# Patient Record
Sex: Female | Born: 1955 | Race: White | Hispanic: No | Smoking: Former smoker
Health system: Southern US, Community
[De-identification: ages and names within clinical notes are randomized; demographics above are authoritative.]

## PROBLEM LIST (undated history)

## (undated) DIAGNOSIS — R9431 Abnormal electrocardiogram [ECG] [EKG]: Secondary | ICD-10-CM

## (undated) DIAGNOSIS — R52 Pain, unspecified: Secondary | ICD-10-CM

## (undated) DIAGNOSIS — G9341 Metabolic encephalopathy: Secondary | ICD-10-CM

## (undated) DIAGNOSIS — K7460 Unspecified cirrhosis of liver: Secondary | ICD-10-CM

## (undated) DIAGNOSIS — G8929 Other chronic pain: Secondary | ICD-10-CM

## (undated) DIAGNOSIS — E46 Unspecified protein-calorie malnutrition: Secondary | ICD-10-CM

## (undated) DIAGNOSIS — K828 Other specified diseases of gallbladder: Secondary | ICD-10-CM

## (undated) DIAGNOSIS — E041 Nontoxic single thyroid nodule: Secondary | ICD-10-CM

## (undated) DIAGNOSIS — M1990 Unspecified osteoarthritis, unspecified site: Secondary | ICD-10-CM

## (undated) HISTORY — DX: Nontoxic single thyroid nodule: E04.1

## (undated) HISTORY — DX: Unspecified cirrhosis of liver: K74.60

## (undated) HISTORY — PX: WRIST SURGERY: SHX841

## (undated) HISTORY — PX: HERNIA REPAIR: SHX51

## (undated) HISTORY — DX: Other chronic pain: G89.29

## (undated) HISTORY — DX: Metabolic encephalopathy: G93.41

## (undated) HISTORY — DX: Abnormal electrocardiogram (ECG) (EKG): R94.31

## (undated) HISTORY — DX: Unspecified protein-calorie malnutrition: E46

## (undated) HISTORY — PX: BACK SURGERY: SHX140

## (undated) HISTORY — DX: Other specified diseases of gallbladder: K82.8

## (undated) HISTORY — PX: OTHER SURGICAL HISTORY: SHX169

---

## 2006-01-29 ENCOUNTER — Emergency Department (HOSPITAL_COMMUNITY): Admission: EM | Admit: 2006-01-29 | Discharge: 2006-01-29 | Payer: Self-pay | Admitting: Emergency Medicine

## 2006-02-03 ENCOUNTER — Emergency Department (HOSPITAL_COMMUNITY): Admission: EM | Admit: 2006-02-03 | Discharge: 2006-02-03 | Payer: Self-pay | Admitting: Emergency Medicine

## 2006-03-18 ENCOUNTER — Ambulatory Visit: Payer: Self-pay | Admitting: Internal Medicine

## 2006-03-28 ENCOUNTER — Ambulatory Visit: Payer: Self-pay | Admitting: *Deleted

## 2006-07-25 ENCOUNTER — Ambulatory Visit: Payer: Self-pay | Admitting: Internal Medicine

## 2006-09-29 ENCOUNTER — Ambulatory Visit: Payer: Self-pay | Admitting: Internal Medicine

## 2006-12-17 ENCOUNTER — Encounter (INDEPENDENT_AMBULATORY_CARE_PROVIDER_SITE_OTHER): Payer: Self-pay | Admitting: *Deleted

## 2007-03-24 ENCOUNTER — Ambulatory Visit: Payer: Self-pay | Admitting: Internal Medicine

## 2007-03-24 LAB — CONVERTED CEMR LAB
ALT: 36 units/L — ABNORMAL HIGH (ref 0–35)
Basophils Absolute: 0 10*3/uL (ref 0.0–0.1)
CO2: 26 meq/L (ref 19–32)
Calcium: 9.5 mg/dL (ref 8.4–10.5)
Chloride: 106 meq/L (ref 96–112)
Cholesterol: 168 mg/dL (ref 0–200)
HCV Genotype: 3
HCV Quantitative: 234000 intl units/mL — ABNORMAL HIGH (ref <5)
Hemoglobin: 14.6 g/dL (ref 12.0–15.0)
Lymphocytes Relative: 29 % (ref 12–46)
Neutro Abs: 4.6 10*3/uL (ref 1.7–7.7)
Neutrophils Relative %: 64 % (ref 43–77)
Platelets: 220 10*3/uL (ref 150–400)
Potassium: 4.2 meq/L (ref 3.5–5.3)
RDW: 12 % (ref 11.5–15.5)
Sodium: 143 meq/L (ref 135–145)
TSH: 0.327 microintl units/mL — ABNORMAL LOW (ref 0.350–5.50)
Total Protein: 7.6 g/dL (ref 6.0–8.3)

## 2007-04-23 ENCOUNTER — Ambulatory Visit: Payer: Self-pay | Admitting: Internal Medicine

## 2007-09-09 ENCOUNTER — Ambulatory Visit: Payer: Self-pay | Admitting: Internal Medicine

## 2007-10-28 ENCOUNTER — Ambulatory Visit: Payer: Self-pay | Admitting: Internal Medicine

## 2007-10-28 ENCOUNTER — Encounter: Payer: Self-pay | Admitting: Internal Medicine

## 2008-01-07 ENCOUNTER — Ambulatory Visit: Payer: Self-pay | Admitting: Internal Medicine

## 2008-02-04 ENCOUNTER — Ambulatory Visit: Payer: Self-pay | Admitting: Internal Medicine

## 2008-02-19 ENCOUNTER — Ambulatory Visit (HOSPITAL_COMMUNITY): Admission: RE | Admit: 2008-02-19 | Discharge: 2008-02-19 | Payer: Self-pay | Admitting: Internal Medicine

## 2008-07-27 ENCOUNTER — Ambulatory Visit: Payer: Self-pay | Admitting: Internal Medicine

## 2008-07-29 ENCOUNTER — Ambulatory Visit (HOSPITAL_COMMUNITY): Admission: RE | Admit: 2008-07-29 | Discharge: 2008-07-29 | Payer: Self-pay | Admitting: Internal Medicine

## 2008-08-28 ENCOUNTER — Emergency Department (HOSPITAL_COMMUNITY): Admission: EM | Admit: 2008-08-28 | Discharge: 2008-08-29 | Payer: Self-pay | Admitting: Emergency Medicine

## 2008-08-31 ENCOUNTER — Ambulatory Visit: Payer: Self-pay | Admitting: Internal Medicine

## 2008-12-21 ENCOUNTER — Ambulatory Visit: Payer: Self-pay | Admitting: Internal Medicine

## 2008-12-28 ENCOUNTER — Encounter: Admission: RE | Admit: 2008-12-28 | Discharge: 2008-12-28 | Payer: Self-pay | Admitting: Internal Medicine

## 2009-02-09 ENCOUNTER — Ambulatory Visit: Payer: Self-pay | Admitting: Internal Medicine

## 2009-05-10 ENCOUNTER — Ambulatory Visit: Payer: Self-pay | Admitting: Family Medicine

## 2009-08-08 ENCOUNTER — Ambulatory Visit: Payer: Self-pay | Admitting: Internal Medicine

## 2009-09-15 ENCOUNTER — Ambulatory Visit: Payer: Self-pay | Admitting: Internal Medicine

## 2009-10-16 ENCOUNTER — Ambulatory Visit: Payer: Self-pay | Admitting: Internal Medicine

## 2009-10-16 LAB — CONVERTED CEMR LAB
ALT: 38 units/L — ABNORMAL HIGH (ref 0–35)
Albumin: 5 g/dL (ref 3.5–5.2)
Alcohol, Ethyl (B): <10 mg/dL (ref 0–10)
Basophils Absolute: 0 10*3/uL (ref 0.0–0.1)
CO2: 28 meq/L (ref 19–32)
Calcium: 9.7 mg/dL (ref 8.4–10.5)
Chloride: 98 meq/L (ref 96–112)
Cholesterol: 154 mg/dL (ref 0–200)
Creatinine, Ser: 0.63 mg/dL (ref 0.40–1.20)
Hemoglobin: 15.9 g/dL — ABNORMAL HIGH (ref 12.0–15.0)
Lymphocytes Relative: 18 % (ref 12–46)
Monocytes Absolute: 0.6 10*3/uL (ref 0.1–1.0)
Neutro Abs: 6.6 10*3/uL (ref 1.7–7.7)
Neutrophils Relative %: 73 % (ref 43–77)
Potassium: 4.6 meq/L (ref 3.5–5.3)
RBC: 5.01 M/uL (ref 3.87–5.11)
RDW: 12.8 % (ref 11.5–15.5)
Total CHOL/HDL Ratio: 2.5
Total Protein: 8 g/dL (ref 6.0–8.3)

## 2009-10-26 ENCOUNTER — Encounter (INDEPENDENT_AMBULATORY_CARE_PROVIDER_SITE_OTHER): Payer: Self-pay | Admitting: Internal Medicine

## 2009-10-26 LAB — CONVERTED CEMR LAB
Amphetamine Screen, Ur: NEGATIVE
Benzodiazepines.: NEGATIVE
Cocaine Metabolites: NEGATIVE
Marijuana Metabolite: NEGATIVE
Phencyclidine (PCP): NEGATIVE

## 2009-11-03 ENCOUNTER — Ambulatory Visit: Payer: Self-pay | Admitting: Internal Medicine

## 2009-12-25 ENCOUNTER — Encounter
Admission: RE | Admit: 2009-12-25 | Discharge: 2010-03-25 | Payer: Self-pay | Source: Home / Self Care | Attending: Physical Medicine and Rehabilitation | Admitting: Physical Medicine and Rehabilitation

## 2010-01-01 ENCOUNTER — Ambulatory Visit: Payer: Self-pay | Admitting: Physical Medicine and Rehabilitation

## 2010-01-15 ENCOUNTER — Ambulatory Visit (HOSPITAL_COMMUNITY): Admission: RE | Admit: 2010-01-15 | Discharge: 2010-01-15 | Payer: Self-pay | Admitting: Obstetrics and Gynecology

## 2010-01-29 ENCOUNTER — Ambulatory Visit: Payer: Self-pay | Admitting: Physical Medicine and Rehabilitation

## 2010-02-15 ENCOUNTER — Ambulatory Visit: Payer: Self-pay | Admitting: Gastroenterology

## 2010-05-25 ENCOUNTER — Encounter (HOSPITAL_COMMUNITY)
Admission: RE | Admit: 2010-05-25 | Discharge: 2010-05-25 | Disposition: A | Discharge status: Home / Self Care | Payer: Medicaid Other | Source: Ambulatory Visit | Attending: Orthopedic Surgery | Admitting: Orthopedic Surgery

## 2010-05-25 ENCOUNTER — Other Ambulatory Visit (HOSPITAL_COMMUNITY): Payer: Self-pay | Admitting: Orthopedic Surgery

## 2010-05-25 DIAGNOSIS — Z01810 Encounter for preprocedural cardiovascular examination: Secondary | ICD-10-CM | POA: Insufficient documentation

## 2010-05-25 DIAGNOSIS — Z01812 Encounter for preprocedural laboratory examination: Secondary | ICD-10-CM | POA: Insufficient documentation

## 2010-05-25 DIAGNOSIS — Z01818 Encounter for other preprocedural examination: Secondary | ICD-10-CM | POA: Insufficient documentation

## 2010-05-25 DIAGNOSIS — Z01811 Encounter for preprocedural respiratory examination: Secondary | ICD-10-CM

## 2010-05-25 LAB — SURGICAL PCR SCREEN
MRSA, PCR: NEGATIVE
Staphylococcus aureus: NEGATIVE

## 2010-05-25 LAB — COMPREHENSIVE METABOLIC PANEL
Albumin: 4.4 g/dL (ref 3.5–5.2)
BUN: 4 mg/dL — ABNORMAL LOW (ref 6–23)
Calcium: 9.6 mg/dL (ref 8.4–10.5)
Glucose, Bld: 86 mg/dL (ref 70–99)
Potassium: 4.4 mEq/L (ref 3.5–5.1)
Sodium: 141 mEq/L (ref 135–145)
Total Protein: 7.5 g/dL (ref 6.0–8.3)

## 2010-05-25 LAB — URINALYSIS, ROUTINE W REFLEX MICROSCOPIC
Hgb urine dipstick: NEGATIVE
Ketones, ur: NEGATIVE mg/dL
Protein, ur: NEGATIVE mg/dL
Urine Glucose, Fasting: NEGATIVE mg/dL
pH: 7 (ref 5.0–8.0)

## 2010-05-25 LAB — CBC
HCT: 46 % (ref 36.0–46.0)
Hemoglobin: 16.2 g/dL — ABNORMAL HIGH (ref 12.0–15.0)
MCH: 31.8 pg (ref 26.0–34.0)
MCHC: 35.2 g/dL (ref 30.0–36.0)
RBC: 5.1 MIL/uL (ref 3.87–5.11)

## 2010-05-25 LAB — URINE MICROSCOPIC-ADD ON

## 2010-05-25 LAB — ABO/RH: ABO/RH(D): B POS

## 2010-05-25 LAB — DIFFERENTIAL
Lymphocytes Relative: 34 % (ref 12–46)
Lymphs Abs: 2.9 10*3/uL (ref 0.7–4.0)
Monocytes Absolute: 0.5 10*3/uL (ref 0.1–1.0)
Monocytes Relative: 6 % (ref 3–12)
Neutro Abs: 4.7 10*3/uL (ref 1.7–7.7)
Neutrophils Relative %: 56 % (ref 43–77)

## 2010-05-25 LAB — PROTIME-INR
INR: 0.89 (ref 0.00–1.49)
Prothrombin Time: 12.3 seconds (ref 11.6–15.2)

## 2010-05-25 LAB — TYPE AND SCREEN
ABO/RH(D): B POS
Antibody Screen: NEGATIVE

## 2010-05-25 LAB — APTT: aPTT: 28 seconds (ref 24–37)

## 2010-05-30 ENCOUNTER — Inpatient Hospital Stay (HOSPITAL_COMMUNITY): Payer: Medicaid Other

## 2010-05-30 ENCOUNTER — Inpatient Hospital Stay (HOSPITAL_COMMUNITY)
Admission: RE | Admit: 2010-05-30 | Discharge: 2010-06-01 | DRG: 460 | Disposition: A | Discharge status: Home / Self Care | Payer: Medicaid Other | Source: Ambulatory Visit | Attending: Orthopedic Surgery | Admitting: Orthopedic Surgery

## 2010-05-30 DIAGNOSIS — M48061 Spinal stenosis, lumbar region without neurogenic claudication: Principal | ICD-10-CM | POA: Diagnosis present

## 2010-06-01 NOTE — Op Note (Signed)
NAME:  Anita Price, Anita Price                ACCOUNT NO.:  [id number]  MEDICAL RECORD NO.:  [id number]           PATIENT TYPE:  I  LOCATION:  5023                         FACILITY:  [facility]  PHYSICIAN:  Estill Bamberg, MD      DATE OF BIRTH:  1955/08/09  DATE OF PROCEDURE:  05/30/2010 DATE OF DISCHARGE:                              OPERATIVE REPORT   PREOPERATIVE DIAGNOSES: 1. L4-5 spinal stenosis. 2. 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  POSTOPERATIVE DIAGNOSES: 1. L4-5 spinal stenosis. 2. 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  PROCEDURES: 1. L4-5 laminectomy, facetectomy, and foraminotomy with bilateral     decompression. 2. Posterolateral arthrodesis, L4-5. 3. Insertion of posterior nonsegmental instrumentation at L4-5. 4. Use of local autograft. 5. Intraoperative bone marrow aspiration.  SURGEON:  Estill Bamberg, MD  ASSISTANT:  None.  ANESTHESIA:  General endotracheal anesthesia.  ESTIMATED BLOOD LOSS:  250 mL.  COMPLICATIONS:  None.  DISPOSITION:  Stable.  FINDINGS:  L4-5 spinal stenosis and instability.  INSTRUMENTATION USED:  DePuy Expedium 5.5 instrumentation with 7 x 45-mm screws.  INDICATIONS FOR PROCEDURE:  Ms. Anita Price is a pleasant 55 year old female who  presented to my office with right greater than left leg in addition of back pain.  We did go forward with extensive conservative treatment plan including the physical therapy, anti-inflammatories, and addition of pain medications and epidural injections.  Of note, at the time that the patient did state that her injection did help her for approximately 2 days but her pain quickly recurred.  I did review an MRI dated April 04, 2010, which was consistent with multilevel degenerative disk disease in addition to spinal stenosis identified at the L4-5 level.  Radiographs were consistent with an angular deformity and a lateral listhesis across the L4-5 level which was diagnostic for instability.  Therefore, I had discussion with the patient  regarding going forward with an L4-5 decompression with instrumented posterolateral fusion.  The patient understood the risks and limitations of the procedure as outlined in my preoperative note.  OPERATIVE DETAILS:  On May 30, 2010, the patient was brought to Surgery, and general endotracheal anesthesia was administered.  The patient was given Ancef for antibiotic prophylaxis.  SCDs were placed. Time-out procedure was performed prior to placing the patient prone on a well-padded Nestler spinal frame.  The back was then prepped and draped in the usual sterile fashion.  I did obtain a lateral intraoperative radiograph with two 18-gauge spinal needles over the midline of the lumbar spine to help optimize the location of my incision.  I then made an incision from approximately spinous process of L3 to approximately spinous process of L5.  I subperiosteally exposed the lamina of L4 and L5, and an additional lateral intraoperative radiograph was obtained to help confirm the appropriate operative level.  I then exposed the posterolateral gutters up to the transverse processes of L4 and L5.  The posterolateral gutters were then packed with a Ray-Tec soaked with thrombin.  I then turned my attention towards the decompression aspect of the procedure.  I did remove the inferior aspect of L4 and the superior aspect of L5 spinous processes.  I  then performed a central and lateral recess and foraminal decompression in the usual manner using a series of Kerrisons.  I did use meticulous care and I was able to ensure that there was no extravasation of cerebrospinal fluid noted throughout the entire procedure.  It should be noted that there was obvious stenosis identified at the L4-5 level and there was compression of the traversing L5 nerves noted.  At the termination of the decompression, I was easily able to pass a [facility] out of the neural foramen on both the left and the right sides.   It should be noted that these facet joints were noted to be very much hypertrophic and the hypertrophy of the facet joints was removed.  The autograft obtained from the decompression aspect of the procedure was placed on the back table and cleaned to be used as autograft for the fusion aspect of the procedure. The epidural spaces in both the right and the left sides were packed with FloSeal.  I then turned my attention towards the instrumentation aspect of the procedure.  I started on the patient's right side.  I did cannulate the pedicles of L4 and L5 first using a 4-mm high-speed bur, followed by a curved gearshift probe.  I did use a ball-tip probe to confirm that there was no cortical violation.  I then cannulated the holes on the contralateral side in the same manner.  It should be noted that there was obvious rotation of the L4 vertebral body.  It was noted to be rotated rather excessively towards the patient's left side.  I did take this into account when cannulating the holes.  At this point, I copiously irrigated the wound using approximately 1.5 liters of normal saline.  The retractors were relaxed every 30 minutes in order to help optimize the perfusion of the paraspinal musculature.  At this point, I did use a 4-mm high-speed bur to decorticate the transverse processes of L4 and L5 bilaterally.  The posterior elements of the facet joints were also decorticated.  At this point, I did obtain approximately 5 mL of bone marrow aspirate from the patient's left iliac crest.  This was mixed with a 5 mL aliquot of Vitoss.  The Vitoss bone marrow aspirate and autograft mixture was used and packed into the posterolateral gutters on both the right and the left sides.  L4 and L5 screws were placed bilaterally uneventfully.  I did place 7 x 45-mm screws.  Of note, there was excessive osteoporosis identified and therefore, I did undersize the tap with a 5-mm tap prior to placing the screws.   After placing into the screws, I did perform motor evoked EMG, and there was no motor evoked EMG potential less than 17 mA.  I was very happy with the purchase of each of the screws.  I then placed a 35-mm rod on the patient's right side and a 30-mm rod on the patient's left side.  Caps were placed and final tightening procedure was performed.  Again, I was very happy with the final construct.  I did get a lateral intraoperative radiograph to confirm that the appropriate levels were fused and that the instrumentation was in the appropriate position.  I was very happy with the final construct.  I then placed a drain deep to the fascia. The fascia was closed using #1 Vicryl.  The subcutaneous layer was closed using 2-0 Vicryl, and skin was closed using 3-0 Monocryl. Benzoin and Steri-Strips were applied.  I also  closed the incision site for the iliac crest bone marrow aspirate using Monocryl and Steri- Strips. A sterile dressing was applied, and the patient was rolled supine and transferred to recovery in stable condition.  Of note, neurologic monitoring was used throughout the procedure and there were no abnormal signals identified.  All instrument counts were correct at the termination of the procedure.     Estill Bamberg, MD     MD/MEDQ  D:  05/30/2010  T:  05/31/2010  Job:  [id number]  Electronically Signed by Estill Bamberg  on 06/01/2010 08:15:21 AM

## 2010-06-08 NOTE — Discharge Summary (Signed)
  NAME:  Anita Price, Anita Price                ACCOUNT NO.:  [id number]  MEDICAL RECORD NO.:  [id number]           PATIENT TYPE:  I  LOCATION:  5023                         FACILITY:  [facility]  PHYSICIAN:  Estill Bamberg, MD      DATE OF BIRTH:  04/17/1955  DATE OF ADMISSION:  05/30/2010 DATE OF DISCHARGE:  06/01/2010                              DISCHARGE SUMMARY   ADMISSION DIAGNOSES: 1. L4-5 spinal stenosis. 2. 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 3. Status post L4-5 laminectomy and instrumented posterolateral     fusion.  ADMITTING HISTORY:  Briefly, [name] is a pleasant 55 year old female who did present to my office with signs and symptoms associated with spinal stenosis.  The patient was treated conservatively, but continued to have bilateral leg pain with a right side being more affected than the left.  We therefore had a discussion regarding going forward with an L4-5 decompression and instrumented posterolateral fusion.  The patient was therefore admitted on May 30, 2010, for the procedure outlined above.  HOSPITAL COURSE:  On May 30, 2010, the patient underwent the procedure outlined above.  The patient tolerated the procedure well and was transferred to recovery in stable condition.  A drain was placed postoperatively, and this was removed on postoperative day #1.  A PCA was also used, and this was also discontinued on postoperative day #1. The patient had physical therapy throughout her hospital stay and postoperative day #2 was felt that her pain was under adequate control and she was safe for discharge home.  Of note, the patient has radicular pain was entirely resolved both postoperatively, which was maintained throughout her hospital stay.  Her back discomfort was very minimal and well controlled on Percocet on postoperative day #2.  The decision was therefore made to discharge the patient on postoperative day #2.  DISCHARGE INSTRUCTIONS:  The patient will take 10 mg tabs of  Percocet for pain and 5 mg tabs of Valium for muscle spasms.  Her dressing will be maintained, and she will discontinue her dressing on postoperative day #5.  She can get the wound wet at that point.  She was educated on back precautions.  The patient will follow up with me in approximately 2 weeks.  She understands to contact my office with any concerns or questions prior to her office visit with me.     Estill Bamberg, MD     MD/MEDQ  D:  06/01/2010  T:  06/01/2010  Job:  [id number]  Electronically Signed by Estill Bamberg  on 06/07/2010 12:31:44 PM

## 2010-06-13 ENCOUNTER — Emergency Department (HOSPITAL_COMMUNITY): Payer: Medicaid Other

## 2010-06-13 ENCOUNTER — Emergency Department (HOSPITAL_COMMUNITY)
Admission: EM | Admit: 2010-06-13 | Discharge: 2010-06-13 | Disposition: A | Discharge status: Home / Self Care | Payer: Medicaid Other | Attending: Emergency Medicine | Admitting: Emergency Medicine

## 2010-06-13 DIAGNOSIS — M545 Low back pain, unspecified: Secondary | ICD-10-CM | POA: Insufficient documentation

## 2010-06-13 DIAGNOSIS — Z8619 Personal history of other infectious and parasitic diseases: Secondary | ICD-10-CM | POA: Insufficient documentation

## 2010-06-13 DIAGNOSIS — F17200 Nicotine dependence, unspecified, uncomplicated: Secondary | ICD-10-CM | POA: Insufficient documentation

## 2010-06-13 DIAGNOSIS — Z981 Arthrodesis status: Secondary | ICD-10-CM | POA: Insufficient documentation

## 2010-08-20 ENCOUNTER — Emergency Department (HOSPITAL_COMMUNITY)
Admission: EM | Admit: 2010-08-20 | Discharge: 2010-08-20 | Discharge status: Against Medical Advice | Payer: Medicaid Other | Attending: Emergency Medicine | Admitting: Emergency Medicine

## 2010-08-20 ENCOUNTER — Emergency Department (HOSPITAL_COMMUNITY): Payer: Medicaid Other

## 2010-08-20 ENCOUNTER — Emergency Department (HOSPITAL_COMMUNITY)
Admission: EM | Admit: 2010-08-20 | Discharge: 2010-08-20 | Disposition: A | Discharge status: Home / Self Care | Payer: Medicaid Other | Source: Home / Self Care | Attending: Emergency Medicine | Admitting: Emergency Medicine

## 2010-08-20 DIAGNOSIS — M129 Arthropathy, unspecified: Secondary | ICD-10-CM | POA: Insufficient documentation

## 2010-08-20 DIAGNOSIS — W1809XA Striking against other object with subsequent fall, initial encounter: Secondary | ICD-10-CM | POA: Insufficient documentation

## 2010-08-20 DIAGNOSIS — R4789 Other speech disturbances: Secondary | ICD-10-CM | POA: Insufficient documentation

## 2010-08-20 DIAGNOSIS — IMO0001 Reserved for inherently not codable concepts without codable children: Secondary | ICD-10-CM | POA: Insufficient documentation

## 2010-08-20 DIAGNOSIS — F410 Panic disorder [episodic paroxysmal anxiety] without agoraphobia: Secondary | ICD-10-CM | POA: Insufficient documentation

## 2010-08-20 DIAGNOSIS — R51 Headache: Secondary | ICD-10-CM | POA: Insufficient documentation

## 2010-08-20 DIAGNOSIS — M549 Dorsalgia, unspecified: Secondary | ICD-10-CM | POA: Insufficient documentation

## 2010-08-20 DIAGNOSIS — M79609 Pain in unspecified limb: Secondary | ICD-10-CM | POA: Insufficient documentation

## 2010-08-20 DIAGNOSIS — Y92009 Unspecified place in unspecified non-institutional (private) residence as the place of occurrence of the external cause: Secondary | ICD-10-CM | POA: Insufficient documentation

## 2010-08-20 DIAGNOSIS — IMO0002 Reserved for concepts with insufficient information to code with codable children: Secondary | ICD-10-CM | POA: Insufficient documentation

## 2010-08-20 DIAGNOSIS — R52 Pain, unspecified: Secondary | ICD-10-CM | POA: Insufficient documentation

## 2010-08-20 DIAGNOSIS — S0990XA Unspecified injury of head, initial encounter: Secondary | ICD-10-CM | POA: Insufficient documentation

## 2010-08-20 DIAGNOSIS — F411 Generalized anxiety disorder: Secondary | ICD-10-CM | POA: Insufficient documentation

## 2010-08-20 DIAGNOSIS — W010XXA Fall on same level from slipping, tripping and stumbling without subsequent striking against object, initial encounter: Secondary | ICD-10-CM | POA: Insufficient documentation

## 2010-08-26 ENCOUNTER — Emergency Department (HOSPITAL_COMMUNITY)
Admission: EM | Admit: 2010-08-26 | Discharge: 2010-08-28 | Disposition: A | Discharge status: Other Acute Inpatient Hospital | Payer: Medicaid Other | Source: Home / Self Care | Attending: Emergency Medicine | Admitting: Emergency Medicine

## 2010-08-26 DIAGNOSIS — R630 Anorexia: Secondary | ICD-10-CM | POA: Insufficient documentation

## 2010-08-26 DIAGNOSIS — R443 Hallucinations, unspecified: Secondary | ICD-10-CM | POA: Insufficient documentation

## 2010-08-26 DIAGNOSIS — I498 Other specified cardiac arrhythmias: Secondary | ICD-10-CM | POA: Insufficient documentation

## 2010-08-26 LAB — COMPREHENSIVE METABOLIC PANEL
ALT: 31 U/L (ref 0–35)
Albumin: 4 g/dL (ref 3.5–5.2)
Calcium: 8.7 mg/dL (ref 8.4–10.5)
GFR calc Af Amer: >60 mL/min (ref >60)
Glucose, Bld: 96 mg/dL (ref 70–99)
Sodium: 141 mEq/L (ref 135–145)
Total Protein: 6.4 g/dL (ref 6.0–8.3)

## 2010-08-26 LAB — CBC
HCT: 42 % (ref 36.0–46.0)
MCHC: 34 g/dL (ref 30.0–36.0)
Platelets: 172 10*3/uL (ref 150–400)
RDW: 12.5 % (ref 11.5–15.5)
WBC: 7.6 10*3/uL (ref 4.0–10.5)

## 2010-08-26 LAB — RAPID URINE DRUG SCREEN, HOSP PERFORMED
Amphetamines: NOT DETECTED
Barbiturates: NOT DETECTED
Benzodiazepines: POSITIVE — AB
Cocaine: NOT DETECTED
Opiates: NOT DETECTED
Tetrahydrocannabinol: NOT DETECTED

## 2010-08-26 LAB — DIFFERENTIAL
Basophils Absolute: 0 10*3/uL (ref 0.0–0.1)
Basophils Relative: 0 % (ref 0–1)
Eosinophils Absolute: 0.2 10*3/uL (ref 0.0–0.7)
Eosinophils Relative: 2 % (ref 0–5)
Monocytes Absolute: 0.5 10*3/uL (ref 0.1–1.0)

## 2010-08-26 LAB — URINALYSIS, ROUTINE W REFLEX MICROSCOPIC
Bilirubin Urine: NEGATIVE
Glucose, UA: NEGATIVE mg/dL
Hgb urine dipstick: NEGATIVE
Ketones, ur: NEGATIVE mg/dL
Protein, ur: NEGATIVE mg/dL
pH: 7 (ref 5.0–8.0)

## 2010-08-28 ENCOUNTER — Inpatient Hospital Stay (HOSPITAL_COMMUNITY)
Admission: AD | Admit: 2010-08-28 | Discharge: 2010-09-05 | DRG: 897 | Disposition: A | Discharge status: Home / Self Care | Payer: Medicaid Other | Attending: Psychiatry | Admitting: Psychiatry

## 2010-08-28 DIAGNOSIS — F411 Generalized anxiety disorder: Secondary | ICD-10-CM | POA: Diagnosis present

## 2010-08-28 DIAGNOSIS — F1994 Other psychoactive substance use, unspecified with psychoactive substance-induced mood disorder: Principal | ICD-10-CM | POA: Diagnosis present

## 2010-08-28 DIAGNOSIS — F39 Unspecified mood [affective] disorder: Secondary | ICD-10-CM

## 2010-08-28 DIAGNOSIS — M129 Arthropathy, unspecified: Secondary | ICD-10-CM | POA: Diagnosis present

## 2010-08-28 DIAGNOSIS — F1920 Other psychoactive substance dependence, uncomplicated: Secondary | ICD-10-CM | POA: Diagnosis present

## 2010-08-28 DIAGNOSIS — IMO0002 Reserved for concepts with insufficient information to code with codable children: Secondary | ICD-10-CM | POA: Diagnosis present

## 2010-08-28 LAB — BASIC METABOLIC PANEL
BUN: 8 mg/dL (ref 6–23)
Creatinine, Ser: 0.53 mg/dL (ref 0.4–1.2)
GFR calc non Af Amer: >60 mL/min (ref >60)
Glucose, Bld: 108 mg/dL — ABNORMAL HIGH (ref 70–99)

## 2010-08-29 DIAGNOSIS — F39 Unspecified mood [affective] disorder: Secondary | ICD-10-CM

## 2010-08-31 DIAGNOSIS — F29 Unspecified psychosis not due to a substance or known physiological condition: Secondary | ICD-10-CM
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
## 2010-09-13 ENCOUNTER — Emergency Department (HOSPITAL_COMMUNITY)
Admission: EM | Admit: 2010-09-13 | Discharge: 2010-09-13 | Disposition: A | Discharge status: Home / Self Care | Payer: Medicaid Other | Attending: Emergency Medicine | Admitting: Emergency Medicine

## 2010-09-13 ENCOUNTER — Emergency Department (HOSPITAL_COMMUNITY)
Admission: EM | Admit: 2010-09-13 | Discharge: 2010-09-18 | Disposition: A | Discharge status: Other Acute Inpatient Hospital | Payer: Medicaid Other | Source: Home / Self Care | Attending: Emergency Medicine | Admitting: Emergency Medicine

## 2010-09-13 DIAGNOSIS — G8929 Other chronic pain: Secondary | ICD-10-CM | POA: Insufficient documentation

## 2010-09-13 DIAGNOSIS — Z8619 Personal history of other infectious and parasitic diseases: Secondary | ICD-10-CM | POA: Insufficient documentation

## 2010-09-13 DIAGNOSIS — J449 Chronic obstructive pulmonary disease, unspecified: Secondary | ICD-10-CM | POA: Insufficient documentation

## 2010-09-13 DIAGNOSIS — M549 Dorsalgia, unspecified: Secondary | ICD-10-CM | POA: Insufficient documentation

## 2010-09-13 DIAGNOSIS — R000 Tachycardia, unspecified: Secondary | ICD-10-CM | POA: Insufficient documentation

## 2010-09-13 DIAGNOSIS — IMO0002 Reserved for concepts with insufficient information to code with codable children: Secondary | ICD-10-CM | POA: Insufficient documentation

## 2010-09-13 DIAGNOSIS — R4182 Altered mental status, unspecified: Secondary | ICD-10-CM | POA: Insufficient documentation

## 2010-09-13 DIAGNOSIS — R443 Hallucinations, unspecified: Secondary | ICD-10-CM | POA: Insufficient documentation

## 2010-09-13 DIAGNOSIS — J4489 Other specified chronic obstructive pulmonary disease: Secondary | ICD-10-CM | POA: Insufficient documentation

## 2010-09-13 DIAGNOSIS — B1920 Unspecified viral hepatitis C without hepatic coma: Secondary | ICD-10-CM | POA: Insufficient documentation

## 2010-09-13 LAB — CBC
MCH: 30 pg (ref 26.0–34.0)
MCV: 87.8 fL (ref 78.0–100.0)
Platelets: 203 10*3/uL (ref 150–400)
RBC: 4.43 MIL/uL (ref 3.87–5.11)
RDW: 13 % (ref 11.5–15.5)
WBC: 7.8 10*3/uL (ref 4.0–10.5)

## 2010-09-13 LAB — DIFFERENTIAL
Basophils Relative: 0 % (ref 0–1)
Eosinophils Absolute: 0.4 10*3/uL (ref 0.0–0.7)
Eosinophils Relative: 5 % (ref 0–5)
Lymphs Abs: 2.3 10*3/uL (ref 0.7–4.0)
Neutrophils Relative %: 60 % (ref 43–77)

## 2010-09-13 LAB — ETHANOL: Alcohol, Ethyl (B): <11 mg/dL — ABNORMAL HIGH (ref 0–10)

## 2010-09-13 LAB — BASIC METABOLIC PANEL
CO2: 24 mEq/L (ref 19–32)
Chloride: 103 mEq/L (ref 96–112)
GFR calc Af Amer: >60 mL/min (ref >60)
Potassium: 3.4 mEq/L — ABNORMAL LOW (ref 3.5–5.1)

## 2010-09-13 LAB — RAPID URINE DRUG SCREEN, HOSP PERFORMED
Amphetamines: NOT DETECTED
Benzodiazepines: POSITIVE — AB
Opiates: POSITIVE — AB

## 2010-09-13 LAB — SALICYLATE LEVEL: Salicylate Lvl: <2 mg/dL — ABNORMAL LOW (ref 2.8–20.0)

## 2010-09-13 LAB — ACETAMINOPHEN LEVEL: Acetaminophen (Tylenol), Serum: <15 ug/mL (ref 10–30)

## 2010-09-14 DIAGNOSIS — F29 Unspecified psychosis not due to a substance or known physiological condition: Secondary | ICD-10-CM

## 2010-09-15 DIAGNOSIS — F29 Unspecified psychosis not due to a substance or known physiological condition: Secondary | ICD-10-CM

## 2010-09-16 DIAGNOSIS — F29 Unspecified psychosis not due to a substance or known physiological condition: Secondary | ICD-10-CM

## 2010-09-17 DIAGNOSIS — F29 Unspecified psychosis not due to a substance or known physiological condition: Secondary | ICD-10-CM

## 2010-09-18 ENCOUNTER — Inpatient Hospital Stay (HOSPITAL_COMMUNITY)
Admission: AD | Admit: 2010-09-18 | Discharge: 2010-09-22 | DRG: 885 | Disposition: A | Discharge status: Home / Self Care | Payer: Medicaid Other | Source: Ambulatory Visit | Attending: Psychiatry | Admitting: Psychiatry

## 2010-09-18 DIAGNOSIS — Z91199 Patient's noncompliance with other medical treatment and regimen due to unspecified reason: Secondary | ICD-10-CM

## 2010-09-18 DIAGNOSIS — M479 Spondylosis, unspecified: Secondary | ICD-10-CM

## 2010-09-18 DIAGNOSIS — F1120 Opioid dependence, uncomplicated: Secondary | ICD-10-CM

## 2010-09-18 DIAGNOSIS — Z818 Family history of other mental and behavioral disorders: Secondary | ICD-10-CM

## 2010-09-18 DIAGNOSIS — M899 Disorder of bone, unspecified: Secondary | ICD-10-CM

## 2010-09-18 DIAGNOSIS — R000 Tachycardia, unspecified: Secondary | ICD-10-CM

## 2010-09-18 DIAGNOSIS — IMO0002 Reserved for concepts with insufficient information to code with codable children: Secondary | ICD-10-CM

## 2010-09-18 DIAGNOSIS — F259 Schizoaffective disorder, unspecified: Principal | ICD-10-CM

## 2010-09-18 DIAGNOSIS — Z6379 Other stressful life events affecting family and household: Secondary | ICD-10-CM

## 2010-09-18 DIAGNOSIS — F17200 Nicotine dependence, unspecified, uncomplicated: Secondary | ICD-10-CM

## 2010-09-18 DIAGNOSIS — B1920 Unspecified viral hepatitis C without hepatic coma: Secondary | ICD-10-CM

## 2010-09-18 DIAGNOSIS — Z981 Arthrodesis status: Secondary | ICD-10-CM

## 2010-09-18 DIAGNOSIS — F319 Bipolar disorder, unspecified: Secondary | ICD-10-CM

## 2010-09-18 DIAGNOSIS — M5126 Other intervertebral disc displacement, lumbar region: Secondary | ICD-10-CM

## 2010-09-18 DIAGNOSIS — J4489 Other specified chronic obstructive pulmonary disease: Secondary | ICD-10-CM

## 2010-09-18 DIAGNOSIS — Z9119 Patient's noncompliance with other medical treatment and regimen: Secondary | ICD-10-CM

## 2010-09-18 DIAGNOSIS — J449 Chronic obstructive pulmonary disease, unspecified: Secondary | ICD-10-CM

## 2010-09-19 DIAGNOSIS — F259 Schizoaffective disorder, unspecified: Secondary | ICD-10-CM

## 2010-09-19 LAB — COMPREHENSIVE METABOLIC PANEL
ALT: 38 U/L — ABNORMAL HIGH (ref 0–35)
AST: 31 U/L (ref 0–37)
Albumin: 4.2 g/dL (ref 3.5–5.2)
Alkaline Phosphatase: 68 U/L (ref 39–117)
CO2: 23 mEq/L (ref 19–32)
Chloride: 102 mEq/L (ref 96–112)
Potassium: 3.7 mEq/L (ref 3.5–5.1)
Total Bilirubin: 0.3 mg/dL (ref 0.3–1.2)
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
## 2010-11-14 ENCOUNTER — Ambulatory Visit: Payer: Medicaid Other | Attending: Orthopedic Surgery

## 2010-11-14 DIAGNOSIS — M542 Cervicalgia: Secondary | ICD-10-CM | POA: Insufficient documentation

## 2010-11-14 DIAGNOSIS — M2560 Stiffness of unspecified joint, not elsewhere classified: Secondary | ICD-10-CM | POA: Insufficient documentation

## 2010-11-14 DIAGNOSIS — IMO0001 Reserved for inherently not codable concepts without codable children: Secondary | ICD-10-CM | POA: Insufficient documentation

## 2010-11-14 DIAGNOSIS — M25539 Pain in unspecified wrist: Secondary | ICD-10-CM | POA: Insufficient documentation

## 2010-11-14 DIAGNOSIS — M25519 Pain in unspecified shoulder: Secondary | ICD-10-CM | POA: Insufficient documentation

## 2010-11-20 ENCOUNTER — Ambulatory Visit: Payer: Medicaid Other | Admitting: Physical Therapy

## 2010-11-21 ENCOUNTER — Ambulatory Visit: Payer: Medicaid Other

## 2010-11-23 ENCOUNTER — Ambulatory Visit: Payer: Medicaid Other

## 2010-11-28 ENCOUNTER — Ambulatory Visit: Payer: Medicaid Other

## 2010-12-17 ENCOUNTER — Encounter (HOSPITAL_COMMUNITY)
Admission: RE | Admit: 2010-12-17 | Discharge: 2010-12-17 | Disposition: A | Discharge status: Home / Self Care | Payer: Medicaid Other | Source: Ambulatory Visit | Attending: Orthopedic Surgery | Admitting: Orthopedic Surgery

## 2010-12-17 LAB — TYPE AND SCREEN
ABO/RH(D): B POS
Antibody Screen: NEGATIVE

## 2010-12-17 LAB — COMPREHENSIVE METABOLIC PANEL
AST: 51 U/L — ABNORMAL HIGH (ref 0–37)
Albumin: 4.3 g/dL (ref 3.5–5.2)
Alkaline Phosphatase: 76 U/L (ref 39–117)
BUN: 6 mg/dL (ref 6–23)
Creatinine, Ser: 0.64 mg/dL (ref 0.50–1.10)
Potassium: 4.1 mEq/L (ref 3.5–5.1)
Total Protein: 7.6 g/dL (ref 6.0–8.3)

## 2010-12-17 LAB — CBC
HCT: 45.2 % (ref 36.0–46.0)
Hemoglobin: 16.4 g/dL — ABNORMAL HIGH (ref 12.0–15.0)
MCH: 33.9 pg (ref 26.0–34.0)
RBC: 4.84 MIL/uL (ref 3.87–5.11)

## 2010-12-17 LAB — URINALYSIS, ROUTINE W REFLEX MICROSCOPIC
Glucose, UA: NEGATIVE mg/dL
pH: 6.5 (ref 5.0–8.0)

## 2010-12-17 LAB — URINE MICROSCOPIC-ADD ON

## 2010-12-17 LAB — DIFFERENTIAL
Basophils Relative: 1 % (ref 0–1)
Lymphocytes Relative: 27 % (ref 12–46)
Lymphs Abs: 2.2 10*3/uL (ref 0.7–4.0)
Monocytes Absolute: 0.4 10*3/uL (ref 0.1–1.0)
Monocytes Relative: 5 % (ref 3–12)
Neutro Abs: 5.3 10*3/uL (ref 1.7–7.7)
Neutrophils Relative %: 64 % (ref 43–77)

## 2010-12-17 LAB — PROTIME-INR: Prothrombin Time: 13 seconds (ref 11.6–15.2)

## 2010-12-19 ENCOUNTER — Inpatient Hospital Stay (HOSPITAL_COMMUNITY): Payer: Medicaid Other

## 2010-12-19 ENCOUNTER — Inpatient Hospital Stay (HOSPITAL_COMMUNITY)
Admission: RE | Admit: 2010-12-19 | Discharge: 2010-12-20 | DRG: 473 | Disposition: A | Discharge status: Home / Self Care | Payer: Medicaid Other | Source: Ambulatory Visit | Attending: Orthopedic Surgery | Admitting: Orthopedic Surgery

## 2010-12-19 DIAGNOSIS — F319 Bipolar disorder, unspecified: Secondary | ICD-10-CM | POA: Diagnosis present

## 2010-12-19 DIAGNOSIS — M5000 Cervical disc disorder with myelopathy, unspecified cervical region: Principal | ICD-10-CM | POA: Diagnosis present

## 2010-12-19 DIAGNOSIS — B1920 Unspecified viral hepatitis C without hepatic coma: Secondary | ICD-10-CM | POA: Diagnosis present

## 2010-12-19 DIAGNOSIS — J4489 Other specified chronic obstructive pulmonary disease: Secondary | ICD-10-CM | POA: Diagnosis present

## 2010-12-19 DIAGNOSIS — Z01812 Encounter for preprocedural laboratory examination: Secondary | ICD-10-CM

## 2010-12-19 DIAGNOSIS — I059 Rheumatic mitral valve disease, unspecified: Secondary | ICD-10-CM | POA: Diagnosis present

## 2010-12-19 DIAGNOSIS — F411 Generalized anxiety disorder: Secondary | ICD-10-CM | POA: Diagnosis present

## 2010-12-19 DIAGNOSIS — F17200 Nicotine dependence, unspecified, uncomplicated: Secondary | ICD-10-CM | POA: Diagnosis present

## 2010-12-19 DIAGNOSIS — J449 Chronic obstructive pulmonary disease, unspecified: Secondary | ICD-10-CM | POA: Diagnosis present

## 2010-12-31 NOTE — Op Note (Signed)
NAME:  Anita Price, Anita Price NO.:  [id number]  MEDICAL RECORD NO.:  [id number]  LOCATION:  3526                         FACILITY:  [facility]  PHYSICIAN:  Estill Bamberg, MD      DATE OF BIRTH:  1955/06/09  DATE OF PROCEDURE:  12/19/2010 DATE OF DISCHARGE:                              OPERATIVE REPORT   PREOPERATIVE DIAGNOSES: 1. Cervical myelopathy. 2. Degenerative disk disease C3-4, C4-5, C5-6.  POSTOPERATIVE DIAGNOSES: 1. Cervical myelopathy. 2. Degenerative disk disease C3-4, C4-5, C5-6.  PROCEDURE: 1. Anterior cervical decompression and fusion C4-5, C5-6, C6-7. 2. Placement of interbody spacer x3 (7-mm lordotic Titan interbody     spacer at C5-6, 5-mm lordotic interbody spacer at C4-5 and at C3-     4). 3. Placement of anterior instrumentation C3-C6. 4. Use of local autograft. 5. Use of allograft. 6. Intraoperative use of fluoroscopy.  SURGEON:  Estill Bamberg, MD  ASSISTANT:  Skip Mayer, PACANESTHESIA:  General endotracheal anesthesia.  COMPLICATIONS:  None.  DISPOSITION:  Stable.  INDICATIONS FOR PROCEDURE:  Briefly, Anita Price is a very pleasant 55- year-old female who is status post a lumbar decompression and fusion by me.  The patient did extremely well from that procedure but did go on to complain of bilateral arm pain involving both the right and left sides. We did go forward with conservative treatment and she then continued to have pain.  I did review an MRI which was notable for varying levels of neural foraminal stenosis at the C3-4, C4-5, and C5-6 levels.  We therefore did have a discussion regarding going forward with surgery. We did specifically discuss the risks and limitations regarding a 3- level ACDF.  Of particular note, the patient did have a single cervical epidural injections and states that she did not get any improvement from it.  I do let her know that I gently would prefer to hear the patient to get improvement, but it was  the opinion of the physician who performed the epidural injection that the steroid medication may not have traveled cephalad enough to alleviate her symptoms.  After fully understanding the risks and benefits of surgery, the patient did agree to go forward.  OPERATIVE DETAILS:  On December 19, 2010 the patient was brought to surgery and general endotracheal anesthesia was administered.  The patient was placed supine on a hospital bed which was placed into approximately 5 degrees of reverse Trendelenburg.  The neck was placed in gentle degree of extension.  SCDs were placed.  The shoulders were taped to the inferior aspect of the bed.  Antibiotics were given.  Time- out procedure was performed.  I did bring in lateral fluoroscopy in order to help optimize location of the left transverse incision.  The neck was then prepped and draped in the usual sterile fashion.  I then made a transverse incision from approximately midline to the medial border of the sternocleidomastoid muscle.  The plane between the sternocleidomastoid muscle laterally and strap muscles medially was readily identified and explored.  The anterior cervical spine was readily noted.  I did place spinal needle into the C4-5 interspace and a lateral fluoroscopic view did confirm this to be  the appropriate level. I then subperiosteally exposed the vertebral bodies of C3, C4, C5, and C6 out to the uncovertebral joints bilaterally.  I then placed a self- retaining shadow line retractor centered over the C5-6 interspace.  I then performed a preliminary diskectomy using a #15 blade knife in a series of curettes.  I then placed Caspar pins into the C5 and C6 vertebral bodies and gentle distraction was applied across the interspace.  I then gained access to the posterior longitudinal ligament using a nerve hook and then a #1 Kerrison.  I then used a #2 Kerrison to perform a thorough neuroforaminal decompression on both the right  and left sides.  At the termination of the decompression, I was easily able to pass a nerve hook out the right and left neural foramina.  I then gently prepared the endplates using a 3-mm high-speed bur in addition to a rasp.  I then placed a series of trials and I did feel that a 7-mm interbody graft would be the most appropriate fit.  A 7-mm Titan cage was selected and was packed with DBX allograft from musculoskeletal transplant foundation.  Osteophytes removed anteriorly were also placed into the interbody graft to be used as autograft to help optimize fusion success.  I was very pleased with the final press fit of the graft.  I then turned my attention towards the patient's C4-5 interspace.  A diskectomy was performed in the manner just described.  Again, the neural foramina were entirely decompressed on both the right and left sides.  The endplates were then again prepared and I did select a 5-mm lordotic Titan interbody graft.  This again was packed with autograft in addition to allograft from the musculoskeletal transplant foundation and tamped in position using fluoroscopy.  I was very happy with the final press fit.  I then turned my attention towards the C3-4 interspace.  The diskectomy was performed as previously described.  A 5-mm interbody graft was placed into the interbody space.  I then chose an appropriately sized Synthes fracture plate.  This was placed over the anterior cervical spine.  I did use lateral fluoroscopy to confirm the most appropriate placement of the plate.  I then placed a total of eight 16-mm variable angle self-drilling, self-tapping screws.  Two were placed in the vertebral body of C3, 2 in C4, 2 in C5, and 2 in C6.  I did note an excellent press fit of each of the screws.  I was very happy with the final appearance on the AP and fluoroscopic views.  At this point, the wound was copiously irrigated.  I did explore the wound to identify any bleeding  vessels.  All undue bleeding was controlled using bipolar electrocautery.  I then closed the platysma and subcutaneous layer using 2-0 Vicryl.  The skin was then closed using 4-0 Monocryl. Benzoin and Steri-Strips were applied followed by a sterile dressing. All instrument counts were correct at the termination of the procedure. A hard Aspen cervical collar was then placed.  Of note, Skip Mayer, was my assistant throughout the procedure and aided in essential retraction and suctioning required throughout the surgery.     Estill Bamberg, MD    MD/MEDQ  D:  12/19/2010  T:  12/19/2010  Job:  [id number]  Electronically Signed by Loraine Leriche Maecy Podgurski  on 12/31/2010 08:13:07 AM

## 2010-12-31 NOTE — Discharge Summary (Signed)
  NAME:  Anita Price, MATH NO.:  [id number]  MEDICAL RECORD NO.:  [id number]  LOCATION:  3526                         FACILITY:  [facility]  PHYSICIAN:  Estill Bamberg, MD      DATE OF BIRTH:  24-Feb-1956  DATE OF ADMISSION:  12/19/2010 DATE OF DISCHARGE:  12/20/2010                              DISCHARGE SUMMARY   ADMISSION DIAGNOSIS:  Cervical radiculopathy.  DISCHARGE DIAGNOSIS:  Cervical radiculopathy.  ADMITTING PHYSICIAN:  Estill Bamberg, MD  ADMISSION HISTORY:  Briefly, [name] is a very pleasant 56 year old female who presented to me initially with severe debilitating pain in her low back and in bilateral legs.  She did undergo a lumbar decompression and fusion by me.  She did extremely well from that procedure, but went on to have pain in her bilateral arms.  An MRI was reviewed and was consistent with varying degrees of neural foraminal stenosis at multiple levels.  The patient did fail conservative care. We did have a discussion regarding going forward with C3 and C6 anterior cervical decompression and fusion.  The patient was admitted on December 19, 2010, for the procedure noted above.  HOSPITAL COURSE:  On December 19, 2010, the patient was brought to the Surgery and underwent the procedure noted above.  The patient tolerated the procedure well and was transferred to recovery in stable condition. The patient was evaluated on the morning of postop day #1 by me.  Her pain was well-controlled.  Collar did fit her appropriately and she was neurovascularly intact.  She did note significant improvement in her bilateral arm pain.  She did have upper trapezial discomfort.  She was uneventfully discharged home in the morning of postop day #1.  DISCHARGE INSTRUCTIONS:  The patient will take Norco for pain and Valium for spasms.  She will wear hard-cervical collar at all times.  She will follow up in my office approximately 2 weeks after procedure for  a repeat evaluation.     Estill Bamberg, MD     MD/MEDQ  D:  12/20/2010  T:  12/20/2010  Job:  [id number]  Electronically Signed by Estill Bamberg  on 12/31/2010 08:13:05 AM

## 2011-04-04 ENCOUNTER — Encounter: Payer: Self-pay | Admitting: *Deleted

## 2011-04-04 ENCOUNTER — Emergency Department (INDEPENDENT_AMBULATORY_CARE_PROVIDER_SITE_OTHER)
Admission: EM | Admit: 2011-04-04 | Discharge: 2011-04-04 | Disposition: A | Discharge status: Home / Self Care | Payer: Medicaid Other | Source: Home / Self Care | Attending: Emergency Medicine | Admitting: Emergency Medicine

## 2011-04-04 DIAGNOSIS — N760 Acute vaginitis: Secondary | ICD-10-CM

## 2011-04-04 DIAGNOSIS — A499 Bacterial infection, unspecified: Secondary | ICD-10-CM

## 2011-04-04 DIAGNOSIS — B9689 Other specified bacterial agents as the cause of diseases classified elsewhere: Secondary | ICD-10-CM

## 2011-04-04 DIAGNOSIS — N950 Postmenopausal bleeding: Secondary | ICD-10-CM

## 2011-04-04 HISTORY — DX: Unspecified osteoarthritis, unspecified site: M19.90

## 2011-04-04 LAB — WET PREP, GENITAL
Clue Cells Wet Prep HPF POC: NONE SEEN
Trich, Wet Prep: NONE SEEN

## 2011-04-04 MED ORDER — METRONIDAZOLE 500 MG PO TABS: 500.0000 mg | ORAL_TABLET | Freq: Two times a day (BID) | ORAL | Status: AC

## 2011-04-04 NOTE — ED Notes (Signed)
pt

## 2011-04-04 NOTE — ED Notes (Signed)
Pt       Presents  With     With  Symptoms  Of  Yellowish       Vaginal  Discharge    Since  Nov           She    denys  Any  Pain   She  Is     Post  menapausal        And  She  denys   Being  Sexually  Active

## 2011-04-04 NOTE — ED Provider Notes (Signed)
History     CSN: [id number]  Arrival date & time 04/04/11  1623   First MD Initiated Contact with Patient 04/04/11 1631      Chief Complaint  Patient presents with  . Vaginal Discharge    (Consider location/radiation/quality/duration/timing/severity/associated sxs/prior treatment) HPI Comments: Anita Price is a patient at [facility]. She's had a two-month history of vaginal discharge. She's many years postmenopausal. The discharge is yellowish with some spots of blood in. She has had some pelvic pain but denies any itching or odor. Her last Pap smear was 2008. She's had no sexual activity since 2008. She called at [facility] today and was told to come here.  Patient is a 56 y.o. female presenting with vaginal discharge.  Vaginal Discharge Pertinent negatives include no abdominal pain.    Past Medical History  Diagnosis Date  . DJD (degenerative joint disease)     Past Surgical History  Procedure Date  . Hernia repair   . Btl   . Back surgery     History reviewed. No pertinent family history.  History  Substance Use Topics  . Smoking status: Current Everyday Smoker  . Smokeless tobacco: Not on file  . Alcohol Use: No    OB History    Grav Para Term Preterm Abortions TAB SAB Ect Mult Living                  Review of Systems  Constitutional: Negative for fever and chills.  Gastrointestinal: Negative for nausea, vomiting, abdominal pain and diarrhea.  Genitourinary: Positive for vaginal bleeding, vaginal discharge and vaginal pain. Negative for dysuria, urgency, frequency, hematuria, difficulty urinating, genital sores, menstrual problem, pelvic pain and dyspareunia.    Allergies  Effersyllium; Neurontin; and Tramadol  Home Medications   Current Outpatient Rx  Name Route Sig Dispense Refill  . CYCLOBENZAPRINE HCL 10 MG PO TABS Oral Take 10 mg by mouth 3 (three) times daily as needed.      Marland Kitchen HYDROCODONE-ACETAMINOPHEN 5-325 MG PO TABS  Oral Take 1 tablet by mouth every 6 (six) hours as needed.      Marland Kitchen LAMOTRIGINE 100 MG PO TABS Oral Take 100 mg by mouth daily.      Marland Kitchen METRONIDAZOLE 500 MG PO TABS Oral Take 1 tablet (500 mg total) by mouth 2 (two) times daily. 14 tablet 0    There were no vitals taken for this visit.  Physical Exam  Nursing note and vitals reviewed. Constitutional: She appears well-developed and well-nourished. No distress.  Cardiovascular: Normal rate, regular rhythm, normal heart sounds and intact distal pulses.  Exam reveals no gallop and no friction rub.   No murmur heard. Pulmonary/Chest: Effort normal and breath sounds normal. No respiratory distress. She has no wheezes. She has no rales.  Abdominal: Soft. Bowel sounds are normal. She exhibits no distension and no mass. There is no tenderness. There is no rebound and no guarding.  Genitourinary: Uterus normal. There is no rash or lesion on the right labia. There is no rash or lesion on the left labia. Uterus is not deviated, not enlarged, not fixed and not tender. Cervix exhibits no motion tenderness, no discharge and no friability. Right adnexum displays no mass and no tenderness. Left adnexum displays no mass and no tenderness. No erythema, tenderness or bleeding around the vagina. No foreign body around the vagina. Vaginal discharge found.       Pelvic exam reveals normal external genitalia. Vaginal mucosa was atrophic with many small  petechial hemorrhages. She had a thick, yellow, non-malodorous discharge. Cervix appeared normal. Was quite a bit of pain on bimanual examination. She didn't have any specific cervical motion tenderness. Uterus was small atrophic. She had moderate pain over the uterus and over the adnexa but no masses.  Skin: Skin is warm and dry. No rash noted. She is not diaphoretic.    ED Course  Procedures (including critical care time)   Results for orders placed during the hospital encounter of 04/04/11  WET PREP, GENITAL       Component Value Range   Yeast, Wet Prep NONE SEEN  NONE SEEN    Trich, Wet Prep NONE SEEN  NONE SEEN    Clue Cells, Wet Prep NONE SEEN  NONE SEEN    WBC, Wet Prep HPF POC TOO NUMEROUS TO COUNT (*) NONE SEEN      Labs Reviewed  WET PREP, GENITAL - Abnormal; Notable for the following:    WBC, Wet Prep HPF POC TOO NUMEROUS TO COUNT (*)    All other components within normal limits  GC/CHLAMYDIA PROBE AMP, GENITAL   No results found.   1. Bacterial vaginosis   2. Post-menopausal bleeding       MDM  Her discharge it may be do to bacterial vaginosis or more likely to atrophic vaginitis. I am reluctant to start her on estrogen creams since she has had some possible postmenopausal bleeding. She needs to see a gynecologist to get workup for postmenopausal bleeding, then if negative she can possibly start on some estrogen cream.        Roque Lias, MD 04/04/11 1946

## 2011-04-05 LAB — GC/CHLAMYDIA PROBE AMP, GENITAL
Chlamydia, DNA Probe: NEGATIVE
GC Probe Amp, Genital: NEGATIVE

## 2011-04-17 ENCOUNTER — Telehealth (HOSPITAL_COMMUNITY): Payer: Self-pay | Admitting: *Deleted

## 2011-04-17 NOTE — Telephone Encounter (Signed)
Message copied by Vassie Moselle on Wed Apr 17, 2011  5:01 PM ------      Message from: Lorenz Coaster, DAVID C      Created: Thu Apr 04, 2011  7:07 PM       Rosalita Chessman,            Please make appointment for patient to see Dr. Waynard Reeds for post menopausal bleeding.            Leslee Home

## 2011-05-03 ENCOUNTER — Telehealth (HOSPITAL_COMMUNITY): Payer: Self-pay | Admitting: *Deleted

## 2011-05-03 NOTE — ED Notes (Signed)
1/3 Medical follow-up requested by Dr. Lorenz Coaster for pt. to see Dr. Waynard Reeds for post menopausal bleeding. Pt. [place].  1/16 Unable to reach office @ 1655. Unable to reach pt. [phone]) or her contact G[place]a [place] [phone]). Both numbers have a fast busy signal. 2/1 I gave the office pt.'s Medicaid # and she said their Medicaid specialist will contact the pt. and call me back. I tried both numbers again and got a fast busy signal. Vassie Moselle 05/03/2011

## 2011-06-18 NOTE — ED Notes (Signed)
3/19 Unable to reach pt. or pt.'s contact by phone- fast busy signal. Letter sent. Anita Price 06/18/2011

## 2011-08-27 DIAGNOSIS — G894 Chronic pain syndrome: Secondary | ICD-10-CM | POA: Diagnosis present

## 2013-06-29 ENCOUNTER — Encounter (HOSPITAL_COMMUNITY): Payer: Self-pay | Admitting: Emergency Medicine

## 2013-06-29 ENCOUNTER — Emergency Department (INDEPENDENT_AMBULATORY_CARE_PROVIDER_SITE_OTHER): Payer: Medicare Other

## 2013-06-29 ENCOUNTER — Emergency Department (INDEPENDENT_AMBULATORY_CARE_PROVIDER_SITE_OTHER)
Admission: EM | Admit: 2013-06-29 | Discharge: 2013-06-29 | Disposition: A | Discharge status: Home / Self Care | Payer: Medicare Other | Source: Home / Self Care | Attending: Emergency Medicine | Admitting: Emergency Medicine

## 2013-06-29 DIAGNOSIS — J209 Acute bronchitis, unspecified: Secondary | ICD-10-CM | POA: Diagnosis not present

## 2013-06-29 DIAGNOSIS — B370 Candidal stomatitis: Secondary | ICD-10-CM

## 2013-06-29 HISTORY — DX: Pain, unspecified: R52

## 2013-06-29 MED ORDER — ALBUTEROL SULFATE HFA 108 (90 BASE) MCG/ACT IN AERS
INHALATION_SPRAY | RESPIRATORY_TRACT | Status: AC
  Filled 2013-06-29: qty 6.7

## 2013-06-29 MED ORDER — AMOXICILLIN 500 MG PO CAPS: 500.0000 mg | ORAL_CAPSULE | Freq: Three times a day (TID) | ORAL | Status: DC

## 2013-06-29 MED ORDER — ALBUTEROL SULFATE HFA 108 (90 BASE) MCG/ACT IN AERS
2.0000 | INHALATION_SPRAY | Freq: Four times a day (QID) | RESPIRATORY_TRACT | Status: DC
  Administered 2013-06-29: 2 via RESPIRATORY_TRACT

## 2013-06-29 MED ORDER — BENZONATATE 200 MG PO CAPS: 200.0000 mg | ORAL_CAPSULE | Freq: Three times a day (TID) | ORAL | Status: DC | PRN

## 2013-06-29 MED ORDER — ALBUTEROL SULFATE HFA 108 (90 BASE) MCG/ACT IN AERS: 2.0000 | INHALATION_SPRAY | Freq: Four times a day (QID) | RESPIRATORY_TRACT | Status: DC

## 2013-06-29 MED ORDER — PREDNISONE 20 MG PO TABS: 20.0000 mg | ORAL_TABLET | Freq: Two times a day (BID) | ORAL | Status: DC

## 2013-06-29 MED ORDER — MAGIC MOUTHWASH: 5.0000 mL | Freq: Four times a day (QID) | ORAL | Status: DC

## 2013-06-29 NOTE — ED Notes (Signed)
Patient has uri symptoms and laryngitis, green phlegm x 2, sore throat x 4 days.  Patient concerned she has pneumonia, patient has sinus drainage.

## 2013-06-29 NOTE — ED Provider Notes (Signed)
Chief Complaint   Chief Complaint  Patient presents with  . URI    History of Present Illness   Anita Price is a 58 year old female who's had a 10-12 day history of sore throat, nasal congestion with green drainage, headache, and ear congestion. She has also had cough productive green sputum, and wheezing. She denies any fever or chills. She has had no GI symptoms. Her grandchild has been sick with the same thing. The patient is a smoker. She's had no history of asthma or allergies. She has tried a steroid inhaler for the symptoms for the past 4 days.  Review of Systems   Other than as noted above, the patient denies any of the following symptoms: Systemic:  No fevers, chills, sweats, or myalgias. Eye:  No redness or discharge. ENT:  No ear pain, headache, nasal congestion, drainage, sinus pressure, or sore throat. Neck:  No neck pain, stiffness, or swollen glands. Lungs:  No cough, sputum production, hemoptysis, wheezing, chest tightness, shortness of breath or chest pain. GI:  No abdominal pain, nausea, vomiting or diarrhea.  PMFSH   Past medical history, family history, social history, meds, and allergies were reviewed. She is allergic to antidepressants. She takes pain medication management medications including Flexeril, Lyrica, and fentanyl patches. She's had chronic neck and back pain.  Physical exam   Vital signs:  BP 160/101  Pulse 104  Temp(Src) 98.1 F (36.7 C) (Oral)  Resp 24  SpO2 97% General:  Alert and oriented.  In no distress.  Skin warm and dry. Eye:  No conjunctival injection or drainage. Lids were normal. ENT:  TMs and canals were normal, without erythema or inflammation.  Nasal mucosa was clear and uncongested, without drainage.  Mucous membranes were moist.  Pharynx was erythematous with white spots on the soft palate, uvula, anterior tonsillar pillars, and posterior pharynx.  There were no oral ulcerations or lesions. Neck:  Supple, no adenopathy,  tenderness or mass. Lungs:  No respiratory distress.  She has bilateral inspiratory and expiratory wheezes, no rales or rhonchi, good breath sounds bilaterally.  Heart:  Regular rhythm, without gallops, murmers or rubs. Skin:  Clear, warm, and dry, without rash or lesions.  Radiology   Dg Chest 2 View  06/29/2013   CLINICAL DATA:  Productive cough  EXAM: CHEST  2 VIEW  COMPARISON:  Prior radiograph from 05/25/2010  FINDINGS: The cardiac and mediastinal silhouettes are stable in size and contour, and remain within normal limits.  The lungs are normally inflated. Mild diffuse peribronchial thickening noted. No airspace consolidation, pleural effusion, or pulmonary edema is identified. There is no pneumothorax.  No acute osseous abnormality identified. ACDF overlying the cervical spine partially visualized.  IMPRESSION: Mild diffuse bronchitic changes. No focal infiltrates or other acute cardiopulmonary abnormality identified.   Electronically Signed   By: Rise Mu M.D.   On: 06/29/2013 16:16   Assessment     The primary encounter diagnosis was Acute bronchitis. A diagnosis of Oral candidiasis was also pertinent to this visit.  Plan    1.  Meds:  The following meds were prescribed:   Discharge Medication List as of 06/29/2013  4:44 PM    START taking these medications   Details  albuterol (PROVENTIL HFA;VENTOLIN HFA) 108 (90 BASE) MCG/ACT inhaler Inhale 2 puffs into the lungs 4 (four) times daily., Starting 06/29/2013, Until Discontinued, Normal    Alum & Mag Hydroxide-Simeth (MAGIC MOUTHWASH) SOLN Take 5 mLs by mouth 4 (four) times daily., Starting 06/29/2013,  Until Discontinued, Normal    amoxicillin (AMOXIL) 500 MG capsule Take 1 capsule (500 mg total) by mouth 3 (three) times daily., Starting 06/29/2013, Until Discontinued, Normal    benzonatate (TESSALON) 200 MG capsule Take 1 capsule (200 mg total) by mouth 3 (three) times daily as needed for cough., Starting 06/29/2013, Until  Discontinued, Normal    predniSONE (DELTASONE) 20 MG tablet Take 1 tablet (20 mg total) by mouth 2 (two) times daily., Starting 06/29/2013, Until Discontinued, Normal        2.  Patient Education/Counseling:  The patient was given appropriate handouts, self care instructions, and instructed in symptomatic relief.  Instructed to get extra fluids, rest, and use a cool mist vaporizer.  She was strongly encouraged to quit smoking.  3.  Follow up:  The patient was told to follow up here if no better in 3 to 4 days, or sooner if becoming worse in any way, and given some red flag symptoms such as increasing fever, difficulty breathing, chest pain, or persistent vomiting which would prompt immediate return.  Follow up here as needed.      Reuben Likesavid C Rosamund Nyland, MD 06/29/13 [phone]

## 2013-06-29 NOTE — Discharge Instructions (Signed)
Acute Bronchitis °Bronchitis is inflammation of the airways that extend from the windpipe into the lungs (bronchi). The inflammation often causes mucus to develop. This leads to a cough, which is the most common symptom of bronchitis.  °In acute bronchitis, the condition usually develops suddenly and goes away over time, usually in a couple weeks. Smoking, allergies, and asthma can make bronchitis worse. Repeated episodes of bronchitis may cause further lung problems.  °CAUSES °Acute bronchitis is most often caused by the same virus that causes a cold. The virus can spread from person to person (contagious).  °SIGNS AND SYMPTOMS  °· Cough.   °· Fever.   °· Coughing up mucus.   °· Body aches.   °· Chest congestion.   °· Chills.   °· Shortness of breath.   °· Sore throat.   °DIAGNOSIS  °Acute bronchitis is usually diagnosed through a physical exam. Tests, such as chest X-rays, are sometimes done to rule out other conditions.  °TREATMENT  °Acute bronchitis usually goes away in a couple weeks. Often times, no medical treatment is necessary. Medicines are sometimes given for relief of fever or cough. Antibiotics are usually not needed but may be prescribed in certain situations. In some cases, an inhaler may be recommended to help reduce shortness of breath and control the cough. A cool mist vaporizer may also be used to help thin bronchial secretions and make it easier to clear the chest.  °HOME CARE INSTRUCTIONS °· Get plenty of rest.   °· Drink enough fluids to keep your urine clear or pale yellow (unless you have a medical condition that requires fluid restriction). Increasing fluids may help thin your secretions and will prevent dehydration.   °· Only take over-the-counter or prescription medicines as directed by your health care provider.   °· Avoid smoking and secondhand smoke. Exposure to cigarette smoke or irritating chemicals will make bronchitis worse. If you are a smoker, consider using nicotine gum or skin  patches to help control withdrawal symptoms. Quitting smoking will help your lungs heal faster.   °· Reduce the chances of another bout of acute bronchitis by washing your hands frequently, avoiding people with cold symptoms, and trying not to touch your hands to your mouth, nose, or eyes.   °· Follow up with your health care provider as directed.   °SEEK MEDICAL CARE IF: °Your symptoms do not improve after 1 week of treatment.  °SEEK IMMEDIATE MEDICAL CARE IF: °· You develop an increased fever or chills.   °· You have chest pain.   °· You have severe shortness of breath. °· You have bloody sputum.   °· You develop dehydration. °· You develop fainting. °· You develop repeated vomiting. °· You develop a severe headache. °MAKE SURE YOU:  °· Understand these instructions. °· Will watch your condition. °· Will get help right away if you are not doing well or get worse. °Document Released: 04/25/2004 Document Revised: 11/18/2012 Document Reviewed: 09/08/2012 °Exit[employer]® Patient Information ©2014 [employer]. ° ° °How to Quit Smoking ° °According to the [place]. Surgeon General, about 440,000 people in the [place] alone die from complications related to tobacco use.  More deaths occur due to cigarette smoking than illegal drug use, AIDS, car accidents, alcohol-related deaths, suicide and homicide combined.  Smoking accounts for about 30% of all cancer related deaths, including more than 80% of lung cancer deaths. Smoking has also been linked as the cause of many other diseases like heart disease, bronchitis, emphysema, stroke, and complications of pneumonia as well as causing an increased risk of miscarriage, premature births, stillbirth, infant death,   and low birth weight in infants. ° °For this reason, the [place]. Surgeon General recommends: ° °"Smoking cessation (stopping smoking) represents the single most important step that smokers can take to enhance the length and quality of their lives." ° °Why is it so hard to  Quit? ° °Tobacco products contain Nicotine which is highly addictive - probably as addictive as heroin or cocaine.  Over time, your body becomes both physically and psychologically dependent on it. Finally, attempts to quit smoking are complicated by withdrawal reactions like depression, irritability, trouble sleeping, trouble concentrating, restlessness, headaches, weight gain, and excessive fatigue as well as a lack of support.  These symptoms can last from a few days to several weeks. ° °Why should you Quit? °· Live longer and healthier °· Can improves the health of your housemates (children, spouse) °· Increases your energy and breathing ability °· Lowers risk of heart attack, stroke and cancer °· Saves money - For example, if you smoke a pack of cigarettes a day and each pack costs about $3.00, then you will save about $1,100.00 per year, about $5,500. in 5 years and $11,000.00 in 10 years. ° °What you can do: °1. Talk to your health care provider - There are many smoking cessations aids available, both prescription or over-the-counter.  Check with your doctor and pharmacist before taking any of these products to see which one is best for you.  Develop a plan with your healthcare provider which may include nicotine replacement, prescription medication and/or counseling. ° °2. Get Started - Mark a start date on your calendar.  Remove cigarettes and ashtrays from your home, car, and office.  Don’t be around other smokers.  Stop smoking…not even a puff! ° °3. Support - Talk to family, friends, and co-workers about your plan to stop smoking.  Ask them not to smoke around you. ° °4. Coping Strategies ° °The four “A”s to help during tough times: ° °a. Avoid - Avoid other smokers or places where smoking is commonplace.  Avoid alcoholic beverages as these may increase your desire to smoke °b. Alter - Change your routine.  Drink water/juices instead of alcohol or coffee.  Take a walk or visit with someone during your  coffee break.  Change your route to work. °c. Alternatives - Substitute raw vegetables like carrot sticks or celery, sugarless candy or gum for the habit of having a cigarette. °d. Activities - Start an exercise program (talk to your doctor prior to beginning any exercise program). Try out a new “hands on” hobby to distract you from smoking and to keep your hands busy like woodworking or needlepoint. ° ° Additional tips for specific withdrawal symptoms: ° ° Cravings for tobacco:  — Distract yourself  °     — Deep-breathing exercises  °     — Remember that cravings are brief  ° ° Irritability:   — Take a few slow, deep breaths °     — Soak in a hot bath °  ° Insomnia:   — Take a walk several hours before bedtime °     — Avoid caffeinated beverages after noon °     — Read a book °     — Take a warm bath °     — Banana or warm milk °  ° Increased appetite:  — Drink water or low-calorie drinks °     — Make a survival Kit: Include straws, cinnamon        sticks,  °     —   coffee stirrers, licorice, toothpicks, gum, or fresh °        vegetables °  ° Inability to concentrate: — Take a brisk walk °     — Lighten your schedule for a couple of days °     — Take more breaks ° ° Fatigue:   — Get a good night’s sleep °     — Take a nap °     — Don’t overdo it for 2-4 weeks °  ° Constipation, gas, ° stomach pain:   — Drink plenty of fluids °     — Increase fiber: fruit, raw vegetables, whole grain        cereals °     — Talk to your doctor about diet changes ° ° ° °5. Dealing with Relapses - Most relapses occur within the first 2-3 months.  This is common so don’t be discouraged.  Some people may take several attempts before they can quit smoking completely. ° °6. Reward yourself - Set-up a rewards program for every milestone, like 1st month after quitting, 3rd month after quitting, and 6th month after quitting to keep you motivated. ° °What your doctor can do: °Perform a physical exam and order diagnostic tests like laboratory  blood work and a chest X-ray.  This will help to identify health related conditions that might benefit from smoking cessation. °Review your health history to make sure there are no contraindications with specific smoking cessation aids like allergies to medications or ingredients in these medications or conflicts with your current medications. °Prescribe smoking cessation aids such as: °Over-the-counter aid:  Nicotine gum, Nicotine patch °Prescription aids:  Nicotine spray, Nicotine inhaler, or Bupropion SR (non-nicotine). °Offer or recommend individual or group counseling to support you during the initial quitting and maintenance phase of your smoking cessation program. °Offer or recommend other alternative treatments like hypnosis or acupuncture. ° °What you can expect: °Benefits from quitting smoking: °· Improved Physical Appearance - Minimizes or stops premature wrinkling of skin, bad breath, stained teeth, gum disease, clothes/hair “smoke” odors, and yellow fingernails °· Improved Daily Activities - Food tastes better, sense of smell improves, and decreases shortness of breath during ordinary activities like climbing stairs, walking, and performing light housework °· Decreased Financial Cost - From both no longer purchasing cigarettes and the health care cost for medical treatment of conditions caused by smoking. °· Health of Others - Decreases risk of exposing others to the effects of second hand smoke.  Sets an example for youth. °Benefits of Quitting according to research from the [place] Surgeon General: °· 20 minutes after:  Blood pressure lowers & Body temperature normalizes °· 8 hours after: Carbon monoxide levels begins to normalize  °· 24 hours after: Heart attack risk decreases °· 2 weeks to 3 months after:  Blood flow improves & lung function increases  °· 1 to 9 months after:  Coughing, sinus congestion, fatigue, shortness of breath decrease  °· 1 year after: Risk of developing coronary heart disease  is half that of a smoker °· 5 years after: Risk of a stroke decreases to that of a nonsmoker °· 10 years after: Risk of death due to lung cancer death is halved.  Risk of oral, throat, esophagus, bladder, kidney, and pancreatic cancer decreases. °· 15 years after: Risk of developing coronary heart disease is half that of a nonsmoker ° ° ° ° ° °References: ° °Here are references that can provide additional information and support: ° °American Cancer Society       American Heart Association (800) ACS-2345       (800) Q2878766 or (800) Q2878766 www.cancer.org       www.amhrt.Pharmacist, community [employer] of [employer]upuncture 737-722-5895 or 8200019981  (800973-362-8167 or 414-614-8462 www.lungusa.org       www.medicalacupuncture.[place]     Office on [place] for Disease Control and Prevention (800) 4-CANCER or (800) Y5278638  201-860-9187 www.cancer.gov       [employer]  Nicotine Anonymous      Smokefree.gov 661-212-0454) TRY-NICA 972 222 1468)   (912) 691-2583) 44U-QUIT (256)581-7863) www.nicotine-anonymous.org   www.smokefree.gov  Contact your doctor or pharmacist if you have specific questions about starting a smoking cessation program.

## 2013-06-30 NOTE — ED Notes (Signed)
[facility] written yesterday. Dr Teressa LowerE Corey spoke directly w pharmacist

## 2014-12-04 DIAGNOSIS — M5412 Radiculopathy, cervical region: Secondary | ICD-10-CM | POA: Insufficient documentation

## 2020-07-10 ENCOUNTER — Inpatient Hospital Stay (HOSPITAL_COMMUNITY): Payer: Medicare Other

## 2020-07-10 ENCOUNTER — Emergency Department (HOSPITAL_COMMUNITY): Payer: Medicare Other

## 2020-07-10 ENCOUNTER — Encounter (HOSPITAL_COMMUNITY): Payer: Self-pay | Admitting: Internal Medicine

## 2020-07-10 ENCOUNTER — Inpatient Hospital Stay (HOSPITAL_COMMUNITY)
Admission: EM | Admit: 2020-07-10 | Discharge: 2020-07-13 | DRG: 871 | Disposition: A | Discharge status: Home / Self Care | Payer: Medicare Other | Attending: Internal Medicine | Admitting: Internal Medicine

## 2020-07-10 DIAGNOSIS — A419 Sepsis, unspecified organism: Principal | ICD-10-CM | POA: Diagnosis present

## 2020-07-10 DIAGNOSIS — Z7289 Other problems related to lifestyle: Secondary | ICD-10-CM

## 2020-07-10 DIAGNOSIS — Z634 Disappearance and death of family member: Secondary | ICD-10-CM

## 2020-07-10 DIAGNOSIS — M549 Dorsalgia, unspecified: Secondary | ICD-10-CM | POA: Diagnosis present

## 2020-07-10 DIAGNOSIS — I10 Essential (primary) hypertension: Secondary | ICD-10-CM

## 2020-07-10 DIAGNOSIS — K766 Portal hypertension: Secondary | ICD-10-CM | POA: Diagnosis present

## 2020-07-10 DIAGNOSIS — G8929 Other chronic pain: Secondary | ICD-10-CM | POA: Diagnosis present

## 2020-07-10 DIAGNOSIS — Z79899 Other long term (current) drug therapy: Secondary | ICD-10-CM

## 2020-07-10 DIAGNOSIS — E871 Hypo-osmolality and hyponatremia: Secondary | ICD-10-CM | POA: Diagnosis present

## 2020-07-10 DIAGNOSIS — Z811 Family history of alcohol abuse and dependence: Secondary | ICD-10-CM

## 2020-07-10 DIAGNOSIS — Z885 Allergy status to narcotic agent status: Secondary | ICD-10-CM

## 2020-07-10 DIAGNOSIS — D689 Coagulation defect, unspecified: Secondary | ICD-10-CM | POA: Diagnosis present

## 2020-07-10 DIAGNOSIS — D696 Thrombocytopenia, unspecified: Secondary | ICD-10-CM | POA: Diagnosis present

## 2020-07-10 DIAGNOSIS — E43 Unspecified severe protein-calorie malnutrition: Secondary | ICD-10-CM | POA: Diagnosis present

## 2020-07-10 DIAGNOSIS — G9341 Metabolic encephalopathy: Secondary | ICD-10-CM | POA: Diagnosis present

## 2020-07-10 DIAGNOSIS — E872 Acidosis: Secondary | ICD-10-CM | POA: Diagnosis present

## 2020-07-10 DIAGNOSIS — K7460 Unspecified cirrhosis of liver: Secondary | ICD-10-CM | POA: Diagnosis present

## 2020-07-10 DIAGNOSIS — D7589 Other specified diseases of blood and blood-forming organs: Secondary | ICD-10-CM | POA: Diagnosis present

## 2020-07-10 DIAGNOSIS — K7290 Hepatic failure, unspecified without coma: Secondary | ICD-10-CM | POA: Diagnosis present

## 2020-07-10 DIAGNOSIS — R4781 Slurred speech: Secondary | ICD-10-CM | POA: Diagnosis present

## 2020-07-10 DIAGNOSIS — R410 Disorientation, unspecified: Secondary | ICD-10-CM

## 2020-07-10 DIAGNOSIS — R739 Hyperglycemia, unspecified: Secondary | ICD-10-CM | POA: Diagnosis present

## 2020-07-10 DIAGNOSIS — Z720 Tobacco use: Secondary | ICD-10-CM

## 2020-07-10 DIAGNOSIS — M159 Polyosteoarthritis, unspecified: Secondary | ICD-10-CM | POA: Diagnosis present

## 2020-07-10 DIAGNOSIS — R0902 Hypoxemia: Secondary | ICD-10-CM | POA: Diagnosis present

## 2020-07-10 DIAGNOSIS — R161 Splenomegaly, not elsewhere classified: Secondary | ICD-10-CM | POA: Diagnosis present

## 2020-07-10 DIAGNOSIS — R9431 Abnormal electrocardiogram [ECG] [EKG]: Secondary | ICD-10-CM | POA: Diagnosis present

## 2020-07-10 DIAGNOSIS — R000 Tachycardia, unspecified: Secondary | ICD-10-CM | POA: Diagnosis present

## 2020-07-10 DIAGNOSIS — I8510 Secondary esophageal varices without bleeding: Secondary | ICD-10-CM | POA: Diagnosis present

## 2020-07-10 DIAGNOSIS — Z681 Body mass index (BMI) 19 or less, adult: Secondary | ICD-10-CM

## 2020-07-10 DIAGNOSIS — B182 Chronic viral hepatitis C: Secondary | ICD-10-CM | POA: Diagnosis present

## 2020-07-10 DIAGNOSIS — Z20822 Contact with and (suspected) exposure to covid-19: Secondary | ICD-10-CM | POA: Diagnosis present

## 2020-07-10 DIAGNOSIS — J189 Pneumonia, unspecified organism: Secondary | ICD-10-CM | POA: Diagnosis present

## 2020-07-10 DIAGNOSIS — Z28310 Unvaccinated for covid-19: Secondary | ICD-10-CM

## 2020-07-10 DIAGNOSIS — E041 Nontoxic single thyroid nodule: Secondary | ICD-10-CM | POA: Diagnosis present

## 2020-07-10 DIAGNOSIS — R809 Proteinuria, unspecified: Secondary | ICD-10-CM | POA: Diagnosis present

## 2020-07-10 DIAGNOSIS — B1920 Unspecified viral hepatitis C without hepatic coma: Secondary | ICD-10-CM

## 2020-07-10 DIAGNOSIS — F17200 Nicotine dependence, unspecified, uncomplicated: Secondary | ICD-10-CM | POA: Diagnosis present

## 2020-07-10 DIAGNOSIS — R6521 Severe sepsis with septic shock: Principal | ICD-10-CM

## 2020-07-10 DIAGNOSIS — Z888 Allergy status to other drugs, medicaments and biological substances status: Secondary | ICD-10-CM

## 2020-07-10 DIAGNOSIS — R823 Hemoglobinuria: Secondary | ICD-10-CM | POA: Diagnosis present

## 2020-07-10 DIAGNOSIS — Z79891 Long term (current) use of opiate analgesic: Secondary | ICD-10-CM

## 2020-07-10 HISTORY — DX: Tobacco use: Z72.0

## 2020-07-10 HISTORY — DX: Essential (primary) hypertension: I10

## 2020-07-10 HISTORY — DX: Unspecified viral hepatitis C without hepatic coma: B19.20

## 2020-07-10 LAB — CBC WITH DIFFERENTIAL/PLATELET
Abs Immature Granulocytes: 0.07 10*3/uL (ref 0.00–0.07)
Abs Immature Granulocytes: 0.05 10*3/uL (ref 0.00–0.07)
Basophils Absolute: 0.1 10*3/uL (ref 0.0–0.1)
Basophils Absolute: 0 10*3/uL (ref 0.0–0.1)
Basophils Relative: 1 %
Basophils Relative: 0 %
Eosinophils Absolute: 0.1 10*3/uL (ref 0.0–0.5)
Eosinophils Absolute: 0 10*3/uL (ref 0.0–0.5)
Eosinophils Relative: 1 %
Eosinophils Relative: 0 %
HCT: 42.7 % (ref 36.0–46.0)
HCT: 37.9 % (ref 36.0–46.0)
Hemoglobin: 14.7 g/dL (ref 12.0–15.0)
Hemoglobin: 13.1 g/dL (ref 12.0–15.0)
Immature Granulocytes: 1 %
Immature Granulocytes: 1 %
Lymphocytes Relative: 4 %
Lymphocytes Relative: 7 %
Lymphs Abs: 0.4 10*3/uL — ABNORMAL LOW (ref 0.7–4.0)
Lymphs Abs: 0.5 10*3/uL — ABNORMAL LOW (ref 0.7–4.0)
MCH: 35.4 pg — ABNORMAL HIGH (ref 26.0–34.0)
MCH: 34.4 pg — ABNORMAL HIGH (ref 26.0–34.0)
MCHC: 34.4 g/dL (ref 30.0–36.0)
MCHC: 34.6 g/dL (ref 30.0–36.0)
MCV: 102.9 fL — ABNORMAL HIGH (ref 80.0–100.0)
MCV: 99.5 fL (ref 80.0–100.0)
Monocytes Absolute: 1.2 10*3/uL — ABNORMAL HIGH (ref 0.1–1.0)
Monocytes Absolute: 0.7 10*3/uL (ref 0.1–1.0)
Monocytes Relative: 11 %
Monocytes Relative: 9 %
Neutro Abs: 8.6 10*3/uL — ABNORMAL HIGH (ref 1.7–7.7)
Neutro Abs: 6.2 10*3/uL (ref 1.7–7.7)
Neutrophils Relative %: 82 %
Neutrophils Relative %: 83 %
Platelets: 73 10*3/uL — ABNORMAL LOW (ref 150–400)
Platelets: 61 10*3/uL — ABNORMAL LOW (ref 150–400)
RBC: 4.15 MIL/uL (ref 3.87–5.11)
RBC: 3.81 MIL/uL — ABNORMAL LOW (ref 3.87–5.11)
RDW: 13.2 % (ref 11.5–15.5)
RDW: 12.9 % (ref 11.5–15.5)
WBC: 10.3 10*3/uL (ref 4.0–10.5)
WBC: 7.4 10*3/uL (ref 4.0–10.5)
nRBC: 0 % (ref 0.0–0.2)
nRBC: 0 % (ref 0.0–0.2)

## 2020-07-10 LAB — COMPREHENSIVE METABOLIC PANEL
ALT: 37 U/L (ref 0–44)
ALT: 26 U/L (ref 0–44)
AST: 65 U/L — ABNORMAL HIGH (ref 15–41)
AST: 40 U/L (ref 15–41)
Albumin: 3.5 g/dL (ref 3.5–5.0)
Albumin: 2.9 g/dL — ABNORMAL LOW (ref 3.5–5.0)
Alkaline Phosphatase: 62 U/L (ref 38–126)
Alkaline Phosphatase: 53 U/L (ref 38–126)
Anion gap: 11 (ref 5–15)
Anion gap: 6 (ref 5–15)
BUN: 15 mg/dL (ref 8–23)
BUN: 9 mg/dL (ref 8–23)
CO2: 19 mmol/L — ABNORMAL LOW (ref 22–32)
CO2: 17 mmol/L — ABNORMAL LOW (ref 22–32)
Calcium: 8.7 mg/dL — ABNORMAL LOW (ref 8.9–10.3)
Calcium: 7.3 mg/dL — ABNORMAL LOW (ref 8.9–10.3)
Chloride: 102 mmol/L (ref 98–111)
Chloride: 111 mmol/L (ref 98–111)
Creatinine, Ser: 0.65 mg/dL (ref 0.44–1.00)
Creatinine, Ser: 0.52 mg/dL (ref 0.44–1.00)
GFR, Estimated: >60 mL/min (ref >60)
GFR, Estimated: >60 mL/min (ref >60)
Glucose, Bld: 164 mg/dL — ABNORMAL HIGH (ref 70–99)
Glucose, Bld: 110 mg/dL — ABNORMAL HIGH (ref 70–99)
Potassium: 4.5 mmol/L (ref 3.5–5.1)
Potassium: 2.9 mmol/L — ABNORMAL LOW (ref 3.5–5.1)
Sodium: 132 mmol/L — ABNORMAL LOW (ref 135–145)
Sodium: 134 mmol/L — ABNORMAL LOW (ref 135–145)
Total Bilirubin: 2.7 mg/dL — ABNORMAL HIGH (ref 0.3–1.2)
Total Bilirubin: 2.6 mg/dL — ABNORMAL HIGH (ref 0.3–1.2)
Total Protein: 6.5 g/dL (ref 6.5–8.1)
Total Protein: 5.2 g/dL — ABNORMAL LOW (ref 6.5–8.1)

## 2020-07-10 LAB — MAGNESIUM: Magnesium: 1.8 mg/dL (ref 1.7–2.4)

## 2020-07-10 LAB — URINALYSIS, ROUTINE W REFLEX MICROSCOPIC
Bacteria, UA: NONE SEEN
Bilirubin Urine: NEGATIVE
Glucose, UA: NEGATIVE mg/dL
Ketones, ur: NEGATIVE mg/dL
Leukocytes,Ua: NEGATIVE
Nitrite: NEGATIVE
Protein, ur: 30 mg/dL — AB
Specific Gravity, Urine: 1.023 (ref 1.005–1.030)
pH: 6 (ref 5.0–8.0)

## 2020-07-10 LAB — RESPIRATORY PANEL BY PCR

## 2020-07-10 LAB — I-STAT VENOUS BLOOD GAS, ED
Acid-Base Excess: 2 mmol/L (ref 0.0–2.0)
Bicarbonate: 21.8 mmol/L (ref 20.0–28.0)
Calcium, Ion: 1 mmol/L — ABNORMAL LOW (ref 1.15–1.40)
HCT: 45 % (ref 36.0–46.0)
Hemoglobin: 15.3 g/dL — ABNORMAL HIGH (ref 12.0–15.0)
O2 Saturation: 94 %
Potassium: 4.5 mmol/L (ref 3.5–5.1)
Sodium: 135 mmol/L (ref 135–145)
TCO2: 22 mmol/L (ref 22–32)
pCO2, Ven: 22.5 mmHg — ABNORMAL LOW (ref 44.0–60.0)
pH, Ven: 7.594 — ABNORMAL HIGH (ref 7.250–7.430)
pO2, Ven: 58 mmHg — ABNORMAL HIGH (ref 32.0–45.0)

## 2020-07-10 LAB — I-STAT CHEM 8, ED
BUN: 19 mg/dL (ref 8–23)
Calcium, Ion: 0.98 mmol/L — ABNORMAL LOW (ref 1.15–1.40)
Chloride: 102 mmol/L (ref 98–111)
Creatinine, Ser: 0.4 mg/dL — ABNORMAL LOW (ref 0.44–1.00)
Glucose, Bld: 169 mg/dL — ABNORMAL HIGH (ref 70–99)
HCT: 45 % (ref 36.0–46.0)
Hemoglobin: 15.3 g/dL — ABNORMAL HIGH (ref 12.0–15.0)
Potassium: 4.4 mmol/L (ref 3.5–5.1)
Sodium: 135 mmol/L (ref 135–145)
TCO2: 21 mmol/L — ABNORMAL LOW (ref 22–32)

## 2020-07-10 LAB — PROTIME-INR
INR: 1.3 — ABNORMAL HIGH (ref 0.8–1.2)
Prothrombin Time: 15.7 seconds — ABNORMAL HIGH (ref 11.4–15.2)

## 2020-07-10 LAB — FOLATE: Folate: 3.8 ng/mL — ABNORMAL LOW (ref >5.9)

## 2020-07-10 LAB — LACTIC ACID, PLASMA
Lactic Acid, Venous: 4 mmol/L (ref 0.5–1.9)
Lactic Acid, Venous: 1.6 mmol/L (ref 0.5–1.9)

## 2020-07-10 LAB — RESP PANEL BY RT-PCR (FLU A&B, COVID) ARPGX2
Influenza A by PCR: NEGATIVE
Influenza B by PCR: NEGATIVE
SARS Coronavirus 2 by RT PCR: NEGATIVE

## 2020-07-10 LAB — APTT: aPTT: 33 seconds (ref 24–36)

## 2020-07-10 LAB — AMMONIA: Ammonia: 55 umol/L — ABNORMAL HIGH (ref 9–35)

## 2020-07-10 LAB — MRSA PCR SCREENING: MRSA by PCR: POSITIVE — AB

## 2020-07-10 LAB — HEMOGLOBIN A1C
Hgb A1c MFr Bld: 4.6 % — ABNORMAL LOW (ref 4.8–5.6)
Mean Plasma Glucose: 85.32 mg/dL

## 2020-07-10 LAB — VITAMIN B12: Vitamin B-12: 323 pg/mL (ref 180–914)

## 2020-07-10 LAB — PROCALCITONIN: Procalcitonin: 2.44 ng/mL

## 2020-07-10 MED ORDER — ACETAMINOPHEN 650 MG RE SUPP: 650.0000 mg | Freq: Four times a day (QID) | RECTAL | Status: DC | PRN

## 2020-07-10 MED ORDER — SODIUM CHLORIDE 0.9 % IV SOLN
500.0000 mg | Freq: Every day | INTRAVENOUS | Status: DC
  Administered 2020-07-10 – 2020-07-12 (×3): 500 mg via INTRAVENOUS
  Filled 2020-07-10 (×3): qty 500

## 2020-07-10 MED ORDER — SODIUM CHLORIDE 0.9 % IV BOLUS (SEPSIS)
1000.0000 mL | Freq: Once | INTRAVENOUS | Status: AC
  Administered 2020-07-10: 1000 mL via INTRAVENOUS

## 2020-07-10 MED ORDER — SODIUM CHLORIDE 0.9 % IV SOLN
2.0000 g | Freq: Three times a day (TID) | INTRAVENOUS | Status: DC
  Administered 2020-07-10: 2 g via INTRAVENOUS
  Filled 2020-07-10: qty 2

## 2020-07-10 MED ORDER — ALBUTEROL SULFATE (2.5 MG/3ML) 0.083% IN NEBU
2.5000 mg | INHALATION_SOLUTION | RESPIRATORY_TRACT | Status: DC | PRN
  Filled 2020-07-10: qty 3

## 2020-07-10 MED ORDER — ACETAMINOPHEN 325 MG PO TABS: 650.0000 mg | ORAL_TABLET | Freq: Four times a day (QID) | ORAL | Status: DC | PRN

## 2020-07-10 MED ORDER — IOHEXOL 300 MG/ML  SOLN
100.0000 mL | Freq: Once | INTRAMUSCULAR | Status: AC | PRN
  Administered 2020-07-10: 100 mL via INTRAVENOUS

## 2020-07-10 MED ORDER — VANCOMYCIN HCL 500 MG/100ML IV SOLN
500.0000 mg | Freq: Two times a day (BID) | INTRAVENOUS | Status: DC
  Administered 2020-07-10: 500 mg via INTRAVENOUS
  Filled 2020-07-10 (×3): qty 100

## 2020-07-10 MED ORDER — METOPROLOL TARTRATE 5 MG/5ML IV SOLN
5.0000 mg | Freq: Once | INTRAVENOUS | Status: DC
  Filled 2020-07-10: qty 5

## 2020-07-10 MED ORDER — SODIUM CHLORIDE 0.9 % IV BOLUS
1000.0000 mL | Freq: Once | INTRAVENOUS | Status: AC
  Administered 2020-07-10: 1000 mL via INTRAVENOUS

## 2020-07-10 MED ORDER — SODIUM CHLORIDE 0.9 % IV SOLN: INTRAVENOUS | Status: AC

## 2020-07-10 MED ORDER — MAGNESIUM SULFATE 2 GM/50ML IV SOLN: 2.0000 g | Freq: Once | INTRAVENOUS | Status: DC

## 2020-07-10 MED ORDER — METOPROLOL TARTRATE 5 MG/5ML IV SOLN: 5.0000 mg | Freq: Four times a day (QID) | INTRAVENOUS | Status: DC | PRN

## 2020-07-10 MED ORDER — METRONIDAZOLE IN NACL 5-0.79 MG/ML-% IV SOLN
500.0000 mg | Freq: Once | INTRAVENOUS | Status: AC
  Administered 2020-07-10: 500 mg via INTRAVENOUS
  Filled 2020-07-10: qty 100

## 2020-07-10 MED ORDER — CHLORHEXIDINE GLUCONATE CLOTH 2 % EX PADS
6.0000 | MEDICATED_PAD | Freq: Every day | CUTANEOUS | Status: DC
  Administered 2020-07-11 – 2020-07-13 (×3): 6 via TOPICAL

## 2020-07-10 MED ORDER — VANCOMYCIN HCL 1000 MG/200ML IV SOLN
1000.0000 mg | Freq: Once | INTRAVENOUS | Status: AC
  Administered 2020-07-10: 1000 mg via INTRAVENOUS
  Filled 2020-07-10: qty 200

## 2020-07-10 MED ORDER — LOSARTAN POTASSIUM 25 MG PO TABS
25.0000 mg | ORAL_TABLET | Freq: Every day | ORAL | Status: DC
  Administered 2020-07-10: 25 mg via ORAL
  Filled 2020-07-10: qty 1

## 2020-07-10 MED ORDER — MUPIROCIN 2 % EX OINT
1.0000 "application " | TOPICAL_OINTMENT | Freq: Two times a day (BID) | CUTANEOUS | Status: DC
  Administered 2020-07-10 – 2020-07-13 (×7): 1 via NASAL
  Filled 2020-07-10: qty 22

## 2020-07-10 MED ORDER — SODIUM CHLORIDE 0.9 % IV SOLN
2.0000 g | INTRAVENOUS | Status: DC
  Administered 2020-07-10 – 2020-07-12 (×3): 2 g via INTRAVENOUS
  Filled 2020-07-10: qty 20
  Filled 2020-07-10 (×3): qty 2

## 2020-07-10 MED ORDER — IPRATROPIUM BROMIDE 0.02 % IN SOLN
0.5000 mg | Freq: Four times a day (QID) | RESPIRATORY_TRACT | Status: DC
  Administered 2020-07-10: 0.5 mg via RESPIRATORY_TRACT
  Filled 2020-07-10: qty 2.5

## 2020-07-10 MED ORDER — SODIUM CHLORIDE 0.9 % IV SOLN
2.0000 g | Freq: Once | INTRAVENOUS | Status: AC
  Administered 2020-07-10: 2 g via INTRAVENOUS
  Filled 2020-07-10: qty 2

## 2020-07-10 MED ORDER — ACETAMINOPHEN 500 MG PO TABS
1000.0000 mg | ORAL_TABLET | Freq: Once | ORAL | Status: AC
  Administered 2020-07-10: 1000 mg via ORAL
  Filled 2020-07-10: qty 2

## 2020-07-10 MED ORDER — ONDANSETRON HCL 4 MG/2ML IJ SOLN: 4.0000 mg | Freq: Four times a day (QID) | INTRAMUSCULAR | Status: DC | PRN

## 2020-07-10 MED ORDER — POTASSIUM CHLORIDE CRYS ER 20 MEQ PO TBCR
40.0000 meq | EXTENDED_RELEASE_TABLET | ORAL | Status: AC
  Administered 2020-07-10 (×2): 40 meq via ORAL
  Filled 2020-07-10 (×2): qty 2

## 2020-07-10 MED ORDER — LACTULOSE 10 GM/15ML PO SOLN
20.0000 g | Freq: Two times a day (BID) | ORAL | Status: DC
  Administered 2020-07-10 (×2): 20 g via ORAL
  Filled 2020-07-10 (×2): qty 30

## 2020-07-10 MED ORDER — ONDANSETRON HCL 4 MG PO TABS: 4.0000 mg | ORAL_TABLET | Freq: Four times a day (QID) | ORAL | Status: DC | PRN

## 2020-07-10 MED ORDER — METHYLPREDNISOLONE SODIUM SUCC 40 MG IJ SOLR
40.0000 mg | Freq: Once | INTRAMUSCULAR | Status: DC
  Filled 2020-07-10: qty 1

## 2020-07-10 MED ORDER — LACTATED RINGERS IV BOLUS (SEPSIS): 250.0000 mL | Freq: Once | INTRAVENOUS | Status: DC

## 2020-07-10 MED ORDER — METOPROLOL TARTRATE 5 MG/5ML IV SOLN: 5.0000 mg | Freq: Once | INTRAVENOUS | Status: DC

## 2020-07-10 MED ORDER — ALBUTEROL SULFATE (2.5 MG/3ML) 0.083% IN NEBU: 2.5000 mg | INHALATION_SOLUTION | RESPIRATORY_TRACT | Status: DC | PRN

## 2020-07-10 NOTE — H&P (Signed)
History and Physical    Anita Price:[id number] DOB: 1955/08/08 DOA: 07/10/2020  PCP: Jackie Plum, MD  Patient coming from: Home.  I have personally briefly reviewed patient's old medical records in [facility]  Chief Complaint: AMS.  HPI: Anita Price is a 65 y.o. female with medical history significant of DJD of multiple sites, chronic back pain, history of back surgery, chronic hepatitis C, tobacco abuse, essential hypertension who is brought to the emergency department due to altered mental status.  The patient's daughter stated that she has had gastroenteritis about 2 weeks ago and has not been the same since then.  Her appetite has been decreased.  Since Thursday, she has been confused and at times her speech has been slurred.  She seems to have improved since she received fluids and IV antibiotics, but still is disoriented to time, date and recent events.  She is unable to provide further history at this time.  ED Course: Initial vital signs were temperature 100.9 F, pulse 116, respirations 35, BP 163/83 mmHg and O2 sat 92% on room air.  The patient received acetaminophen 1000 mg p.o. x1 cefepime, vancomycin, metronidazole and 2000 mL of normal saline bolus.  Labwork: Urinalysis was amber in color, with moderate hemoglobinuria and proteinuria 30 mg/dL.  Microscopic examination was unremarkable.  CBC showed a white count of 10.3 with 82% neutrophils, hemoglobin 14.7 g/dL and platelets 73.  PT was 15.7, INR 1.3 and PTT 33.  ABG showed a pH of 7.59, PCO2 of 22.5 and PO2 of 58 mmHg.  Lactic acid was 4.0 and then 1.6 mmol/L.  CMP showed a sodium 132 and CO2 of 19 mmol/L, all other electrolytes are normal when calcium is corrected to albumin.  Glucose 164 mg/dL.  AST was 66 5 units/L and total bilirubin 2.7 mg/dL.  Imaging: Portable 1 view chest radiograph showed groundglass opacity patchy consolidations within the bilateral mid to lower lungs consistent with  pneumonia.  Review of Systems: As per HPI otherwise all other systems reviewed and are negative.  Past Medical History:  Diagnosis Date  . DJD (degenerative joint disease)   . Essential hypertension 07/10/2020  . Hepatitis C 07/10/2020  . Pain management   . Tobacco use 07/10/2020   Past Surgical History:  Procedure Laterality Date  . BACK SURGERY    . btl    . HERNIA REPAIR    . WRIST SURGERY     Social History  reports that she has been smoking. She does not have any smokeless tobacco history on file. She reports that she does not drink alcohol. No history on file for drug use.  Allergies  Allergen Reactions  . Effersyllium [Psyllium]   . Neurontin [Gabapentin]   . Tramadol    Family History  Problem Relation Age of Onset  . Scoliosis Mother    Prior to Admission medications   Medication Sig Start Date End Date Taking? Authorizing Provider  albuterol (PROVENTIL HFA;VENTOLIN HFA) 108 (90 BASE) MCG/ACT inhaler Inhale 2 puffs into the lungs 4 (four) times daily. 06/29/13   Reuben Likes, MD  Alum & Mag Hydroxide-Simeth (MAGIC MOUTHWASH) SOLN Take 5 mLs by mouth 4 (four) times daily. 06/29/13   Reuben Likes, MD  amoxicillin (AMOXIL) 500 MG capsule Take 1 capsule (500 mg total) by mouth 3 (three) times daily. 06/29/13   Reuben Likes, MD  benzonatate (TESSALON) 200 MG capsule Take 1 capsule (200 mg total) by mouth 3 (three) times daily as needed  for cough. 06/29/13   Reuben Likes, MD  cyclobenzaprine (FLEXERIL) 10 MG tablet Take 10 mg by mouth 3 (three) times daily as needed.      [provider]  diphenhydrAMINE (BENADRYL) 25 MG tablet Take 25 mg by mouth every 6 (six) hours as needed.    [provider]  HYDROcodone-acetaminophen (NORCO) 5-325 MG per tablet Take 1 tablet by mouth every 6 (six) hours as needed.      [provider]  lamoTRIgine (LAMICTAL) 100 MG tablet Take 100 mg by mouth daily.      [provider]  predniSONE  (DELTASONE) 20 MG tablet Take 1 tablet (20 mg total) by mouth 2 (two) times daily. 06/29/13   Reuben Likes, MD   Physical Exam: Vitals:   07/10/20 0200 07/10/20 0227 07/10/20 0230 07/10/20 0356  BP: (!) 161/75  (!) 168/81   Pulse: (!) 111  (!) 107   Resp: (!) 37  (!) 40   Temp:  (!) 101.3 F (38.5 C)  98.9 F (37.2 C)  TempSrc:  Oral  Oral  SpO2: 94%  94%   Weight:      Height:       Constitutional: Looks chronically ill. Eyes: PERRL, lids and conjunctivae mildly injected.  There is mild scleral icterus. ENMT: Mucous membranes are moist. Posterior pharynx clear of any exudate or lesions. Neck: normal, supple, no masses, no thyromegaly Respiratory: Tachypneic in the mid 20s.  Decreased breath sounds on bases with bibasilar crackles, bilateral rhonchi and wheezing.  No accessory muscle use.  Cardiovascular: Tachycardic with a regular rhythm at 106 bpm, no murmurs / rubs / gallops. No extremity edema. 2+ pedal pulses. No carotid bruits.  Abdomen: No distention.  Bowel sounds positive.  Soft, no tenderness, no masses palpated. No hepatosplenomegaly.  Musculoskeletal: Mild to moderate generalized weakness.  No clubbing / cyanosis. Good ROM, no contractures. Normal muscle tone.  Skin: Mildly icteric with decreased skin turgor.. Neurologic: CN 2-12 grossly intact. Sensation intact, DTR normal. Strength seems symmetric.  Psychiatric: She is alert, oriented x2, disoriented to time, date and situation.  Anxious at times.  Labs on Admission: I have personally reviewed following labs and imaging studies  CBC: Recent Labs  Lab 07/10/20 0131 07/10/20 0132 07/10/20 0135  WBC  --  10.3  --   NEUTROABS  --  8.6*  --   HGB 15.3* 14.7 15.3*  HCT 45.0 42.7 45.0  MCV  --  102.9*  --   PLT  --  73*  --     Basic Metabolic Panel: Recent Labs  Lab 07/10/20 0131 07/10/20 0132 07/10/20 0135  NA 135 132* 135  K 4.4 4.5 4.5  CL 102 102  --   CO2  --  19*  --   GLUCOSE 169* 164*  --   BUN  19 15  --   CREATININE 0.40* 0.65  --   CALCIUM  --  8.7*  --     GFR: Estimated Creatinine Clearance: 61 mL/min (by C-G formula based on SCr of 0.65 mg/dL).  Liver Function Tests: Recent Labs  Lab 07/10/20 0132  AST 65*  ALT 37  ALKPHOS 62  BILITOT 2.7*  PROT 6.5  ALBUMIN 3.5    Radiological Exams on Admission: DG Chest Port 1 View  Result Date: 07/10/2020 CLINICAL DATA:  Sepsis EXAM: PORTABLE CHEST 1 VIEW COMPARISON:  06/29/2013 FINDINGS: Ground-glass opacity and patchy consolidations within the bilateral mid to lower lungs consistent with pneumonia.  No pleural effusion. Normal heart size. No pneumothorax IMPRESSION: Ground-glass opacity and patchy consolidations within the bilateral mid to lower lungs consistent with pneumonia. Electronically Signed   By: Jasmine Pang M.D.   On: 07/10/2020 01:20    EKG: Independently reviewed.  Vent. rate 115 BPM PR interval 106 ms QRS duration 88 ms QT/QTcB 443/613 ms P-R-T axes 84 85 -84 Sinus tachycardia Right atrial enlargement Borderline right axis deviation LVH with secondary repolarization abnormality Prolonged QT interval  Assessment/Plan Principal Problem:   Sepsis due to pneumonia POA (HCC) Admit to progressive unit/inpatient. Supplemental oxygen as needed. Scheduling as needed bronchodilators. Methylprednisolone 40 mg IVP x1 dose. Continue cefepime per pharmacy. Continue vancomycin per pharmacy. Azithromycin 500 mg IVPB every 24 hours. Check sputum Gram stain, culture and sensitivity. Follow blood culture and sensitivity. Check strep pneumoniae urinary antigen.  Active Problems:   Acute metabolic encephalopathy CT head without contrast was negative. Continue treatment for pneumonia Neurochecks every 4 hours. Supportive care.    Essential hypertension Has been off treatment for a while. PRN metoprolol due to QT prolongation. Monitor blood pressure and heart rate.    Hepatitis C MELD sodium score of  17. Told her daughter to follow with hepatology. Monitor hepatic functions.    Tobacco use Nicotine replacement therapy as needed. Staff to provide tobacco cessation information. Smoking discontinuation advised (daughter/patient) Follow-up with PCP.    Macrocytosis Check B12 and folic acid level.    Hyponatremia 2ry to decreased oral intake and GI losses. Continue normal saline infusion. Received methylprednisolone earlier.    Hyperglycemia Check fasting glucose. Check hemoglobin A1c.    Prolonged QT interval Magnesium supplementation. Avoid medications that produce QT prolongation. On beta-blocker for hypertension.     DVT prophylaxis: Lovenox SQ. Code Status:   Full code. Family Communication:  Her daughter was present in the ED room. Disposition Plan:   Patient is from:  Home.  Anticipated DC to:  Home.  Anticipated DC date:  07/12/2020  Anticipated DC barriers: Clinical status.  Consults called: Admission status:  Observation/telemetry.  Severity of Illness:  High severity after presenting with altered mental status due to acute metabolic encephalopathy in the setting of sepsis secondary to pneumonia.  The patient will need to remain for treatment in the hospital for 48 to 72 hours.  Bobette Mo MD [facility]  How to contact the [facility] Attending or Consulting provider 7A - 7P or covering provider during after hours 7P -7A, for this patient?   1. Check the care team in [facility] and look for a) attending/consulting [facility] provider listed and b) the [facility] team listed 2. Log into [email or web address] and use [place]'s universal password to access. If you do not have the password, please contact the hospital operator. 3. Locate the [facility] provider you are looking for under [facility] and page to a number that you can be directly reached. 4. If you still have difficulty reaching the provider, please page the [facility] (Director on Call) for the Hospitalists listed on  amion for assistance.  07/10/2020, 4:54 AM   This document was prepared using Dragon voice recognition software and may contain some unintended transcription errors.

## 2020-07-10 NOTE — Plan of Care (Signed)

## 2020-07-10 NOTE — ED Notes (Signed)
Patient transported to Ultrasound 

## 2020-07-10 NOTE — ED Provider Notes (Signed)
[facility] EMERGENCY DEPARTMENT Provider Note  CSN: [id number] Arrival date & time: 07/10/20 0051  Chief Complaint(s) Altered Mental Status  Pt BIB EMS from home. Pt has been sick for roughly 3 weeks, per family. Progressively worse and altered since Thursday. Poor PO intake, some slurred speech, and lethargic since Thursday.   HPI EDELIN Price is a 65 y.o. female here for altered mental status for 4 days.  Patient is alert but not oriented.  Noted to be febrile tachycardic in route.  Remainder of history, ROS, and physical exam limited due to patient's condition (AMS). Additional information was obtained from EMS.  Family and on the way.  We will obtain additional history when they arrived.  Level V Caveat.    HPI  Past Medical History Past Medical History:  Diagnosis Date  . DJD (degenerative joint disease)   . Pain management    There are no problems to display for this patient.  Home Medication(s) Prior to Admission medications   Medication Sig Start Date End Date Taking? Authorizing Provider  albuterol (PROVENTIL HFA;VENTOLIN HFA) 108 (90 BASE) MCG/ACT inhaler Inhale 2 puffs into the lungs 4 (four) times daily. 06/29/13   Reuben Likes, MD  Alum & Mag Hydroxide-Simeth (MAGIC MOUTHWASH) SOLN Take 5 mLs by mouth 4 (four) times daily. 06/29/13   Reuben Likes, MD  amoxicillin (AMOXIL) 500 MG capsule Take 1 capsule (500 mg total) by mouth 3 (three) times daily. 06/29/13   Reuben Likes, MD  benzonatate (TESSALON) 200 MG capsule Take 1 capsule (200 mg total) by mouth 3 (three) times daily as needed for cough. 06/29/13   Reuben Likes, MD  cyclobenzaprine (FLEXERIL) 10 MG tablet Take 10 mg by mouth 3 (three) times daily as needed.      [provider]  diphenhydrAMINE (BENADRYL) 25 MG tablet Take 25 mg by mouth every 6 (six) hours as needed.    [provider]  HYDROcodone-acetaminophen (NORCO) 5-325 MG per tablet Take 1 tablet by mouth  every 6 (six) hours as needed.      [provider]  lamoTRIgine (LAMICTAL) 100 MG tablet Take 100 mg by mouth daily.      [provider]  predniSONE (DELTASONE) 20 MG tablet Take 1 tablet (20 mg total) by mouth 2 (two) times daily. 06/29/13   Reuben Likes, MD                                                                                                                                    Past Surgical History Past Surgical History:  Procedure Laterality Date  . BACK SURGERY    . btl    . HERNIA REPAIR    . WRIST SURGERY     Family History No family history on file.  Social History Social History   Tobacco Use  . Smoking status: Current Every Day Smoker  Substance  Use Topics  . Alcohol use: No   Allergies Effersyllium [psyllium], Neurontin [gabapentin], and Tramadol  Review of Systems Review of Systems  Unable to perform ROS: Mental status change    Physical Exam Vital Signs  I have reviewed the triage vital signs BP (!) 168/81   Pulse (!) 107   Temp (!) 101.3 F (38.5 C) (Oral)   Resp (!) 40   Ht  (1.651 m)   Wt 54.4 kg   SpO2 94%   BMI 19.97 kg/m   Physical Exam Vitals reviewed.  Constitutional:      General: She is not in acute distress.    Appearance: She is well-developed. She is not diaphoretic.  HENT:     Head: Normocephalic and atraumatic.     Nose: Nose normal.     Mouth/Throat:     Mouth: Mucous membranes are dry.  Eyes:     General: No scleral icterus.       Right eye: No discharge.        Left eye: No discharge.     Conjunctiva/sclera: Conjunctivae normal.     Pupils: Pupils are equal, round, and reactive to light.  Cardiovascular:     Rate and Rhythm: Regular rhythm. Tachycardia present.     Heart sounds: No murmur heard. No friction rub. No gallop.   Pulmonary:     Effort: Pulmonary effort is normal. No respiratory distress.     Breath sounds: Normal breath sounds. No stridor. No rales.  Abdominal:      General: There is no distension.     Palpations: Abdomen is soft.     Tenderness: There is no abdominal tenderness.  Musculoskeletal:        General: No tenderness.     Cervical back: Normal range of motion and neck supple.  Skin:    General: Skin is warm and dry.     Findings: No erythema or rash.  Neurological:     Mental Status: She is alert. She is disoriented.     ED Results and Treatments Labs (all labs ordered are listed, but only abnormal results are displayed) Labs Reviewed  LACTIC ACID, PLASMA - Abnormal; Notable for the following components:      Result Value   Lactic Acid, Venous 4.0 (*)    All other components within normal limits  COMPREHENSIVE METABOLIC PANEL - Abnormal; Notable for the following components:   Sodium 132 (*)    CO2 19 (*)    Glucose, Bld 164 (*)    Calcium 8.7 (*)    AST 65 (*)    Total Bilirubin 2.7 (*)    All other components within normal limits  CBC WITH DIFFERENTIAL/PLATELET - Abnormal; Notable for the following components:   MCV 102.9 (*)    MCH 35.4 (*)    Platelets 73 (*)    Neutro Abs 8.6 (*)    Lymphs Abs 0.4 (*)    Monocytes Absolute 1.2 (*)    All other components within normal limits  PROTIME-INR - Abnormal; Notable for the following components:   Prothrombin Time 15.7 (*)    INR 1.3 (*)    All other components within normal limits  I-STAT CHEM 8, ED - Abnormal; Notable for the following components:   Creatinine, Ser 0.40 (*)    Glucose, Bld 169 (*)    Calcium, Ion 0.98 (*)    TCO2 21 (*)    Hemoglobin 15.3 (*)    All other components within normal limits  I-STAT VENOUS BLOOD GAS, ED - Abnormal; Notable for the following components:   pH, Ven 7.594 (*)    pCO2, Ven 22.5 (*)    pO2, Ven 58.0 (*)    Calcium, Ion 1.00 (*)    Hemoglobin 15.3 (*)    All other components within normal limits  RESP PANEL BY RT-PCR (FLU A&B, COVID) ARPGX2  CULTURE, BLOOD (ROUTINE X 2)  URINE CULTURE  CULTURE, BLOOD (ROUTINE X 2) W REFLEX  TO ID PANEL  APTT  LACTIC ACID, PLASMA  URINALYSIS, ROUTINE W REFLEX MICROSCOPIC                                                                                                                         EKG  EKG Interpretation  Date/Time:  Monday July 10 2020 01:06:00 EDT Ventricular Rate:  113 PR Interval:  139 QRS Duration: 102 QT Interval:  372 QTC Calculation: 511 R Axis:   82 Text Interpretation: Sinus tachycardia Right atrial enlargement Borderline right axis deviation LVH with secondary repolarization abnormality Prolonged QT interval Confirmed by Drema Pry [phone]) on 07/10/2020 1:13:24 AM      Radiology DG Chest Port 1 View  Result Date: 07/10/2020 CLINICAL DATA:  Sepsis EXAM: PORTABLE CHEST 1 VIEW COMPARISON:  06/29/2013 FINDINGS: Ground-glass opacity and patchy consolidations within the bilateral mid to lower lungs consistent with pneumonia. No pleural effusion. Normal heart size. No pneumothorax IMPRESSION: Ground-glass opacity and patchy consolidations within the bilateral mid to lower lungs consistent with pneumonia. Electronically Signed   By: Jasmine Pang M.D.   On: 07/10/2020 01:20    Pertinent labs & imaging results that were available during my care of the patient were reviewed by me and considered in my medical decision making (see chart for details).  Medications Ordered in ED Medications  0.9 %  sodium chloride infusion ( Intravenous New Bag/Given 07/10/20 0143)  ceFEPIme (MAXIPIME) 2 g in sodium chloride 0.9 % 100 mL IVPB (0 g Intravenous Stopped 07/10/20 0211)  metroNIDAZOLE (FLAGYL) IVPB 500 mg (0 mg Intravenous Stopped 07/10/20 0258)  vancomycin (VANCOREADY) IVPB 1000 mg/200 mL (0 mg Intravenous Stopped 07/10/20 0258)  sodium chloride 0.9 % bolus 1,000 mL (0 mLs Intravenous Stopped 07/10/20 0258)  acetaminophen (TYLENOL) tablet 1,000 mg (1,000 mg Oral Given 07/10/20 0235)  sodium chloride 0.9 % bolus 1,000 mL (1,000 mLs Intravenous New Bag/Given 07/10/20 0316)  Procedures .1-3 Lead EKG Interpretation Performed by: Nira Conn, MD Authorized by: Nira Conn, MD     Interpretation: normal     ECG rate:  105   ECG rate assessment: tachycardic     Rhythm: sinus rhythm     Ectopy: none   .Critical Care Performed by: Nira Conn, MD Authorized by: Nira Conn, MD   Critical care provider statement:    Critical care time (minutes):  45   Critical care was necessary to treat or prevent imminent or life-threatening deterioration of the following conditions:  Sepsis and shock   Critical care was time spent personally by me on the following activities:  Discussions with consultants, evaluation of patient's response to treatment, examination of patient, ordering and performing treatments and interventions, ordering and review of laboratory studies, ordering and review of radiographic studies, pulse oximetry, re-evaluation of patient's condition, obtaining history from patient or surrogate, review of old charts and development of treatment plan with patient or surrogate   Care discussed with: admitting provider      (including critical care time)  Medical Decision Making / ED Course I have reviewed the nursing notes for this encounter and the patient's prior records (if available in EHR or on provided paperwork).   IA LEEB was evaluated in Emergency Department on 07/10/2020 for the symptoms described in the history of present illness. She was evaluated in the context of the global COVID-19 pandemic, which necessitated consideration that the patient might be at risk for infection with the SARS-CoV-2 virus that causes COVID-19. Institutional protocols and algorithms that pertain to the evaluation of patients at risk for COVID-19 are in a state of rapid change based on information  released by regulatory bodies including the CDC and federal and state organizations. These policies and algorithms were followed during the patient's care in the ED.  Altered mental status. Patient noted to be febrile and tachycardic. Likely infectious process. Code sepsis was initiated patient started on empiric antibiotics. IV fluid boluses also given.  On review of records, patient has a history of chronic pain disease.  None noted thyroid disease or adrenal disease. Awaiting family arrival to obtain additional history.  Chest x-ray notable for bilateral lower lobe pneumonia. No leukocytosis on CBC.  Patient does have thrombocytopenia.  No anemia or renal insufficiency that would be concerning for TTP. No significant electrolyte derangements. Lactic acid of 4.  Given additional IV fluid bolus to meet 30 cc/kg of IV fluids.  3:13 AM Patient daughter now present.  Reports that she last saw the patient on Thursday and she was at her normal state of health.  The patient lives with her grandchildren.  They called the patient's daughter tonight due to altered mental status.  Apparently this has been slowly coming on for a day.  She reports that her previous infection was a few weeks ago and was a viral-like illness with GI symptoms.  Daughter reports that patient only takes her chronic pain medicine which is 30 mg of morphine.  She also takes as needed Tylenol.  Does not take any benzos or other sedatives.  Currently awaiting urine.  We will BladderScan the patient in and out to obtain sample.  We will call to have the patient admitted.      Final Clinical Impression(s) / ED Diagnoses Final diagnoses:  Septic shock (HCC)  Community acquired bilateral lower lobe pneumonia  Thrombocytopenia (HCC)  Disorientation      This chart was dictated  using voice recognition software.  Despite best efforts to proofread,  errors can occur which can change the documentation meaning.   Nira Conn, MD 07/10/20 [phone]

## 2020-07-10 NOTE — Progress Notes (Signed)
PROGRESS NOTE    Anita Price  JXB:[id number] DOB: Nov 22, 1955 DOA: 07/10/2020 PCP: Anita Mccreedy, MD   Brief Narrative: Taken from H&P. Anita Price is a 64 y.o. female with medical history significant of DJD of multiple sites, chronic back pain, history of back surgery, chronic hepatitis C, tobacco abuse, essential hypertension who is brought to the emergency department due to altered mental status.  The patient's daughter stated that she has had gastroenteritis about 2 weeks ago and has not been the same since then.  Her appetite has been decreased.  Since Thursday, she has been confused and at times her speech has been slurred.  She seems to have improved since she received fluids and IV antibiotics, but still is disoriented to time, date and recent events. Patient was febrile on arrival at 100.9, tachycardic and tachypneic, chest x-ray concerning for multifocal pneumonia although no respiratory symptoms, elevated lactic acid at 4>1.8, t bili 2.7,INR 1.3, ammonia levels of 55, RUQ [place] with liver cirrhosis.  Calcitonin at 2.44 Initially met sepsis criteria most likely secondary to pneumonia.  Cultures pending. CT chest and abdomen ordered today for further evaluation.  Subjective: Patient was seen and examined today.  She does not know why she is here and crying, wants to go home.  Otherwise able to answer orientation questions appropriately.  She was not aware of any diagnosis about liver cirrhosis, had history of chronic hep C which was not treated.  Quit alcohol many years ago.  Has not seen a physician in years.  She denies any upper respiratory symptoms, fever, chills, nausea, vomiting or diarrhea.  She does not remember when she had her last bowel movement.  Assessment & Plan:   Principal Problem:   Sepsis due to pneumonia [facility]) Active Problems:   Essential hypertension   Hepatitis C   Acute metabolic encephalopathy   Tobacco use   Macrocytosis   Hyponatremia    Hyperglycemia   Prolonged QT interval  Acute metabolic encephalopathy/hepatic encephalopathy/liver cirrhosis/questionable multifocal pneumonia. Altered mental status most likely secondary to hepatic encephalopathy as ammonia levels were elevated with diagnosis of liver cirrhosis.  Elevated T bili and INR.  Child pug score of class B and 8, which recommend evaluation for liver transplant. TSH and A1c within normal limit.  Although chest x-ray with concern of multifocal pneumonia denies any upper respiratory symptoms, she was febrile and elevated procalcitonin but no leukocytosis. She received cefepime, vancomycin, metronidazole in ED and continued with cefepime, vancomycin and Zithromax. -Discontinue cefepime, vancomycin. -Start her on ceftriaxone -Continue Zithromax -Follow-up blood and urine cultures -Do CT chest and abdomen to look for other source of infection. -Start her on lactulose-titrate dose to have 2-3 soft bowel movements.  Thrombocytopenia.  Most likely secondary to liver disease.  No sign of active bleeding.  Platelets at 61.  No baseline as it was within normal limit 9 years ago, no blood work-up since then. -Continue to monitor  Hypertension.  Hypertension is listed in her chart with patient was not taking any medicine for a long time.  Blood pressure elevated. -Start her on losartan and monitor.   Objective: Vitals:   07/10/20 0649 07/10/20 0705 07/10/20 1102 07/10/20 1323  BP:  (!) 162/88 (!) 154/76 (!) 168/86  Pulse:  (!) 108 93 87  Resp:  (!) 30 (!) 26 (!) 26  Temp: 98.7 F (37.1 C) 97.7 F (36.5 C) 98.2 F (36.8 C) 98.3 F (36.8 C)  TempSrc: Oral Oral Oral Oral  SpO2:  94% 93% 94%  Weight:  54.4 kg    Height:  _0  (1.651 m)      Intake/Output Summary (Last 24 hours) at 07/10/2020 1449 Last data filed at 07/10/2020 0546 Gross per 24 hour  Intake 2650 ml  Output 320 ml  Net 2330 ml   Filed Weights   07/10/20 0100 07/10/20 0705  Weight: 54.4 kg 54.4  kg    Examination:  General exam: Appears calm and comfortable  Respiratory system: Clear to auscultation. Respiratory effort normal. Cardiovascular system: S1 & S2 heard, RRR. No JVD, murmurs, rubs, gallops or clicks. Gastrointestinal system: Soft, nontender, nondistended, bowel sounds positive. Central nervous system: Alert and oriented. No focal neurological deficits. Extremities: No edema, no cyanosis, pulses intact and symmetrical. Psychiatry: Judgement and insight appear normal. Mood & affect appropriate.    DVT prophylaxis: SCDs.  Patient has thrombocytopenia Code Status: Full Family Communication: Tried calling son twice with no response. Disposition Plan:  Status is: Inpatient  Remains inpatient appropriate because:Inpatient level of care appropriate due to severity of illness   Dispo: The patient is from: Home              Anticipated d/c is to: Home              Patient currently is not medically stable to d/c.   Difficult to place patient No               Level of care: Progressive  All the records are reviewed and case discussed with Care Management/Social Worker. Management plans discussed with the patient, nursing and they are in agreement.  Consultants:   None  Procedures:  Antimicrobials:  Ceftriaxone Zithromax  Data Reviewed: I have personally reviewed following labs and imaging studies  CBC: Recent Labs  Lab 07/10/20 0131 07/10/20 0132 07/10/20 0135 07/10/20 0733  WBC  --  10.3  --  7.4  NEUTROABS  --  8.6*  --  6.2  HGB 15.3* 14.7 15.3* 13.1  HCT 45.0 42.7 45.0 37.9  MCV  --  102.9*  --  99.5  PLT  --  73*  --  61*   Basic Metabolic Panel: Recent Labs  Lab 07/10/20 0131 07/10/20 0132 07/10/20 0135 07/10/20 0600 07/10/20 0855  NA 135 132* 135 134*  --   K 4.4 4.5 4.5 2.9*  --   CL 102 102  --  111  --   CO2  --  19*  --  17*  --   GLUCOSE 169* 164*  --  110*  --   BUN 19 15  --  9  --   CREATININE 0.40* 0.65  --  0.52  --    CALCIUM  --  8.7*  --  7.3*  --   MG  --   --   --   --  1.8   GFR: Estimated Creatinine Clearance: 61 mL/min (by C-G formula based on SCr of 0.52 mg/dL). Liver Function Tests: Recent Labs  Lab 07/10/20 0132 07/10/20 0600  AST 65* 40  ALT 37 26  ALKPHOS 62 53  BILITOT 2.7* 2.6*  PROT 6.5 5.2*  ALBUMIN 3.5 2.9*   No results for input(s): LIPASE, AMYLASE in the last 168 hours. Recent Labs  Lab 07/10/20 0733  AMMONIA 55*   Coagulation Profile: Recent Labs  Lab 07/10/20 0132  INR 1.3*   Cardiac Enzymes: No results for input(s): CKTOTAL, CKMB, CKMBINDEX, TROPONINI in the last 168 hours. BNP (last 3 results)  No results for input(s): PROBNP in the last 8760 hours. HbA1C: Recent Labs    07/10/20 0733  HGBA1C 4.6*   CBG: No results for input(s): GLUCAP in the last 168 hours. Lipid Profile: No results for input(s): CHOL, HDL, LDLCALC, TRIG, CHOLHDL, LDLDIRECT in the last 72 hours. Thyroid Function Tests: No results for input(s): TSH, T4TOTAL, FREET4, T3FREE, THYROIDAB in the last 72 hours. Anemia Panel: Recent Labs    07/10/20 0600 07/10/20 0733  VITAMINB12 323  --   FOLATE  --  3.8*   Sepsis Labs: Recent Labs  Lab 07/10/20 0132 07/10/20 0300 07/10/20 0855  PROCALCITON  --   --  2.44  LATICACIDVEN 4.0* 1.6  --     Recent Results (from the past 240 hour(s))  Resp Panel by RT-PCR (Flu A&B, Covid) Nasopharyngeal Swab     Status: None   Collection Time: 07/10/20  1:47 AM   Specimen: Nasopharyngeal Swab; Nasopharyngeal(NP) swabs in vial transport medium  Result Value Ref Range Status   SARS Coronavirus 2 by RT PCR NEGATIVE NEGATIVE Final    Comment: (NOTE) SARS-CoV-2 target nucleic acids are NOT DETECTED.  The SARS-CoV-2 RNA is generally detectable in upper respiratory specimens during the acute phase of infection. The lowest concentration of SARS-CoV-2 viral copies this assay can detect is 138 copies/mL. A negative result does not preclude  SARS-Cov-2 infection and should not be used as the sole basis for treatment or other patient management decisions. A negative result may occur with  improper specimen collection/handling, submission of specimen other than nasopharyngeal swab, presence of viral mutation(s) within the areas targeted by this assay, and inadequate number of viral copies(<138 copies/mL). A negative result must be combined with clinical observations, patient history, and epidemiological information. The expected result is Negative.  Fact Sheet for Patients:  EntrepreneurPulse.com.au  Fact Sheet for Healthcare Providers:  IncredibleEmployment.be  This test is no t yet approved or cleared by the [place] FDA and  has been authorized for detection and/or diagnosis of SARS-CoV-2 by FDA under an Emergency Use Authorization (EUA). This EUA will remain  in effect (meaning this test can be used) for the duration of the COVID-19 declaration under Section 564(b)(1) of the Act, 21 U.S.C.section 360bbb-3(b)(1), unless the authorization is terminated  or revoked sooner.       Influenza A by PCR NEGATIVE NEGATIVE Final   Influenza B by PCR NEGATIVE NEGATIVE Final    Comment: (NOTE) The Xpert Xpress SARS-CoV-2/FLU/RSV plus assay is intended as an aid in the diagnosis of influenza from Nasopharyngeal swab specimens and should not be used as a sole basis for treatment. Nasal washings and aspirates are unacceptable for Xpert Xpress SARS-CoV-2/FLU/RSV testing.  Fact Sheet for Patients: EntrepreneurPulse.com.au  Fact Sheet for Healthcare Providers: IncredibleEmployment.be  This test is not yet approved or cleared by the [place] FDA and has been authorized for detection and/or diagnosis of SARS-CoV-2 by FDA under an Emergency Use Authorization (EUA). This EUA will remain in effect (meaning this test can be used) for the duration of  the COVID-19 declaration under Section 564(b)(1) of the Act, 21 U.S.C. section 360bbb-3(b)(1), unless the authorization is terminated or revoked.  Performed at [facility], [place] [address] [place], [place] [address]   MRSA PCR Screening     Status: Abnormal   Collection Time: 07/10/20  8:51 AM   Specimen: Nasal Mucosa; Nasopharyngeal  Result Value Ref Range Status   MRSA by PCR POSITIVE (A) NEGATIVE Final  Comment:        The GeneXpert MRSA Assay (FDA approved for NASAL specimens only), is one component of a comprehensive MRSA colonization surveillance program. It is not intended to diagnose MRSA infection nor to guide or monitor treatment for MRSA infections. CRITICAL RESULT CALLED TO, READ BACK BY AND VERIFIED WITH: RN MARION DIXON AT [address] ON 07/10/20 Performed at [facility] [address], [place], [place] [address]      Radiology Studies: CT HEAD WO CONTRAST  Result Date: 07/10/2020 CLINICAL DATA:  Altered mental status EXAM: CT HEAD WITHOUT CONTRAST TECHNIQUE: Contiguous axial images were obtained from the base of the skull through the vertex without intravenous contrast. COMPARISON:  None. FINDINGS: Brain: Normal anatomic configuration. Parenchymal volume loss is commensurate with the patient's age. No abnormal intra or extra-axial mass lesion or fluid collection. No abnormal mass effect or midline shift. No evidence of acute intracranial hemorrhage or infarct. Ventricular size is normal. Cerebellum unremarkable. Vascular: No asymmetric hyperdense vasculature at the skull base. Skull: Intact Sinuses/Orbits: Paranasal sinuses are clear. Orbits are unremarkable. Other: Mastoid air cells and middle ear cavities are clear. IMPRESSION: No acute intracranial hemorrhage or infarct. Electronically Signed   By: Fidela Salisbury MD   On: 07/10/2020 05:23   DG Chest Port 1 View  Result Date: 07/10/2020 CLINICAL DATA:  Sepsis EXAM: PORTABLE CHEST 1 VIEW COMPARISON:   06/29/2013 FINDINGS: Ground-glass opacity and patchy consolidations within the bilateral mid to lower lungs consistent with pneumonia. No pleural effusion. Normal heart size. No pneumothorax IMPRESSION: Ground-glass opacity and patchy consolidations within the bilateral mid to lower lungs consistent with pneumonia. Electronically Signed   By: Donavan Foil M.D.   On: 07/10/2020 01:20   US Abdomen Limited RUQ (LIVER/GB)  Result Date: 07/10/2020 CLINICAL DATA:  65 year old female with hepatitis C. EXAM: ULTRASOUND ABDOMEN LIMITED RIGHT UPPER QUADRANT COMPARISON:  None. FINDINGS: Gallbladder: Dependent sludge. Gallbladder wall thickness remains normal. No shadowing stones. No pericholecystic fluid. No sonographic Murphy sign elicited. Common bile duct: Diameter: 2 mm, normal Liver: Highly nodular liver contour (image 15). Coarse echotexture. Background echogenicity within normal limits. Small round hyperechoic lesion in the right hepatic lobe measuring 7 mm on image 34. No other discrete liver lesion. Portal vein is patent on color Doppler imaging with normal direction of blood flow towards the liver. Other: Negative visible right kidney.  No free fluid. IMPRESSION: 1. Nodular, cirrhotic liver. Solitary 7 mm echogenic focus in the right hepatic lobe most suggestive of small benign hemangioma. Recommend follow-up outpatient Liver MRI (without and with contrast) to confirm. 2. Gallbladder sludge. Electronically Signed   By: Genevie Ann M.D.   On: 07/10/2020 07:06    Scheduled Meds: . [START ON 07/11/2020] Chlorhexidine Gluconate Cloth  6 each Topical Q0600  . lactulose  20 g Oral BID  . methylPREDNISolone (SOLU-MEDROL) injection  40 mg Intravenous Once  . metoprolol tartrate  5 mg Intravenous Once  . mupirocin ointment  1 application Nasal BID  . potassium chloride  40 mEq Oral Q4H   Continuous Infusions: . sodium chloride 100 mL/hr at 07/10/20 1017  . azithromycin Stopped (07/10/20 0546)  . ceFEPime  (MAXIPIME) IV 2 g (07/10/20 0918)  . magnesium sulfate bolus IVPB    . vancomycin 500 mg (07/10/20 1059)     LOS: 0 days   Time spent: 45 minutes. More than 50% of the time was spent in counseling/coordination of care  Anita Nimrod, MD [facility]  If  7PM-7AM, please contact night-coverage [email or web address]  07/10/2020, 2:49 PM   This record has been created using Systems analyst. Errors have been sought and corrected,but may not always be located. Such creation errors do not reflect on the standard of care.

## 2020-07-10 NOTE — ED Notes (Signed)
Dr. Eudelia Bunch made aware of lactic acid 2.0 and oral temp 101.3

## 2020-07-10 NOTE — Progress Notes (Signed)
RT NOTES: Pt unable to tolerate breathing treatment, she said she didn't need it and didn't want to take it. Med already administered into neb cup. Wasted med in trash.

## 2020-07-10 NOTE — ED Notes (Signed)
RN told Sharilyn Sites, PA of pt's increased temperature of 101.3 and critical lactic acid of 4.0 while Dr. Eudelia Bunch is currently unavailable. Will tell Dr. Eudelia Bunch of temperature and lactic acid when he is available.

## 2020-07-10 NOTE — ED Triage Notes (Addendum)
Pt BIB EMS from home. Pt has been sick for roughly 3 weeks, per family. Progressively worse and altered since Thursday. Poor PO intake, some slurred speech, and lethargic since Thursday.   EMS vitals: Temp 101.4 entidal 120 HR 120 RR 30 160/78 CBG 139  22G IV in R hand by EMS, NS given by EMS

## 2020-07-10 NOTE — ED Notes (Signed)
RN attempted to call report [phone] 

## 2020-07-10 NOTE — Progress Notes (Signed)
Pharmacy Antibiotic Note  Anita Price is a 65 y.o. female admitted on 07/10/2020 with AMS/sepsis.  Pharmacy has been consulted for Vancomycin and Cefepime  dosing.  Plan: Vancomycin 500 mg IV q12h Cefepime 2 g IV q8h   Height: 5\' 5"  (165.1 cm) Weight: 54.4 kg (120 lb) IBW/kg (Calculated) : 57  Temp (24hrs), Avg:101.1 F (38.4 C), Min:100.9 F (38.3 C), Max:101.3 F (38.5 C)  Recent Labs  Lab 07/10/20 0131 07/10/20 0132  WBC  --  10.3  CREATININE 0.40* 0.65  LATICACIDVEN  --  4.0*    Estimated Creatinine Clearance: 61 mL/min (by C-G formula based on SCr of 0.65 mg/dL).    Allergies  Allergen Reactions  . Effersyllium [Psyllium]   . Neurontin [Gabapentin]   . Tramadol    09/09/20 07/10/2020 3:52 AM

## 2020-07-10 NOTE — Progress Notes (Signed)
Patient unable to drink C.T. contrast. It tasted too bad. Radiology notifed.

## 2020-07-10 NOTE — Progress Notes (Signed)
Code Sepsis initiated @ 0100, Elink following.

## 2020-07-11 ENCOUNTER — Other Ambulatory Visit: Payer: Self-pay

## 2020-07-11 ENCOUNTER — Encounter (HOSPITAL_COMMUNITY): Payer: Self-pay | Admitting: Internal Medicine

## 2020-07-11 ENCOUNTER — Inpatient Hospital Stay (HOSPITAL_COMMUNITY): Payer: Medicare Other

## 2020-07-11 DIAGNOSIS — J189 Pneumonia, unspecified organism: Secondary | ICD-10-CM

## 2020-07-11 DIAGNOSIS — A419 Sepsis, unspecified organism: Principal | ICD-10-CM

## 2020-07-11 DIAGNOSIS — K7460 Unspecified cirrhosis of liver: Secondary | ICD-10-CM

## 2020-07-11 DIAGNOSIS — G9341 Metabolic encephalopathy: Secondary | ICD-10-CM

## 2020-07-11 DIAGNOSIS — B182 Chronic viral hepatitis C: Secondary | ICD-10-CM

## 2020-07-11 LAB — COMPREHENSIVE METABOLIC PANEL
ALT: 28 U/L (ref 0–44)
AST: 47 U/L — ABNORMAL HIGH (ref 15–41)
Albumin: 3.1 g/dL — ABNORMAL LOW (ref 3.5–5.0)
Alkaline Phosphatase: 101 U/L (ref 38–126)
Anion gap: 12 (ref 5–15)
BUN: 8 mg/dL (ref 8–23)
CO2: 21 mmol/L — ABNORMAL LOW (ref 22–32)
Calcium: 8.5 mg/dL — ABNORMAL LOW (ref 8.9–10.3)
Chloride: 106 mmol/L (ref 98–111)
Creatinine, Ser: 0.54 mg/dL (ref 0.44–1.00)
GFR, Estimated: >60 mL/min (ref >60)
Glucose, Bld: 137 mg/dL — ABNORMAL HIGH (ref 70–99)
Potassium: 2.9 mmol/L — ABNORMAL LOW (ref 3.5–5.1)
Sodium: 139 mmol/L (ref 135–145)
Total Bilirubin: 1.9 mg/dL — ABNORMAL HIGH (ref 0.3–1.2)
Total Protein: 6.5 g/dL (ref 6.5–8.1)

## 2020-07-11 LAB — URINE CULTURE: Culture: NO GROWTH

## 2020-07-11 LAB — CBC WITH DIFFERENTIAL/PLATELET
Abs Immature Granulocytes: 0.15 10*3/uL — ABNORMAL HIGH (ref 0.00–0.07)
Basophils Absolute: 0 10*3/uL (ref 0.0–0.1)
Basophils Relative: 0 %
Eosinophils Absolute: 0 10*3/uL (ref 0.0–0.5)
Eosinophils Relative: 0 %
HCT: 43.1 % (ref 36.0–46.0)
Hemoglobin: 15.1 g/dL — ABNORMAL HIGH (ref 12.0–15.0)
Immature Granulocytes: 2 %
Lymphocytes Relative: 6 %
Lymphs Abs: 0.6 10*3/uL — ABNORMAL LOW (ref 0.7–4.0)
MCH: 34.5 pg — ABNORMAL HIGH (ref 26.0–34.0)
MCHC: 35 g/dL (ref 30.0–36.0)
MCV: 98.4 fL (ref 80.0–100.0)
Monocytes Absolute: 0.7 10*3/uL (ref 0.1–1.0)
Monocytes Relative: 7 %
Neutro Abs: 8.3 10*3/uL — ABNORMAL HIGH (ref 1.7–7.7)
Neutrophils Relative %: 85 %
Platelets: 85 10*3/uL — ABNORMAL LOW (ref 150–400)
RBC: 4.38 MIL/uL (ref 3.87–5.11)
RDW: 12.7 % (ref 11.5–15.5)
WBC: 9.7 10*3/uL (ref 4.0–10.5)
nRBC: 0 % (ref 0.0–0.2)

## 2020-07-11 LAB — HIV ANTIBODY (ROUTINE TESTING W REFLEX): HIV Screen 4th Generation wRfx: NONREACTIVE

## 2020-07-11 LAB — HEPATITIS C VRS RNA DETECT BY PCR-QUAL: Hepatitis C Vrs RNA by PCR-Qual: POSITIVE — AB

## 2020-07-11 LAB — PROTIME-INR
INR: 1.3 — ABNORMAL HIGH (ref 0.8–1.2)
Prothrombin Time: 15.7 seconds — ABNORMAL HIGH (ref 11.4–15.2)

## 2020-07-11 LAB — HEPATITIS B SURFACE ANTIGEN: Hepatitis B Surface Ag: NONREACTIVE

## 2020-07-11 LAB — PROCALCITONIN: Procalcitonin: 2.2 ng/mL

## 2020-07-11 MED ORDER — LOSARTAN POTASSIUM 50 MG PO TABS
50.0000 mg | ORAL_TABLET | Freq: Every day | ORAL | Status: DC
  Administered 2020-07-11 – 2020-07-13 (×3): 50 mg via ORAL
  Filled 2020-07-11 (×3): qty 1

## 2020-07-11 MED ORDER — ENSURE ENLIVE PO LIQD
237.0000 mL | Freq: Two times a day (BID) | ORAL | Status: DC
  Administered 2020-07-11 – 2020-07-13 (×5): 237 mL via ORAL

## 2020-07-11 MED ORDER — POTASSIUM CHLORIDE CRYS ER 20 MEQ PO TBCR
40.0000 meq | EXTENDED_RELEASE_TABLET | ORAL | Status: AC
  Administered 2020-07-11 (×2): 40 meq via ORAL
  Filled 2020-07-11 (×2): qty 2

## 2020-07-11 MED ORDER — PREGABALIN 75 MG PO CAPS
150.0000 mg | ORAL_CAPSULE | Freq: Two times a day (BID) | ORAL | Status: DC
  Administered 2020-07-11 – 2020-07-13 (×5): 150 mg via ORAL
  Filled 2020-07-11 (×5): qty 2

## 2020-07-11 MED ORDER — LACTULOSE 10 GM/15ML PO SOLN
10.0000 g | Freq: Two times a day (BID) | ORAL | Status: DC
  Administered 2020-07-12 – 2020-07-13 (×3): 10 g via ORAL
  Filled 2020-07-11 (×3): qty 15

## 2020-07-11 MED ORDER — MAGIC MOUTHWASH
15.0000 mL | Freq: Three times a day (TID) | ORAL | Status: DC
  Administered 2020-07-11 – 2020-07-13 (×6): 15 mL via ORAL
  Filled 2020-07-11 (×8): qty 15

## 2020-07-11 MED ORDER — MAGNESIUM SULFATE 2 GM/50ML IV SOLN
2.0000 g | Freq: Once | INTRAVENOUS | Status: AC
  Administered 2020-07-11: 2 g via INTRAVENOUS
  Filled 2020-07-11: qty 50

## 2020-07-11 MED ORDER — PNEUMOCOCCAL VAC POLYVALENT 25 MCG/0.5ML IJ INJ
0.5000 mL | INJECTION | INTRAMUSCULAR | Status: DC
  Filled 2020-07-11: qty 0.5

## 2020-07-11 MED ORDER — MORPHINE SULFATE ER 30 MG PO TBCR
30.0000 mg | EXTENDED_RELEASE_TABLET | Freq: Two times a day (BID) | ORAL | Status: DC
  Administered 2020-07-11 – 2020-07-13 (×5): 30 mg via ORAL
  Filled 2020-07-11 (×5): qty 1

## 2020-07-11 NOTE — Progress Notes (Signed)
Text page Dr. Rachael Darby to review C.T. Scan and Resp. Panel done tonight.

## 2020-07-11 NOTE — Progress Notes (Signed)
PROGRESS NOTE    [name]  AYT:[id number] DOB: [birth year] DOA: 07/10/2020 PCP: Benito Mccreedy, MD   Brief Narrative: Taken from H&P. Anita Price is a 65 y.o. female with medical history significant of DJD of multiple sites, chronic back pain, history of back surgery, chronic hepatitis C, tobacco abuse, essential hypertension who is brought to the emergency department due to altered mental status.  The patient's daughter stated that she has had gastroenteritis about 2 weeks ago and has not been the same since then.  Her appetite has been decreased.  Since Thursday, she has been confused and at times her speech has been slurred.  She seems to have improved since she received fluids and IV antibiotics, but still is disoriented to time, date and recent events. Patient was febrile on arrival at 100.9, tachycardic and tachypneic, chest x-ray concerning for multifocal pneumonia although no respiratory symptoms, elevated lactic acid at 4>1.8, t bili 2.7,INR 1.3, ammonia levels of 55, RUQ [place] with liver cirrhosis.  Calcitonin at 2.44 Initially met sepsis criteria most likely secondary to pneumonia.  Cultures pending. CT chest and abdomen concerning for multifocal bilateral pneumonia, respiratory viral panel positive for coronavirus L 63, liver cirrhosis with signs of portal hypertension and esophageal varices, incidental finding of 2 cm of thyroid nodule-thyroid ultrasound ordered.  Daughter at bedside today who told me that patient is still not at her baseline, progressive worsening in her mental status for the past few weeks, progressive decline over the past couple of years with some unintentional weight loss.  Patient was never a heavy smoker but has not been drinking recently, occasionally used to take some alcohol.  Current smoker, has not seen physicians in years except pain management clinic due to financial reasons.  Her son passed away and she is taking care of her grand sons age 13 and  56. Daughter seems supportive but was very overwhelmed with all those findings as she was not aware of any liver cirrhosis.  Patient has an history of chronic hep C which was never treated.  No Covid vaccine or other preventive healthcare.  No EGD or colonoscopy, no mammography.  Subjective: Patient is seen and examined today.  Alert but oriented to self only.  Very slow response. Had multiple bowel movements overnight after taking lactulose.  Having some back pain.  Daughter at bedside, see information provided by her above.  Assessment & Plan:   Principal Problem:   Sepsis due to pneumonia [facility]) Active Problems:   Essential hypertension   Hepatitis C   Acute metabolic encephalopathy   Tobacco use   Macrocytosis   Hyponatremia   Hyperglycemia   Prolonged QT interval  Acute metabolic encephalopathy/hepatic encephalopathy/liver cirrhosis/questionable multifocal pneumonia. Altered mental status most likely secondary to hepatic encephalopathy as ammonia levels were elevated with diagnosis of liver cirrhosis.  Elevated T bili and INR.  Child pug score of class B and 8, which recommend evaluation for liver transplant. TSH and A1c within normal limit.  Although chest x-ray with concern of multifocal pneumonia denies any upper respiratory symptoms, she was febrile and elevated procalcitonin but no leukocytosis. She received cefepime, vancomycin, metronidazole in ED and continued with cefepime, vancomycin and Zithromax. -Discontinue cefepime, vancomycin. -Start her on ceftriaxone -Continue Zithromax -Follow-up blood and urine cultures-remain negative. -Continue lactulose at a little lower dose-titrate dose to have 2-3 soft bowel movements, holding for today due to 8-10 bowel movements. -I will involved GI as she might be a good candidate for liver transplant,  currently no insurance, ask social worker to help getting her Medicaid. -She will also need EGD for variceal  evaluation.  Thrombocytopenia.  Most likely secondary to liver disease.  No sign of active bleeding.  Platelets with some improvement at 85.  No baseline as it was within normal limit 9 years ago, no blood work-up since then. -Continue to monitor  Hypertension.  Hypertension is listed in her chart with patient was not taking any medicine for a long time.  Blood pressure elevated. -Start her on losartan and monitor.  Chronic back pain.  Apparently patient was on high-dose of OxyContin 30 mg every 8 hourly along with Lyrica 300 mg twice daily which was being given to her at the pain clinic for her chronic back pain. -Restart home OxyContin at twice daily dose-will need tapering of pain meds for concern of opioid withdrawal as she is taking them for a long time. -Decrease the dose of Lyrica to 150 mg twice daily -Monitor.  Protein caloric malnutrition.  Albumin at 2.9 and patient appears malnourished with muscle wasting. Body mass index is 19.97 kg/m. -Dietitian consult  Incidental finding of thyroid nodule.  CT chest with incidental finding of a 2 cm left thyroid nodule, TSH within normal limit, thyroid ultrasound recommended which was ordered.   Objective: Vitals:   07/10/20 2328 07/10/20 2330 07/11/20 0311 07/11/20 0833  BP: (!) 176/82 (!) 176/82 (!) 175/86 (!) 177/82  Pulse: 100  100 85  Resp: 20  20 (!) 24  Temp: 99.4 F (37.4 C)  99.4 F (37.4 C) 99.8 F (37.7 C)  TempSrc: Oral  Oral Oral  SpO2: 90%  93% 94%  Weight:      Height:        Intake/Output Summary (Last 24 hours) at 07/11/2020 1017 Last data filed at 07/11/2020 0400 Gross per 24 hour  Intake 160 ml  Output --  Net 160 ml   Filed Weights   07/10/20 0100 07/10/20 0705  Weight: 54.4 kg 54.4 kg    Examination:  General.  Chronically ill-appearing lady, in no acute distress. Pulmonary.  Lungs clear bilaterally, normal respiratory effort. CV.  Regular rate and rhythm, no JVD, rub or murmur. Abdomen.  Soft,  nontender, nondistended, BS positive. CNS.  Alert and oriented x3.  No focal neurologic deficit. Extremities.  No edema, no cyanosis, pulses intact and symmetrical. Psychiatry.  Judgment and insight appears normal.  DVT prophylaxis: SCDs.  Patient has thrombocytopenia Code Status: Full Family Communication: Tried calling son twice with no response. Disposition Plan:  Status is: Inpatient  Remains inpatient appropriate because:Inpatient level of care appropriate due to severity of illness   Dispo: The patient is from: Home              Anticipated d/c is to: Home              Patient currently is not medically stable to d/c.   Difficult to place patient No               Level of care: Progressive  All the records are reviewed and case discussed with Care Management/Social Worker. Management plans discussed with the patient, nursing and they are in agreement.  Consultants:   None  Procedures:  Antimicrobials:  Ceftriaxone Zithromax  Data Reviewed: I have personally reviewed following labs and imaging studies  CBC: Recent Labs  Lab 07/10/20 0131 07/10/20 0132 07/10/20 0135 07/10/20 0733 07/11/20 0729  WBC  --  10.3  --  7.4  9.7  NEUTROABS  --  8.6*  --  6.2 8.3*  HGB 15.3* 14.7 15.3* 13.1 15.1*  HCT 45.0 42.7 45.0 37.9 43.1  MCV  --  102.9*  --  99.5 98.4  PLT  --  73*  --  61* 85*   Basic Metabolic Panel: Recent Labs  Lab 07/10/20 0131 07/10/20 0132 07/10/20 0135 07/10/20 0600 07/10/20 0855 07/11/20 0729  NA 135 132* 135 134*  --  139  K 4.4 4.5 4.5 2.9*  --  2.9*  CL 102 102  --  111  --  106  CO2  --  19*  --  17*  --  21*  GLUCOSE 169* 164*  --  110*  --  137*  BUN 19 15  --  9  --  8  CREATININE 0.40* 0.65  --  0.52  --  0.54  CALCIUM  --  8.7*  --  7.3*  --  8.5*  MG  --   --   --   --  1.8  --    GFR: Estimated Creatinine Clearance: 61 mL/min (by C-G formula based on SCr of 0.54 mg/dL). Liver Function Tests: Recent Labs  Lab 07/10/20 0132  07/10/20 0600 07/11/20 0729  AST 65* 40 47*  ALT 37 26 28  ALKPHOS 62 53 101  BILITOT 2.7* 2.6* 1.9*  PROT 6.5 5.2* 6.5  ALBUMIN 3.5 2.9* 3.1*   No results for input(s): LIPASE, AMYLASE in the last 168 hours. Recent Labs  Lab 07/10/20 0733  AMMONIA 55*   Coagulation Profile: Recent Labs  Lab 07/10/20 0132  INR 1.3*   Cardiac Enzymes: No results for input(s): CKTOTAL, CKMB, CKMBINDEX, TROPONINI in the last 168 hours. BNP (last 3 results) No results for input(s): PROBNP in the last 8760 hours. HbA1C: Recent Labs    07/10/20 0733  HGBA1C 4.6*   CBG: No results for input(s): GLUCAP in the last 168 hours. Lipid Profile: No results for input(s): CHOL, HDL, LDLCALC, TRIG, CHOLHDL, LDLDIRECT in the last 72 hours. Thyroid Function Tests: No results for input(s): TSH, T4TOTAL, FREET4, T3FREE, THYROIDAB in the last 72 hours. Anemia Panel: Recent Labs    07/10/20 0600 07/10/20 0733  VITAMINB12 323  --   FOLATE  --  3.8*   Sepsis Labs: Recent Labs  Lab 07/10/20 0132 07/10/20 0300 07/10/20 0855 07/11/20 0729  PROCALCITON  --   --  2.44 2.20  LATICACIDVEN 4.0* 1.6  --   --     Recent Results (from the past 240 hour(s))  Blood Culture (routine x 2)     Status: None (Preliminary result)   Collection Time: 07/10/20  1:33 AM   Specimen: BLOOD  Result Value Ref Range Status   Specimen Description BLOOD SITE NOT SPECIFIED  Final   Special Requests   Final    BOTTLES DRAWN AEROBIC AND ANAEROBIC Blood Culture adequate volume   Culture   Final    NO GROWTH 1 DAY Performed at [facility], [address] [place], [place] [address]    Report Status PENDING  Incomplete  Resp Panel by RT-PCR (Flu A&B, Covid) Nasopharyngeal Swab     Status: None   Collection Time: 07/10/20  1:47 AM   Specimen: Nasopharyngeal Swab; Nasopharyngeal(NP) swabs in vial transport medium  Result Value Ref Range Status   SARS Coronavirus 2 by RT PCR NEGATIVE NEGATIVE Final    Comment:  (NOTE) SARS-CoV-2 target nucleic acids are NOT DETECTED.  The SARS-CoV-2  RNA is generally detectable in upper respiratory specimens during the acute phase of infection. The lowest concentration of SARS-CoV-2 viral copies this assay can detect is 138 copies/mL. A negative result does not preclude SARS-Cov-2 infection and should not be used as the sole basis for treatment or other patient management decisions. A negative result may occur with  improper specimen collection/handling, submission of specimen other than nasopharyngeal swab, presence of viral mutation(s) within the areas targeted by this assay, and inadequate number of viral copies(<138 copies/mL). A negative result must be combined with clinical observations, patient history, and epidemiological information. The expected result is Negative.  Fact Sheet for Patients:  EntrepreneurPulse.com.au  Fact Sheet for Healthcare Providers:  IncredibleEmployment.be  This test is no t yet approved or cleared by the [place] FDA and  has been authorized for detection and/or diagnosis of SARS-CoV-2 by FDA under an Emergency Use Authorization (EUA). This EUA will remain  in effect (meaning this test can be used) for the duration of the COVID-19 declaration under Section 564(b)(1) of the Act, 21 U.S.C.section 360bbb-3(b)(1), unless the authorization is terminated  or revoked sooner.       Influenza A by PCR NEGATIVE NEGATIVE Final   Influenza B by PCR NEGATIVE NEGATIVE Final    Comment: (NOTE) The Xpert Xpress SARS-CoV-2/FLU/RSV plus assay is intended as an aid in the diagnosis of influenza from Nasopharyngeal swab specimens and should not be used as a sole basis for treatment. Nasal washings and aspirates are unacceptable for Xpert Xpress SARS-CoV-2/FLU/RSV testing.  Fact Sheet for Patients: EntrepreneurPulse.com.au  Fact Sheet for Healthcare  Providers: IncredibleEmployment.be  This test is not yet approved or cleared by the [place] FDA and has been authorized for detection and/or diagnosis of SARS-CoV-2 by FDA under an Emergency Use Authorization (EUA). This EUA will remain in effect (meaning this test can be used) for the duration of the COVID-19 declaration under Section 564(b)(1) of the Act, 21 U.S.C. section 360bbb-3(b)(1), unless the authorization is terminated or revoked.  Performed at [facility], [place] [address], [place], [place] [address]   Respiratory (~20 pathogens) panel by PCR     Status: Abnormal   Collection Time: 07/10/20  1:47 AM   Specimen: Nasopharyngeal Swab; Respiratory  Result Value Ref Range Status   Adenovirus NOT DETECTED NOT DETECTED Final   Coronavirus 229E NOT DETECTED NOT DETECTED Final    Comment: (NOTE) The Coronavirus on the Respiratory Panel, DOES NOT test for the novel  Coronavirus (2019 nCoV)    Coronavirus HKU1 NOT DETECTED NOT DETECTED Final   Coronavirus NL63 DETECTED (A) NOT DETECTED Final   Coronavirus OC43 NOT DETECTED NOT DETECTED Final   Metapneumovirus NOT DETECTED NOT DETECTED Final   Rhinovirus / Enterovirus NOT DETECTED NOT DETECTED Final   Influenza A NOT DETECTED NOT DETECTED Final   Influenza B NOT DETECTED NOT DETECTED Final   Parainfluenza Virus 1 NOT DETECTED NOT DETECTED Final   Parainfluenza Virus 2 NOT DETECTED NOT DETECTED Final   Parainfluenza Virus 3 NOT DETECTED NOT DETECTED Final   Parainfluenza Virus 4 NOT DETECTED NOT DETECTED Final   Respiratory Syncytial Virus NOT DETECTED NOT DETECTED Final   Bordetella pertussis NOT DETECTED NOT DETECTED Final   Bordetella Parapertussis NOT DETECTED NOT DETECTED Final   Chlamydophila pneumoniae NOT DETECTED NOT DETECTED Final   Mycoplasma pneumoniae NOT DETECTED NOT DETECTED Final    Comment: Performed at [facility], [place]. [address] [place], [place] [address]  Urine culture  Status: None   Collection Time: 07/10/20  3:57 AM   Specimen: In/Out Cath Urine  Result Value Ref Range Status   Specimen Description IN/OUT CATH URINE  Final   Special Requests NONE  Final   Culture   Final    NO GROWTH Performed at [facility], [address], [place], [place] [address]    Report Status 07/11/2020 FINAL  Final  Culture, blood (Routine X 2) w Reflex to ID Panel     Status: None (Preliminary result)   Collection Time: 07/10/20  7:33 AM   Specimen: BLOOD LEFT HAND  Result Value Ref Range Status   Specimen Description BLOOD LEFT HAND  Final   Special Requests   Final    BOTTLES DRAWN AEROBIC AND ANAEROBIC Blood Culture results may not be optimal due to an inadequate volume of blood received in culture bottles   Culture   Final    NO GROWTH 1 DAY Performed at [facility], [address] [place], [place] [address]    Report Status PENDING  Incomplete  MRSA PCR Screening     Status: Abnormal   Collection Time: 07/10/20  8:51 AM   Specimen: Nasal Mucosa; Nasopharyngeal  Result Value Ref Range Status   MRSA by PCR POSITIVE (A) NEGATIVE Final    Comment:        The GeneXpert MRSA Assay (FDA approved for NASAL specimens only), is one component of a comprehensive MRSA colonization surveillance program. It is not intended to diagnose MRSA infection nor to guide or monitor treatment for MRSA infections. CRITICAL RESULT CALLED TO, READ BACK BY AND VERIFIED WITH: RN MARION DIXON AT 1135 BY KJ ON 07/10/20 Performed at [facility], [address], [place], [place] [address]      Radiology Studies: CT HEAD WO CONTRAST  Result Date: 07/10/2020 CLINICAL DATA:  Altered mental status EXAM: CT HEAD WITHOUT CONTRAST TECHNIQUE: Contiguous axial images were obtained from the base of the skull through the vertex without intravenous contrast. COMPARISON:  None. FINDINGS: Brain: Normal anatomic configuration. Parenchymal volume loss is commensurate with the  patient's age. No abnormal intra or extra-axial mass lesion or fluid collection. No abnormal mass effect or midline shift. No evidence of acute intracranial hemorrhage or infarct. Ventricular size is normal. Cerebellum unremarkable. Vascular: No asymmetric hyperdense vasculature at the skull base. Skull: Intact Sinuses/Orbits: Paranasal sinuses are clear. Orbits are unremarkable. Other: Mastoid air cells and middle ear cavities are clear. IMPRESSION: No acute intracranial hemorrhage or infarct. Electronically Signed   By: Helyn Numbers MD   On: 07/10/2020 05:23   CT CHEST ABDOMEN PELVIS W CONTRAST  Result Date: 07/10/2020 CLINICAL DATA:  Abdominal pain, fever EXAM: CT CHEST, ABDOMEN, AND PELVIS WITH CONTRAST TECHNIQUE: Multidetector CT imaging of the chest, abdomen and pelvis was performed following the standard protocol during bolus administration of intravenous contrast. CONTRAST:  OMNIPAQUE IOHEXOL 300 MG/ML  SOLN COMPARISON:  None. FINDINGS: CT CHEST FINDINGS Cardiovascular: No significant coronary artery calcification. Global cardiac size within normal limits. No pericardial effusion. The central pulmonary arteries are of normal caliber. The thoracic aorta is unremarkable. Mediastinum/Nodes: 2.0 cm indeterminate nodule noted within the inferior left thyroid lobe. No pathologic thoracic adenopathy. The esophagus is unremarkable. 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 Lungs/Pleura: There is bibasilar consolidation involving the dependent lower lobes as well as scattered multifocal pulmonary infiltrates within the lingula, posterior segment of the right upper lobe, and aerated portion of the lower lobes bilaterally.  Differential considerations are led by aspiration and atypical infection. No pneumothorax or pleural effusion. No central obstructing lesion. Musculoskeletal: No acute bone abnormality within the thorax. No lytic or blastic bone lesion. Cervical fusion hardware is partially  visualized. CT ABDOMEN PELVIS FINDINGS Hepatobiliary: The liver contour is nodular and there is hypertrophy of the left hepatic lobe in keeping with cirrhosis. Heterogeneous enhancement of the hepatic parenchyma may relate to underlying parenchymal fibrosis or regenerative nodularity. No discrete focal enhancing intrahepatic mass is identified. Tiny cyst noted within segment 8. Trace perihepatic and pericholecystic ascites. Gallbladder otherwise unremarkable. No intra or extrahepatic biliary ductal dilation. Pancreas: Unremarkable Spleen: Mild splenomegaly is present with the spleen measuring 14.2 cm in greatest dimension. No intrasplenic lesion. The splenic vein is patent. Adrenals/Urinary Tract: Adrenal glands are unremarkable. Kidneys are normal, without renal calculi, focal lesion, or hydronephrosis. Bladder is unremarkable. Stomach/Bowel: The stomach, small bowel, and large bowel are unremarkable. The appendix is not clearly identified and may be absent. No free intraperitoneal gas. Vascular/Lymphatic: Mild aortoiliac atherosclerotic calcification. No aortic aneurysm. No pathologic adenopathy within the abdomen and pelvis. Reproductive: Tubal ligation clips are seen bilaterally. The pelvic organs are otherwise unremarkable. Other: Tiny fat containing right inguinal hernia. Rectum unremarkable. Musculoskeletal: L4-5 posterior lumbar fusion and decompression has been performed. No acute bone abnormality. No lytic or blastic bone lesion. IMPRESSION: Bibasilar pulmonary consolidation multifocal pulmonary infiltrates most in keeping with aspiration or atypical infection in the acute setting. Cirrhosis with evidence of portal venous hypertension with mild splenomegaly, gastroesophageal varices, and trace ascites. No enhancing intrahepatic mass identified. 2 cm left thyroid nodule. Recommend thyroid US (ref: J Am Coll Radiol. 2015 Feb;12(2): 143-50). Aortic Atherosclerosis (ICD10-I70.0). Electronically Signed   By:  Fidela Salisbury MD   On: 07/10/2020 23:43   DG Chest Port 1 View  Result Date: 07/10/2020 CLINICAL DATA:  Sepsis EXAM: PORTABLE CHEST 1 VIEW COMPARISON:  06/29/2013 FINDINGS: Ground-glass opacity and patchy consolidations within the bilateral mid to lower lungs consistent with pneumonia. No pleural effusion. Normal heart size. No pneumothorax IMPRESSION: Ground-glass opacity and patchy consolidations within the bilateral mid to lower lungs consistent with pneumonia. Electronically Signed   By: Donavan Foil M.D.   On: 07/10/2020 01:20   US Abdomen Limited RUQ (LIVER/GB)  Result Date: 07/10/2020 CLINICAL DATA:  65 year old female with hepatitis C. EXAM: ULTRASOUND ABDOMEN LIMITED RIGHT UPPER QUADRANT COMPARISON:  None. FINDINGS: Gallbladder: Dependent sludge. Gallbladder wall thickness remains normal. No shadowing stones. No pericholecystic fluid. No sonographic Murphy sign elicited. Common bile duct: Diameter: 2 mm, normal Liver: Highly nodular liver contour (image 15). Coarse echotexture. Background echogenicity within normal limits. Small round hyperechoic lesion in the right hepatic lobe measuring 7 mm on image 34. No other discrete liver lesion. Portal vein is patent on color Doppler imaging with normal direction of blood flow towards the liver. Other: Negative visible right kidney.  No free fluid. IMPRESSION: 1. Nodular, cirrhotic liver. Solitary 7 mm echogenic focus in the right hepatic lobe most suggestive of small benign hemangioma. Recommend follow-up outpatient Liver MRI (without and with contrast) to confirm. 2. Gallbladder sludge. Electronically Signed   By: Genevie Ann M.D.   On: 07/10/2020 07:06    Scheduled Meds: . Chlorhexidine Gluconate Cloth  6 each Topical Q0600  . feeding supplement  237 mL Oral BID BM  . lactulose  20 g Oral BID  . losartan  50 mg Oral Daily  . methylPREDNISolone (SOLU-MEDROL) injection  40 mg Intravenous Once  . metoprolol tartrate  5 mg Intravenous Once  .  morphine  30 mg Oral Q12H  . mupirocin ointment  1 application Nasal BID  . [START ON 07/12/2020] pneumococcal 23 valent vaccine  0.5 mL Intramuscular Tomorrow-1000  . potassium chloride  40 mEq Oral Q4H  . pregabalin  150 mg Oral BID   Continuous Infusions: . azithromycin 500 mg (07/11/20 0839)  . cefTRIAXone (ROCEPHIN)  IV 2 g (07/10/20 1648)  . magnesium sulfate bolus IVPB    . magnesium sulfate bolus IVPB       LOS: 1 day   Time spent: 45 minutes. More than 50% of the time was spent in counseling/coordination of care  Lorella Nimrod, MD [facility]  If 7PM-7AM, please contact night-coverage [email or web address]  07/11/2020, 10:17 AM   This record has been created using Systems analyst. Errors have been sought and corrected,but may not always be located. Such creation errors do not reflect on the standard of care.

## 2020-07-11 NOTE — Consult Note (Signed)
Consultation  Referring Provider: [facility]/ Nelson Chimes Primary Care Physician:  Jackie Plum, MD Primary Gastroenterologist:  none.  Reason for Consultation: New diagnosis of cirrhosis  HPI: Anita Price is a 65 y.o. female, who was admitted yesterday through the emergency room with altered mental status.  Family reported that she had had a gastroenteritis a couple of weeks ago and since that time had not been feeling well in general and has had decrease in appetite and intermittent confusion. Patient has history of degenerative joint disease, prolonged QT, chronic back pain, hypertension, and history of hepatitis C.  She says she was told she had hepatitis C many years ago, had never been treated. She had not been seen by a physician in some years.  On admit found to be tachycardic with temp of 100.9, and hypoxic with PO2 of 58, lactate of 4. CT of the chest abdomen pelvis showed a multifocal bilateral pneumonia, and a nodular liver consistent with cirrhosis, no mass, mild splenomegaly, evidence of portal hypertension and gastroesophageal varices as well as trace ascites.  She has been started on vancomycin and IV Flagyl. Today's labs show WBC 9.7/hemoglobin 15/hematocrit 43/platelets 85 Pro time 15.7/INR 1.3, venous ammonia 55 Potassium 2.9/albumin 3.1T bili 1.9/AST 47/ALT 28. Hep C RNA qualitative is pending.  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  She used to drink wine on a regular basis but not in the past 20 years.  She has a very remote history of IV drug use in her early 55s. She had never been told of any history of abnormal liver tests or cirrhosis previously. Patient says she has been the primary caregiver for her 2 grandsons aged 71 and 64 who live with her.  She is feeling a bit better today but mentation still slow     Past Medical History:  Diagnosis Date  . DJD (degenerative joint disease)   .  Essential hypertension 07/10/2020  . Hepatitis C 07/10/2020  . Pain management   . Tobacco use 07/10/2020    Past Surgical History:  Procedure Laterality Date  . BACK SURGERY    . btl    . HERNIA REPAIR    . WRIST SURGERY      Prior to Admission medications   Medication Sig Start Date End Date Taking? Authorizing Provider  acetaminophen (TYLENOL) 500 MG tablet Take 500 mg by mouth every 8 (eight) hours as needed for moderate pain.   Yes [provider]  albuterol (VENTOLIN HFA) 108 (90 Base) MCG/ACT inhaler Inhale into the lungs every 6 (six) hours as needed for wheezing or shortness of breath.   Yes [provider]  morphine (MS CONTIN) 30 MG 12 hr tablet Take 30 mg by mouth every 8 (eight) hours. 02/09/20  Yes [provider]  pregabalin (LYRICA) 300 MG capsule Take 300 mg by mouth in the morning and at bedtime. 01/03/20  Yes [provider]    Current Facility-Administered Medications  Medication Dose Route Frequency Provider Last Rate Last Admin  . albuterol (PROVENTIL) (2.5 MG/3ML) 0.083% nebulizer solution 2.5 mg  2.5 mg Nebulization Q2H PRN Bobette Mo, MD      . albuterol (PROVENTIL) (2.5 MG/3ML) 0.083% nebulizer solution 2.5 mg  2.5 mg Nebulization Q4H PRN Bobette Mo, MD      . azithromycin [facility]) 500 mg in sodium chloride 0.9 % 250 mL IVPB  500 mg Intravenous Daily Bobette Mo,  MD 250 mL/hr at 07/11/20 0839 500 mg at 07/11/20 0839  . cefTRIAXone (ROCEPHIN) 2 g in sodium chloride 0.9 % 100 mL IVPB  2 g Intravenous Q24H Arnetha Courser, MD 200 mL/hr at 07/10/20 1648 2 g at 07/10/20 1648  . Chlorhexidine Gluconate Cloth 2 % PADS 6 each  6 each Topical Q0600 Arnetha Courser, MD   6 each at 07/11/20 0656  . feeding supplement (ENSURE ENLIVE / ENSURE PLUS) liquid 237 mL  237 mL Oral BID BM Arnetha Courser, MD   237 mL at 07/11/20 1113  . lactulose (CHRONULAC) 10 GM/15ML solution 10 g  10 g Oral BID Arnetha Courser, MD      .  losartan (COZAAR) tablet 50 mg  50 mg Oral Daily Arnetha Courser, MD   50 mg at 07/11/20 0836  . methylPREDNISolone sodium succinate (SOLU-MEDROL) 40 mg/mL injection 40 mg  40 mg Intravenous Once Bobette Mo, MD      . metoprolol tartrate (LOPRESSOR) injection 5 mg  5 mg Intravenous Once Bobette Mo, MD      . morphine (MS CONTIN) 12 hr tablet 30 mg  30 mg Oral Mackie Pai, MD   30 mg at 07/11/20 1225  . mupirocin ointment (BACTROBAN) 2 % 1 application  1 application Nasal BID Arnetha Courser, MD   1 application at 07/11/20 0839  . [START ON 07/12/2020] pneumococcal 23 valent vaccine (PNEUMOVAX-23) injection 0.5 mL  0.5 mL Intramuscular Tomorrow-1000 Amin, Tilman Neat, MD      . potassium chloride SA (KLOR-CON) CR tablet 40 mEq  40 mEq Oral Q4H Arnetha Courser, MD   40 mEq at 07/11/20 1113  . pregabalin (LYRICA) capsule 150 mg  150 mg Oral BID Arnetha Courser, MD   150 mg at 07/11/20 1113    Allergies as of 07/10/2020 - Review Complete 07/10/2020  Allergen Reaction Noted  . Effersyllium [psyllium]  04/04/2011  . Neurontin [gabapentin]  04/04/2011  . Tramadol  04/04/2011    Family History  Problem Relation Age of Onset  . Scoliosis Mother     Social History   Socioeconomic History  . Marital status: Single    Spouse name: Not on file  . Number of children: Not on file  . Years of education: Not on file  . Highest education level: Not on file  Occupational History  . Not on file  Tobacco Use  . Smoking status: Current Every Day Smoker    Packs/day: 0.50  . Smokeless tobacco: Never Used  Vaping Use  . Vaping Use: Never used  Substance and Sexual Activity  . Alcohol use: No  . Drug use: Never  . Sexual activity: Not on file  Other Topics Concern  . Not on file  Social History Narrative  . Not on file   Social Determinants of Health   Financial Resource Strain: Not on file  Food Insecurity: Not on file  Transportation Needs: Not on file  Physical Activity: Not  on file  Stress: Not on file  Social Connections: Not on file  Intimate Partner Violence: Not on file    Review of Systems: Pertinent positive and negative review of systems were noted in the above HPI section.  All other review of systems was otherwise negative.  Physical Exam: Vital signs in last 24 hours: Temp:  [98.4 F (36.9 C)-99.8 F (37.7 C)] 99.4 F (37.4 C) (04/12 1148) Pulse Rate:  [85-100] 94 (04/12 1148) Resp:  [20-30] 22 (04/12 1148) BP: (147-182)/(75-87) 147/75 (04/12  1148) SpO2:  [90 %-95 %] 95 % (04/12 1148) Last BM Date: 07/11/20 General:   Alert,  Well-developed, thin older white female, pleasant and cooperative in NAD Head:  Normocephalic and atraumatic. Eyes:  Sclera clear, no icterus.   Conjunctiva pink. Ears:  Normal auditory acuity. Nose:  No deformity, discharge,  or lesions. Mouth:  No deformity or lesions.   Neck:  Supple; no masses or thyromegaly. Lungs:     Scattered rhonchi on the right.  Heart:  Regular rate and rhythm; no murmurs, clicks, rubs,  or gallops. Abdomen:  Soft,nontender, BS active,nonpalp mass or hsm.   Rectal:  Deferred  Msk:  Symmetrical without gross deformities. . Pulses:  Normal pulses noted. Extremities:  Without clubbing or edema. Neurologic:  Alert and  oriented x4;  grossly normal neurologically.,  Mentation a little slow no asterixis Skin:  Intact without significant lesions or rashes.. Psych:  Alert and cooperative. Normal mood and affect.  Intake/Output from previous day: 04/11 0701 - 04/12 0700 In: 160 [P.O.:160] Out: -  Intake/Output this shift: No intake/output data recorded.  Lab Results: Recent Labs    07/10/20 0132 07/10/20 0135 07/10/20 0733 07/11/20 0729  WBC 10.3  --  7.4 9.7  HGB 14.7 15.3* 13.1 15.1*  HCT 42.7 45.0 37.9 43.1  PLT 73*  --  61* 85*   BMET Recent Labs    07/10/20 0132 07/10/20 0135 07/10/20 0600 07/11/20 0729  NA 132* 135 134* 139  K 4.5 4.5 2.9* 2.9*  CL 102  --  111 106   CO2 19*  --  17* 21*  GLUCOSE 164*  --  110* 137*  BUN 15  --  9 8  CREATININE 0.65  --  0.52 0.54  CALCIUM 8.7*  --  7.3* 8.5*   LFT Recent Labs    07/11/20 0729  PROT 6.5  ALBUMIN 3.1*  AST 47*  ALT 28  ALKPHOS 101  BILITOT 1.9*   PT/INR Recent Labs    07/10/20 0132 07/11/20 1125  LABPROT 15.7* 15.7*  INR 1.3* 1.3*      IMPRESSION:  #54 65 year old white female with sepsis secondary to a multifocal bilateral pneumonia #2 metabolic encephalopathy-unclear at present whether she may have a component of hepatic encephalopathy  #3 new diagnosis of Cirrhosis made by imaging, and labs. It is very likely secondary to hepatitis C.  She had been told many years ago that she had hepatitis C and had never been treated. She has thrombocytopenia, splenomegaly, and mild coagulopathy as well as varices noted on CT M ELD=8  #4 chronic back pain-had been on chronic narcotics  PLAN: Await hepatitis C qualitative which is pending, add quant Check AFP Okay to continue lactulose Patient will need eventual EGD for variceal screening/surveillance.  This can be done as an outpatient after her pneumonia has cleared  GI will plan to see her in follow-up as an outpatient in 4 to 5 weeks. If hep C is active she can be referred for treatment.  We will also check hepatitis B serologies should immunization for hep B if not immune  We will arrange for outpatient office appointment at the [facility]  [facility] [place] office   Jolly Carlini [facility]  07/11/2020, 3:07 PM

## 2020-07-12 ENCOUNTER — Inpatient Hospital Stay (HOSPITAL_COMMUNITY): Payer: Medicare Other

## 2020-07-12 DIAGNOSIS — E43 Unspecified severe protein-calorie malnutrition: Secondary | ICD-10-CM | POA: Insufficient documentation

## 2020-07-12 LAB — CBC WITH DIFFERENTIAL/PLATELET
Abs Immature Granulocytes: 0.1 10*3/uL — ABNORMAL HIGH (ref 0.00–0.07)
Basophils Absolute: 0 10*3/uL (ref 0.0–0.1)
Basophils Relative: 0 %
Eosinophils Absolute: 0.1 10*3/uL (ref 0.0–0.5)
Eosinophils Relative: 1 %
HCT: 37.7 % (ref 36.0–46.0)
Hemoglobin: 13.1 g/dL (ref 12.0–15.0)
Immature Granulocytes: 1 %
Lymphocytes Relative: 13 %
Lymphs Abs: 1.1 10*3/uL (ref 0.7–4.0)
MCH: 34.4 pg — ABNORMAL HIGH (ref 26.0–34.0)
MCHC: 34.7 g/dL (ref 30.0–36.0)
MCV: 99 fL (ref 80.0–100.0)
Monocytes Absolute: 0.9 10*3/uL (ref 0.1–1.0)
Monocytes Relative: 10 %
Neutro Abs: 6.6 10*3/uL (ref 1.7–7.7)
Neutrophils Relative %: 75 %
Platelets: 84 10*3/uL — ABNORMAL LOW (ref 150–400)
RBC: 3.81 MIL/uL — ABNORMAL LOW (ref 3.87–5.11)
RDW: 13.1 % (ref 11.5–15.5)
WBC: 8.8 10*3/uL (ref 4.0–10.5)
nRBC: 0 % (ref 0.0–0.2)

## 2020-07-12 LAB — BASIC METABOLIC PANEL
Anion gap: 5 (ref 5–15)
BUN: 12 mg/dL (ref 8–23)
CO2: 25 mmol/L (ref 22–32)
Calcium: 8.2 mg/dL — ABNORMAL LOW (ref 8.9–10.3)
Chloride: 106 mmol/L (ref 98–111)
Creatinine, Ser: 0.62 mg/dL (ref 0.44–1.00)
GFR, Estimated: >60 mL/min (ref >60)
Glucose, Bld: 94 mg/dL (ref 70–99)
Potassium: 3.9 mmol/L (ref 3.5–5.1)
Sodium: 136 mmol/L (ref 135–145)

## 2020-07-12 LAB — HEPATITIS C VRS RNA DETECT BY PCR-QUAL: Hepatitis C Vrs RNA by PCR-Qual: POSITIVE — AB

## 2020-07-12 LAB — HEPATITIS C GENOTYPE: HCV Genotype: 3

## 2020-07-12 LAB — HEPATITIS B SURFACE ANTIBODY, QUANTITATIVE: Hep B S AB Quant (Post): 288.6 m[IU]/mL (ref >9.9)

## 2020-07-12 LAB — MAGNESIUM: Magnesium: 2.3 mg/dL (ref 1.7–2.4)

## 2020-07-12 LAB — PROCALCITONIN: Procalcitonin: 1.9 ng/mL

## 2020-07-12 MED ORDER — VITAMIN B-12 1000 MCG PO TABS
1000.0000 ug | ORAL_TABLET | Freq: Every day | ORAL | Status: DC
  Administered 2020-07-12 – 2020-07-13 (×2): 1000 ug via ORAL
  Filled 2020-07-12 (×2): qty 1

## 2020-07-12 MED ORDER — FOLIC ACID 1 MG PO TABS
1.0000 mg | ORAL_TABLET | Freq: Every day | ORAL | Status: DC
  Administered 2020-07-12 – 2020-07-13 (×2): 1 mg via ORAL
  Filled 2020-07-12 (×2): qty 1

## 2020-07-12 MED ORDER — AZITHROMYCIN 500 MG PO TABS
500.0000 mg | ORAL_TABLET | Freq: Every day | ORAL | Status: DC
  Administered 2020-07-13: 500 mg via ORAL
  Filled 2020-07-12: qty 1

## 2020-07-12 MED ORDER — ADULT MULTIVITAMIN W/MINERALS CH
1.0000 | ORAL_TABLET | Freq: Every day | ORAL | Status: DC
  Administered 2020-07-12 – 2020-07-13 (×2): 1 via ORAL
  Filled 2020-07-12 (×2): qty 1

## 2020-07-12 NOTE — Progress Notes (Signed)
Pt ambulated 470" with front wheel walker and standby assist.  Tolerated very well, back to recliner in room.  Call bell within reach.  No complaints or needs at this time.

## 2020-07-12 NOTE — Progress Notes (Signed)
This RN was in patient's room was patient was in bathroom using the bathroom. Per patient, " she turned around too quick". This RN her a thud and found patient lying on the floor with her head on the shower floor. Patient states that, " her head is throbbing and about 6 (0-10)". Notified on call Dr. Rachael Darby, stat CT ordered. Post fall flow worksheet completed. Will continue to monitor

## 2020-07-12 NOTE — Progress Notes (Signed)
   07/12/20 2248  What Happened  Was fall witnessed? No (nurse in room)  Was patient injured? Yes  Patient found in bathroom  Found by Staff-comment  Stated prior activity bathroom-unassisted  Follow Up  MD notified [place]  Time MD notified [id number]  Family notified No - patient refusal  Time family notified  (n/a)  Vitals  Temp 98.9 F (37.2 C)  Temp Source Oral  BP (!) 149/84  MAP (mmHg) 103  BP Location Left Arm  BP Method Automatic  Patient Position (if appropriate) Sitting  Pulse Rate (!) 101  Pulse Rate Source Monitor  ECG Heart Rate (!) 101  Resp 20  Oxygen Therapy  SpO2 92 %  O2 Device Room Air  Pain Assessment  Pain Scale 0-10  Pain Score 6  Pain Type Acute pain  Pain Location Head  Pain Orientation Posterior  Neurological  Level of Consciousness Alert

## 2020-07-12 NOTE — Progress Notes (Signed)
Progress Note    Anita Price   TKP:[id number]  DOB: June 23, 1955  DOA: 07/10/2020     2  PCP: Anita Mccreedy, MD  CC: AMS  Hospital Course: Anita Pata Jacksonis a 65 y.o.femalewith medical history significant ofDJD of multiple sites, chronic back pain, history of back surgery, chronic hepatitis C, tobacco abuse, essential hypertension who was brought to the emergency department due toaltered mental status. The patient's daughter stated that she has had gastroenteritis about 2 weeks ago and has not been the same since then. Her appetite has been decreased. She has been confused and at times her speech has been slurred.  Patient was febrile on arrival at 100.9, tachycardic and tachypneic, chest x-ray concerning for multifocal pneumonia although no respiratory symptoms, elevated lactic acid at 4>1.8, t bili 2.7,INR 1.3, ammonia levels of 55, RUQ [place] with liver cirrhosis.  Calcitonin at 2.44 Initially met sepsis criteria most likely secondary to pneumonia.  Cultures pending. CT chest and abdomen concerning for multifocal bilateral pneumonia, respiratory viral panel positive for coronavirus NL63, liver cirrhosis with signs of portal hypertension and esophageal varices, incidental finding of 2 cm of thyroid nodule-thyroid ultrasound ordered.  Daughter at bedside who also stated that patient is still not at her baseline, progressive worsening in her mental status for the past few weeks, progressive decline over the past couple of years with some unintentional weight loss.  Patient was never a heavy smoker but has not been drinking recently, occasionally used to take some alcohol.  Current smoker, has not seen physicians in years except pain management clinic due to financial reasons.  Her son passed away and she is taking care of her grand sons age 45 and 45. Daughter seems supportive but was very overwhelmed with all those findings as she was not aware of any liver cirrhosis.  Patient has an  history of chronic hep C which was never treated.  No Covid vaccine or other preventive healthcare.  No EGD or colonoscopy, no mammography.  Interval History:  No events overnight.  Resting in bed in no distress.  Hopeful for going home possibly tomorrow as we talked about.  Understands tentative plan is for outpatient work-up with GI for cirrhosis and hep C.  ROS: Constitutional: negative for chills and fevers, Respiratory: negative for cough, Cardiovascular: negative for chest pain and Gastrointestinal: negative for abdominal pain  Assessment & Plan: Acute metabolic encephalopathy/hepatic encephalopathy/liver cirrhosis/questionable multifocal pneumonia. Altered mental status most likely secondary to hepatic encephalopathy as ammonia levels were elevated with diagnosis of liver cirrhosis.  Elevated T bili and INR.  Child pugh score of class B and 8 TSH and A1c within normal limit. Although chest x-ray with concern of multifocal pneumonia denies any upper respiratory symptoms, she was febrile and elevated procalcitonin but no leukocytosis. She received cefepime, vancomycin, metronidazole in ED and continued with cefepime, vancomycin and Zithromax. -Discontinued cefepime, vancomycin. -Continue ceftriaxone and Zithromax -Follow-up blood and urine cultures- remain negative. -Continue lactulose (dose adjusted) - appreciate GI assistance; outpatient follow up for further cirrhosis/HCV workup   Thrombocytopenia.  Most likely secondary to liver disease.  No sign of active bleeding.  Platelets stable. No baseline as it was within normal limit 9 years ago, no blood work-up since then. -Continue to monitor  Hypertension.  Hypertension is listed in her chart with patient was not taking any medicine for a long time.  Blood pressure elevated. -Continue losartan  Chronic back pain.  Apparently patient was on high-dose of OxyContin 30 mg every  8 hourly along with Lyrica 300 mg twice daily which was  being given to her at the pain clinic for her chronic back pain. -Restart home OxyContin at twice daily dose-will need tapering of pain meds for concern of opioid withdrawal as she is taking them for a long time. -Decrease the dose of Lyrica to 150 mg twice daily  Protein caloric malnutrition.  Albumin at 2.9 and patient appears malnourished with muscle wasting. Body mass index is 19.97 kg/m. -Dietitian consult  Incidental finding of thyroid nodule.   Seen on CT.  Thyroid ultrasound shows benign subcentimeter cystic nodules  Old records reviewed in assessment of this patient  Antimicrobials: Cefepime 4/11 x 2 Vanc 4/11 x 2 Azithro 4/11 >> current Rocephin 4/11 >> current   DVT prophylaxis: SCD   Code Status:   Code Status: Full Code Family Communication:   Disposition Plan: Status is: Inpatient  Remains inpatient appropriate because:Inpatient level of care appropriate due to severity of illness   Dispo: The patient is from: Home              Anticipated d/c is to: Home              Patient currently is not medically stable to d/c.   Difficult to place patient No  Risk of unplanned readmission score: Unplanned Admission- Pilot do not use: 13.13   Objective: Blood pressure 138/70, pulse 96, temperature 98.6 F (37 C), temperature source Oral, resp. rate 20, height $RemoveBe'5\' 5"'yKCGQbWZa$  (1.651 m), weight 54.4 kg, SpO2 98 %.  Examination: General appearance: alert, cooperative and no distress Head: Normocephalic, without obvious abnormality, atraumatic Eyes: EOMI Lungs: clear to auscultation bilaterally Heart: regular rate and rhythm and S1, S2 normal Abdomen: normal findings: bowel sounds normal and soft, non-tender Extremities: no edema Skin: mobility and turgor normal Neurologic: Grossly normal  Consultants:   GI  Procedures:     Data Reviewed: I have personally reviewed following labs and imaging studies Results for orders placed or performed during the hospital  encounter of 07/10/20 (from the past 24 hour(s))  Hepatitis B surface antibody     Status: None   Collection Time: 07/11/20  3:05 PM  Result Value Ref Range   Hepatitis B-Post 288.6 Immunity>9.9 mIU/mL  Hepatitis B surface antigen     Status: None   Collection Time: 07/11/20  3:07 PM  Result Value Ref Range   Hepatitis B Surface Ag NON REACTIVE NON REACTIVE  CBC with Differential     Status: Abnormal   Collection Time: 07/12/20  3:11 AM  Result Value Ref Range   WBC 8.8 4.0 - 10.5 K/uL   RBC 3.81 (L) 3.87 - 5.11 MIL/uL   Hemoglobin 13.1 12.0 - 15.0 g/dL   HCT 37.7 36.0 - 46.0 %   MCV 99.0 80.0 - 100.0 fL   MCH 34.4 (H) 26.0 - 34.0 pg   MCHC 34.7 30.0 - 36.0 g/dL   RDW 13.1 11.5 - 15.5 %   Platelets 84 (L) 150 - 400 K/uL   nRBC 0.0 0.0 - 0.2 %   Neutrophils Relative % 75 %   Neutro Abs 6.6 1.7 - 7.7 K/uL   Lymphocytes Relative 13 %   Lymphs Abs 1.1 0.7 - 4.0 K/uL   Monocytes Relative 10 %   Monocytes Absolute 0.9 0.1 - 1.0 K/uL   Eosinophils Relative 1 %   Eosinophils Absolute 0.1 0.0 - 0.5 K/uL   Basophils Relative 0 %   Basophils Absolute 0.0 0.0 -  0.1 K/uL   Immature Granulocytes 1 %   Abs Immature Granulocytes 0.10 (H) 0.00 - 0.07 K/uL  Procalcitonin     Status: None   Collection Time: 07/12/20  3:11 AM  Result Value Ref Range   Procalcitonin 1.90 ng/mL  Basic metabolic panel     Status: Abnormal   Collection Time: 07/12/20  3:11 AM  Result Value Ref Range   Sodium 136 135 - 145 mmol/L   Potassium 3.9 3.5 - 5.1 mmol/L   Chloride 106 98 - 111 mmol/L   CO2 25 22 - 32 mmol/L   Glucose, Bld 94 70 - 99 mg/dL   BUN 12 8 - 23 mg/dL   Creatinine, Ser 0.62 0.44 - 1.00 mg/dL   Calcium 8.2 (L) 8.9 - 10.3 mg/dL   GFR, Estimated >60 >60 mL/min   Anion gap 5 5 - 15  Magnesium     Status: None   Collection Time: 07/12/20  3:11 AM  Result Value Ref Range   Magnesium 2.3 1.7 - 2.4 mg/dL    Recent Results (from the past 240 hour(s))  Blood Culture (routine x 2)     Status:  None (Preliminary result)   Collection Time: 07/10/20  1:33 AM   Specimen: BLOOD  Result Value Ref Range Status   Specimen Description BLOOD SITE NOT SPECIFIED  Final   Special Requests   Final    BOTTLES DRAWN AEROBIC AND ANAEROBIC Blood Culture adequate volume   Culture   Final    NO GROWTH 2 DAYS Performed at [facility], [address], [place], [place] [address]    Report Status PENDING  Incomplete  Resp Panel by RT-PCR (Flu A&B, Covid) Nasopharyngeal Swab     Status: None   Collection Time: 07/10/20  1:47 AM   Specimen: Nasopharyngeal Swab; Nasopharyngeal(NP) swabs in vial transport medium  Result Value Ref Range Status   SARS Coronavirus 2 by RT PCR NEGATIVE NEGATIVE Final    Comment: (NOTE) SARS-CoV-2 target nucleic acids are NOT DETECTED.  The SARS-CoV-2 RNA is generally detectable in upper respiratory specimens during the acute phase of infection. The lowest concentration of SARS-CoV-2 viral copies this assay can detect is 138 copies/mL. A negative result does not preclude SARS-Cov-2 infection and should not be used as the sole basis for treatment or other patient management decisions. A negative result may occur with  improper specimen collection/handling, submission of specimen other than nasopharyngeal swab, presence of viral mutation(s) within the areas targeted by this assay, and inadequate number of viral copies(<138 copies/mL). A negative result must be combined with clinical observations, patient history, and epidemiological information. The expected result is Negative.  Fact Sheet for Patients:  EntrepreneurPulse.com.au  Fact Sheet for Healthcare Providers:  IncredibleEmployment.be  This test is no t yet approved or cleared by the [place] FDA and  has been authorized for detection and/or diagnosis of SARS-CoV-2 by FDA under an Emergency Use Authorization (EUA). This EUA will remain  in effect (meaning this  test can be used) for the duration of the COVID-19 declaration under Section 564(b)(1) of the Act, 21 U.S.C.section 360bbb-3(b)(1), unless the authorization is terminated  or revoked sooner.       Influenza A by PCR NEGATIVE NEGATIVE Final   Influenza B by PCR NEGATIVE NEGATIVE Final    Comment: (NOTE) The Xpert Xpress SARS-CoV-2/FLU/RSV plus assay is intended as an aid in the diagnosis of influenza from Nasopharyngeal swab specimens and should not be used as a  sole basis for treatment. Nasal washings and aspirates are unacceptable for Xpert Xpress SARS-CoV-2/FLU/RSV testing.  Fact Sheet for Patients: EntrepreneurPulse.com.au  Fact Sheet for Healthcare Providers: IncredibleEmployment.be  This test is not yet approved or cleared by the [place] FDA and has been authorized for detection and/or diagnosis of SARS-CoV-2 by FDA under an Emergency Use Authorization (EUA). This EUA will remain in effect (meaning this test can be used) for the duration of the COVID-19 declaration under Section 564(b)(1) of the Act, 21 U.S.C. section 360bbb-3(b)(1), unless the authorization is terminated or revoked.  Performed at [facility], [place] [address], [place], [place] [address]   Respiratory (~20 pathogens) panel by PCR     Status: Abnormal   Collection Time: 07/10/20  1:47 AM   Specimen: Nasopharyngeal Swab; Respiratory  Result Value Ref Range Status   Adenovirus NOT DETECTED NOT DETECTED Final   Coronavirus 229E NOT DETECTED NOT DETECTED Final    Comment: (NOTE) The Coronavirus on the Respiratory Panel, DOES NOT test for the novel  Coronavirus (2019 nCoV)    Coronavirus HKU1 NOT DETECTED NOT DETECTED Final   Coronavirus NL63 DETECTED (A) NOT DETECTED Final   Coronavirus OC43 NOT DETECTED NOT DETECTED Final   Metapneumovirus NOT DETECTED NOT DETECTED Final   Rhinovirus / Enterovirus NOT DETECTED NOT DETECTED Final   Influenza A NOT DETECTED  NOT DETECTED Final   Influenza B NOT DETECTED NOT DETECTED Final   Parainfluenza Virus 1 NOT DETECTED NOT DETECTED Final   Parainfluenza Virus 2 NOT DETECTED NOT DETECTED Final   Parainfluenza Virus 3 NOT DETECTED NOT DETECTED Final   Parainfluenza Virus 4 NOT DETECTED NOT DETECTED Final   Respiratory Syncytial Virus NOT DETECTED NOT DETECTED Final   Bordetella pertussis NOT DETECTED NOT DETECTED Final   Bordetella Parapertussis NOT DETECTED NOT DETECTED Final   Chlamydophila pneumoniae NOT DETECTED NOT DETECTED Final   Mycoplasma pneumoniae NOT DETECTED NOT DETECTED Final    Comment: Performed at [facility], [place]. [address], [place], [place] [address]  Urine culture     Status: None   Collection Time: 07/10/20  3:57 AM   Specimen: In/Out Cath Urine  Result Value Ref Range Status   Specimen Description IN/OUT CATH URINE  Final   Special Requests NONE  Final   Culture   Final    NO GROWTH Performed at [facility], [place] [address] [place], [place] [address]    Report Status 07/11/2020 FINAL  Final  Culture, blood (Routine X 2) w Reflex to ID Panel     Status: None (Preliminary result)   Collection Time: 07/10/20  7:33 AM   Specimen: BLOOD LEFT HAND  Result Value Ref Range Status   Specimen Description BLOOD LEFT HAND  Final   Special Requests   Final    BOTTLES DRAWN AEROBIC AND ANAEROBIC Blood Culture results may not be optimal due to an inadequate volume of blood received in culture bottles   Culture   Final    NO GROWTH 2 DAYS Performed at [facility], [place] [address], [place], [place] [address]    Report Status PENDING  Incomplete  MRSA PCR Screening     Status: Abnormal   Collection Time: 07/10/20  8:51 AM   Specimen: Nasal Mucosa; Nasopharyngeal  Result Value Ref Range Status   MRSA by PCR POSITIVE (A) NEGATIVE Final    Comment:        The GeneXpert MRSA Assay (FDA approved for  NASAL specimens only), is one component of a comprehensive MRSA  colonization surveillance program. It is not intended to diagnose MRSA infection nor to guide or monitor treatment for MRSA infections. CRITICAL RESULT CALLED TO, READ BACK BY AND VERIFIED WITH: RN MARION DIXON AT [address] ON 07/10/20 Performed at [facility] [address], [place], [place] [address]      Radiology Studies: CT CHEST ABDOMEN PELVIS W CONTRAST  Result Date: 07/10/2020 CLINICAL DATA:  Abdominal pain, fever EXAM: CT CHEST, ABDOMEN, AND PELVIS WITH CONTRAST TECHNIQUE: Multidetector CT imaging of the chest, abdomen and pelvis was performed following the standard protocol during bolus administration of intravenous contrast. CONTRAST:  116mL OMNIPAQUE IOHEXOL 300 MG/ML  SOLN COMPARISON:  None. FINDINGS: CT CHEST FINDINGS Cardiovascular: No significant coronary artery calcification. Global cardiac size within normal limits. No pericardial effusion. The central pulmonary arteries are of normal caliber. The thoracic aorta is unremarkable. Mediastinum/Nodes: 2.0 cm indeterminate nodule noted within the inferior left thyroid lobe. No pathologic thoracic adenopathy. The esophagus is unremarkable. 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 Lungs/Pleura: There is bibasilar consolidation involving the dependent lower lobes as well as scattered multifocal pulmonary infiltrates within the lingula, posterior segment of the right upper lobe, and aerated portion of the lower lobes bilaterally. Differential considerations are led by aspiration and atypical infection. No pneumothorax or pleural effusion. No central obstructing lesion. Musculoskeletal: No acute bone abnormality within the thorax. No lytic or blastic bone lesion. Cervical fusion hardware is partially visualized. CT ABDOMEN PELVIS FINDINGS Hepatobiliary: The liver contour is nodular and there is hypertrophy of the left hepatic lobe in keeping with cirrhosis. Heterogeneous enhancement of the hepatic parenchyma may relate to  underlying parenchymal fibrosis or regenerative nodularity. No discrete focal enhancing intrahepatic mass is identified. Tiny cyst noted within segment 8. Trace perihepatic and pericholecystic ascites. Gallbladder otherwise unremarkable. No intra or extrahepatic biliary ductal dilation. Pancreas: Unremarkable Spleen: Mild splenomegaly is present with the spleen measuring 14.2 cm in greatest dimension. No intrasplenic lesion. The splenic vein is patent. Adrenals/Urinary Tract: Adrenal glands are unremarkable. Kidneys are normal, without renal calculi, focal lesion, or hydronephrosis. Bladder is unremarkable. Stomach/Bowel: The stomach, small bowel, and large bowel are unremarkable. The appendix is not clearly identified and may be absent. No free intraperitoneal gas. Vascular/Lymphatic: Mild aortoiliac atherosclerotic calcification. No aortic aneurysm. No pathologic adenopathy within the abdomen and pelvis. Reproductive: Tubal ligation clips are seen bilaterally. The pelvic organs are otherwise unremarkable. Other: Tiny fat containing right inguinal hernia. Rectum unremarkable. Musculoskeletal: L4-5 posterior lumbar fusion and decompression has been performed. No acute bone abnormality. No lytic or blastic bone lesion. IMPRESSION: Bibasilar pulmonary consolidation multifocal pulmonary infiltrates most in keeping with aspiration or atypical infection in the acute setting. Cirrhosis with evidence of portal venous hypertension with mild splenomegaly, gastroesophageal varices, and trace ascites. No enhancing intrahepatic mass identified. 2 cm left thyroid nodule. Recommend thyroid US (ref: J Am Coll Radiol. 2015 Feb;12(2): 143-50). Aortic Atherosclerosis (ICD10-I70.0). Electronically Signed   By: Fidela Salisbury MD   On: 07/10/2020 23:43   US THYROID  Result Date: 07/11/2020 CLINICAL DATA:  Thyroid nodule EXAM: THYROID ULTRASOUND TECHNIQUE: Ultrasound examination of the thyroid gland and adjacent soft tissues was  performed. COMPARISON:  None. FINDINGS: Parenchymal Echotexture: Mildly heterogenous Isthmus: 3 mm Right lobe: 5.2 x 1.8 x 1.4 cm Left lobe: 4.4 x 1.4 x 1.3 cm _________________________________________________________ Estimated total number of nodules >/= 1 cm: 0 Number of spongiform nodules >/=  2 cm not described  below (TR1): 0 Number of mixed cystic and solid nodules >/= 1.5 cm not described below (Bascom): 0 _________________________________________________________ Very minimal thyroid heterogeneity. There are a few scattered subcentimeter benign cystic nodules, all 9 mm or less in size compatible with colloid cysts. These are not fully described by TI rads criteria. No significant finding that warrants follow-up or biopsy. No hypervascularity. Negative for adenopathy. IMPRESSION: Benign subcentimeter cystic nodules as above. No other significant finding by ultrasound. The above is in keeping with the ACR TI-RADS recommendations - J Am Coll Radiol 2017;14:587-595. Electronically Signed   By: Jerilynn Mages.  Shick M.D.   On: 07/11/2020 13:24   US THYROID  Final Result    CT CHEST ABDOMEN PELVIS W CONTRAST  Final Result    US Abdomen Limited RUQ (LIVER/GB)  Final Result    CT HEAD WO CONTRAST  Final Result    DG Chest Port 1 View  Final Result      Scheduled Meds: . Chlorhexidine Gluconate Cloth  6 each Topical Q0600  . feeding supplement  237 mL Oral BID BM  . folic acid  1 mg Oral Daily  . lactulose  10 g Oral BID  . losartan  50 mg Oral Daily  . magic mouthwash  15 mL Oral TID  . methylPREDNISolone (SOLU-MEDROL) injection  40 mg Intravenous Once  . metoprolol tartrate  5 mg Intravenous Once  . morphine  30 mg Oral Q12H  . multivitamin with minerals  1 tablet Oral Daily  . mupirocin ointment  1 application Nasal BID  . pneumococcal 23 valent vaccine  0.5 mL Intramuscular Tomorrow-1000  . pregabalin  150 mg Oral BID  . vitamin B-12  1,000 mcg Oral Daily   PRN Meds: albuterol,  albuterol Continuous Infusions: . azithromycin 500 mg (07/12/20 0906)  . cefTRIAXone (ROCEPHIN)  IV 2 g (07/11/20 1637)     LOS: 2 days  Time spent: Greater than 50% of the 35 minute visit was spent in counseling/coordination of care for the patient as laid out in the A&P.   Dwyane Dee, MD [facility] 07/12/2020, 2:44 PM

## 2020-07-12 NOTE — Progress Notes (Addendum)
Called into room by primary RN to assist pt after an unwitnessed fall in bathroom. RN had assisted pt to bathroom and remained in room before, during, and after fall. Pt got off of toilet by herself and "spun around too fast" per pt report and fell backwards, hitting her head. We assisted pt back to her feet and back to bed. Pt states she did hit her head- no obvious injuries or neuro deficits. Primary RN paged on-call for [facility] and stat CT of head ordered and pending.  Post-fall flowsheet completed by primary RN, see prior note. Post-fall huddle completed.

## 2020-07-12 NOTE — Progress Notes (Signed)
Patient ID: Anita Price, female   DOB: [birth year], 65 y.o.   MRN: [id number]   Brief GI note   Pt has Appt with Dr Rhea Belton in office  5/24  At 2;30 pm  [facility] - [address]

## 2020-07-12 NOTE — Progress Notes (Signed)
Initial Nutrition Assessment  DOCUMENTATION CODES:   Severe malnutrition in context of chronic illness  INTERVENTION:   Change diet to DYS 3 given poor dentition   Ensure Enlive po BID, each supplement provides 350 kcal and 20 grams of protein  MVI daily   NUTRITION DIAGNOSIS:   Severe Malnutrition related to chronic illness (cirrhosis, poor dentition) as evidenced by severe fat depletion,severe muscle depletion,energy intake < or equal to 75% for > or equal to 1 month.  GOAL:   Patient will meet greater than or equal to 90% of their needs  MONITOR:   PO intake,Supplement acceptance,Weight trends,Labs,I & O's  REASON FOR ASSESSMENT:   Malnutrition Screening Tool    ASSESSMENT:   Patient with PMH significant for DJD, essential HTN, hepatitis C (untreated), and recent gastroenteritis. Presents this admission with new diagnosis of cirrhosis and multifocal PNA.   Patient endorses decreased intake over the last year due to poor dentition. She is unable to purchase dentures at this time. States she consumes two meals daily that consist if softer foods such as taco meat, cheeseburgers, and mashed potatoes. Intake this admission has not been documented but patient states she has finished around 25-50%. She would like to have softer foods sent to promote intake. Discussed the importance of protein intake for preservation of lean body mass. Patient tried Ensure and would like to continue them.   Patient reports having a slender build at baseline. States UBW stays around 113 lbs. Records indicate patient weighed 111 lb at [facility] on 06/30/2020 and show a stated weight of 120 lb this admission. Will need to obtain standing weight to assess for actual weight loss.   Medications: folic acid, lactulose, magic mouthwash TID, Vitamin B12 Labs: reviewed   NUTRITION - FOCUSED PHYSICAL EXAM:  Flowsheet Row Most Recent Value  Orbital Region Severe depletion  Upper Arm Region Severe  depletion  Thoracic and Lumbar Region Unable to assess  Buccal Region Severe depletion  Temple Region Severe depletion  Clavicle Bone Region Severe depletion  Clavicle and Acromion Bone Region Severe depletion  Scapular Bone Region Unable to assess  Dorsal Hand Moderate depletion  Patellar Region Moderate depletion  Anterior Thigh Region Moderate depletion  Posterior Calf Region Moderate depletion  Edema (RD Assessment) Mild  Hair Reviewed  Eyes Reviewed  Mouth Reviewed  Skin Reviewed  Nails Reviewed     Diet Order:   Diet Order            Diet Heart Room service appropriate? Yes with Assist; Fluid consistency: Thin  Diet effective now                 EDUCATION NEEDS:   Education needs have been addressed  Skin:  Skin Assessment: Reviewed RN Assessment  Last BM:  4/12  Height:   Ht Readings from Last 1 Encounters:  07/10/20 5\' 5"  (1.651 m)    Weight:   Wt Readings from Last 1 Encounters:  07/10/20 54.4 kg    BMI:  Body mass index is 19.97 kg/m.  Estimated Nutritional Needs:   Kcal:  1650-1850 kcal  Protein:  85-100 grams  Fluid:  >/= 1.6 L/day  09/09/20 RD, LDN Clinical Nutrition Pager listed in AMION

## 2020-07-13 LAB — COMPREHENSIVE METABOLIC PANEL
ALT: 30 U/L (ref 0–44)
AST: 58 U/L — ABNORMAL HIGH (ref 15–41)
Albumin: 2.3 g/dL — ABNORMAL LOW (ref 3.5–5.0)
Alkaline Phosphatase: 81 U/L (ref 38–126)
Anion gap: 8 (ref 5–15)
BUN: 10 mg/dL (ref 8–23)
CO2: 25 mmol/L (ref 22–32)
Calcium: 7.9 mg/dL — ABNORMAL LOW (ref 8.9–10.3)
Chloride: 100 mmol/L (ref 98–111)
Creatinine, Ser: 0.53 mg/dL (ref 0.44–1.00)
GFR, Estimated: >60 mL/min (ref >60)
Glucose, Bld: 107 mg/dL — ABNORMAL HIGH (ref 70–99)
Potassium: 3.7 mmol/L (ref 3.5–5.1)
Sodium: 133 mmol/L — ABNORMAL LOW (ref 135–145)
Total Bilirubin: 1.6 mg/dL — ABNORMAL HIGH (ref 0.3–1.2)
Total Protein: 5.2 g/dL — ABNORMAL LOW (ref 6.5–8.1)

## 2020-07-13 LAB — CBC WITH DIFFERENTIAL/PLATELET
Abs Immature Granulocytes: 0.14 10*3/uL — ABNORMAL HIGH (ref 0.00–0.07)
Basophils Absolute: 0 10*3/uL (ref 0.0–0.1)
Basophils Relative: 0 %
Eosinophils Absolute: 0.1 10*3/uL (ref 0.0–0.5)
Eosinophils Relative: 1 %
HCT: 34.9 % — ABNORMAL LOW (ref 36.0–46.0)
Hemoglobin: 12.1 g/dL (ref 12.0–15.0)
Immature Granulocytes: 2 %
Lymphocytes Relative: 11 %
Lymphs Abs: 0.9 10*3/uL (ref 0.7–4.0)
MCH: 34.3 pg — ABNORMAL HIGH (ref 26.0–34.0)
MCHC: 34.7 g/dL (ref 30.0–36.0)
MCV: 98.9 fL (ref 80.0–100.0)
Monocytes Absolute: 0.8 10*3/uL (ref 0.1–1.0)
Monocytes Relative: 10 %
Neutro Abs: 6.2 10*3/uL (ref 1.7–7.7)
Neutrophils Relative %: 76 %
Platelets: 86 10*3/uL — ABNORMAL LOW (ref 150–400)
RBC: 3.53 MIL/uL — ABNORMAL LOW (ref 3.87–5.11)
RDW: 12.9 % (ref 11.5–15.5)
WBC: 8.3 10*3/uL (ref 4.0–10.5)
nRBC: 0 % (ref 0.0–0.2)

## 2020-07-13 LAB — HCV RNA QUANT
HCV Quantitative Log: 6.322 log10 IU/mL (ref >1.70)
HCV Quantitative: 2100000 IU/mL (ref >50)

## 2020-07-13 LAB — AFP TUMOR MARKER: AFP, Serum, Tumor Marker: 12.2 ng/mL — ABNORMAL HIGH (ref 0.0–9.2)

## 2020-07-13 LAB — MAGNESIUM: Magnesium: 2 mg/dL (ref 1.7–2.4)

## 2020-07-13 MED ORDER — CYANOCOBALAMIN 1000 MCG PO TABS: 1000.0000 ug | ORAL_TABLET | Freq: Every day | ORAL | 3 refills | Status: DC

## 2020-07-13 MED ORDER — LOSARTAN POTASSIUM 50 MG PO TABS: 50.0000 mg | ORAL_TABLET | Freq: Every day | ORAL | 3 refills | Status: DC

## 2020-07-13 MED ORDER — ADULT MULTIVITAMIN W/MINERALS CH: 1.0000 | ORAL_TABLET | Freq: Every day | ORAL | Status: DC

## 2020-07-13 MED ORDER — LACTULOSE 10 GM/15ML PO SOLN: 10.0000 g | Freq: Two times a day (BID) | ORAL | 3 refills | Status: DC

## 2020-07-13 NOTE — TOC Transition Note (Signed)
Transition of Care (TOC) - CM/SW Discharge Note Donn Pierini RN, BSN Transitions of Care Unit 4E- RN Case Manager See Treatment Team for direct phone #      Patient Details  Name: Anita Price MRN: [id number] Date of Birth: October 26, 1955  Transition of Care [facility]) CM/SW Contact:  Darrold Span, RN Phone Number: 07/13/2020, 11:40 AM   Clinical Narrative:    Referral for PCP and insurance needs. Pt has active Medicare A/B, also showing that she has a PCP- attempted to call PCP office to schedule f/u appointment however PCP office informed us that patient would need to call herself as the office needed to clear something up with patient. Pt also provided info on [facility] should she want to find a new primary care provider.   Info provided on AVS regarding smoking Smoking Cessation Information.     Final next level of care: Home/Self Care Barriers to Discharge: No Barriers Identified   Patient Goals and CMS Choice     Choice offered to / list presented to : NA  Discharge Placement               Home        Discharge Plan and Services   Discharge Planning Services: CM Consult Post Acute Care Choice: NA          DME Arranged: N/A DME Agency: NA       HH Arranged: NA HH Agency: NA        Social Determinants of Health (SDOH) Interventions     Readmission Risk Interventions Readmission Risk Prevention Plan 07/13/2020  Post Dischage Appt Not Complete  Appt Comments attempted to call PCP to schedule f/u- however office informed us that patient would need to call for appointment  Medication Screening Complete  Transportation Screening Complete  Some recent data might be hidden

## 2020-07-13 NOTE — Care Management Important Message (Signed)
Important Message  Patient Details  Name: Anita Price MRN: [id number] Date of Birth: [birth year]   Medicare Important Message Given:  Yes     Gyneth Hubka Stefan Church 07/13/2020, 3:40 PM

## 2020-07-13 NOTE — Progress Notes (Signed)
Pt D/C from unit. discharge paperwork given to pt and daughter. No questions   Everlean Cherry, RN

## 2020-07-13 NOTE — Plan of Care (Signed)
  Problem: Clinical Measurements: Goal: Will remain free from infection Outcome: Progressing Goal: Diagnostic test results will improve Outcome: Progressing Goal: Respiratory complications will improve Outcome: Progressing   Problem: Health Behavior/Discharge Planning: Goal: Ability to manage health-related needs will improve Outcome: Not Progressing

## 2020-07-13 NOTE — Discharge Summary (Signed)
Physician Discharge Summary   Anita Price CBJ:[id number] DOB: [birth year] DOA: 07/10/2020  PCP: Anita Mccreedy, MD  Admit date: 07/10/2020 Discharge date: 07/13/2020   Admitted From: home Disposition:  home Discharging physician: Anita Dee, MD  Recommendations for Outpatient Follow-up:  1. Follow up with GI   Patient discharged to home in Discharge Condition: stable Risk of unplanned readmission score: Unplanned Admission- Pilot do not use: 13.92  CODE STATUS: Full Diet recommendation:  Diet Orders (From admission, onward)    Start     Ordered   07/13/20 0000  Diet - low sodium heart healthy        07/13/20 1016   07/12/20 1344  DIET DYS 3 Room service appropriate? Yes; Fluid consistency: Thin  Diet effective now       Question Answer Comment  Room service appropriate? Yes   Fluid consistency: Thin      07/12/20 1343          Hospital Course: Anita Miltner Jacksonis a 65 y.o.femalewith medical history significant ofDJD of multiple sites, chronic back pain, history of back surgery, chronic hepatitis C, tobacco abuse, essential hypertension who was brought to the emergency department due toaltered mental status. The patient's daughter stated that she has had gastroenteritis about 2 weeks ago and has not been the same since then. Her appetite has been decreased. She has been confused and at times her speech has been slurred.  Patient was febrile on arrival at 100.9, tachycardic and tachypneic, chest x-ray concerning for multifocal pneumonia although no respiratory symptoms, elevated lactic acid at 4>1.8, t bili 2.7,INR 1.3, ammonia levels of 55, RUQ [place] with liver cirrhosis. Calcitonin at 2.44 Initially met sepsis criteria most likely secondary to pneumonia. CT chest and abdomenconcerning for multifocal bilateral pneumonia, respiratory viral panel positive for coronavirus NL63, liver cirrhosis with signs of portal hypertension and esophageal varices.  Daughter at  bedside initially also stated that patient not at her baseline with progressive worsening in her mental status for the past few weeks, progressive decline over the past couple of years with some unintentional weight loss.Patient was never a heavy smoker and has a history of some alcohol use. She did endorse ongoing tobacco use.  She has not followed with primary care in several years.  Daughter seems supportive but was very overwhelmed with all those findings as she was not aware of any liver cirrhosis. Patient has an history of chronic hep C which was never treated.No Covid vaccine or other preventive healthcare.No EGD or colonoscopy,no mammography. See below for further problem-based plans.  Acute metabolic encephalopathy/hepatic encephalopathy/liver cirrhosis/questionable multifocal pneumonia. Coronavirus NL63  Altered mental status most likely secondary to hepatic encephalopathy as ammonia levels were elevated (55) with diagnosis of liver cirrhosis. Elevated T bili and INR. Child pugh score of class B and 8 TSH and A1c within normal limit. Although chest x-ray with concern of multifocal pneumonia denies any upper respiratory symptoms, she was febrile and elevated procalcitonin but no leukocytosis. She received cefepime, vancomycin, metronidazole in ED and continued with cefepime, vancomycin and Zithromax. - supportive care for the viral infection; given cirrhosis and elevated PCT initially, finishing out abx course; considered completed inpatient prior to discharge -Follow-up blood and urine cultures- remain negative. -Continuelactulose (dose adjusted).  Patient instructed on bowel movement target/goal with lactulose use - appreciate GI assistance; outpatient follow up for further cirrhosis/HCV workup   Thrombocytopenia.Most likely secondary to liver disease. No sign of active bleeding. Platelets stable.No baseline as it was within normal limit  9 years ago, no blood work-up  since then. -Continue to monitor  Mechanical fall -Patient fell after turning around in the bathroom losing her balance on the night of 07/12/2020.  CT head unremarkable and no further complaints noted the following morning. -Patient instructed for caution at home with position changes especially with underlying cirrhosis and thrombocytopenia  Hypertension. Blood pressure elevated.  Patient had not been on medications prior to admission -Continue losartan  Chronic back pain.Apparently patient was on high-dose of OxyContin 30 mg every 8 hourly along with Lyrica 300 mg twice daily which was being given to her at the pain clinic for her chronic back pain. -Restart home OxyContin and Lyrica -Adjustment outpatient per provider  Protein caloric malnutrition.Albumin at 2.9 and patient appears malnourished with muscle wasting. Body mass index is 19.97 kg/m. -Dietitian consult  Incidental finding of thyroid nodule.  Seen on CT.  Thyroid ultrasound shows benign subcentimeter cystic nodules   The patient's chronic medical conditions were treated accordingly per the patient's home medication regimen except as noted.  On day of discharge, patient was felt deemed stable for discharge. Patient/family member advised to call PCP or come back to ER if needed.   Principal Diagnosis: Sepsis due to pneumonia [facility])  Discharge Diagnoses: Active Hospital Problems   Diagnosis Date Noted  . Sepsis due to pneumonia ([place]) 07/10/2020  . Protein-calorie malnutrition, severe 07/12/2020  . Hepatic cirrhosis ([place])   . Essential hypertension 07/10/2020  . Hepatitis C 07/10/2020  . Acute metabolic encephalopathy 54/65/0354  . Tobacco use 07/10/2020  . Macrocytosis 07/10/2020  . Hyponatremia 07/10/2020  . Hyperglycemia 07/10/2020  . Prolonged QT interval 07/10/2020    Resolved Hospital Problems  No resolved problems to display.    Discharge Instructions    Diet - low sodium heart healthy    Complete by: As directed    Increase activity slowly   Complete by: As directed      Allergies as of 07/13/2020      Reactions   Effersyllium [psyllium]    Makes pt feel bad    Neurontin [gabapentin]    Makes pt feel bad   Tramadol    Makes pt feel bad      Medication List    STOP taking these medications   acetaminophen 500 MG tablet Commonly known as: TYLENOL     TAKE these medications   albuterol 108 (90 Base) MCG/ACT inhaler Commonly known as: VENTOLIN HFA Inhale into the lungs every 6 (six) hours as needed for wheezing or shortness of breath.   cyanocobalamin 1000 MCG tablet Take 1 tablet (1,000 mcg total) by mouth daily. Start taking on: July 14, 2020   lactulose 10 GM/15ML solution Commonly known as: CHRONULAC Take 15 mLs (10 g total) by mouth 2 (two) times daily. Goal 2-3 soft formed bowel movements daily   losartan 50 MG tablet Commonly known as: COZAAR Take 1 tablet (50 mg total) by mouth daily. Start taking on: July 14, 2020   morphine 30 MG 12 hr tablet Commonly known as: MS CONTIN Take 30 mg by mouth every 8 (eight) hours.   multivitamin with minerals Tabs tablet Take 1 tablet by mouth daily. Start taking on: July 14, 2020   pregabalin 300 MG capsule Commonly known as: LYRICA Take 300 mg by mouth in the morning and at bedtime.       Follow-up Information    Osei-Bonsu, Iona Beard, MD. Schedule an appointment as soon as possible for a visit in 1 week(s).  Specialty: Internal Medicine Contact information: [address] [place] [address] [phone]        Jerene Bears, MD. Go on 08/22/2020.   Specialty: Gastroenterology Why: 2:30 pm Contact information: [address] [address] [phone]              Allergies  Allergen Reactions  . Effersyllium [Psyllium]     Makes pt feel bad   . Neurontin [Gabapentin]     Makes pt feel bad  . Tramadol     Makes pt feel bad     Consultations: GI  Discharge Exam: BP 134/80 (BP Location: Left Arm)   Pulse 84   Temp 98.7 F (37.1 C) (Oral)   Resp 17   Ht _0  (1.651 m)   Wt 54.4 kg   SpO2 93%   BMI 19.97 kg/m  General appearance: alert, cooperative and no distress Head: Normocephalic, without obvious abnormality, atraumatic Eyes: EOMI Lungs: clear to auscultation bilaterally Heart: regular rate and rhythm and S1, S2 normal Abdomen: normal findings: bowel sounds normal and soft, non-tender Extremities: no edema Skin: mobility and turgor normal Neurologic: Grossly normal  The results of significant diagnostics from this hospitalization (including imaging, microbiology, ancillary and laboratory) are listed below for reference.   Microbiology: Recent Results (from the past 240 hour(s))  Blood Culture (routine x 2)     Status: None (Preliminary result)   Collection Time: 07/10/20  1:33 AM   Specimen: BLOOD  Result Value Ref Range Status   Specimen Description BLOOD SITE NOT SPECIFIED  Final   Special Requests   Final    BOTTLES DRAWN AEROBIC AND ANAEROBIC Blood Culture adequate volume   Culture   Final    NO GROWTH 3 DAYS Performed at [facility], [address], [place], [place] [address]    Report Status PENDING  Incomplete  Resp Panel by RT-PCR (Flu A&B, Covid) Nasopharyngeal Swab     Status: None   Collection Time: 07/10/20  1:47 AM   Specimen: Nasopharyngeal Swab; Nasopharyngeal(NP) swabs in vial transport medium  Result Value Ref Range Status   SARS Coronavirus 2 by RT PCR NEGATIVE NEGATIVE Final    Comment: (NOTE) SARS-CoV-2 target nucleic acids are NOT DETECTED.  The SARS-CoV-2 RNA is generally detectable in upper respiratory specimens during the acute phase of infection. The lowest concentration of SARS-CoV-2 viral copies this assay can detect is 138 copies/mL. A negative result does not preclude SARS-Cov-2 infection and should not be used as the sole basis for treatment  or other patient management decisions. A negative result may occur with  improper specimen collection/handling, submission of specimen other than nasopharyngeal swab, presence of viral mutation(s) within the areas targeted by this assay, and inadequate number of viral copies(<138 copies/mL). A negative result must be combined with clinical observations, patient history, and epidemiological information. The expected result is Negative.  Fact Sheet for Patients:  EntrepreneurPulse.com.au  Fact Sheet for Healthcare Providers:  IncredibleEmployment.be  This test is no t yet approved or cleared by the [place] FDA and  has been authorized for detection and/or diagnosis of SARS-CoV-2 by FDA under an Emergency Use Authorization (EUA). This EUA will remain  in effect (meaning this test can be used) for the duration of the COVID-19 declaration under Section 564(b)(1) of the Act, 21 U.S.C.section 360bbb-3(b)(1), unless the authorization is terminated  or revoked sooner.       Influenza A by PCR NEGATIVE NEGATIVE Final  Influenza B by PCR NEGATIVE NEGATIVE Final    Comment: (NOTE) The Xpert Xpress SARS-CoV-2/FLU/RSV plus assay is intended as an aid in the diagnosis of influenza from Nasopharyngeal swab specimens and should not be used as a sole basis for treatment. Nasal washings and aspirates are unacceptable for Xpert Xpress SARS-CoV-2/FLU/RSV testing.  Fact Sheet for Patients: EntrepreneurPulse.com.au  Fact Sheet for Healthcare Providers: IncredibleEmployment.be  This test is not yet approved or cleared by the [place] FDA and has been authorized for detection and/or diagnosis of SARS-CoV-2 by FDA under an Emergency Use Authorization (EUA). This EUA will remain in effect (meaning this test can be used) for the duration of the COVID-19 declaration under Section 564(b)(1) of the Act, 21 U.S.C. section  360bbb-3(b)(1), unless the authorization is terminated or revoked.  Performed at [facility], [place] [address] [place], [place] [address]   Respiratory (~20 pathogens) panel by PCR     Status: Abnormal   Collection Time: 07/10/20  1:47 AM   Specimen: Nasopharyngeal Swab; Respiratory  Result Value Ref Range Status   Adenovirus NOT DETECTED NOT DETECTED Final   Coronavirus 229E NOT DETECTED NOT DETECTED Final    Comment: (NOTE) The Coronavirus on the Respiratory Panel, DOES NOT test for the novel  Coronavirus (2019 nCoV)    Coronavirus HKU1 NOT DETECTED NOT DETECTED Final   Coronavirus NL63 DETECTED (A) NOT DETECTED Final   Coronavirus OC43 NOT DETECTED NOT DETECTED Final   Metapneumovirus NOT DETECTED NOT DETECTED Final   Rhinovirus / Enterovirus NOT DETECTED NOT DETECTED Final   Influenza A NOT DETECTED NOT DETECTED Final   Influenza B NOT DETECTED NOT DETECTED Final   Parainfluenza Virus 1 NOT DETECTED NOT DETECTED Final   Parainfluenza Virus 2 NOT DETECTED NOT DETECTED Final   Parainfluenza Virus 3 NOT DETECTED NOT DETECTED Final   Parainfluenza Virus 4 NOT DETECTED NOT DETECTED Final   Respiratory Syncytial Virus NOT DETECTED NOT DETECTED Final   Bordetella pertussis NOT DETECTED NOT DETECTED Final   Bordetella Parapertussis NOT DETECTED NOT DETECTED Final   Chlamydophila pneumoniae NOT DETECTED NOT DETECTED Final   Mycoplasma pneumoniae NOT DETECTED NOT DETECTED Final    Comment: Performed at [facility], [place]. [address] [place], [place] [address]  Urine culture     Status: None   Collection Time: 07/10/20  3:57 AM   Specimen: In/Out Cath Urine  Result Value Ref Range Status   Specimen Description IN/OUT CATH URINE  Final   Special Requests NONE  Final   Culture   Final    NO GROWTH Performed at [facility], [place] [address] [place], [place] [address]    Report Status 07/11/2020 FINAL  Final  Culture, blood (Routine X 2) w Reflex to ID Panel      Status: None (Preliminary result)   Collection Time: 07/10/20  7:33 AM   Specimen: BLOOD LEFT HAND  Result Value Ref Range Status   Specimen Description BLOOD LEFT HAND  Final   Special Requests   Final    BOTTLES DRAWN AEROBIC AND ANAEROBIC Blood Culture results may not be optimal due to an inadequate volume of blood received in culture bottles   Culture   Final    NO GROWTH 3 DAYS Performed at [facility], [place] [address] [place], [place] [address]    Report Status PENDING  Incomplete  MRSA PCR Screening     Status: Abnormal   Collection Time: 07/10/20  8:51  AM   Specimen: Nasal Mucosa; Nasopharyngeal  Result Value Ref Range Status   MRSA by PCR POSITIVE (A) NEGATIVE Final    Comment:        The GeneXpert MRSA Assay (FDA approved for NASAL specimens only), is one component of a comprehensive MRSA colonization surveillance program. It is not intended to diagnose MRSA infection nor to guide or monitor treatment for MRSA infections. CRITICAL RESULT CALLED TO, READ BACK BY AND VERIFIED WITH: RN MARION DIXON AT [address] ON 07/10/20 Performed at [facility] [address], [place], [place] [address]      Labs: BNP (last 3 results) No results for input(s): BNP in the last 8760 hours. Basic Metabolic Panel: Recent Labs  Lab 07/10/20 0132 07/10/20 0135 07/10/20 0600 07/10/20 0855 07/11/20 0729 07/12/20 0311 07/13/20 0136  NA 132* 135 134*  --  139 136 133*  K 4.5 4.5 2.9*  --  2.9* 3.9 3.7  CL 102  --  111  --  106 106 100  CO2 19*  --  17*  --  21* 25 25  GLUCOSE 164*  --  110*  --  137* 94 107*  BUN 15  --  9  --  _0 CREATININE 0.65  --  0.52  --  0.54 0.62 0.53  CALCIUM 8.7*  --  7.3*  --  8.5* 8.2* 7.9*  MG  --   --   --  1.8  --  2.3 2.0   Liver Function Tests: Recent Labs  Lab 07/10/20 0132 07/10/20 0600 07/11/20 0729 07/13/20 0136  AST 65* 40 47* 58*  ALT 37 _1 ALKPHOS 62 53 101 81  BILITOT 2.7* 2.6* 1.9* 1.6*  PROT 6.5  5.2* 6.5 5.2*  ALBUMIN 3.5 2.9* 3.1* 2.3*   No results for input(s): LIPASE, AMYLASE in the last 168 hours. Recent Labs  Lab 07/10/20 0733  AMMONIA 55*   CBC: Recent Labs  Lab 07/10/20 0132 07/10/20 0135 07/10/20 0733 07/11/20 0729 07/12/20 0311 07/13/20 0136  WBC 10.3  --  7.4 9.7 8.8 8.3  NEUTROABS 8.6*  --  6.2 8.3* 6.6 6.2  HGB 14.7 15.3* 13.1 15.1* 13.1 12.1  HCT 42.7 45.0 37.9 43.1 37.7 34.9*  MCV 102.9*  --  99.5 98.4 99.0 98.9  PLT 73*  --  61* 85* 84* 86*   Cardiac Enzymes: No results for input(s): CKTOTAL, CKMB, CKMBINDEX, TROPONINI in the last 168 hours. BNP: Invalid input(s): POCBNP CBG: No results for input(s): GLUCAP in the last 168 hours. D-Dimer No results for input(s): DDIMER in the last 72 hours. Hgb A1c No results for input(s): HGBA1C in the last 72 hours. Lipid Profile No results for input(s): CHOL, HDL, LDLCALC, TRIG, CHOLHDL, LDLDIRECT in the last 72 hours. Thyroid function studies No results for input(s): TSH, T4TOTAL, T3FREE, THYROIDAB in the last 72 hours.  Invalid input(s): FREET3 Anemia work up No results for input(s): VITAMINB12, FOLATE, FERRITIN, TIBC, IRON, RETICCTPCT in the last 72 hours. Urinalysis    Component Value Date/Time   COLORURINE AMBER (A) 07/10/2020 0354   APPEARANCEUR CLEAR 07/10/2020 0354   LABSPEC 1.023 07/10/2020 0354   PHURINE 6.0 07/10/2020 0354   GLUCOSEU NEGATIVE 07/10/2020 0354   HGBUR MODERATE (A) 07/10/2020 0354   BILIRUBINUR NEGATIVE 07/10/2020 0354   KETONESUR NEGATIVE 07/10/2020 0354   PROTEINUR 30 (A) 07/10/2020 0354   UROBILINOGEN 0.2 12/17/2010 1447   NITRITE NEGATIVE 07/10/2020 0354   LEUKOCYTESUR  NEGATIVE 07/10/2020 0354   Sepsis Labs Invalid input(s): PROCALCITONIN,  WBC,  LACTICIDVEN Microbiology Recent Results (from the past 240 hour(s))  Blood Culture (routine x 2)     Status: None (Preliminary result)   Collection Time: 07/10/20  1:33 AM   Specimen: BLOOD  Result Value Ref Range Status    Specimen Description BLOOD SITE NOT SPECIFIED  Final   Special Requests   Final    BOTTLES DRAWN AEROBIC AND ANAEROBIC Blood Culture adequate volume   Culture   Final    NO GROWTH 3 DAYS Performed at [facility], [address] [place], [place] [address]    Report Status PENDING  Incomplete  Resp Panel by RT-PCR (Flu A&B, Covid) Nasopharyngeal Swab     Status: None   Collection Time: 07/10/20  1:47 AM   Specimen: Nasopharyngeal Swab; Nasopharyngeal(NP) swabs in vial transport medium  Result Value Ref Range Status   SARS Coronavirus 2 by RT PCR NEGATIVE NEGATIVE Final    Comment: (NOTE) SARS-CoV-2 target nucleic acids are NOT DETECTED.  The SARS-CoV-2 RNA is generally detectable in upper respiratory specimens during the acute phase of infection. The lowest concentration of SARS-CoV-2 viral copies this assay can detect is 138 copies/mL. A negative result does not preclude SARS-Cov-2 infection and should not be used as the sole basis for treatment or other patient management decisions. A negative result may occur with  improper specimen collection/handling, submission of specimen other than nasopharyngeal swab, presence of viral mutation(s) within the areas targeted by this assay, and inadequate number of viral copies(<138 copies/mL). A negative result must be combined with clinical observations, patient history, and epidemiological information. The expected result is Negative.  Fact Sheet for Patients:  EntrepreneurPulse.com.au  Fact Sheet for Healthcare Providers:  IncredibleEmployment.be  This test is no t yet approved or cleared by the [place] FDA and  has been authorized for detection and/or diagnosis of SARS-CoV-2 by FDA under an Emergency Use Authorization (EUA). This EUA will remain  in effect (meaning this test can be used) for the duration of the COVID-19 declaration under Section 564(b)(1) of the Act, 21 U.S.C.section  360bbb-3(b)(1), unless the authorization is terminated  or revoked sooner.       Influenza A by PCR NEGATIVE NEGATIVE Final   Influenza B by PCR NEGATIVE NEGATIVE Final    Comment: (NOTE) The Xpert Xpress SARS-CoV-2/FLU/RSV plus assay is intended as an aid in the diagnosis of influenza from Nasopharyngeal swab specimens and should not be used as a sole basis for treatment. Nasal washings and aspirates are unacceptable for Xpert Xpress SARS-CoV-2/FLU/RSV testing.  Fact Sheet for Patients: EntrepreneurPulse.com.au  Fact Sheet for Healthcare Providers: IncredibleEmployment.be  This test is not yet approved or cleared by the [place] FDA and has been authorized for detection and/or diagnosis of SARS-CoV-2 by FDA under an Emergency Use Authorization (EUA). This EUA will remain in effect (meaning this test can be used) for the duration of the COVID-19 declaration under Section 564(b)(1) of the Act, 21 U.S.C. section 360bbb-3(b)(1), unless the authorization is terminated or revoked.  Performed at [facility], [place] [address], [place], [place] [address]   Respiratory (~20 pathogens) panel by PCR     Status: Abnormal   Collection Time: 07/10/20  1:47 AM   Specimen: Nasopharyngeal Swab; Respiratory  Result Value Ref Range Status   Adenovirus NOT DETECTED NOT DETECTED Final   Coronavirus 229E NOT DETECTED NOT DETECTED Final    Comment: (NOTE) The  Coronavirus on the Respiratory Panel, DOES NOT test for the novel  Coronavirus (2019 nCoV)    Coronavirus HKU1 NOT DETECTED NOT DETECTED Final   Coronavirus NL63 DETECTED (A) NOT DETECTED Final   Coronavirus OC43 NOT DETECTED NOT DETECTED Final   Metapneumovirus NOT DETECTED NOT DETECTED Final   Rhinovirus / Enterovirus NOT DETECTED NOT DETECTED Final   Influenza A NOT DETECTED NOT DETECTED Final   Influenza B NOT DETECTED NOT DETECTED Final   Parainfluenza Virus 1 NOT DETECTED NOT DETECTED  Final   Parainfluenza Virus 2 NOT DETECTED NOT DETECTED Final   Parainfluenza Virus 3 NOT DETECTED NOT DETECTED Final   Parainfluenza Virus 4 NOT DETECTED NOT DETECTED Final   Respiratory Syncytial Virus NOT DETECTED NOT DETECTED Final   Bordetella pertussis NOT DETECTED NOT DETECTED Final   Bordetella Parapertussis NOT DETECTED NOT DETECTED Final   Chlamydophila pneumoniae NOT DETECTED NOT DETECTED Final   Mycoplasma pneumoniae NOT DETECTED NOT DETECTED Final    Comment: Performed at [facility], [place] [address] [place], [place] [address]  Urine culture     Status: None   Collection Time: 07/10/20  3:57 AM   Specimen: In/Out Cath Urine  Result Value Ref Range Status   Specimen Description IN/OUT CATH URINE  Final   Special Requests NONE  Final   Culture   Final    NO GROWTH Performed at [facility], [place] [address], [place], [place] [address]    Report Status 07/11/2020 FINAL  Final  Culture, blood (Routine X 2) w Reflex to ID Panel     Status: None (Preliminary result)   Collection Time: 07/10/20  7:33 AM   Specimen: BLOOD LEFT HAND  Result Value Ref Range Status   Specimen Description BLOOD LEFT HAND  Final   Special Requests   Final    BOTTLES DRAWN AEROBIC AND ANAEROBIC Blood Culture results may not be optimal due to an inadequate volume of blood received in culture bottles   Culture   Final    NO GROWTH 3 DAYS Performed at [facility], [place] [address] [place], [place] [address]    Report Status PENDING  Incomplete  MRSA PCR Screening     Status: Abnormal   Collection Time: 07/10/20  8:51 AM   Specimen: Nasal Mucosa; Nasopharyngeal  Result Value Ref Range Status   MRSA by PCR POSITIVE (A) NEGATIVE Final    Comment:        The GeneXpert MRSA Assay (FDA approved for NASAL specimens only), is one component of a comprehensive MRSA colonization surveillance program. It is not intended to diagnose MRSA infection nor to guide or monitor treatment  for MRSA infections. CRITICAL RESULT CALLED TO, READ BACK BY AND VERIFIED WITH: RN MARION DIXON AT [address] [place] ON 07/10/20 Performed at [facility] [address] [place], [place] [address]     Procedures/Studies: CT HEAD WO CONTRAST  Result Date: 07/13/2020 CLINICAL DATA:  [address] in bathroom EXAM: CT HEAD WITHOUT CONTRAST TECHNIQUE: Contiguous axial images were obtained from the base of the skull through the vertex without intravenous contrast. COMPARISON:  CT brain 07/10/2020 FINDINGS: Brain: No acute territorial infarction, hemorrhage or intracranial mass. The ventricles are nonenlarged. Mild atrophy. Vascular: No hyperdense vessels.  No unexpected calcification Skull: Normal. Negative for fracture or focal lesion. Sinuses/Orbits: Patchy mucosal thickening in the ethmoid sinuses Other: None IMPRESSION: 1. No CT evidence for acute intracranial abnormality. 2. Mild atrophy. Electronically Signed  By: Donavan Foil M.D.   On: 07/13/2020 00:16   CT HEAD WO CONTRAST  Result Date: 07/10/2020 CLINICAL DATA:  Altered mental status EXAM: CT HEAD WITHOUT CONTRAST TECHNIQUE: Contiguous axial images were obtained from the base of the skull through the vertex without intravenous contrast. COMPARISON:  None. FINDINGS: Brain: Normal anatomic configuration. Parenchymal volume loss is commensurate with the patient's age. No abnormal intra or extra-axial mass lesion or fluid collection. No abnormal mass effect or midline shift. No evidence of acute intracranial hemorrhage or infarct. Ventricular size is normal. Cerebellum unremarkable. Vascular: No asymmetric hyperdense vasculature at the skull base. Skull: Intact Sinuses/Orbits: Paranasal sinuses are clear. Orbits are unremarkable. Other: Mastoid air cells and middle ear cavities are clear. IMPRESSION: No acute intracranial hemorrhage or infarct. Electronically Signed   By: Fidela Salisbury MD   On: 07/10/2020 05:23   CT CHEST ABDOMEN PELVIS W  CONTRAST  Result Date: 07/10/2020 CLINICAL DATA:  Abdominal pain, fever EXAM: CT CHEST, ABDOMEN, AND PELVIS WITH CONTRAST TECHNIQUE: Multidetector CT imaging of the chest, abdomen and pelvis was performed following the standard protocol during bolus administration of intravenous contrast. CONTRAST:  141m OMNIPAQUE IOHEXOL 300 MG/ML  SOLN COMPARISON:  None. FINDINGS: CT CHEST FINDINGS Cardiovascular: No significant coronary artery calcification. Global cardiac size within normal limits. No pericardial effusion. The central pulmonary arteries are of normal caliber. The thoracic aorta is unremarkable. Mediastinum/Nodes: 2.0 cm indeterminate nodule noted within the inferior left thyroid lobe. No pathologic thoracic adenopathy. The esophagus is unremarkable. 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 Lungs/Pleura: There is bibasilar consolidation involving the dependent lower lobes as well as scattered multifocal pulmonary infiltrates within the lingula, posterior segment of the right upper lobe, and aerated portion of the lower lobes bilaterally. Differential considerations are led by aspiration and atypical infection. No pneumothorax or pleural effusion. No central obstructing lesion. Musculoskeletal: No acute bone abnormality within the thorax. No lytic or blastic bone lesion. Cervical fusion hardware is partially visualized. CT ABDOMEN PELVIS FINDINGS Hepatobiliary: The liver contour is nodular and there is hypertrophy of the left hepatic lobe in keeping with cirrhosis. Heterogeneous enhancement of the hepatic parenchyma may relate to underlying parenchymal fibrosis or regenerative nodularity. No discrete focal enhancing intrahepatic mass is identified. Tiny cyst noted within segment 8. Trace perihepatic and pericholecystic ascites. Gallbladder otherwise unremarkable. No intra or extrahepatic biliary ductal dilation. Pancreas: Unremarkable Spleen: Mild splenomegaly is present with the spleen measuring 14.2  cm in greatest dimension. No intrasplenic lesion. The splenic vein is patent. Adrenals/Urinary Tract: Adrenal glands are unremarkable. Kidneys are normal, without renal calculi, focal lesion, or hydronephrosis. Bladder is unremarkable. Stomach/Bowel: The stomach, small bowel, and large bowel are unremarkable. The appendix is not clearly identified and may be absent. No free intraperitoneal gas. Vascular/Lymphatic: Mild aortoiliac atherosclerotic calcification. No aortic aneurysm. No pathologic adenopathy within the abdomen and pelvis. Reproductive: Tubal ligation clips are seen bilaterally. The pelvic organs are otherwise unremarkable. Other: Tiny fat containing right inguinal hernia. Rectum unremarkable. Musculoskeletal: L4-5 posterior lumbar fusion and decompression has been performed. No acute bone abnormality. No lytic or blastic bone lesion. IMPRESSION: Bibasilar pulmonary consolidation multifocal pulmonary infiltrates most in keeping with aspiration or atypical infection in the acute setting. Cirrhosis with evidence of portal venous hypertension with mild splenomegaly, gastroesophageal varices, and trace ascites. No enhancing intrahepatic mass identified. 2 cm left thyroid nodule. Recommend thyroid [place](ref: J Am Coll Radiol. 2015 Feb;12(2): 143-50). Aortic Atherosclerosis (ICD10-I70.0). Electronically Signed   By: AFidela SalisburyMD  On: 07/10/2020 23:43   DG Chest Port 1 View  Result Date: 07/10/2020 CLINICAL DATA:  Sepsis EXAM: PORTABLE CHEST 1 VIEW COMPARISON:  06/29/2013 FINDINGS: Ground-glass opacity and patchy consolidations within the bilateral mid to lower lungs consistent with pneumonia. No pleural effusion. Normal heart size. No pneumothorax IMPRESSION: Ground-glass opacity and patchy consolidations within the bilateral mid to lower lungs consistent with pneumonia. Electronically Signed   By: Donavan Foil M.D.   On: 07/10/2020 01:20   US THYROID  Result Date: 07/11/2020 CLINICAL DATA:   Thyroid nodule EXAM: THYROID ULTRASOUND TECHNIQUE: Ultrasound examination of the thyroid gland and adjacent soft tissues was performed. COMPARISON:  None. FINDINGS: Parenchymal Echotexture: Mildly heterogenous Isthmus: 3 mm Right lobe: 5.2 x 1.8 x 1.4 cm Left lobe: 4.4 x 1.4 x 1.3 cm _________________________________________________________ Estimated total number of nodules >/= 1 cm: 0 Number of spongiform nodules >/=  2 cm not described below (TR1): 0 Number of mixed cystic and solid nodules >/= 1.5 cm not described below (TR2): 0 _________________________________________________________ Very minimal thyroid heterogeneity. There are a few scattered subcentimeter benign cystic nodules, all 9 mm or less in size compatible with colloid cysts. These are not fully described by TI rads criteria. No significant finding that warrants follow-up or biopsy. No hypervascularity. Negative for adenopathy. IMPRESSION: Benign subcentimeter cystic nodules as above. No other significant finding by ultrasound. The above is in keeping with the ACR TI-RADS recommendations - J Am Coll Radiol 2017;14:587-595. Electronically Signed   By: Jerilynn Mages.  Shick M.D.   On: 07/11/2020 13:24   US Abdomen Limited RUQ (LIVER/GB)  Result Date: 07/10/2020 CLINICAL DATA:  65 year old female with hepatitis C. EXAM: ULTRASOUND ABDOMEN LIMITED RIGHT UPPER QUADRANT COMPARISON:  None. FINDINGS: Gallbladder: Dependent sludge. Gallbladder wall thickness remains normal. No shadowing stones. No pericholecystic fluid. No sonographic Murphy sign elicited. Common bile duct: Diameter: 2 mm, normal Liver: Highly nodular liver contour (image 15). Coarse echotexture. Background echogenicity within normal limits. Small round hyperechoic lesion in the right hepatic lobe measuring 7 mm on image 34. No other discrete liver lesion. Portal vein is patent on color Doppler imaging with normal direction of blood flow towards the liver. Other: Negative visible right kidney.  No  free fluid. IMPRESSION: 1. Nodular, cirrhotic liver. Solitary 7 mm echogenic focus in the right hepatic lobe most suggestive of small benign hemangioma. Recommend follow-up outpatient Liver MRI (without and with contrast) to confirm. 2. Gallbladder sludge. Electronically Signed   By: Genevie Ann M.D.   On: 07/10/2020 07:06     Time coordinating discharge: Over 30 minutes    Anita Dee, MD  [facility] 07/13/2020, 6:05 PM

## 2020-07-15 LAB — CULTURE, BLOOD (ROUTINE X 2)
Culture: NO GROWTH
Culture: NO GROWTH
Special Requests: ADEQUATE

## 2020-08-16 ENCOUNTER — Encounter: Payer: Self-pay | Admitting: *Deleted

## 2020-08-22 ENCOUNTER — Ambulatory Visit: Payer: Medicare Other | Admitting: Internal Medicine

## 2020-11-14 ENCOUNTER — Ambulatory Visit: Payer: Medicare Other | Admitting: Internal Medicine

## 2021-06-30 ENCOUNTER — Emergency Department (HOSPITAL_COMMUNITY): Payer: Medicare Other

## 2021-06-30 ENCOUNTER — Other Ambulatory Visit: Payer: Self-pay

## 2021-06-30 ENCOUNTER — Inpatient Hospital Stay (HOSPITAL_COMMUNITY)
Admission: EM | Admit: 2021-06-30 | Discharge: 2021-07-03 | DRG: 917 | Disposition: A | Discharge status: Home Health | Payer: Medicare Other | Attending: Internal Medicine | Admitting: Internal Medicine

## 2021-06-30 DIAGNOSIS — G928 Other toxic encephalopathy: Secondary | ICD-10-CM | POA: Diagnosis present

## 2021-06-30 DIAGNOSIS — G9340 Encephalopathy, unspecified: Secondary | ICD-10-CM | POA: Diagnosis not present

## 2021-06-30 DIAGNOSIS — K7460 Unspecified cirrhosis of liver: Secondary | ICD-10-CM | POA: Diagnosis present

## 2021-06-30 DIAGNOSIS — E876 Hypokalemia: Secondary | ICD-10-CM | POA: Diagnosis present

## 2021-06-30 DIAGNOSIS — A419 Sepsis, unspecified organism: Secondary | ICD-10-CM

## 2021-06-30 DIAGNOSIS — E860 Dehydration: Secondary | ICD-10-CM | POA: Diagnosis present

## 2021-06-30 DIAGNOSIS — F17210 Nicotine dependence, cigarettes, uncomplicated: Secondary | ICD-10-CM | POA: Diagnosis present

## 2021-06-30 DIAGNOSIS — T402X1A Poisoning by other opioids, accidental (unintentional), initial encounter: Principal | ICD-10-CM | POA: Diagnosis present

## 2021-06-30 DIAGNOSIS — Y92009 Unspecified place in unspecified non-institutional (private) residence as the place of occurrence of the external cause: Secondary | ICD-10-CM

## 2021-06-30 DIAGNOSIS — B1920 Unspecified viral hepatitis C without hepatic coma: Secondary | ICD-10-CM | POA: Diagnosis present

## 2021-06-30 DIAGNOSIS — N390 Urinary tract infection, site not specified: Secondary | ICD-10-CM | POA: Diagnosis present

## 2021-06-30 DIAGNOSIS — I10 Essential (primary) hypertension: Secondary | ICD-10-CM | POA: Diagnosis present

## 2021-06-30 DIAGNOSIS — R197 Diarrhea, unspecified: Secondary | ICD-10-CM

## 2021-06-30 DIAGNOSIS — B182 Chronic viral hepatitis C: Secondary | ICD-10-CM | POA: Diagnosis present

## 2021-06-30 DIAGNOSIS — Z79891 Long term (current) use of opiate analgesic: Secondary | ICD-10-CM

## 2021-06-30 DIAGNOSIS — M159 Polyosteoarthritis, unspecified: Secondary | ICD-10-CM | POA: Diagnosis present

## 2021-06-30 DIAGNOSIS — Z720 Tobacco use: Secondary | ICD-10-CM | POA: Diagnosis present

## 2021-06-30 DIAGNOSIS — G9341 Metabolic encephalopathy: Secondary | ICD-10-CM | POA: Diagnosis present

## 2021-06-30 DIAGNOSIS — Z20822 Contact with and (suspected) exposure to covid-19: Secondary | ICD-10-CM | POA: Diagnosis present

## 2021-06-30 DIAGNOSIS — E872 Acidosis, unspecified: Secondary | ICD-10-CM

## 2021-06-30 DIAGNOSIS — G894 Chronic pain syndrome: Secondary | ICD-10-CM | POA: Diagnosis present

## 2021-06-30 DIAGNOSIS — Z28310 Unvaccinated for covid-19: Secondary | ICD-10-CM

## 2021-06-30 DIAGNOSIS — R9431 Abnormal electrocardiogram [ECG] [EKG]: Secondary | ICD-10-CM | POA: Diagnosis present

## 2021-06-30 LAB — COMPREHENSIVE METABOLIC PANEL
ALT: 41 U/L (ref 0–44)
AST: 62 U/L — ABNORMAL HIGH (ref 15–41)
Albumin: 4.5 g/dL (ref 3.5–5.0)
Alkaline Phosphatase: 120 U/L (ref 38–126)
Anion gap: 15 (ref 5–15)
BUN: 16 mg/dL (ref 8–23)
CO2: 21 mmol/L — ABNORMAL LOW (ref 22–32)
Calcium: 9.3 mg/dL (ref 8.9–10.3)
Chloride: 102 mmol/L (ref 98–111)
Creatinine, Ser: 0.7 mg/dL (ref 0.44–1.00)
GFR, Estimated: >60 mL/min (ref >60)
Glucose, Bld: 103 mg/dL — ABNORMAL HIGH (ref 70–99)
Potassium: 3.7 mmol/L (ref 3.5–5.1)
Sodium: 138 mmol/L (ref 135–145)
Total Bilirubin: 1.2 mg/dL (ref 0.3–1.2)
Total Protein: 8.4 g/dL — ABNORMAL HIGH (ref 6.5–8.1)

## 2021-06-30 LAB — CBC WITH DIFFERENTIAL/PLATELET
Abs Immature Granulocytes: 0.07 10*3/uL (ref 0.00–0.07)
Basophils Absolute: 0 10*3/uL (ref 0.0–0.1)
Basophils Relative: 0 %
Eosinophils Absolute: 0 10*3/uL (ref 0.0–0.5)
Eosinophils Relative: 0 %
HCT: 53 % — ABNORMAL HIGH (ref 36.0–46.0)
Hemoglobin: 18.7 g/dL — ABNORMAL HIGH (ref 12.0–15.0)
Immature Granulocytes: 1 %
Lymphocytes Relative: 14 %
Lymphs Abs: 1.5 10*3/uL (ref 0.7–4.0)
MCH: 31.1 pg (ref 26.0–34.0)
MCHC: 35.3 g/dL (ref 30.0–36.0)
MCV: 88.2 fL (ref 80.0–100.0)
Monocytes Absolute: 0.6 10*3/uL (ref 0.1–1.0)
Monocytes Relative: 6 %
Neutro Abs: 8 10*3/uL — ABNORMAL HIGH (ref 1.7–7.7)
Neutrophils Relative %: 79 %
Platelets: 151 10*3/uL (ref 150–400)
RBC: 6.01 MIL/uL — ABNORMAL HIGH (ref 3.87–5.11)
RDW: 13 % (ref 11.5–15.5)
WBC: 10.2 10*3/uL (ref 4.0–10.5)
nRBC: 0 % (ref 0.0–0.2)

## 2021-06-30 LAB — AMMONIA: Ammonia: 12 umol/L (ref 9–35)

## 2021-06-30 LAB — LACTIC ACID, PLASMA
Lactic Acid, Venous: 3.6 mmol/L (ref 0.5–1.9)
Lactic Acid, Venous: 2.4 mmol/L (ref 0.5–1.9)
Lactic Acid, Venous: 1.8 mmol/L (ref 0.5–1.9)

## 2021-06-30 LAB — PROTIME-INR
INR: 1.1 (ref 0.8–1.2)
Prothrombin Time: 14.5 seconds (ref 11.4–15.2)

## 2021-06-30 LAB — TSH: TSH: 0.232 u[IU]/mL — ABNORMAL LOW (ref 0.350–4.500)

## 2021-06-30 LAB — RESP PANEL BY RT-PCR (FLU A&B, COVID) ARPGX2
Influenza A by PCR: NEGATIVE
Influenza B by PCR: NEGATIVE
SARS Coronavirus 2 by RT PCR: NEGATIVE

## 2021-06-30 LAB — ETHANOL: Alcohol, Ethyl (B): <10 mg/dL (ref <10)

## 2021-06-30 MED ORDER — SODIUM CHLORIDE 0.9 % IV SOLN: INTRAVENOUS | Status: DC

## 2021-06-30 MED ORDER — IOHEXOL 9 MG/ML PO SOLN
ORAL | Status: AC
  Filled 2021-06-30: qty 1000

## 2021-06-30 MED ORDER — MORPHINE SULFATE (PF) 2 MG/ML IV SOLN: 1.0000 mg | INTRAVENOUS | Status: DC | PRN

## 2021-06-30 MED ORDER — HYDRALAZINE HCL 20 MG/ML IJ SOLN
10.0000 mg | Freq: Four times a day (QID) | INTRAMUSCULAR | Status: DC | PRN
  Administered 2021-07-01 (×2): 10 mg via INTRAVENOUS
  Filled 2021-06-30 (×2): qty 1

## 2021-06-30 MED ORDER — SODIUM CHLORIDE 0.9 % IV BOLUS
1000.0000 mL | Freq: Once | INTRAVENOUS | Status: AC
  Administered 2021-06-30: 1000 mL via INTRAVENOUS

## 2021-06-30 MED ORDER — LORAZEPAM 2 MG/ML IJ SOLN: 1.0000 mg | Freq: Once | INTRAMUSCULAR | Status: DC

## 2021-06-30 MED ORDER — ENOXAPARIN SODIUM 40 MG/0.4ML IJ SOSY
40.0000 mg | PREFILLED_SYRINGE | INTRAMUSCULAR | Status: DC
  Administered 2021-06-30 – 2021-07-02 (×3): 40 mg via SUBCUTANEOUS
  Filled 2021-06-30 (×3): qty 0.4

## 2021-06-30 MED ORDER — MORPHINE SULFATE (PF) 2 MG/ML IV SOLN
1.0000 mg | Freq: Once | INTRAVENOUS | Status: AC
  Administered 2021-06-30: 1 mg via INTRAVENOUS
  Filled 2021-06-30: qty 1

## 2021-06-30 MED ORDER — HYDRALAZINE HCL 20 MG/ML IJ SOLN
10.0000 mg | Freq: Three times a day (TID) | INTRAMUSCULAR | Status: DC | PRN
  Administered 2021-06-30: 10 mg via INTRAVENOUS
  Filled 2021-06-30: qty 1

## 2021-06-30 NOTE — Assessment & Plan Note (Signed)
Encourage cessation. °

## 2021-06-30 NOTE — ED Provider Notes (Signed)
Pt signed out by Dr. Jeraldine Loots pending call back for admission.  Pt d/w Dr. Artis Flock ([facility]) who will admit. ?  ?Jacalyn Lefevre, MD ?06/30/21 1640 ? ?

## 2021-06-30 NOTE — Assessment & Plan Note (Addendum)
Untreated, per daughter appointment were set up but she never did go to them  ?Ammonia wnl, INR wnl and platelets wnl  ?AST 62, around baseline  ?

## 2021-06-30 NOTE — Assessment & Plan Note (Signed)
History of prolonged QT ?Optimize electrolytes, checking Mg/CK ?Avoid QT prolonging drugs ?Continue telemetry ?

## 2021-06-30 NOTE — Assessment & Plan Note (Addendum)
Likely secondary to dehydration as no signs of infection at this point ?Given IVF, continue maintenance, trend lactic acid ?

## 2021-06-30 NOTE — ED Provider Notes (Signed)
?[place] ?Provider Note ? ? ?CSN: [id number] ?Arrival date & time: 06/30/21  1126 ? ?  ? ?History ? ?Chief Complaint  ?Patient presents with  ? Altered Mental Status  ? ? ?Anita Price is a 66 y.o. female. ? ?HPI ?Patient arrives via EMS due to altered mental status.  History is obtained by those individuals as the patient is minimally verbal, does respond to her name, but otherwise is nonparticipatory. ?EMS reports the patient was at home, family members found her to be altered for about 3 days.  Patient chart review, has multiple medical issues including chronic pain, liver dysfunction. ?EMS reports mild hypertension, otherwise unremarkable vital signs in transport.  Patient cannot complain of anything, or describe anything.  Level 5 caveat secondary to acuity of condition ?  ? ?Home Medications ?Prior to Admission medications   ?Medication Sig Start Date End Date Taking? Authorizing Provider  ?albuterol (VENTOLIN HFA) 108 (90 Base) MCG/ACT inhaler Inhale into the lungs every 6 (six) hours as needed for wheezing or shortness of breath.    [provider]  ?lactulose (CHRONULAC) 10 GM/15ML solution Take 15 mLs (10 g total) by mouth 2 (two) times daily. Goal 2-3 soft formed bowel movements daily 07/13/20   Dwyane Dee, MD  ?losartan (COZAAR) 50 MG tablet Take 1 tablet (50 mg total) by mouth daily. 07/14/20   Dwyane Dee, MD  ?morphine (MS CONTIN) 30 MG 12 hr tablet Take 30 mg by mouth every 8 (eight) hours. 02/09/20   [provider]  ?Multiple Vitamin (MULTIVITAMIN WITH MINERALS) TABS tablet Take 1 tablet by mouth daily. 07/14/20   Dwyane Dee, MD  ?pregabalin (LYRICA) 300 MG capsule Take 300 mg by mouth in the morning and at bedtime. 01/03/20   [provider]  ?vitamin B-12 1000 MCG tablet Take 1 tablet (1,000 mcg total) by mouth daily. 07/14/20   Dwyane Dee, MD  ?   ? ?Allergies    ?Effersyllium [psyllium], Neurontin [gabapentin], and  Tramadol   ? ?Review of Systems   ?Review of Systems  ?Unable to perform ROS: Acuity of condition  ? ?Physical Exam ?Updated Vital Signs ?Ht $RemoveB'5\' 5"'INmLZNpX$  (1.651 m)   Wt 49.9 kg   SpO2 98%   BMI 18.30 kg/m?  ?Physical Exam ?Vitals and nursing note reviewed.  ?Constitutional:   ?   Appearance: She is well-developed. She is ill-appearing.  ?   Comments: Sickly appearing jaundiced female with obvious frailty answering quietly when her name is mentioned otherwise essentially nonparticipatory  ?HENT:  ?   Head: Normocephalic and atraumatic.  ?Eyes:  ?   Conjunctiva/sclera: Conjunctivae normal.  ?Cardiovascular:  ?   Rate and Rhythm: Normal rate and regular rhythm.  ?Pulmonary:  ?   Effort: Pulmonary effort is normal. No respiratory distress.  ?   Breath sounds: Normal breath sounds. No stridor.  ?Abdominal:  ?   General: There is no distension.  ?   Tenderness: There is no guarding.  ?Musculoskeletal:  ?   Comments: No deformities, does move all extremity spontaneously, minimally.  ?Skin: ?   General: Skin is warm and dry.  ?Neurological:  ?   Mental Status: She is oriented to person, place, and time.  ?   Cranial Nerves: No cranial nerve deficit.  ?   Comments: Withdrawn, offers only inconsistent minimal speech, which is clear.  Unable to ascertain orientation beyond to self.  ?Psychiatric:     ?   Mood and Affect: Mood  normal.     ?   Cognition and Memory: Cognition is impaired.  ? ? ?ED Results / Procedures / Treatments   ?Labs ?(all labs ordered are listed, but only abnormal results are displayed) ?Labs Reviewed  ?COMPREHENSIVE METABOLIC PANEL  ?RAPID URINE DRUG SCREEN, HOSP PERFORMED  ?ETHANOL  ?CBC WITH DIFFERENTIAL/PLATELET  ?PROTIME-INR  ?URINALYSIS, ROUTINE W REFLEX MICROSCOPIC  ?AMMONIA  ?LACTIC ACID, PLASMA  ?CBG MONITORING, ED  ? ? ?EKG ?None ? ?Radiology ?No results found. ? ?Procedures ?Procedures  ? ? ?Medications Ordered in ED ?Medications  ?sodium chloride 0.9 % bolus 1,000 mL (has no administration in time  range)  ?  And  ?0.9 %  sodium chloride infusion (has no administration in time range)  ? ? ?ED Course/ Medical Decision Making/ A&P ?This patient with a Hx of episode of metabolic encephalopathy last year, cirrhosis, hep C, chronic pain requiring narcotics presents to the ED for concern of altered mental status, this involves an extensive number of treatment options, and is a complaint that carries with it a high risk of complications and morbidity.   ? ?The differential diagnosis includes encephalopathy, overdose, progression of liver dysfunction, intracranial injury, stroke ? ? ?Social Determinants of Health: ? ?Opiate dependency, cigarette dependency ? ?Additional history obtained: ? ?Additional history and/or information obtained from EMS, and chart, notable for EMS details as above, chart review notable for admission April of last year with similar circumstances, patient remains acutely encephalopathic, multifactorial at that point she had pneumonia. ?Per chart review description of context as below: ? ? ?Hospital Course: ?Anita Price is a 66 y.o. female with medical history significant of DJD of multiple sites, chronic back pain, history of back surgery, chronic hepatitis C, tobacco abuse, essential hypertension who was brought to the emergency department due to altered mental status.  The patient's daughter stated that she has had gastroenteritis about 2 weeks ago and has not been the same since then.  Her appetite has been decreased.  She has been confused and at times her speech has been slurred.  ?Patient was febrile on arrival at 100.9, tachycardic and tachypneic, chest x-ray concerning for multifocal pneumonia although no respiratory symptoms, elevated lactic acid at 4>1.8, ?t bili 2.7,INR 1.3, ammonia levels of 55, RUQ [place] with liver cirrhosis.  Calcitonin at 2.44 ?Initially met sepsis criteria most likely secondary to pneumonia. ?CT chest and abdomen concerning for multifocal bilateral pneumonia,  respiratory viral panel positive for coronavirus NL63, liver cirrhosis with signs of portal hypertension and esophageal varices. ?  ?Daughter at bedside initially also stated that patient not at her baseline with progressive worsening in her mental status for the past few weeks, progressive decline over the past couple of years with some unintentional weight loss.  Patient was never a heavy smoker and has a history of some alcohol use.  ?She did endorse ongoing tobacco use.  She has not followed with primary care in several years. ?  ?Daughter seems supportive but was very overwhelmed with all those findings as she was not aware of any liver cirrhosis.  Patient has an history of chronic hep C which was never treated.  No Covid vaccine or other preventive healthcare.  No EGD or colonoscopy, no mammography. ?See below for further problem-based plans. ? ? ?After the initial evaluation, orders, including: Head CT, x-ray, labs, fluid resuscitation were initiated. ? ? ?Patient placed on Cardiac and Pulse-Oximetry Monitors. ?The patient was maintained on a cardiac monitor.  The cardiac monitored showed an  rhythm of 80 sinus normal ?The patient was also maintained on pulse oximetry. The readings were typically 100% room air normal ? ? ?On repeat evaluation of the patient stayed the same ? ?Lab Tests: ? ?I personally interpreted labs.  The pertinent results include: Lactic acidosis ? ?Imaging Studies ordered: ? ?I independently visualized and interpreted imaging which showed Head CT without alarming findings, x-ray without pneumoni ?I agree with the radiologist interpretation ? ?Consultations Obtained: ? ?I requested consultation with the internal medicine,  and discussed lab and imaging findings as well as pertinent plan - they recommend: Admission ? ?Dispostion / Final MDM: ? ?After consideration of the diagnostic results and the patient's response to treatment, she will be admitted for further monitoring, management of  encephalopathy. ?Patient's physical exam does not show evidence for acute trauma, has no obvious deformities, and with prompting does move her extremities spontaneously.  Some suspicion for the patient's histo

## 2021-06-30 NOTE — ED Notes (Signed)
Received pt alert, eyes open, screaming with every movement and says "Oh God!". Asked pt why she's screaming, pt did not answer any of this RN questions. Pt was covered with dry stool from back to toes and fingers. Cleaned and repositioned by this RN and [place] tech. When explaining to pt that we're cleaning her, pt mumbled "mmm hmm" as a response.  ?

## 2021-06-30 NOTE — Assessment & Plan Note (Addendum)
Quite hypertensive, start IV Prn hydralazine  ?Was given losartan at last hospitalization a year ago, but never followed up with PCP ?bp readings at pain clinic have been around [id number] systolic. ?Start back losartan when able to safely tolerate PO  ?

## 2021-06-30 NOTE — Assessment & Plan Note (Addendum)
Query if causing altered mental status from dehydration vs. Withdrawal?  ?She is tender and yelling on abdominal exam-since I can't get history will check abdominal CT (keep npo and when more awake will see if she can tolerate contrast. Is much more comfortable after morphine/hydralazine. Asked nurse to call if pain worse and can order without contrast) ?Checking c.dfiff and stool studies ?Continue contact precautions ?Continue IVF  ?

## 2021-06-30 NOTE — Assessment & Plan Note (Deleted)
Untreated, per daughter appointment were set up but she never did go to them  ?

## 2021-06-30 NOTE — ED Triage Notes (Signed)
Pt BIB EMS from home, family members/ nephew reported AMS x 3 days, family not good historian per EMS. Pt yelling d/t pain, unable to state where pain is. H/O Liver failure on transplant list, Hep C. Takes Morphine only. ? ?180/100 ?HR 80s with PVCs ?SPO2 98% RA ?CBG 125 ?

## 2021-06-30 NOTE — ED Notes (Signed)
Pt had another BM. Diaper and linen changed with [place] tech. ?

## 2021-06-30 NOTE — ED Notes (Signed)
In-out cath done with tech assist. Only collected few drops of urine-- sent to lab. ?

## 2021-06-30 NOTE — Assessment & Plan Note (Addendum)
-  66 year old female  presenting with acute encephalopathy as evidenced by her confusion and decreased responsiveness. Last known well 3 days ago  ?-observation to medical telemetry  ?-at baseline she is fully independent ?-Evaluation thus far unremarkable including head CT, CXR, ammonia, ethanol. Urine studies pending  ?-has had episodes of diarrhea: checking c.diff/stool PCR. She is tender on exam will check abdominal CT. No criteria for sepsis. ? If some withdrawal component from her chronic opiates? She has yawning, tremors, pain, diarrhea. Unsure when she took her last dose of pain medication  ?-She does appear to be mildly dehydrated based off exam/recurrent diarrhea/hemoconcentration and elevated lactic acid. Urine studies pending  ?-Based on unremarkable evaluation with current ability to protect her airway, will observe for now with IVF hydration and telemetry monitoring ?-NPO Until more alert  ? ?

## 2021-06-30 NOTE — Assessment & Plan Note (Signed)
Currently on MS Contin 30mg  q 8 hours ?lyrica 300mg  BID ?Flexeril 10mg  TID prn ?I do wonder if she is having some withdrawal symptoms from her chronic pain meds as Im unsure when she took her last prescribed dose based off physical exam ?Will decrease her TDD by 50% and convert to IV morphine at 1-2mg  q4 hours ?Will continue PO meds when more alert.  ?Follows regularly with pain management and pmp website reviewed.  ?

## 2021-06-30 NOTE — H&P (Signed)
?History and Physical  ? ? ?Patient: Anita Price GGY:[id number] DOB: 08/29/1955 ?DOA: 06/30/2021 ?DOS: the patient was seen and examined on 06/30/2021 ?PCP: Jackie Plum, MD  ?Patient coming from: Home - lives with her 2 grandkids  ? ? ?Chief Complaint: AMS ? ?HPI: BRAILYNN BRETH is a 66 y.o. female with medical history significant of  DJD of multiple sites, chronic back pain, history of back surgery, chronic hepatitis C, tobacco abuse, HTN who presented to ED with AMS.  ? ?History form daughter. The grandkids called her daughter and told her what was going on.  She went over to the house and found her in bed and had pooped all over. She was confused and had decreased responsiveness so EMS was called. She almost didn't recognize her daughter.  ? ?She last talked to her 3 days ago and was her normal self. She had no complaints.  ? ?She is in bed and covered with stool. Will answer yes and no questions at times, can not tell me where she is and moans with movements.  ? ? ?She does smoke, does not drink. At baseline she is fully independent.  ? ? ?ER Course:  vitals: temp: 97.4, bp: 199/107, HR: 90, RR: 18, oxygen: 100%RA ?Pertinent labs: lactic acid: 3.6, hgb: 18.7,  ?CTH: no acute finding. Numerous dental caries and periapical abscess of upper teeth seen on previous CT ?CXR: no active disease  ?In ED: given 2L IVF bolus and maintenance fluids as well as 1mg  ativan.  ? ? ? ?Review of Systems: unable to review all systems due to the inability of the patient to answer questions. ?Past Medical History:  ?Diagnosis Date  ? Acute metabolic encephalopathy   ? Chronic back pain   ? DJD (degenerative joint disease)   ? Essential hypertension 07/10/2020  ? Gallbladder sludge   ? Hepatitis C 07/10/2020  ? Liver cirrhosis (HCC)   ? Pain management   ? Prolonged QT interval   ? Protein calorie malnutrition (HCC)   ? Thyroid nodule   ? Tobacco use 07/10/2020  ? ?Past Surgical History:  ?Procedure Laterality Date  ? BACK SURGERY     ? btl    ? HERNIA REPAIR    ? WRIST SURGERY    ? ?Social History:  reports that she has been smoking. She has been smoking an average of .5 packs per day. She has never used smokeless tobacco. She reports that she does not currently use drugs. She reports that she does not drink alcohol. ? ?Allergies  ?Allergen Reactions  ? Effersyllium [Psyllium]   ?  Makes pt feel bad   ? Neurontin [Gabapentin]   ?  Makes pt feel bad  ? Tramadol   ?  Makes pt feel bad  ? ? ?Family History  ?Problem Relation Age of Onset  ? Scoliosis Mother   ? ? ?Prior to Admission medications   ?Medication Sig Start Date End Date Taking? Authorizing Provider  ?acetaminophen (TYLENOL) 500 MG tablet Take 500 mg by mouth every 6 (six) hours as needed for headache.   Yes [provider]  ?albuterol (VENTOLIN HFA) 108 (90 Base) MCG/ACT inhaler Inhale into the lungs every 6 (six) hours as needed for wheezing or shortness of breath.   Yes [provider]  ?cyclobenzaprine (FLEXERIL) 10 MG tablet Take 10 mg by mouth 3 (three) times daily as needed for muscle spasms. 06/08/21  Yes [provider]  ?morphine (MS CONTIN) 30 MG 12 hr tablet Take  30 mg by mouth every 8 (eight) hours. 02/09/20  Yes [provider]  ?pregabalin (LYRICA) 300 MG capsule Take 300 mg by mouth in the morning and at bedtime. 01/03/20  Yes [provider]  ?losartan (COZAAR) 50 MG tablet Take 1 tablet (50 mg total) by mouth daily. ?Patient not taking: Reported on 06/30/2021 07/14/20   Lewie ChamberGirguis, David, MD  ? ? ?Physical Exam: ?Vitals:  ? 06/30/21 1600 06/30/21 1717 06/30/21 1730 06/30/21 1800  ?BP: (!) 208/99 (!) 211/113 (!) 190/96 (!) 174/95  ?Pulse: 78  81 87  ?Resp: (!) 23  (!) 26 20  ?Temp:      ?TempSrc:      ?SpO2: 99%  100% 100%  ?Weight:      ?Height:      ? ?General:  Appears confused, moans with moving.  ?Eyes:  PERRL, EOMI, normal lids, iris ?ENT:  grossly normal hearing, lips & tongue, dry mucous membranes; poor dentition with broken  teeth  ?Neck:  no LAD, masses or thyromegaly; no carotid bruits ?Cardiovascular:  RRR, no m/r/g. No LE edema.  ?Respiratory:   CTA bilaterally with no wheezes/rales/rhonchi.  Normal respiratory effort. ?Abdomen:  soft, moans when pressing ND, NABS ?Back:   normal alignment, no CVAT ?Skin:  no rash or induration seen on limited exam ?Musculoskeletal:  grossly normal tone BUE/BLE, good ROM, no bony abnormality ?Lower extremity:  No LE edema.  Limited foot exam with no ulcerations.  2+ distal pulses. ?Psychiatric:  can not assess. Confused and moaning, occasional yes or no. Not oriented.  ?Neurologic:  can not assess.  moves all extremities in coordinated fashion, sensation intact ? ? ?Radiological Exams on Admission: ?Independently reviewed - see discussion in A/P where applicable ? ?CT HEAD WO CONTRAST ? ?Result Date: 06/30/2021 ?CLINICAL DATA:  66 year old female with altered mental status. EXAM: CT HEAD WITHOUT CONTRAST TECHNIQUE: Contiguous axial images were obtained from the base of the skull through the vertex without intravenous contrast. RADIATION DOSE REDUCTION: This exam was performed according to the departmental dose-optimization program which includes automated exposure control, adjustment of the mA and/or kV according to patient size and/or use of iterative reconstruction technique. COMPARISON:  07/12/2020 CT and prior studies FINDINGS: Brain: No evidence of acute infarction, hemorrhage, hydrocephalus, extra-axial collection or mass lesion/mass effect. Mild atrophy again noted. Vascular: No hyperdense vessel or unexpected calcification. Skull: Normal. Negative for fracture or focal lesion. Sinuses/Orbits: No acute finding. Other: Numerous dental caries and probable periapical abscesses of the UPPER teeth again noted. IMPRESSION: 1. No evidence of acute intracranial abnormality. 2. Numerous dental caries and periapical abscesses of the UPPER teeth again noted. Electronically Signed   By: Harmon PierJeffrey  Hu M.D.    On: 06/30/2021 13:40  ? ?DG Chest Port 1 View ? ?Result Date: 06/30/2021 ?CLINICAL DATA:  Altered mental status EXAM: PORTABLE CHEST 1 VIEW COMPARISON:  CT examination dated July 10, 2020 FINDINGS: The heart size and mediastinal contours are within normal limits. Mild emphysematous changes of lung apices. Lungs otherwise clear without evidence of focal consolidation. Pleural effusion or pneumothorax. Partially imaged anterior cervical discectomy and fusion hardware. No acute osseous abnormality. IMPRESSION: No active disease. Electronically Signed   By: Larose HiresImran  Ahmed D.O.   On: 06/30/2021 12:53   ? ?EKG: Independently reviewed.  NSR with rate 72; nonspecific ST changes with no evidence of acute ischemia. T wave inversions anteriolateral leads, on previous ekg. Repeat ekg with prolonged qt.  ? ? ?Labs on Admission: I have personally reviewed the  available labs and imaging studies at the time of the admission. ? ?Pertinent labs:   ?lactic acid: 3.6, ? hgb: 18.7,  ? ? ?Assessment and Plan: ?* Encephalopathy acute ?-66 year old female  presenting with acute encephalopathy as evidenced by her confusion and decreased responsiveness. Last known well 3 days ago  ?-observation to medical telemetry  ?-at baseline she is fully independent ?-Evaluation thus far unremarkable including head CT, CXR, ammonia, ethanol. Urine studies pending  ?-has had episodes of diarrhea: checking c.diff/stool PCR. She is tender on exam will check abdominal CT. No criteria for sepsis. ? If some withdrawal component from her chronic opiates? She has yawning, tremors, pain, diarrhea. Unsure when she took her last dose of pain medication  ?-She does appear to be mildly dehydrated based off exam/recurrent diarrhea/hemoconcentration and elevated lactic acid. Urine studies pending  ?-Based on unremarkable evaluation with current ability to protect her airway, will observe for now with IVF hydration and telemetry monitoring ?-NPO Until more alert   ? ? ?Diarrhea ?Query if causing altered mental status from dehydration vs. Withdrawal?  ?She is tender and yelling on abdominal exam-since I can't get history will check abdominal CT (keep npo and when more a

## 2021-06-30 NOTE — Assessment & Plan Note (Signed)
>>  ASSESSMENT AND PLAN FOR LACTIC ACIDOSIS WRITTEN ON 06/30/2021  5:12 PM BY Orland Mustard, MD  Likely secondary to dehydration as no signs of infection at this point Given IVF, continue maintenance, trend lactic acid

## 2021-06-30 NOTE — ED Notes (Addendum)
Per Dr. Artis Flock, CT has been scheduled when she wakes up more/ when pt can drink PO contrast. Tech attempted to recheck temp, pt refusing at this time. ?

## 2021-07-01 ENCOUNTER — Observation Stay (HOSPITAL_COMMUNITY): Payer: Medicare Other

## 2021-07-01 ENCOUNTER — Inpatient Hospital Stay (HOSPITAL_COMMUNITY): Payer: Medicare Other

## 2021-07-01 DIAGNOSIS — B1920 Unspecified viral hepatitis C without hepatic coma: Secondary | ICD-10-CM | POA: Diagnosis present

## 2021-07-01 DIAGNOSIS — R4182 Altered mental status, unspecified: Secondary | ICD-10-CM | POA: Diagnosis not present

## 2021-07-01 DIAGNOSIS — K7460 Unspecified cirrhosis of liver: Secondary | ICD-10-CM | POA: Diagnosis present

## 2021-07-01 DIAGNOSIS — R197 Diarrhea, unspecified: Secondary | ICD-10-CM | POA: Diagnosis present

## 2021-07-01 DIAGNOSIS — G9340 Encephalopathy, unspecified: Secondary | ICD-10-CM | POA: Diagnosis present

## 2021-07-01 DIAGNOSIS — E860 Dehydration: Secondary | ICD-10-CM | POA: Diagnosis present

## 2021-07-01 DIAGNOSIS — F17210 Nicotine dependence, cigarettes, uncomplicated: Secondary | ICD-10-CM | POA: Diagnosis present

## 2021-07-01 DIAGNOSIS — N390 Urinary tract infection, site not specified: Secondary | ICD-10-CM | POA: Diagnosis present

## 2021-07-01 DIAGNOSIS — E872 Acidosis, unspecified: Secondary | ICD-10-CM | POA: Diagnosis present

## 2021-07-01 DIAGNOSIS — G9341 Metabolic encephalopathy: Secondary | ICD-10-CM | POA: Diagnosis present

## 2021-07-01 DIAGNOSIS — I10 Essential (primary) hypertension: Secondary | ICD-10-CM | POA: Diagnosis present

## 2021-07-01 DIAGNOSIS — Z28310 Unvaccinated for covid-19: Secondary | ICD-10-CM | POA: Diagnosis not present

## 2021-07-01 DIAGNOSIS — E876 Hypokalemia: Secondary | ICD-10-CM | POA: Diagnosis present

## 2021-07-01 DIAGNOSIS — T402X1A Poisoning by other opioids, accidental (unintentional), initial encounter: Secondary | ICD-10-CM | POA: Diagnosis present

## 2021-07-01 DIAGNOSIS — Z20822 Contact with and (suspected) exposure to covid-19: Secondary | ICD-10-CM | POA: Diagnosis present

## 2021-07-01 DIAGNOSIS — Y92009 Unspecified place in unspecified non-institutional (private) residence as the place of occurrence of the external cause: Secondary | ICD-10-CM | POA: Diagnosis not present

## 2021-07-01 DIAGNOSIS — G894 Chronic pain syndrome: Secondary | ICD-10-CM | POA: Diagnosis present

## 2021-07-01 DIAGNOSIS — Z79891 Long term (current) use of opiate analgesic: Secondary | ICD-10-CM | POA: Diagnosis not present

## 2021-07-01 DIAGNOSIS — G928 Other toxic encephalopathy: Secondary | ICD-10-CM | POA: Diagnosis present

## 2021-07-01 DIAGNOSIS — B182 Chronic viral hepatitis C: Secondary | ICD-10-CM | POA: Diagnosis present

## 2021-07-01 DIAGNOSIS — R9431 Abnormal electrocardiogram [ECG] [EKG]: Secondary | ICD-10-CM | POA: Diagnosis present

## 2021-07-01 DIAGNOSIS — M159 Polyosteoarthritis, unspecified: Secondary | ICD-10-CM | POA: Diagnosis present

## 2021-07-01 LAB — URINALYSIS, ROUTINE W REFLEX MICROSCOPIC
Bilirubin Urine: NEGATIVE
Glucose, UA: NEGATIVE mg/dL
Ketones, ur: 20 mg/dL — AB
Nitrite: POSITIVE — AB
Protein, ur: NEGATIVE mg/dL
Specific Gravity, Urine: 1.034 — ABNORMAL HIGH (ref 1.005–1.030)
WBC, UA: >50 WBC/hpf — ABNORMAL HIGH (ref 0–5)
pH: 6 (ref 5.0–8.0)

## 2021-07-01 LAB — GASTROINTESTINAL PANEL BY PCR, STOOL (REPLACES STOOL CULTURE)

## 2021-07-01 LAB — MAGNESIUM: Magnesium: 2.2 mg/dL (ref 1.7–2.4)

## 2021-07-01 LAB — URINE CULTURE: Culture: NO GROWTH

## 2021-07-01 LAB — BASIC METABOLIC PANEL
Anion gap: 14 (ref 5–15)
BUN: 13 mg/dL (ref 8–23)
CO2: 19 mmol/L — ABNORMAL LOW (ref 22–32)
Calcium: 8.9 mg/dL (ref 8.9–10.3)
Chloride: 105 mmol/L (ref 98–111)
Creatinine, Ser: 0.72 mg/dL (ref 0.44–1.00)
GFR, Estimated: >60 mL/min (ref >60)
Glucose, Bld: 102 mg/dL — ABNORMAL HIGH (ref 70–99)
Potassium: 5.1 mmol/L (ref 3.5–5.1)
Sodium: 138 mmol/L (ref 135–145)

## 2021-07-01 LAB — CBC
HCT: 50 % — ABNORMAL HIGH (ref 36.0–46.0)
Hemoglobin: 17.2 g/dL — ABNORMAL HIGH (ref 12.0–15.0)
MCH: 30.4 pg (ref 26.0–34.0)
MCHC: 34.4 g/dL (ref 30.0–36.0)
MCV: 88.3 fL (ref 80.0–100.0)
Platelets: 192 10*3/uL (ref 150–400)
RBC: 5.66 MIL/uL — ABNORMAL HIGH (ref 3.87–5.11)
RDW: 13.2 % (ref 11.5–15.5)
WBC: 13.9 10*3/uL — ABNORMAL HIGH (ref 4.0–10.5)
nRBC: 0 % (ref 0.0–0.2)

## 2021-07-01 LAB — BRAIN NATRIURETIC PEPTIDE: B Natriuretic Peptide: 186.1 pg/mL — ABNORMAL HIGH (ref 0.0–100.0)

## 2021-07-01 LAB — PROCALCITONIN: Procalcitonin: <0.1 ng/mL

## 2021-07-01 LAB — CK: Total CK: 131 U/L (ref 38–234)

## 2021-07-01 LAB — MRSA NEXT GEN BY PCR, NASAL: MRSA by PCR Next Gen: DETECTED — AB

## 2021-07-01 LAB — T4, FREE: Free T4: 1.09 ng/dL (ref 0.61–1.12)

## 2021-07-01 MED ORDER — CYCLOBENZAPRINE HCL 5 MG PO TABS
7.5000 mg | ORAL_TABLET | Freq: Every day | ORAL | Status: DC
  Administered 2021-07-01: 7.5 mg via ORAL
  Filled 2021-07-01: qty 2

## 2021-07-01 MED ORDER — MORPHINE SULFATE ER 15 MG PO TBCR
15.0000 mg | EXTENDED_RELEASE_TABLET | Freq: Two times a day (BID) | ORAL | Status: DC
  Administered 2021-07-01 – 2021-07-03 (×4): 15 mg via ORAL
  Filled 2021-07-01 (×5): qty 1

## 2021-07-01 MED ORDER — CHLORHEXIDINE GLUCONATE 0.12 % MT SOLN
15.0000 mL | Freq: Two times a day (BID) | OROMUCOSAL | Status: DC
  Administered 2021-07-01 – 2021-07-03 (×4): 15 mL via OROMUCOSAL
  Filled 2021-07-01 (×5): qty 15

## 2021-07-01 MED ORDER — MORPHINE SULFATE (PF) 2 MG/ML IV SOLN
1.0000 mg | INTRAVENOUS | Status: DC | PRN
Start: 2021-07-01 — End: 2021-07-02
  Administered 2021-07-01 – 2021-07-02 (×4): 1 mg via INTRAVENOUS
  Filled 2021-07-01 (×4): qty 1

## 2021-07-01 MED ORDER — ORAL CARE MOUTH RINSE
15.0000 mL | Freq: Two times a day (BID) | OROMUCOSAL | Status: DC
  Administered 2021-07-01 – 2021-07-02 (×3): 15 mL via OROMUCOSAL

## 2021-07-01 MED ORDER — SODIUM CHLORIDE 0.9 % IV SOLN
1.0000 g | INTRAVENOUS | Status: DC
  Administered 2021-07-01 – 2021-07-02 (×2): 1 g via INTRAVENOUS
  Filled 2021-07-01 (×2): qty 10

## 2021-07-01 MED ORDER — CARVEDILOL 3.125 MG PO TABS
3.1250 mg | ORAL_TABLET | Freq: Two times a day (BID) | ORAL | Status: DC
  Administered 2021-07-01 – 2021-07-03 (×4): 3.125 mg via ORAL
  Filled 2021-07-01 (×5): qty 1

## 2021-07-01 MED ORDER — CHLORHEXIDINE GLUCONATE CLOTH 2 % EX PADS
6.0000 | MEDICATED_PAD | Freq: Every day | CUTANEOUS | Status: DC
  Administered 2021-07-02 – 2021-07-03 (×2): 6 via TOPICAL

## 2021-07-01 MED ORDER — IOHEXOL 300 MG/ML  SOLN
100.0000 mL | Freq: Once | INTRAMUSCULAR | Status: AC | PRN
  Administered 2021-07-01: 100 mL via INTRAVENOUS

## 2021-07-01 MED ORDER — MUPIROCIN 2 % EX OINT
1.0000 "application " | TOPICAL_OINTMENT | Freq: Two times a day (BID) | CUTANEOUS | Status: DC
  Administered 2021-07-01 – 2021-07-03 (×4): 1 via NASAL
  Filled 2021-07-01: qty 22

## 2021-07-01 MED ORDER — SODIUM CHLORIDE 0.9 % IV SOLN: INTRAVENOUS | Status: DC

## 2021-07-01 NOTE — Progress Notes (Signed)
PT Cancellation Note ? ?Patient Details ?Name: Anita Price ?MRN: [id number] ?DOB: Apr 19, 1955 ? ? ?Cancelled Treatment:    Reason Eval/Treat Not Completed: Patient not medically ready ? ?Per discussion with RN, pt is lethargic, not following commands and yells out whenever she is touched. Will defer evaluation to 4/3.  ? ? ?Anita Price, PT ?Acute Rehabilitation Services  ?Pager [phone] ?Office [phone] ? ?[name] ?07/01/2021, 12:15 PM ?

## 2021-07-01 NOTE — ED Notes (Signed)
SLP at bedside.

## 2021-07-01 NOTE — Progress Notes (Signed)
[name]  ?Code Status: FULL ?Anita Price is a 65 y.o. female patient admitted from ED awake, lethargic and oriented to self - no acute distress noted. VSS -  no c/o shortness of breath, no c/o chest pain. Cardiac tele in place. ?Skin, clean-dry- intact. No evidence of skin break down noted on exam.  ??  ?Will cont to eval and treat per MD orders.  ?Jon Gills, RN  ?07/01/2021  ?

## 2021-07-01 NOTE — Procedures (Signed)
History: 66 year old female with a history of cirrhosis admitted with encephalopathy ? ?Sedation: None ? ?Technique: This EEG was acquired with electrodes placed according to the International 10-20 electrode system (including Fp1, Fp2, F3, F4, C3, C4, P3, P4, O1, O2, T3, T4, T5, T6, A1, A2, Fz, Cz, Pz). The following electrodes were missing or displaced: none. ? ? ?Background: The background consists of relatively high voltage irregular delta and theta range activities.  There are frequent 1 to 1.5 Hz frontally predominant discharges with triphasic morphology.  These improved but do not abate completely with drowsiness.  There does not seem to be any clear evolution.  At times it does appear to be a poorly sustained, poorly formed posterior dominant rhythm of 9 to 10 Hz. ? ?Photic stimulation: Physiologic driving is not performed ? ?EEG Abnormalities: 1) triphasic waves ?2) generalized irregular slow activity ? ?Clinical Interpretation: This EEG is most consistent with a moderate nonspecific generalized cerebral dysfunction (encephalopathy). There was no seizure or seizure predisposition recorded on this study. ? ?Please note that lack of epileptiform activity on EEG does not preclude the possibility of epilepsy.  ? ?Ritta Slot, MD ?[facility] ?[phone] ? ?If 7pm- 7am, please page neurology on call as listed in AMION. ? ?

## 2021-07-01 NOTE — Progress Notes (Signed)
EEG complete - results pending 

## 2021-07-01 NOTE — Evaluation (Signed)
Clinical/Bedside Swallow Evaluation ?Patient Details  ?Name: Anita Price ?MRN: [id number] ?Date of Birth: 05/21/1955 ? ?Today's Date: 07/01/2021 ?Time: SLP Start Time (ACUTE ONLY): [id number] SLP Stop Time (ACUTE ONLY): 0930 ?SLP Time Calculation (min) (ACUTE ONLY): 12 min ? ?Past Medical History:  ?Past Medical History:  ?Diagnosis Date  ? Acute metabolic encephalopathy   ? Chronic back pain   ? DJD (degenerative joint disease)   ? Essential hypertension 07/10/2020  ? Gallbladder sludge   ? Hepatitis C 07/10/2020  ? Liver cirrhosis (HCC)   ? Pain management   ? Prolonged QT interval   ? Protein calorie malnutrition (HCC)   ? Thyroid nodule   ? Tobacco use 07/10/2020  ? ?Past Surgical History:  ?Past Surgical History:  ?Procedure Laterality Date  ? BACK SURGERY    ? btl    ? HERNIA REPAIR    ? WRIST SURGERY    ? ?HPI:  ?66 y.o. female with medical history significant of  DJD of multiple sites, chronic back pain, history of back surgery, chronic hepatitis C, tobacco abuse, HTN who presented to ED with AMS and decreased responsiveness Per chart question of drug overdose versus withdrawal.   Head CT was unremarkable and so was her CT scan abdomen pelvis.  ?  ?Assessment / Plan / Recommendation  ?Clinical Impression ? Pt is lethargic although able to stimulate pt adequately for po's and mostly moaning with head gestures to questions. She is missing upper dentition and was able to minimally follow commands for oral-motor exam. Consumption of thin and purees was unremarkable. Regular texture pt had decreased awareness and gagged eventually able to [facility] with additional time. Recommend pt increase alertness and awareness before attempting to consume Dys 3 texture, thin liquids, pills whole in puree with assist. SLP will follow for ability to upgrade. Prognosis appears good. ?SLP Visit Diagnosis: Dysphagia, unspecified (R13.10) ?   ?Aspiration Risk ? Mild aspiration risk  ?  ?Diet Recommendation Dysphagia 3 (Mech soft);Thin liquid   ? ?Liquid Administration via: Straw ?Medication Administration: Whole meds with puree ?Supervision: Full supervision/cueing for compensatory strategies;Staff to assist with self feeding ?Compensations: Slow rate;Small sips/bites ?Postural Changes: Seated upright at 90 degrees  ?  ?Other  Recommendations Oral Care Recommendations: Oral care BID   ? ?Recommendations for follow up therapy are one component of a multi-disciplinary discharge planning process, led by the attending physician.  Recommendations may be updated based on patient status, additional functional criteria and insurance authorization. ? ?Follow up Recommendations No SLP follow up  ? ? ?  ?Assistance Recommended at Discharge Frequent or constant Supervision/Assistance  ?Functional Status Assessment Patient has had a recent decline in their functional status and demonstrates the ability to make significant improvements in function in a reasonable and predictable amount of time.  ?Frequency and Duration min 1 x/week  ?2 weeks ?  ?   ? ?Prognosis Prognosis for Safe Diet Advancement: Good ?Barriers to Reach Goals: Cognitive deficits  ? ?  ? ?Swallow Study   ?General Date of Onset: 06/30/21 ?HPI: 66 y.o. female with medical history significant of  DJD of multiple sites, chronic back pain, history of back surgery, chronic hepatitis C, tobacco abuse, HTN who presented to ED with AMS and decreased responsiveness Per chart question of drug overdose versus withdrawal.   Head CT was unremarkable and so was her CT scan abdomen pelvis. ?Type of Study: Bedside Swallow Evaluation ?Previous Swallow Assessment:  (no) ?Diet Prior to this Study: NPO ?Temperature Spikes Noted: No ?  Respiratory Status: Room air ?History of Recent Intubation: No ?Behavior/Cognition: Lethargic/Drowsy;Requires cueing ?Oral Cavity Assessment: Dry ?Oral Care Completed by SLP: No ?Oral Cavity - Dentition: Missing dentition;Poor condition (missing upper dentition) ?Vision:   Marcos Eke) ?Self-Feeding Abilities: Total assist ?Patient Positioning: Upright in bed ?Baseline Vocal Quality: Low vocal intensity ?Volitional Cough: Cognitively unable to elicit ?Volitional Swallow: Unable to elicit  ?  ?Oral/Motor/Sensory Function Overall Oral Motor/Sensory Function:  (appears functional)   ?Ice Chips Ice chips: Not tested   ?Thin Liquid Thin Liquid: Within functional limits ?Presentation: Straw  ?  ?Nectar Thick Nectar Thick Liquid: Not tested   ?Honey Thick Honey Thick Liquid: Not tested   ?Puree Puree: Within functional limits   ?Solid ? ? ?  Solid: Impaired ?Oral Phase Impairments: Reduced lingual movement/coordination;Poor awareness of bolus ?Oral Phase Functional Implications: Prolonged oral transit (prolonged mastication) ?Pharyngeal Phase Impairments:  (none)  ? ?  ? ?Royce Macadamia ?07/01/2021,9:54 AM ?Darrow Bussing.Ed CCC-SLP ?Speech-Language Pathologist ?Office [phone] ? ? ? ? ? ?

## 2021-07-01 NOTE — ED Notes (Signed)
Admit MD at bedside

## 2021-07-01 NOTE — Progress Notes (Signed)
?                                  PROGRESS NOTE                                             ?                                                                                                                     ?                                         ? ? Patient Demographics:  ? ? Anita Price, is a 66 y.o. female, DOB - 01/18/1956, BJY:[id number] ? ?Outpatient Primary MD for the patient is Osei-Bonsu, Greggory StallionGeorge, MD    LOS - 0  Admit date - 06/30/2021   ? ?Chief Complaint  ?Patient presents with  ? Altered Mental Status  ?    ? ?Brief Narrative (HPI from H&P)   66 y.o. female with medical history significant of  DJD of multiple sites, chronic back pain, history of back surgery, chronic hepatitis C, tobacco abuse, HTN who presented to ED with AMS.  He was found less responsive at home by family, when she was brought in the ER she was covered with stool all over, she was incoherent and moaning but could not give any specific answers.  There was question of drug overdose versus withdrawal and she was admitted to the hospital.  Her head CT in the ER was unremarkable and so was her CT scan abdomen pelvis. ? ? Subjective:  ? ? Anita Price today in bed appears to be in no distress but still confused and remains an unreliable historian, denies any headache or chest pain. ? ? Assessment  & Plan :  ? ? ?Acute metabolic Encephalopathy  - last known well time was 3 days prior to hospital admission, head CT CT abdomen pelvis in the ER unremarkable along with stable ammonia levels.  She is on high doses of narcotics and drug overuse versus withdrawal is in the differential.  To avoid rapid withdrawal have placed her on one third her home dose narcotics, will also get EEG, obtain UA, hydrate, PT, OT and speech evaluation, monitor.  Still quite confused. ? ?Chronic pain syndrome due to chronic back pain - to avoid abrupt withdrawal less than home dose MS Contin, low-dose Flexeril, hold Lyrica.   Monitor. ? ?Diarrhea  - diarrhea seems to have improved, CT abdomen pelvis nonacute, continue to monitor.  Exam is highly inconsistent due to encephalopathy and inability to cooperate. ? ?Lactic acidosis -  Likely secondary to dehydration as no signs of infection at this point, continue IV  fluid maintenance.  Does not appear septic. ? ?Prolonged QT interval -  History of prolonged QT, Optimize electrolytes, checking Mg/CK, Avoid QT prolonging drugs, Continue telemetry ? ?Essential hypertension -blood pressure is improved but will continue IV Prn hydralazine along with low-dose oral Coreg if she can tolerate.    ? ?Hepatic cirrhosis in setting of untreated hepatitis C - Untreated, per daughter appointment were set up but she never did go to them , Ammonia wnl, INR wnl and platelets wnl, AST 62, around baseline  ? ?Tobacco use - Encourage cessation ? ? ?   ? ?Condition - Extremely Guarded ? ?Family Communication  : Called daughter Efraim Kaufmann on 07/01/2021 at 8:39 AM and message left ? ?Code Status :  Full ? ?Consults  :  None ? ?PUD Prophylaxis : PPI ? ? Procedures  :    ? ?CT head.  Nonacute. ? ?CT abdomen and pelvis.  Cirrhosis with nonspecific liver findings but nothing acute. ? ?EEG. ? ?   ? ?Disposition Plan  :   ? ?Status is: Observation ? ?DVT Prophylaxis  :   ? ?enoxaparin (LOVENOX) injection 40 mg Start: 06/30/21 1800 ? ?Lab Results  ?Component Value Date  ? PLT 192 07/01/2021  ? ? ?Diet :  ?Diet Order   ? ?       ?  Diet NPO time specified Except for: Sips with Meds  Diet effective now       ?  ? ?  ?  ? ?  ?  ? ?Inpatient Medications ? ?Scheduled Meds: ? carvedilol  3.125 mg Oral BID WC  ? cyclobenzaprine  7.5 mg Oral QHS  ? enoxaparin (LOVENOX) injection  40 mg Subcutaneous Q24H  ? morphine  15 mg Oral Q12H  ? ?Continuous Infusions: ? sodium chloride 100 mL/hr at 07/01/21 0726  ? ?PRN Meds:.hydrALAZINE, morphine injection ? ?Antibiotics  :   ? ?Anti-infectives (From admission, onward)  ? ? None  ? ?  ? ? ? Time  Spent in minutes  30 ? ? ?Susa Raring M.D on 07/01/2021 at 8:43 AM ? ?To page go to [email or web address]  ? ?[facility] -  Office  [phone] ? ?See all Orders from today for further details ? ? ? Objective:  ? ?Vitals:  ? 07/01/21 0400 07/01/21 0415 07/01/21 0430 07/01/21 0730  ?BP: (!) 210/102 (!) 198/103 (!) 183/109 (!) 142/83  ?Pulse: 92 92 90 (!) 109  ?Resp: (!) (!) 21  ?Temp:      ?TempSrc:      ?SpO2: 100% 100% 100% 100%  ?Weight:      ?Height:      ? ? ?Wt Readings from Last 3 Encounters:  ?06/30/21 49.9 kg  ?07/10/20 54.4 kg  ? ? ? ?Intake/Output Summary (Last 24 hours) at 07/01/2021 0843 ?Last data filed at 06/30/2021 1624 ?Gross per 24 hour  ?Intake 2000 ml  ?Output --  ?Net 2000 ml  ? ? ? ?Physical Exam ? ?Awake Alert, No new F.N deficits, Normal affect ?.AT,PERRAL ?Supple Neck, No JVD,   ?Symmetrical Chest wall movement, Good air movement bilaterally, CTAB ?RRR,No Gallops,Rubs or new Murmurs,  ?+ve B.Sounds, Abd Soft, No tenderness,   ?No Cyanosis, Clubbing or edema  ?  ? ?RN pressure injury documentation: ?  ? ? Data Review:  ? ? ?CBC ?Recent Labs  ?Lab 06/30/21 ?1226 07/01/21 ?0981  ?WBC 10.2 13.9*  ?HGB 18.7* 17.2*  ?HCT 53.0* 50.0*  ?PLT 151 192  ?MCV 88.2 88.3  ?  MCH 31.1 30.4  ?MCHC 35.3 34.4  ?RDW 13.0 13.2  ?LYMPHSABS 1.5  --   ?MONOABS 0.6  --   ?EOSABS 0.0  --   ?BASOSABS 0.0  --   ? ? ?Electrolytes ?Recent Labs  ?Lab 06/30/21 ?1224 06/30/21 ?1225 06/30/21 ?1226 06/30/21 ?1720 06/30/21 ?2304 06/30/21 ?2305 07/01/21 ?7616  ?NA  --   --  138  --   --   --  138  ?K  --   --  3.7  --   --   --  5.1  ?CL  --   --  102  --   --   --  105  ?CO2  --   --  21*  --   --   --  19*  ?GLUCOSE  --   --  103*  --   --   --  102*  ?BUN  --   --  16  --   --   --  13  ?CREATININE  --   --  0.70  --   --   --  0.72  ?CALCIUM  --   --  9.3  --   --   --  8.9  ?AST  --   --  4*  --   --   --   --   ?ALT  --   --  41  --   --   --   --   ?ALKPHOS  --   --  120  --   --   --   --   ?BILITOT  --   --  1.2   --   --   --   --   ?ALBUMIN  --   --  4.5  --   --   --   --   ?MG  --   --   --   --   --   --  2.2  ?LATICACIDVEN  --  3.6*  --  2.4*  --  1.8  --   ?INR  --   --  1.1  --   --   --   --   ?TSH  --   --   --   --  0.232*  --   --   ?AMMONIA 12  --   --   --   --   --   --   ?BNP  --   --   --   --   --   --  186.1*  ? ? ?------------------------------------------------------------------------------------------------------------------ ?No results for input(s): CHOL, HDL, LDLCALC, TRIG, CHOLHDL, LDLDIRECT in the last 72 hours. ? ?Lab Results  ?Component Value Date  ? HGBA1C 4.6 (L) 07/10/2020  ? ? ?Recent Labs  ?  06/30/21 ?2304  ?TSH 0.232*  ? ?------------------------------------------------------------------------------------------------------------------ ?ID Labs ?Recent Labs  ?Lab 06/30/21 ?1225 06/30/21 ?1226 06/30/21 ?1720 06/30/21 ?2305 07/01/21 ?0737  ?WBC  --  10.2  --   --  13.9*  ?PLT  --  151  --   --  192  ?LATICACIDVEN 3.6*  --  2.4* 1.8  --   ?CREATININE  --  0.70  --   --  0.72  ? ?Cardiac Enzymes ?No results for input(s): CKMB, TROPONINI, MYOGLOBIN in the last 168 hours. ? ?Invalid input(s): CK ? ?Radiology Reports ?CT HEAD WO CONTRAST ? ?Result Date: 06/30/2021 ?CLINICAL DATA:  66 year old female with altered mental status. EXAM: CT HEAD WITHOUT CONTRAST TECHNIQUE: Contiguous axial images  were obtained from the base of the skull through the vertex without intravenous contrast. RADIATION DOSE REDUCTION: This exam was performed according to the departmental dose-optimization program which includes automated exposure control, adjustment of the mA and/or kV according to patient size and/or use of iterative reconstruction technique. COMPARISON:  07/12/2020 CT and prior studies FINDINGS: Brain: No evidence of acute infarction, hemorrhage, hydrocephalus, extra-axial collection or mass lesion/mass effect. Mild atrophy again noted. Vascular: No hyperdense vessel or unexpected calcification. Skull: Normal.  Negative for fracture or focal lesion. Sinuses/Orbits: No acute finding. Other: Numerous dental caries and probable periapical abscesses of the UPPER teeth again noted. IMPRESSION: 1. No evidence of acute in

## 2021-07-02 DIAGNOSIS — I10 Essential (primary) hypertension: Secondary | ICD-10-CM | POA: Diagnosis not present

## 2021-07-02 LAB — CBC WITH DIFFERENTIAL/PLATELET
Abs Immature Granulocytes: 0.05 10*3/uL (ref 0.00–0.07)
Basophils Absolute: 0.1 10*3/uL (ref 0.0–0.1)
Basophils Relative: 0 %
Eosinophils Absolute: 0.1 10*3/uL (ref 0.0–0.5)
Eosinophils Relative: 1 %
HCT: 42.4 % (ref 36.0–46.0)
Hemoglobin: 14.7 g/dL (ref 12.0–15.0)
Immature Granulocytes: 0 %
Lymphocytes Relative: 23 %
Lymphs Abs: 3 10*3/uL (ref 0.7–4.0)
MCH: 31 pg (ref 26.0–34.0)
MCHC: 34.7 g/dL (ref 30.0–36.0)
MCV: 89.5 fL (ref 80.0–100.0)
Monocytes Absolute: 1.1 10*3/uL — ABNORMAL HIGH (ref 0.1–1.0)
Monocytes Relative: 8 %
Neutro Abs: 8.7 10*3/uL — ABNORMAL HIGH (ref 1.7–7.7)
Neutrophils Relative %: 68 %
Platelets: 135 10*3/uL — ABNORMAL LOW (ref 150–400)
RBC: 4.74 MIL/uL (ref 3.87–5.11)
RDW: 13.5 % (ref 11.5–15.5)
WBC: 12.9 10*3/uL — ABNORMAL HIGH (ref 4.0–10.5)
nRBC: 0 % (ref 0.0–0.2)

## 2021-07-02 LAB — COMPREHENSIVE METABOLIC PANEL
ALT: 32 U/L (ref 0–44)
AST: 46 U/L — ABNORMAL HIGH (ref 15–41)
Albumin: 3.3 g/dL — ABNORMAL LOW (ref 3.5–5.0)
Alkaline Phosphatase: 70 U/L (ref 38–126)
Anion gap: 8 (ref 5–15)
BUN: 15 mg/dL (ref 8–23)
CO2: 19 mmol/L — ABNORMAL LOW (ref 22–32)
Calcium: 8 mg/dL — ABNORMAL LOW (ref 8.9–10.3)
Chloride: 109 mmol/L (ref 98–111)
Creatinine, Ser: 0.67 mg/dL (ref 0.44–1.00)
GFR, Estimated: >60 mL/min (ref >60)
Glucose, Bld: 102 mg/dL — ABNORMAL HIGH (ref 70–99)
Potassium: 3.2 mmol/L — ABNORMAL LOW (ref 3.5–5.1)
Sodium: 136 mmol/L (ref 135–145)
Total Bilirubin: 1.2 mg/dL (ref 0.3–1.2)
Total Protein: 6 g/dL — ABNORMAL LOW (ref 6.5–8.1)

## 2021-07-02 LAB — PROCALCITONIN: Procalcitonin: <0.1 ng/mL

## 2021-07-02 LAB — MAGNESIUM: Magnesium: 2.1 mg/dL (ref 1.7–2.4)

## 2021-07-02 LAB — T3: T3, Total: 161 ng/dL (ref 71–180)

## 2021-07-02 LAB — BRAIN NATRIURETIC PEPTIDE: B Natriuretic Peptide: 185.5 pg/mL — ABNORMAL HIGH (ref 0.0–100.0)

## 2021-07-02 LAB — CK: Total CK: 347 U/L — ABNORMAL HIGH (ref 38–234)

## 2021-07-02 MED ORDER — CYCLOBENZAPRINE HCL 5 MG PO TABS
5.0000 mg | ORAL_TABLET | Freq: Every day | ORAL | Status: DC
  Administered 2021-07-02: 5 mg via ORAL
  Filled 2021-07-02: qty 1

## 2021-07-02 MED ORDER — PROCHLORPERAZINE EDISYLATE 10 MG/2ML IJ SOLN
10.0000 mg | Freq: Four times a day (QID) | INTRAMUSCULAR | Status: DC | PRN
Start: 2021-07-02 — End: 2021-07-03
  Administered 2021-07-02: 10 mg via INTRAVENOUS
  Filled 2021-07-02: qty 2

## 2021-07-02 MED ORDER — POTASSIUM CHLORIDE 10 MEQ/100ML IV SOLN
10.0000 meq | INTRAVENOUS | Status: AC
  Administered 2021-07-02 (×4): 10 meq via INTRAVENOUS
  Filled 2021-07-02 (×4): qty 100

## 2021-07-02 MED ORDER — ONDANSETRON HCL 4 MG/2ML IJ SOLN: 4.0000 mg | Freq: Four times a day (QID) | INTRAMUSCULAR | Status: DC | PRN

## 2021-07-02 MED ORDER — LACTATED RINGERS IV SOLN: INTRAVENOUS | Status: DC

## 2021-07-02 MED ORDER — ONDANSETRON HCL 4 MG/2ML IJ SOLN
INTRAMUSCULAR | Status: AC
  Filled 2021-07-02: qty 2

## 2021-07-02 MED ORDER — ACETAMINOPHEN 325 MG PO TABS
650.0000 mg | ORAL_TABLET | Freq: Four times a day (QID) | ORAL | Status: DC | PRN
  Administered 2021-07-03: 650 mg via ORAL
  Filled 2021-07-02: qty 2

## 2021-07-02 NOTE — Evaluation (Signed)
Physical Therapy Evaluation ?Patient Details ?Name: Anita Price ?MRN: [id number] ?DOB: 07/23/1955 ?Today's Date: 07/02/2021 ? ?History of Present Illness ? 66 y.o. female adm 06/30/21 with medical history significant of  DJD of multiple sites, chronic back pain, history of back surgery, chronic hepatitis C, tobacco abuse, HTN who presented to ED with AMS and decreased responsiveness Per chart question of drug overdose versus withdrawal.   Head CT was unremarkable and so was her CT scan abdomen pelvis.  ?Clinical Impression ?  ?Pt admitted secondary to problem above with deficits below. PTA patient was independent living in a second floor apartment (including driving and raising 2 grandchildren 57, 55 yo).  Pt currently requires min assist for balance when walking with RW x 25 feet. Daughter present and reports kids are staying with her and that family can provide intermittent assistance on discharge. Anticipate patient will benefit from PT to address problems listed below.Will continue to follow acutely to maximize functional mobility independence and safety.   ?   ?   ? ?Recommendations for follow up therapy are one component of a multi-disciplinary discharge planning process, led by the attending physician.  Recommendations may be updated based on patient status, additional functional criteria and insurance authorization. ? ?Follow Up Recommendations Home health PT ? ?  ?Assistance Recommended at Discharge Intermittent Supervision/Assistance  ?Patient can return home with the following ? A little help with walking and/or transfers;A little help with bathing/dressing/bathroom;Assistance with cooking/housework;Direct supervision/assist for medications management;Direct supervision/assist for financial management;Help with stairs or ramp for entrance ? ?  ?Equipment Recommendations Rolling walker (2 wheels)  ?Recommendations for Other Services ?    ?  ?Functional Status Assessment Patient has had a recent decline in  their functional status and demonstrates the ability to make significant improvements in function in a reasonable and predictable amount of time.  ? ?  ?Precautions / Restrictions Precautions ?Precautions: Fall ?Precaution Comments: denies h/o falls ?Restrictions ?Weight Bearing Restrictions: No  ? ?  ? ?Mobility ? Bed Mobility ?Overal bed mobility: Modified Independent ?  ?  ?  ?  ?  ?  ?General bed mobility comments: slow, no rails ?  ? ?Transfers ?Overall transfer level: Needs assistance ?Equipment used: 1 person hand held assist ?Transfers: Sit to/from Stand ?Sit to Stand: Min assist ?  ?  ?  ?  ?  ?  ?  ? ?Ambulation/Gait ?Ambulation/Gait assistance: Min assist ?Gait Distance (Feet): 12 Feet (toileted, 25) ?Assistive device: Rolling walker (2 wheels) ?Gait Pattern/deviations: Step-through pattern, Decreased stride length ?  ?  ?  ?General Gait Details: vc for proper use of RW; occasional assist with maneuvering RW ? ?Stairs ?  ?  ?  ?  ?  ? ?Wheelchair Mobility ?  ? ?Modified Rankin (Stroke Patients Only) ?  ? ?  ? ?Balance Overall balance assessment: Needs assistance ?Sitting-balance support: No upper extremity supported, Feet supported ?Sitting balance-Leahy Scale: Fair ?  ?  ?Standing balance support: No upper extremity supported, During functional activity ?Standing balance-Leahy Scale: Fair ?Standing balance comment: able to pull gown up without UE support prior to toileting ?  ?  ?  ?  ?  ?  ?  ?  ?  ?  ?  ?   ? ? ? ?Pertinent Vitals/Pain Pain Assessment ?Pain Assessment: No/denies pain  ? ? ?Home Living Family/patient expects to be discharged to:: Private residence ?Living Arrangements: Other relatives (grandkids 11, 73) ?Available Help at Discharge: Family;Available PRN/intermittently ?Type of Home: Apartment ?Home  Access: Stairs to enter ?Entrance Stairs-Rails: Right;Left ?Entrance Stairs-Number of Steps: 12 ?  ?Home Layout: One level ?Home Equipment: Agricultural consultant (2 wheels) ?   ?  ?Prior Function  Prior Level of Function : Independent/Modified Independent;Driving ?  ?  ?  ?  ?  ?  ?  ?  ?  ? ? ?Hand Dominance  ?   ? ?  ?Extremity/Trunk Assessment  ? Upper Extremity Assessment ?Upper Extremity Assessment: Defer to OT evaluation ?  ? ?Lower Extremity Assessment ?Lower Extremity Assessment: Generalized weakness ?  ? ?Cervical / Trunk Assessment ?Cervical / Trunk Assessment: Normal  ?Communication  ? Communication: No difficulties  ?Cognition Arousal/Alertness: Awake/alert ?Behavior During Therapy: Flat affect ?Overall Cognitive Status: Impaired/Different from baseline ?Area of Impairment: Problem solving ?  ?  ?  ?  ?  ?  ?  ?  ?  ?  ?  ?  ?  ?  ?Problem Solving: Slow processing, Decreased initiation, Requires verbal cues ?  ?  ?  ? ?  ?General Comments General comments (skin integrity, edema, etc.): VSS on monitor on RA; BP checked supine, sit and after ambulation with normal response ? ?  ?Exercises    ? ?Assessment/Plan  ?  ?PT Assessment Patient needs continued PT services  ?PT Problem List Decreased strength;Decreased activity tolerance;Decreased balance;Decreased mobility;Decreased cognition;Decreased knowledge of use of DME ? ?   ?  ?PT Treatment Interventions DME instruction;Gait training;Stair training;Functional mobility training;Therapeutic activities;Therapeutic exercise;Balance training;Patient/family education;Cognitive remediation   ? ?PT Goals (Current goals can be found in the Care Plan section)  ?Acute Rehab PT Goals ?Patient Stated Goal: feel better and go home ?PT Goal Formulation: With patient ?Time For Goal Achievement: 07/16/21 ?Potential to Achieve Goals: Good ? ?  ?Frequency Min 3X/week ?  ? ? ?Co-evaluation PT/OT/SLP Co-Evaluation/Treatment: Yes ?Reason for Co-Treatment: Necessary to address cognition/behavior during functional activity;To address functional/ADL transfers ?PT goals addressed during session: Mobility/safety with mobility;Balance;Proper use of DME ?  ?  ? ? ?  ?AM-PAC PT  "6 Clicks" Mobility  ?Outcome Measure Help needed turning from your back to your side while in a flat bed without using bedrails?: None ?Help needed moving from lying on your back to sitting on the side of a flat bed without using bedrails?: None ?Help needed moving to and from a bed to a chair (including a wheelchair)?: A Little ?Help needed standing up from a chair using your arms (e.g., wheelchair or bedside chair)?: A Little ?Help needed to walk in hospital room?: A Little ?Help needed climbing 3-5 steps with a railing? : A Lot ?6 Click Score: 19 ? ?  ?End of Session   ?Activity Tolerance: Treatment limited secondary to medical complications (Comment) (nausea/vomiting) ?Patient left: in chair;with call bell/phone within reach;with chair alarm set ?Nurse Communication: Mobility status;Other (comment) (N/V) ?PT Visit Diagnosis: Unsteadiness on feet (R26.81);Difficulty in walking, not elsewhere classified (R26.2) ?  ? ?Time: 8841-6606 ?PT Time Calculation (min) (ACUTE ONLY): 38 min ? ? ?Charges:   PT Evaluation ?$PT Eval Low Complexity: 1 Low ?  ?  ?   ? ? ? ?Jerolyn Center, PT ?Acute Rehabilitation Services  ?Pager [phone] ?Office [phone] ? ? ?Scherrie November Myeshia Fojtik ?07/02/2021, 9:17 AM ? ?

## 2021-07-02 NOTE — TOC Initial Note (Signed)
Transition of Care (TOC) - Initial/Assessment Note  ? ? ?Patient Details  ?Name: Anita Price ?MRN: [id number] ?Date of Birth: 1956-03-31 ? ?Transition of Care (TOC) CM/SW Contact:    ?Harriet Masson, RN ?Phone Number: ?07/02/2021, 1:35 PM ? ?Clinical Narrative:               ?Spoke to patient regarding transition needs. Patient plans to stay with her daughter: [address] [place], [place] [address]  ?Orders for Home Health and DME. ?Choice offered an patient defers to [facility] to find [facility] agency.  ?Spoke to [place] with [facility] and referral accepted ?Patient is agreeable to use in house provider, [facility], for DME needs.  ?Spoke to [place] with [facility] and ordered shower chair and walker to be delivered to address above. ? ? ?Address, phone number and PCP confirmed.  ? ? ?Expected Discharge Plan: Home w Home Health Services ?Barriers to Discharge: Continued Medical Work up ? ? ?Patient Goals and CMS Choice ?Patient states their goals for this hospitalization and ongoing recovery are:: go stay with daughter ?CMS Medicare.gov Compare Post Acute Care list provided to:: Patient ?Choice offered to / list presented to : Patient ? ?Expected Discharge Plan and Services ?Expected Discharge Plan: Home w Home Health Services ?  ?Discharge Planning Services: CM Consult ?Post Acute Care Choice: Durable Medical Equipment, Home Health ?Living arrangements for the past 2 months: Single Family Home ?                ?DME Arranged: Other see comment, Walker ?DME Agency: [facility] ?Date DME Agency Contacted: 07/02/21 ?Time DME Agency Contacted: 1334 ?Representative spoke with at DME Agency: Leavy Cella ?HH Arranged: PT ?HH Agency: [facility] ?Date HH Agency Contacted: 07/02/21 ?Time HH Agency Contacted: 1335 ?Representative spoke with at [facility] Agency: Kandee Keen ? ?Prior Living Arrangements/Services ?Living arrangements for the past 2 months: Single Family Home ?Lives with:: Adult Children ?Patient language and need for interpreter reviewed::  Yes ?Do you feel safe going back to the place where you live?: Yes      ?Need for Family Participation in Patient Care: Yes (Comment) ?Care giver support system in place?: Yes (comment) ?  ?Criminal Activity/Legal Involvement Pertinent to Current Situation/Hospitalization: No - Comment as needed ? ?Activities of Daily Living ?  ?  ? ?Permission Sought/Granted ?Permission sought to share information with : Magazine features editor ?Permission granted to share information with : Yes, Verbal Permission Granted ?   ? Permission granted to share info w AGENCY: HH ?   ?   ? ?Emotional Assessment ?Appearance:: Appears stated age ?Attitude/Demeanor/Rapport: Engaged ?Affect (typically observed): Accepting ?Orientation: : Oriented to Self, Oriented to Place, Oriented to  Time, Oriented to Situation ?Alcohol / Substance Use: Not Applicable ?Psych Involvement: No (comment) ? ?Admission diagnosis:  Encephalopathy acute [G93.40] ?Acute encephalopathy [G93.40] ?Patient Active Problem List  ? Diagnosis Date Noted  ? Lactic acidosis 06/30/2021  ? Encephalopathy acute 06/30/2021  ? Diarrhea 06/30/2021  ? Protein-calorie malnutrition, severe 07/12/2020  ? 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 Sepsis due to pneumonia (HCC) 07/10/2020  ? Essential hypertension 07/10/2020  ? Hepatitis C 07/10/2020  ? Tobacco use 07/10/2020  ? Macrocytosis 07/10/2020  ? Hyperglycemia 07/10/2020  ? Prolonged QT interval 07/10/2020  ? 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 ?PCP:  Jackie Plum, MD ?Pharmacy:   ?[facility] [id number] - [place], [place] - [address] ?[address] ?[place] [address] ?Phone: [phone] Fax: [phone] ? ? ? ? ?Social Determinants of Health (SDOH) Interventions ?  ? ?Readmission Risk Interventions ? ?  07/13/2020  ? 11:39 AM  ?Readmission Risk Prevention Plan  ?Post Dischage Appt Not Complete  ?Appt Comments attempted to call PCP to schedule  f/u- however office informed us that patient would need to call for appointment  ?Medication Screening Complete  ?Transportation Screening Complete  ? ? ? ?

## 2021-07-02 NOTE — Progress Notes (Signed)
?  Transition of Care (TOC) Screening Note ? ? ?Patient Details  ?Name: Anita Price ?Date of Birth: [birth year] ? ? ?Transition of Care (TOC) CM/SW Contact:    ?Harriet Masson, RN ?Phone Number: ?07/02/2021, 9:36 AM ? ? ? ?Transition of Care Department [facility]) has reviewed patient and no TOC needs have been identified at this time. We will continue to monitor patient advancement through interdisciplinary progression rounds. If new patient transition needs arise, please place a TOC consult. ? ? ?

## 2021-07-02 NOTE — Evaluation (Signed)
Occupational Therapy Evaluation ?Patient Details ?Name: Anita Price ?MRN: [id number] ?DOB: [birth year] ?Today's Date: 07/02/2021 ? ? ?History of Present Illness 66 y.o. female adm 06/30/21 with medical history significant of  DJD of multiple sites, chronic back pain, history of back surgery, chronic hepatitis C, tobacco abuse, HTN who presented to ED with AMS and decreased responsiveness Per chart question of drug overdose versus withdrawal.   Head CT was unremarkable and so was her CT scan abdomen pelvis.  ? ?Clinical Impression ?  ?PTA, pt reports Independence in all daily tasks without need for AD. Pt lives with 56 and 49 y/o grandkids that she cares for in second floor apartment. Pt presents now with deficits in cognition, dynamic standing balance, strength and endurance. Pt overall Setup for UB ADL, min guard for LB ADLs and Min A for bathroom mobility using RW (assist needed to direct DME). Pt's daughter present and endorses ability to provide increased support at discharge; inquiring about RW and shower chair- CM aware. Anticipate no OT needs at DC but will continue to follow acutely to progress strength and endurance.  ? ?VSS on RA ?   ? ?Recommendations for follow up therapy are one component of a multi-disciplinary discharge planning process, led by the attending physician.  Recommendations may be updated based on patient status, additional functional criteria and insurance authorization.  ? ?Follow Up Recommendations ? No OT follow up  ?  ?Assistance Recommended at Discharge Intermittent Supervision/Assistance  ?Patient can return home with the following A little help with bathing/dressing/bathroom;Assistance with cooking/housework;Assist for transportation;Help with stairs or ramp for entrance ? ?  ?Functional Status Assessment ? Patient has had a recent decline in their functional status and demonstrates the ability to make significant improvements in function in a reasonable and predictable amount of time.   ?Equipment Recommendations ? Tub/shower seat;Other (comment) (Rolling walker)  ?  ?Recommendations for Other Services   ? ? ?  ?Precautions / Restrictions Precautions ?Precautions: Fall ?Precaution Comments: denies h/o falls ?Restrictions ?Weight Bearing Restrictions: No  ? ?  ? ?Mobility Bed Mobility ?Overal bed mobility: Modified Independent ?  ?  ?  ?  ?  ?  ?General bed mobility comments: slow, no rails ?  ? ?Transfers ?Overall transfer level: Needs assistance ?Equipment used: 1 person hand held assist ?Transfers: Sit to/from Stand ?Sit to Stand: Min assist ?  ?  ?  ?  ?  ?  ?  ? ?  ?Balance Overall balance assessment: Needs assistance ?Sitting-balance support: No upper extremity supported, Feet supported ?Sitting balance-Leahy Scale: Fair ?  ?  ?Standing balance support: No upper extremity supported, During functional activity ?Standing balance-Leahy Scale: Fair ?Standing balance comment: able to pull gown up without UE support prior to toileting ?  ?  ?  ?  ?  ?  ?  ?  ?  ?  ?  ?   ? ?ADL either performed or assessed with clinical judgement  ? ?ADL Overall ADL's : Needs assistance/impaired ?Eating/Feeding: Independent ?  ?Grooming: Supervision/safety;Standing ?  ?Upper Body Bathing: Set up;Sitting ?  ?Lower Body Bathing: Min guard;Sit to/from stand ?  ?Upper Body Dressing : Set up;Sitting ?  ?Lower Body Dressing: Min guard;Sit to/from stand ?  ?Toilet Transfer: Ambulation;Rolling walker (2 wheels);BSC/3in1;Regular Toilet;Grab bars;Minimal assistance ?Toilet Transfer Details (indicate cue type and reason): Bsc over toilet; assist for RW mgmt ?Toileting- Clothing Manipulation and Hygiene: Sit to/from stand;Sitting/lateral lean;Min guard ?Toileting - Clothing Manipulation Details (indicate cue type and reason): light  assist for clothing mgmt of back gown though anticipate pt able to complete with her regular clothes on. able to perform peri care seated on toilet ?  ?  ?Functional mobility during ADLs: Minimal  assistance;Rolling walker (2 wheels) ?   ? ? ? ?Vision Baseline Vision/History: 0 No visual deficits ?Ability to See in Adequate Light: 0 Adequate ?Patient Visual Report: No change from baseline ?Vision Assessment?: No apparent visual deficits  ?   ?Perception   ?  ?Praxis   ?  ? ?Pertinent Vitals/Pain Pain Assessment ?Pain Assessment: No/denies pain  ? ? ? ?Hand Dominance   ?  ?Extremity/Trunk Assessment Upper Extremity Assessment ?Upper Extremity Assessment: Generalized weakness ?  ?Lower Extremity Assessment ?Lower Extremity Assessment: Defer to PT evaluation ?  ?Cervical / Trunk Assessment ?Cervical / Trunk Assessment: Normal ?  ?Communication Communication ?Communication: No difficulties ?  ?Cognition Arousal/Alertness: Awake/alert ?Behavior During Therapy: Flat affect ?Overall Cognitive Status: Impaired/Different from baseline ?Area of Impairment: Problem solving ?  ?  ?  ?  ?  ?  ?  ?  ?  ?  ?  ?  ?  ?  ?Problem Solving: Slow processing, Decreased initiation, Requires verbal cues ?General Comments: follows commands consistently, slower response time and benefits from problem solving cues ?  ?  ?General Comments  daughter present, endorses ability to check in and assist with heavier tasks; assist with showering tasks initially ? ?  ?Exercises   ?  ?Shoulder Instructions    ? ? ?Home Living Family/patient expects to be discharged to:: Private residence ?Living Arrangements: Other relatives (grandkids 66, 95) ?Available Help at Discharge: Family;Available PRN/intermittently ?Type of Home: Apartment ?Home Access: Stairs to enter ?Entrance Stairs-Number of Steps: 12 ?Entrance Stairs-Rails: Right;Left ?Home Layout: One level ?  ?  ?Bathroom Shower/Tub: Tub/shower unit ?  ?Bathroom Toilet: Standard ?  ?  ?Home Equipment: Agricultural consultant (2 wheels) ?  ?  ?  ? ?  ?Prior Functioning/Environment Prior Level of Function : Independent/Modified Independent;Driving ?  ?  ?  ?  ?  ?  ?  ?  ?  ? ?  ?  ?OT Problem List:  Decreased strength;Decreased activity tolerance;Decreased cognition;Decreased knowledge of use of DME or AE;Impaired balance (sitting and/or standing) ?  ?   ?OT Treatment/Interventions: Self-care/ADL training;Therapeutic exercise;Energy conservation;DME and/or AE instruction;Therapeutic activities;Patient/family education;Balance training  ?  ?OT Goals(Current goals can be found in the care plan section) Acute Rehab OT Goals ?Patient Stated Goal: regain strength, see my grandkids ?OT Goal Formulation: With patient/family ?Time For Goal Achievement: 07/16/21 ?Potential to Achieve Goals: Good  ?OT Frequency: Min 2X/week ?  ? ?Co-evaluation PT/OT/SLP Co-Evaluation/Treatment: Yes ?Reason for Co-Treatment: Necessary to address cognition/behavior during functional activity;To address functional/ADL transfers ?PT goals addressed during session: Mobility/safety with mobility;Balance;Proper use of DME ?OT goals addressed during session: ADL's and self-care ?  ? ?  ?AM-PAC OT "6 Clicks" Daily Activity     ?Outcome Measure Help from another person eating meals?: None ?Help from another person taking care of personal grooming?: A Little ?Help from another person toileting, which includes using toliet, bedpan, or urinal?: A Little ?Help from another person bathing (including washing, rinsing, drying)?: A Little ?Help from another person to put on and taking off regular upper body clothing?: A Little ?Help from another person to put on and taking off regular lower body clothing?: A Little ?6 Click Score: 19 ?  ?End of Session Equipment Utilized During Treatment: Rolling walker (2 wheels);Gait belt ?Nurse Communication:  Mobility status;Other (comment) (vomiting) ? ?Activity Tolerance: Patient tolerated treatment well ?Patient left: in chair;with call bell/phone within reach;with chair alarm set;with family/visitor present ? ?OT Visit Diagnosis: Unsteadiness on feet (R26.81);Other abnormalities of gait and mobility (R26.89);Muscle  weakness (generalized) (M62.81)  ?              ?Time: [id number] ?OT Time Calculation (min): 38 min ?Charges:  OT General Charges ?$OT Visit: 1 Visit ?OT Evaluation ?$OT Eval Moderate Complexity: 1 Mod ? ?Rocky [employer]sJu

## 2021-07-02 NOTE — Progress Notes (Addendum)
?                                  PROGRESS NOTE                                             ?                                                                                                                     ?                                         ? ? Patient Demographics:  ? ? Anita Price, is a 66 y.o. female, DOB - June 26, 1955, TGG:[id number] ? ?Outpatient Primary MD for the patient is Osei-Bonsu, Greggory Stallion, MD    LOS - 1  Admit date - 06/30/2021   ? ?Chief Complaint  ?Patient presents with  ? Altered Mental Status  ?    ? ?Brief Narrative (HPI from H&P)   66 y.o. female with medical history significant of  DJD of multiple sites, chronic back pain, history of back surgery, chronic hepatitis C, tobacco abuse, HTN who presented to ED with AMS.  He was found less responsive at home by family, when she was brought in the ER she was covered with stool all over, she was incoherent and moaning but could not give any specific answers.  There was question of drug overdose versus withdrawal and she was admitted to the hospital.  Her head CT in the ER was unremarkable and so was her CT scan abdomen pelvis. ? ? Subjective:  ? ?Patient in bed, appears comfortable, denies any headache, no fever, no chest pain or pressure, no shortness of breath , no abdominal pain. No new focal weakness. ? ? Assessment  & Plan :  ? ? ?Acute metabolic Encephalopathy  - last known well time was 3 days prior to hospital admission, head CT CT abdomen pelvis in the ER unremarkable along with stable ammonia levels.  Likely combination of UTI and excessive narcotic use, with supportive care much improved continue IV fluids along with IV antibiotics and cutting down of her narcotics and sedating medications.  She and her daughter have been counseled extensively on 07/02/2021 against overuse of narcotics. ? ?Chronic pain syndrome due to chronic back pain - to avoid abrupt withdrawal less than home dose MS Contin, low-dose  Flexeril, hold Lyrica.  Monitor. ? ?Diarrhea  - diarrhea has resolved.  CT abdomen pelvis nonacute.  Exam benign. ? ?Lactic acidosis -  Likely secondary to dehydration as no signs of infection at this point, continue IV fluid maintenance.  Does not appear septic. ? ?Prolonged QT interval -  History of prolonged  QT, Optimize electrolytes, checking Mg/CK, Avoid QT prolonging drugs, Continue telemetry ? ?Essential hypertension -blood pressure is improved but will continue IV Prn hydralazine along with low-dose oral Coreg if she can tolerate.    ? ?Hepatic cirrhosis in setting of untreated hepatitis C - Untreated, per daughter appointment were set up but she never did go to them , Ammonia wnl, INR wnl and platelets wnl, AST 62, around baseline. ? ?Hypokalemia.  Replaced.   ? ?Tobacco use - Encouraged cessation ? ? ?   ? ?Condition - Extremely Guarded ? ?Family Communication  : Called daughter Efraim KaufmannMelissa on 07/01/2021 at 8:39 AM and message left ? ?Code Status :  Full ? ?Consults  :  None ? ?PUD Prophylaxis : PPI ? ? Procedures  :    ? ?CT head.  Nonacute. ? ?CT abdomen and pelvis.  Cirrhosis with nonspecific liver findings but nothing acute. ? ?EEG. Non specific changes, no seizure. ? ?   ? ?Disposition Plan  :   ? ?Status is: Observation ? ?DVT Prophylaxis  :   ? ?enoxaparin (LOVENOX) injection 40 mg Start: 06/30/21 1800 ? ?Lab Results  ?Component Value Date  ? PLT 135 (L) 07/02/2021  ? ? ?Diet :  ?Diet Order   ? ?       ?  DIET DYS 3 Room service appropriate? Yes; Fluid consistency: Thin  Diet effective now       ?  ? ?  ?  ? ?  ?  ? ?Inpatient Medications ? ?Scheduled Meds: ? carvedilol  3.125 mg Oral BID WC  ? chlorhexidine  15 mL Mouth Rinse BID  ? Chlorhexidine Gluconate Cloth  6 each Topical Q0600  ? cyclobenzaprine  5 mg Oral QHS  ? enoxaparin (LOVENOX) injection  40 mg Subcutaneous Q24H  ? mouth rinse  15 mL Mouth Rinse q12n4p  ? morphine  15 mg Oral Q12H  ? mupirocin ointment  1 application. Nasal BID   ? ?Continuous Infusions: ? cefTRIAXone (ROCEPHIN)  IV 1 g (07/01/21 1517)  ? lactated ringers 100 mL/hr at 07/02/21 0819  ? potassium chloride 10 mEq (07/02/21 0821)  ? ?PRN Meds:.acetaminophen, hydrALAZINE, prochlorperazine ? ?Antibiotics  :   ? ?Anti-infectives (From admission, onward)  ? ? Start     Dose/Rate Route Frequency Ordered Stop  ? 07/01/21 1515  cefTRIAXone (ROCEPHIN) 1 g in sodium chloride 0.9 % 100 mL IVPB       ? 1 g ?200 mL/hr over 30 Minutes Intravenous Every 24 hours 07/01/21 1420    ? ?  ? ? ? Time Spent in minutes  30 ? ? ?Susa RaringPrashant Izza Bickle M.D on 07/02/2021 at 9:17 AM ? ?To page go to [email or web address]  ? ?[facility] -  Office  [phone] ? ?See all Orders from today for further details ? ? ? Objective:  ? ?Vitals:  ? 07/01/21 2000 07/01/21 2341 07/02/21 0321 07/02/21 0844  ?BP: 125/70 99/64 114/73 128/80  ?Pulse: 82 76 74 90  ?Resp: (!) 21 15 13 20   ?Temp: 98.3 ?F (36.8 ?C) 98 ?F (36.7 ?C) 98.5 ?F (36.9 ?C)   ?TempSrc: Axillary Axillary Axillary   ?SpO2: 97% 96% 94% 100%  ?Weight:      ?Height:      ? ? ?Wt Readings from Last 3 Encounters:  ?06/30/21 49.9 kg  ?07/10/20 54.4 kg  ? ? ? ?Intake/Output Summary (Last 24 hours) at 07/02/2021 0917 ?Last data filed at 07/02/2021 [id number] ?Gross per 24 hour  ?Intake 2239.67 ml  ?  Output 300 ml  ?Net 1939.67 ml  ? ? ? ?Physical Exam ? ?Awake Alert, No new F.N deficits, Normal affect ?Ray.AT,PERRAL ?Supple Neck, No JVD,   ?Symmetrical Chest wall movement, Good air movement bilaterally, CTAB ?RRR,No Gallops, Rubs or new Murmurs,  ?+ve B.Sounds, Abd Soft, No tenderness,   ?No Cyanosis, Clubbing or edema  ? ?  ? ? Data Review:  ? ? ?CBC ?Recent Labs  ?Lab 06/30/21 ?1226 07/01/21 ?6578 07/02/21 ?0114  ?WBC 10.2 13.9* 12.9*  ?HGB 18.7* 17.2* 14.7  ?HCT 53.0* 50.0* 42.4  ?PLT 151 192 135*  ?MCV 88.2 88.3 89.5  ?MCH 31.1 30.4 31.0  ?MCHC 35.3 34.4 34.7  ?RDW 13.0 13.2 13.5  ?LYMPHSABS 1.5  --  3.0  ?MONOABS 0.6  --  1.1*  ?EOSABS 0.0  --  0.1  ?BASOSABS 0.0  --  0.1   ? ? ?Electrolytes ?Recent Labs  ?Lab 06/30/21 ?1224 06/30/21 ?1225 06/30/21 ?1226 06/30/21 ?1720 06/30/21 ?2304 06/30/21 ?2305 07/01/21 ?4696 07/02/21 ?0114 07/02/21 ?0456  ?NA  --   --  138  --   --   --  138 136  --   ?K  --   --  3.7  --   --   --  5.1 3.2*  --   ?CL  --   --  102  --   --   --  105 109  --   ?CO2  --   --  21*  --   --   --  19* 19*  --   ?GLUCOSE  --   --  103*  --   --   --  102* 102*  --   ?BUN  --   --  16  --   --   --  13 15  --   ?CREATININE  --   --  0.70  --   --   --  0.72 0.67  --   ?CALCIUM  --   --  9.3  --   --   --  8.9 8.0*  --   ?AST  --   --  62*  --   --   --   --  46*  --   ?ALT  --   --  41  --   --   --   --  32  --   ?ALKPHOS  --   --  120  --   --   --   --  70  --   ?BILITOT  --   --  1.2  --   --   --   --  1.2  --   ?ALBUMIN  --   --  4.5  --   --   --   --  3.3*  --   ?MG  --   --   --   --   --   --  2.2 2.1  --   ?PROCALCITON  --   --   --   --   --   --  <0.10  --  <0.10  ?LATICACIDVEN  --  3.6*  --  2.4*  --  1.8  --   --   --   ?INR  --   --  1.1  --   --   --   --   --   --   ?TSH  --   --   --   --  0.232*  --   --   --   --   ?  AMMONIA 12  --   --   --   --   --   --   --   --   ?BNP  --   --   --   --   --   --  186.1* 185.5*  --   ? ? ?------------------------------------------------------------------------------------------------------------------ ?No results for input(s): CHOL, HDL, LDLCALC, TRIG, CHOLHDL, LDLDIRECT in the last 72 hours. ? ?Lab Results  ?Component Value Date  ? HGBA1C 4.6 (L) 07/10/2020  ? ? ?Recent Labs  ?  06/30/21 ?2304  ?TSH 0.232*  ? ?------------------------------------------------------------------------------------------------------------------ ?ID Labs ?Recent Labs  ?Lab 06/30/21 ?1225 06/30/21 ?1226 06/30/21 ?1720 06/30/21 ?2305 07/01/21 ?2800 07/02/21 ?0114 07/02/21 ?0456  ?WBC  --  10.2  --   --  13.9* 12.9*  --   ?PLT  --  151  --   --  192 135*  --   ?PROCALCITON  --   --   --   --  <0.10  --  <0.10  ?LATICACIDVEN 3.6*  --   2.4* 1.8  --   --   --   ?CREATININE  --  0.70  --   --  0.72 0.67  --   ? ?Cardiac Enzymes ?No results for input(s): CKMB, TROPONINI, MYOGLOBIN in the last 168 hours. ? ?Invalid input(s): CK ? ?Radiology Reports ?CT HE

## 2021-07-03 DIAGNOSIS — G9340 Encephalopathy, unspecified: Secondary | ICD-10-CM | POA: Diagnosis not present

## 2021-07-03 LAB — COMPREHENSIVE METABOLIC PANEL
ALT: 42 U/L (ref 0–44)
AST: 65 U/L — ABNORMAL HIGH (ref 15–41)
Albumin: 3.1 g/dL — ABNORMAL LOW (ref 3.5–5.0)
Alkaline Phosphatase: 74 U/L (ref 38–126)
Anion gap: 8 (ref 5–15)
BUN: 11 mg/dL (ref 8–23)
CO2: 21 mmol/L — ABNORMAL LOW (ref 22–32)
Calcium: 8.2 mg/dL — ABNORMAL LOW (ref 8.9–10.3)
Chloride: 109 mmol/L (ref 98–111)
Creatinine, Ser: 0.63 mg/dL (ref 0.44–1.00)
GFR, Estimated: >60 mL/min (ref >60)
Glucose, Bld: 88 mg/dL (ref 70–99)
Potassium: 3.1 mmol/L — ABNORMAL LOW (ref 3.5–5.1)
Sodium: 138 mmol/L (ref 135–145)
Total Bilirubin: 0.8 mg/dL (ref 0.3–1.2)
Total Protein: 5.8 g/dL — ABNORMAL LOW (ref 6.5–8.1)

## 2021-07-03 LAB — CBC WITH DIFFERENTIAL/PLATELET
Abs Immature Granulocytes: 0.01 10*3/uL (ref 0.00–0.07)
Basophils Absolute: 0.1 10*3/uL (ref 0.0–0.1)
Basophils Relative: 1 %
Eosinophils Absolute: 0.1 10*3/uL (ref 0.0–0.5)
Eosinophils Relative: 2 %
HCT: 43 % (ref 36.0–46.0)
Hemoglobin: 15.1 g/dL — ABNORMAL HIGH (ref 12.0–15.0)
Immature Granulocytes: 0 %
Lymphocytes Relative: 41 %
Lymphs Abs: 2.1 10*3/uL (ref 0.7–4.0)
MCH: 31.1 pg (ref 26.0–34.0)
MCHC: 35.1 g/dL (ref 30.0–36.0)
MCV: 88.7 fL (ref 80.0–100.0)
Monocytes Absolute: 0.4 10*3/uL (ref 0.1–1.0)
Monocytes Relative: 7 %
Neutro Abs: 2.5 10*3/uL (ref 1.7–7.7)
Neutrophils Relative %: 49 %
Platelets: UNDETERMINED 10*3/uL (ref 150–400)
RBC: 4.85 MIL/uL (ref 3.87–5.11)
RDW: 13.2 % (ref 11.5–15.5)
WBC: 5.2 10*3/uL (ref 4.0–10.5)
nRBC: 0 % (ref 0.0–0.2)

## 2021-07-03 LAB — MAGNESIUM: Magnesium: 1.9 mg/dL (ref 1.7–2.4)

## 2021-07-03 LAB — CK: Total CK: 211 U/L (ref 38–234)

## 2021-07-03 LAB — BRAIN NATRIURETIC PEPTIDE: B Natriuretic Peptide: 363.6 pg/mL — ABNORMAL HIGH (ref 0.0–100.0)

## 2021-07-03 MED ORDER — CARVEDILOL 3.125 MG PO TABS: 3.1250 mg | ORAL_TABLET | Freq: Two times a day (BID) | ORAL | 0 refills | Status: DC

## 2021-07-03 MED ORDER — POTASSIUM CHLORIDE CRYS ER 20 MEQ PO TBCR
40.0000 meq | EXTENDED_RELEASE_TABLET | Freq: Once | ORAL | Status: AC
  Administered 2021-07-03: 40 meq via ORAL
  Filled 2021-07-03: qty 2

## 2021-07-03 MED ORDER — POTASSIUM CHLORIDE 10 MEQ/100ML IV SOLN
10.0000 meq | INTRAVENOUS | Status: AC
  Administered 2021-07-03 (×4): 10 meq via INTRAVENOUS
  Filled 2021-07-03 (×4): qty 100

## 2021-07-03 MED ORDER — CEPHALEXIN 500 MG PO CAPS: 500.0000 mg | ORAL_CAPSULE | Freq: Three times a day (TID) | ORAL | 0 refills | Status: AC

## 2021-07-03 MED ORDER — MORPHINE SULFATE ER 15 MG PO TBCR: 15.0000 mg | EXTENDED_RELEASE_TABLET | Freq: Two times a day (BID) | ORAL | Status: DC

## 2021-07-03 MED ORDER — CYCLOBENZAPRINE HCL 10 MG PO TABS: 10.0000 mg | ORAL_TABLET | Freq: Every evening | ORAL | 0 refills | Status: DC | PRN

## 2021-07-03 NOTE — Progress Notes (Signed)
When assisting pt back in bed she stated "my knees really hurt, I don't know what that doctor cut my pain medicine down to but I can't wait to get home." Pt asked what meds she was on for pain and dose and I informed her what was on the [facility] ?

## 2021-07-03 NOTE — Discharge Summary (Signed)
?                                                                                ? ?Anita Price GLO:[id number] DOB: 1955-09-16 DOA: 06/30/2021 ? ?PCP: Jackie Plum, MD ? ?Admit date: 06/30/2021  Discharge date: 07/03/2021 ? ?Admitted From: Home   Disposition:  Home ? ? ?Recommendations for Outpatient Follow-up:  ? ?Follow up with PCP in 1-2 weeks ? ?PCP Please obtain BMP/CBC, 2 view CXR in 1week,  (see Discharge instructions)  ? ?PCP Please follow up on the following pending results: please cut down Narcotics and sedative Meds ? ? ?Home Health: PT, OT, SLP, RN if qualifies   ?Equipment/Devices: as below  ?Consultations: None  ?Discharge Condition: Stable    ?CODE STATUS: Full    ?Diet Recommendation: Heart Healthy  ? ?Diet Order   ? ?       ?  Diet - low sodium heart healthy       ?  ?  DIET DYS 3 Room service appropriate? Yes; Fluid consistency: Thin  Diet effective now       ?  ? ?  ?  ? ?  ?  ? ?Chief Complaint  ?Patient presents with  ? Altered Mental Status  ?  ? ?Brief history of present illness from the day of admission and additional interim summary   ? ? 66 y.o. female with medical history significant of  DJD of multiple sites, chronic back pain, history of back surgery, chronic hepatitis C, tobacco abuse, HTN who presented to ED with AMS.  He was found less responsive at home by family, when she was brought in the ER she was covered with stool all over, she was incoherent and moaning but could not give any specific answers.  There was question of drug overdose versus withdrawal and she was admitted to the hospital.  Her head CT in the ER was unremarkable and so was her CT scan abdomen pelvis. ? ?                                                               Hospital Course  ? ?Acute metabolic Encephalopathy  - last known well time was 3 days prior to hospital admission, head CT, CT abdomen pelvis in the ER  unremarkable along with stable ammonia levels.  Likely combination of UTI and excessive narcotic use, with supportive care much improved and now at baseline.  Narcotics were cut along with her Flexeril dose, she was placed on Rocephin empirically for UTI, much better and will be discharged home on adjusted dose of narcotics with 4 more days of oral antibiotics.  Request PCP to monitor narcotic use closely, if she qualifies she will get home RN, PT, OT and speech follow-up as well. ?  ?Chronic pain syndrome due to chronic back pain - to avoid abrupt withdrawal less than home dose MS Contin, low-dose Flexeril, resume home dose Lyrica, PCP to monitor narcotic use  and try to cut down the dosages. ?  ?Diarrhea  - diarrhea has resolved.  CT abdomen pelvis nonacute.  Exam benign. ?  ?Lactic acidosis -  Likely secondary to dehydration as no signs of infection at this point, continue IV fluid maintenance.  Does not appear septic. ?  ?Prolonged QT interval -  History of prolonged QT, Optimize electrolytes, checking Mg/CK, Avoid QT prolonging drugs, QTc prolongation on telemetry now has resolved outpatient monitoring by PCP intermittent. ?  ?Essential hypertension -blood pressure is improved but will continue - placed on low-dose Coreg ? ?Hepatic cirrhosis in setting of untreated hepatitis C - Untreated, PCP to address outpatient. ?  ?Hypokalemia.  Replaced.   ?  ?Tobacco use - Encouraged cessation ? ? ?Discharge diagnosis   ? ? ?Principal Problem: ?  Encephalopathy acute ?Active Problems: ?  Diarrhea ?  Chronic pain syndrome due to chronic back pain  ?  Lactic acidosis ?  Prolonged QT interval ?  Essential hypertension ?  Hepatic cirrhosis in setting of untreated hepatitis C  ?  Tobacco use ?  Hepatitis C ? ? ? ?Discharge instructions   ? ?Discharge Instructions   ? ? [facility]   Complete by: As directed ?  ? Diet - low sodium heart healthy   Complete by: As directed ?  ? Discharge instructions    Complete by: As directed ?  ? Follow with Primary MD Jackie Plumsei-Bonsu, George, MD in 7 days  ? ?Get CBC, CMP, 2 view Chest X ray -  checked next visit within 1 week by Primary MD   ? ?Activity: As tolerated with Full fall precautions use walker/cane & assistance as needed ? ?Disposition Home   ? ?Diet: Heart Healthy   ? ?Special Instructions: If you have smoked or chewed Tobacco  in the last 2 yrs please stop smoking, stop any regular Alcohol  and or any Recreational drug use. ? ?On your next visit with your primary care physician please Get Medicines reviewed and adjusted. ? ?Please request your Prim.MD to go over all Hospital Tests and Procedure/Radiological results at the follow up, please get all Hospital records sent to your Prim MD by signing hospital release before you go home. ? ?If you experience worsening of your admission symptoms, develop shortness of breath, life threatening emergency, suicidal or homicidal thoughts you must seek medical attention immediately by calling 911 or calling your MD immediately  if symptoms less severe. ? ?You Must read complete instructions/literature along with all the possible adverse reactions/side effects for all the Medicines you take and that have been prescribed to you. Take any new Medicines after you have completely understood and accpet all the possible adverse reactions/side effects.  ? Increase activity slowly   Complete by: As directed ?  ? ?  ? ? ?Discharge Medications  ? ?Allergies as of 07/03/2021   ? ?   Reactions  ? Effersyllium [psyllium]   ? Makes pt feel bad   ? Neurontin [gabapentin]   ? Makes pt feel bad  ? Tramadol   ? Makes pt feel bad  ? ?  ? ?  ?Medication List  ?  ? ?STOP taking these medications   ? ?losartan 50 MG tablet ?Commonly known as: COZAAR ?  ? ?  ? ?TAKE these medications   ? ?acetaminophen 500 MG tablet ?Commonly known as: TYLENOL ?Take 500 mg by mouth every 6 (six) hours as needed for headache. ?  ?albuterol 108 (90 Base) MCG/ACT  inhaler ?Commonly known as: VENTOLIN HFA ?Inhale into the lungs every 6 (six) hours as needed for wheezing or shortness of breath. ?  ?carvedilol 3.125 MG tablet ?Commonly known as: COREG ?Take 1 tablet (3.125 mg total) by mouth 2 (two) times daily with a meal. ?  ?cephALEXin 500 MG capsule ?Commonly known as: KEFLEX ?Take 1 capsule (500 mg total) by mouth 3 (three) times daily for 4 days. ?  ?cyclobenzaprine 10 MG tablet ?Commonly known as: FLEXERIL ?Take 1 tablet (10 mg total) by mouth at bedtime as needed for muscle spasms. ?What changed: when to take this ?  ?morphine 15 MG 12 hr tablet ?Commonly known as: MS CONTIN ?Take 1 tablet (15 mg total) by mouth every 12 (twelve) hours. ?What changed:  ?medication strength ?how much to take ?when to take this ?  ?pregabalin 300 MG capsule ?Commonly known as: LYRICA ?Take 300 mg by mouth in the morning and at bedtime. ?  ? ?  ? ?  ?  ? ? ?  ?Durable Medical Equipment  ?(From admission, onward)  ?  ? ? ?  ? ?  Start     Ordered  ? 07/03/21 0803  For home use only DME Walker rolling  Once       ?Comments: 5 wheel  ?Question Answer Comment  ?Walker: With 5 Inch Wheels   ?Patient needs a walker to treat with the following condition Weakness   ?  ? 07/03/21 0802  ? 07/02/21 1324  For home use only DME Walker rolling  Once       ?Question Answer Comment  ?Walker: With 5 Inch Wheels   ?Patient needs a walker to treat with the following condition Weakness   ?  ? 07/02/21 1324  ? 07/02/21 1324  For home use only DME Other see comment  Once       ?Comments: Shower chair with back  ?Question:  Length of Need  Answer:  Lifetime  ? 07/02/21 1324  ? ?  ?  ? ?  ? ? ? Follow-up Information   ? ? Jackie Plum, MD. Schedule an appointment as soon as possible for a visit in 1 week(s).   ?Specialty: Internal Medicine ?Contact information: ?[address] ?[address] ?[place] [address] ?[phone] ? ? ?  ?  ? ?  ?  ? ?  ? ? ?Major procedures and Radiology Reports - PLEASE review  detailed and final reports thoroughly  -    ? ?  ?CT HEAD WO CONTRAST ? ?Result Date: 06/30/2021 ?CLINICAL DATA:  66 year old female with altered mental status. EXAM: CT HEAD WITHOUT CONTRAST TECHNIQUE: Contiguous axial im

## 2021-07-03 NOTE — Progress Notes (Signed)
Physical Therapy Treatment ?Patient Details ?Name: Anita Price ?MRN: [id number] ?DOB: [birth year] ?Today's Date: 07/03/2021 ? ? ?History of Present Illness 66 y.o. female adm 06/30/21 with medical history significant of  DJD of multiple sites, chronic back pain, history of back surgery, chronic hepatitis C, tobacco abuse, HTN who presented to ED with AMS and decreased responsiveness Per chart question of drug overdose versus withdrawal.   Head CT was unremarkable and so was her CT scan abdomen pelvis. ? ?  ?PT Comments  ? ? Patient has decided to go stay with her daughter on discharge today. She will not have to climb one flight of steps to get to her apartment this way. She was able to walk 180 ft with RW and minguard assist. Daughter reports RW was already delivered to her home.  ?   ?Recommendations for follow up therapy are one component of a multi-disciplinary discharge planning process, led by the attending physician.  Recommendations may be updated based on patient status, additional functional criteria and insurance authorization. ? ?Follow Up Recommendations ? Home health PT ?  ?  ?Assistance Recommended at Discharge Intermittent Supervision/Assistance  ?Patient can return home with the following A little help with bathing/dressing/bathroom;Assistance with cooking/housework;Direct supervision/assist for medications management;Direct supervision/assist for financial management;Help with stairs or ramp for entrance ?  ?Equipment Recommendations ? Rolling walker (2 wheels)  ?  ?Recommendations for Other Services   ? ? ?  ?Precautions / Restrictions Precautions ?Precautions: Fall ?Precaution Comments: denies h/o falls ?Restrictions ?Weight Bearing Restrictions: No  ?  ? ?Mobility ? Bed Mobility ?Overal bed mobility: Modified Independent ?  ?  ?  ?  ?  ?  ?General bed mobility comments: slow, no rails ?  ? ?Transfers ?Overall transfer level: Needs assistance ?Equipment used: Rolling walker (2 wheels) ?Transfers: Sit  to/from Stand ?Sit to Stand: Min guard ?  ?  ?  ?  ?  ?General transfer comment: vc for hand placement with RW ?  ? ?Ambulation/Gait ?Ambulation/Gait assistance: Min guard ?Gait Distance (Feet): 180 Feet ?Assistive device: Rolling walker (2 wheels) ?Gait Pattern/deviations: Step-through pattern, Decreased stride length ?Gait velocity: very slow however this is her baseline ?  ?  ?General Gait Details: vc for proper use of RW ? ? ?Stairs ?Stairs:  (deferred as pt now going to her daughter's home) ?  ?  ?  ?  ? ? ?Wheelchair Mobility ?  ? ?Modified Rankin (Stroke Patients Only) ?  ? ? ?  ?Balance Overall balance assessment: Needs assistance ?Sitting-balance support: No upper extremity supported, Feet supported ?Sitting balance-Leahy Scale: Fair ?  ?  ?Standing balance support: No upper extremity supported, During functional activity ?Standing balance-Leahy Scale: Fair ?Standing balance comment: able to pull gown up without UE support prior to toileting ?  ?  ?  ?  ?  ?  ?  ?  ?  ?  ?  ?  ? ?  ?Cognition Arousal/Alertness: Awake/alert ?Behavior During Therapy: Flat affect ?Overall Cognitive Status: Impaired/Different from baseline ?Area of Impairment: Problem solving ?  ?  ?  ?  ?  ?  ?  ?  ?  ?  ?  ?  ?  ?  ?Problem Solving: Slow processing, Requires verbal cues ?General Comments: follows commands consistently, slower response time ?  ?  ? ?  ?Exercises   ? ?  ?General Comments General comments (skin integrity, edema, etc.): Daughter present and reports pt will come stay with her temporarily ?  ?  ? ?  Pertinent Vitals/Pain Pain Assessment ?Pain Assessment: Faces ?Faces Pain Scale: Hurts little more ?Pain Location: low back, rt buttock ?Pain Descriptors / Indicators: Discomfort, Grimacing ?Pain Intervention(s): Monitored during session  ? ? ?Home Living   ?  ?  ?  ?  ?  ?  ?  ?  ?  ?   ?  ?Prior Function    ?  ?  ?   ? ?PT Goals (current goals can now be found in the care plan section) Acute Rehab PT Goals ?Patient  Stated Goal: feel better and go home ?PT Goal Formulation: With patient ?Time For Goal Achievement: 07/16/21 ?Potential to Achieve Goals: Good ?Progress towards PT goals: Progressing toward goals ? ?  ?Frequency ? ? ? Min 3X/week ? ? ? ?  ?PT Plan Current plan remains appropriate  ? ? ?Co-evaluation   ?  ?  ?  ?  ? ?  ?AM-PAC PT "6 Clicks" Mobility   ?Outcome Measure ? Help needed turning from your back to your side while in a flat bed without using bedrails?: None ?Help needed moving from lying on your back to sitting on the side of a flat bed without using bedrails?: None ?Help needed moving to and from a bed to a chair (including a wheelchair)?: A Little ?Help needed standing up from a chair using your arms (e.g., wheelchair or bedside chair)?: A Little ?Help needed to walk in hospital room?: A Little ?Help needed climbing 3-5 steps with a railing? : A Little ?6 Click Score: 20 ? ?  ?End of Session   ?Activity Tolerance: Patient tolerated treatment well ?Patient left: with call bell/phone within reach;in bed;with bed alarm set ?Nurse Communication: Mobility status ?PT Visit Diagnosis: Unsteadiness on feet (R26.81);Difficulty in walking, not elsewhere classified (R26.2) ?  ? ? ?Time: 7943-2761 ?PT Time Calculation (min) (ACUTE ONLY): 19 min ? ?Charges:  $Gait Training: 8-22 mins          ?          ? ? ?Jerolyn Center, PT ?Acute Rehabilitation Services  ?Pager [phone] ?Office [phone] ? ? ? ?Scherrie November Uziel Covault ?07/03/2021, 9:10 AM ? ?

## 2021-07-03 NOTE — TOC Transition Note (Signed)
Transition of Care (TOC) - CM/SW Discharge Note ? ? ?Patient Details  ?Name: Anita Price ?MRN: [id number] ?Date of Birth: 20-Jan-1956 ? ?Transition of Care (TOC) CM/SW Contact:  ?Lawerance Sabal, RN ?Phone Number: ?07/03/2021, 9:07 AM ? ? ?Clinical Narrative:    ?Patient scheduled for DC today. ?Notified [employer] of DC. ?DME ordered yesterday. ?No other TOC needs identified.  ? ? ? ?Final next level of care: Home w Home Health Services ?Barriers to Discharge: No Barriers Identified ? ? ?Patient Goals and CMS Choice ?Patient states their goals for this hospitalization and ongoing recovery are:: go stay with daughter ?CMS Medicare.gov Compare Post Acute Care list provided to:: Patient ?Choice offered to / list presented to : Patient ? ?Discharge Placement ?  ?           ?  ?  ?  ?  ? ?Discharge Plan and Services ?  ?Discharge Planning Services: CM Consult ?Post Acute Care Choice: Durable Medical Equipment, Home Health          ?DME Arranged: Other see comment, Walker ?DME Agency: [employer] ?Date DME Agency Contacted: 07/02/21 ?Time DME Agency Contacted: 1334 ?Representative spoke with at DME Agency: Leavy Cella ?HH Arranged: PT ?HH Agency: [facility] ?Date HH Agency Contacted: 07/03/21 ?Time HH Agency Contacted: [phone] ?Representative spoke with at [facility] Agency: Kandee Keen ? ?Social Determinants of Health (SDOH) Interventions ?  ? ? ?Readmission Risk Interventions ? ?  07/13/2020  ? 11:39 AM  ?Readmission Risk Prevention Plan  ?Post Dischage Appt Not Complete  ?Appt Comments attempted to call PCP to schedule f/u- however office informed us that patient would need to call for appointment  ?Medication Screening Complete  ?Transportation Screening Complete  ? ? ? ? ? ?

## 2021-07-03 NOTE — Discharge Instructions (Signed)
Follow with Primary MD Benito Mccreedy, MD in 7 days  ? ?Get CBC, CMP, 2 view Chest X ray -  checked next visit within 1 week by Primary MD   ? ?Activity: As tolerated with Full fall precautions use walker/cane & assistance as needed ? ?Disposition Home   ? ?Diet: Heart Healthy   ? ?Special Instructions: If you have smoked or chewed Tobacco  in the last 2 yrs please stop smoking, stop any regular Alcohol  and or any Recreational drug use. ? ?On your next visit with your primary care physician please Get Medicines reviewed and adjusted. ? ?Please request your Prim.MD to go over all Hospital Tests and Procedure/Radiological results at the follow up, please get all Hospital records sent to your Prim MD by signing hospital release before you go home. ? ?If you experience worsening of your admission symptoms, develop shortness of breath, life threatening emergency, suicidal or homicidal thoughts you must seek medical attention immediately by calling 911 or calling your MD immediately  if symptoms less severe. ? ?You Must read complete instructions/literature along with all the possible adverse reactions/side effects for all the Medicines you take and that have been prescribed to you. Take any new Medicines after you have completely understood and accpet all the possible adverse reactions/side effects.  ? ?  ?

## 2021-07-03 NOTE — Plan of Care (Signed)
?  Problem: Health Behavior/Discharge Planning: ?Goal: Ability to manage health-related needs will improve ?Outcome: Progressing ?  ?Problem: Clinical Measurements: ?Goal: Diagnostic test results will improve ?Outcome: Progressing ?  ?Problem: Pain Managment: ?Goal: General experience of comfort will improve ?Outcome: Progressing ?  ?Problem: Safety: ?Goal: Ability to remain free from injury will improve ?Outcome: Progressing ?  ?

## 2021-07-03 NOTE — Progress Notes (Signed)
Speech Language Pathology Treatment: Dysphagia  ?Patient Details ?Name: Anita Price ?MRN: [id number] ?DOB: 1955/12/06 ?Today's Date: 07/03/2021 ?Time: 6861-6837 ?SLP Time Calculation (min) (ACUTE ONLY): 15 min ? ?Assessment / Plan / Recommendation ?Clinical Impression ? Pt's mentation appears to have returned to baseline and she is much more alert than described in initial SLP evaluation. She says that she has had no difficulties with swallowing while on current mechanical soft diet. No evidence of dysphagia is observed during PO trials with SLP this morning. She says that she is planning to d/c with her daughter today. Recommend that she resume her typical baseline diet up to regular solids upon return home. SLP offered to advance diet to regular solids as she may have at least one more meal in the hospital, but she prefers to leave things as they are while she is here. SLP will sign off acutely.  ?  ?HPI HPI: 66 y.o. female with medical history significant of  DJD of multiple sites, chronic back pain, history of back surgery, chronic hepatitis C, tobacco abuse, HTN who presented to ED with AMS and decreased responsiveness Per chart question of drug overdose versus withdrawal.   Head CT was unremarkable and so was her CT scan abdomen pelvis. ?  ?   ?SLP Plan ? All goals met ? ?  ?  ?Recommendations for follow up therapy are one component of a multi-disciplinary discharge planning process, led by the attending physician.  Recommendations may be updated based on patient status, additional functional criteria and insurance authorization. ?  ? ?Recommendations  ?Diet recommendations: Dysphagia 3 (mechanical soft);Thin liquid ?Liquids provided via: Cup;Straw ?Medication Administration: Whole meds with puree ?Supervision: Patient able to self feed ?Compensations: Slow rate;Small sips/bites ?Postural Changes and/or Swallow Maneuvers: Seated upright 90 degrees  ?   ?    ?   ? ? ? ? Oral Care Recommendations: Oral care  BID ?Follow Up Recommendations: No SLP follow up ?Assistance recommended at discharge: Intermittent Supervision/Assistance ?SLP Visit Diagnosis: Dysphagia, unspecified (R13.10) ?Plan: All goals met ? ? ? ? ?  ?  ? ? ?Osie Bond., M.A. CCC-SLP ?Acute Rehabilitation Services ?Pager [phone] ?Office [phone] ? ? ?07/03/2021, 10:47 AM ?

## 2021-07-03 NOTE — Progress Notes (Signed)
Discharge teaching complete. Meds, diet, activity, follow up appointments reviewed and all questions answered. Copy of instructions given to patient and prescriptions sent to pharmacy. Daughter present during education and patient discharged home via wheelchair with daughter.  ?

## 2023-02-13 ENCOUNTER — Inpatient Hospital Stay (HOSPITAL_COMMUNITY): Payer: Medicare Other

## 2023-02-13 ENCOUNTER — Other Ambulatory Visit: Payer: Self-pay

## 2023-02-13 ENCOUNTER — Encounter (HOSPITAL_COMMUNITY): Payer: Self-pay

## 2023-02-13 ENCOUNTER — Inpatient Hospital Stay (HOSPITAL_COMMUNITY)
Admission: EM | Admit: 2023-02-13 | Discharge: 2023-03-06 | DRG: 871 | Disposition: A | Discharge status: Skilled Nursing Facility | Payer: Medicare Other | Attending: Internal Medicine | Admitting: Internal Medicine

## 2023-02-13 ENCOUNTER — Emergency Department (HOSPITAL_COMMUNITY): Payer: Medicare Other

## 2023-02-13 DIAGNOSIS — R9389 Abnormal findings on diagnostic imaging of other specified body structures: Secondary | ICD-10-CM | POA: Diagnosis present

## 2023-02-13 DIAGNOSIS — Z888 Allergy status to other drugs, medicaments and biological substances status: Secondary | ICD-10-CM

## 2023-02-13 DIAGNOSIS — R9431 Abnormal electrocardiogram [ECG] [EKG]: Secondary | ICD-10-CM | POA: Diagnosis not present

## 2023-02-13 DIAGNOSIS — Z515 Encounter for palliative care: Secondary | ICD-10-CM | POA: Diagnosis not present

## 2023-02-13 DIAGNOSIS — I4892 Unspecified atrial flutter: Secondary | ICD-10-CM | POA: Diagnosis present

## 2023-02-13 DIAGNOSIS — A408 Other streptococcal sepsis: Principal | ICD-10-CM | POA: Diagnosis present

## 2023-02-13 DIAGNOSIS — N179 Acute kidney failure, unspecified: Secondary | ICD-10-CM | POA: Diagnosis present

## 2023-02-13 DIAGNOSIS — Z79899 Other long term (current) drug therapy: Secondary | ICD-10-CM

## 2023-02-13 DIAGNOSIS — I2609 Other pulmonary embolism with acute cor pulmonale: Secondary | ICD-10-CM | POA: Diagnosis not present

## 2023-02-13 DIAGNOSIS — E871 Hypo-osmolality and hyponatremia: Secondary | ICD-10-CM | POA: Diagnosis present

## 2023-02-13 DIAGNOSIS — D735 Infarction of spleen: Secondary | ICD-10-CM | POA: Diagnosis present

## 2023-02-13 DIAGNOSIS — A419 Sepsis, unspecified organism: Secondary | ICD-10-CM | POA: Diagnosis not present

## 2023-02-13 DIAGNOSIS — R935 Abnormal findings on diagnostic imaging of other abdominal regions, including retroperitoneum: Secondary | ICD-10-CM | POA: Insufficient documentation

## 2023-02-13 DIAGNOSIS — E872 Acidosis, unspecified: Secondary | ICD-10-CM | POA: Diagnosis present

## 2023-02-13 DIAGNOSIS — R627 Adult failure to thrive: Secondary | ICD-10-CM | POA: Diagnosis present

## 2023-02-13 DIAGNOSIS — E041 Nontoxic single thyroid nodule: Secondary | ICD-10-CM | POA: Diagnosis present

## 2023-02-13 DIAGNOSIS — R6520 Severe sepsis without septic shock: Secondary | ICD-10-CM | POA: Diagnosis present

## 2023-02-13 DIAGNOSIS — I10 Essential (primary) hypertension: Secondary | ICD-10-CM | POA: Diagnosis present

## 2023-02-13 DIAGNOSIS — Z7189 Other specified counseling: Secondary | ICD-10-CM | POA: Diagnosis not present

## 2023-02-13 DIAGNOSIS — I4891 Unspecified atrial fibrillation: Secondary | ICD-10-CM | POA: Diagnosis not present

## 2023-02-13 DIAGNOSIS — G9341 Metabolic encephalopathy: Secondary | ICD-10-CM | POA: Diagnosis present

## 2023-02-13 DIAGNOSIS — R339 Retention of urine, unspecified: Secondary | ICD-10-CM | POA: Diagnosis not present

## 2023-02-13 DIAGNOSIS — R0682 Tachypnea, not elsewhere classified: Secondary | ICD-10-CM | POA: Diagnosis present

## 2023-02-13 DIAGNOSIS — K7460 Unspecified cirrhosis of liver: Secondary | ICD-10-CM | POA: Diagnosis not present

## 2023-02-13 DIAGNOSIS — E440 Moderate protein-calorie malnutrition: Secondary | ICD-10-CM | POA: Diagnosis not present

## 2023-02-13 DIAGNOSIS — Z7901 Long term (current) use of anticoagulants: Secondary | ICD-10-CM

## 2023-02-13 DIAGNOSIS — R633 Feeding difficulties, unspecified: Secondary | ICD-10-CM | POA: Diagnosis present

## 2023-02-13 DIAGNOSIS — B182 Chronic viral hepatitis C: Secondary | ICD-10-CM | POA: Diagnosis not present

## 2023-02-13 DIAGNOSIS — R112 Nausea with vomiting, unspecified: Secondary | ICD-10-CM | POA: Diagnosis present

## 2023-02-13 DIAGNOSIS — R64 Cachexia: Secondary | ICD-10-CM | POA: Diagnosis present

## 2023-02-13 DIAGNOSIS — Z66 Do not resuscitate: Secondary | ICD-10-CM | POA: Diagnosis present

## 2023-02-13 DIAGNOSIS — F1120 Opioid dependence, uncomplicated: Secondary | ICD-10-CM | POA: Diagnosis present

## 2023-02-13 DIAGNOSIS — Z681 Body mass index (BMI) 19 or less, adult: Secondary | ICD-10-CM

## 2023-02-13 DIAGNOSIS — D689 Coagulation defect, unspecified: Secondary | ICD-10-CM | POA: Diagnosis present

## 2023-02-13 DIAGNOSIS — R188 Other ascites: Secondary | ICD-10-CM | POA: Diagnosis present

## 2023-02-13 DIAGNOSIS — R4589 Other symptoms and signs involving emotional state: Secondary | ICD-10-CM

## 2023-02-13 DIAGNOSIS — I2699 Other pulmonary embolism without acute cor pulmonale: Secondary | ICD-10-CM

## 2023-02-13 DIAGNOSIS — Z886 Allergy status to analgesic agent status: Secondary | ICD-10-CM

## 2023-02-13 DIAGNOSIS — K7682 Hepatic encephalopathy: Secondary | ICD-10-CM | POA: Diagnosis present

## 2023-02-13 DIAGNOSIS — R0902 Hypoxemia: Secondary | ICD-10-CM | POA: Diagnosis not present

## 2023-02-13 DIAGNOSIS — F17210 Nicotine dependence, cigarettes, uncomplicated: Secondary | ICD-10-CM | POA: Diagnosis present

## 2023-02-13 DIAGNOSIS — I2693 Single subsegmental pulmonary embolism without acute cor pulmonale: Secondary | ICD-10-CM | POA: Diagnosis present

## 2023-02-13 DIAGNOSIS — D696 Thrombocytopenia, unspecified: Secondary | ICD-10-CM | POA: Insufficient documentation

## 2023-02-13 DIAGNOSIS — E43 Unspecified severe protein-calorie malnutrition: Secondary | ICD-10-CM | POA: Diagnosis present

## 2023-02-13 DIAGNOSIS — R7989 Other specified abnormal findings of blood chemistry: Secondary | ICD-10-CM | POA: Diagnosis present

## 2023-02-13 DIAGNOSIS — G9340 Encephalopathy, unspecified: Secondary | ICD-10-CM | POA: Diagnosis not present

## 2023-02-13 DIAGNOSIS — R278 Other lack of coordination: Secondary | ICD-10-CM | POA: Diagnosis present

## 2023-02-13 DIAGNOSIS — Z720 Tobacco use: Secondary | ICD-10-CM | POA: Diagnosis present

## 2023-02-13 DIAGNOSIS — J189 Pneumonia, unspecified organism: Secondary | ICD-10-CM | POA: Diagnosis present

## 2023-02-13 DIAGNOSIS — R1310 Dysphagia, unspecified: Secondary | ICD-10-CM | POA: Diagnosis present

## 2023-02-13 DIAGNOSIS — R630 Anorexia: Secondary | ICD-10-CM | POA: Diagnosis not present

## 2023-02-13 DIAGNOSIS — I480 Paroxysmal atrial fibrillation: Secondary | ICD-10-CM | POA: Diagnosis present

## 2023-02-13 DIAGNOSIS — J90 Pleural effusion, not elsewhere classified: Secondary | ICD-10-CM | POA: Diagnosis present

## 2023-02-13 DIAGNOSIS — R001 Bradycardia, unspecified: Secondary | ICD-10-CM | POA: Diagnosis not present

## 2023-02-13 DIAGNOSIS — D6959 Other secondary thrombocytopenia: Secondary | ICD-10-CM | POA: Diagnosis present

## 2023-02-13 DIAGNOSIS — E8809 Other disorders of plasma-protein metabolism, not elsewhere classified: Secondary | ICD-10-CM | POA: Diagnosis present

## 2023-02-13 DIAGNOSIS — J869 Pyothorax without fistula: Secondary | ICD-10-CM | POA: Diagnosis present

## 2023-02-13 DIAGNOSIS — E876 Hypokalemia: Secondary | ICD-10-CM | POA: Diagnosis present

## 2023-02-13 DIAGNOSIS — G894 Chronic pain syndrome: Secondary | ICD-10-CM | POA: Diagnosis present

## 2023-02-13 DIAGNOSIS — R161 Splenomegaly, not elsewhere classified: Secondary | ICD-10-CM | POA: Diagnosis present

## 2023-02-13 DIAGNOSIS — Z885 Allergy status to narcotic agent status: Secondary | ICD-10-CM

## 2023-02-13 DIAGNOSIS — K7469 Other cirrhosis of liver: Secondary | ICD-10-CM | POA: Diagnosis present

## 2023-02-13 DIAGNOSIS — R54 Age-related physical debility: Secondary | ICD-10-CM | POA: Diagnosis present

## 2023-02-13 DIAGNOSIS — M1990 Unspecified osteoarthritis, unspecified site: Secondary | ICD-10-CM | POA: Diagnosis present

## 2023-02-13 LAB — LIPASE, BLOOD: Lipase: 26 U/L (ref 11–51)

## 2023-02-13 LAB — CBC WITH DIFFERENTIAL/PLATELET
Abs Immature Granulocytes: 0.21 10*3/uL — ABNORMAL HIGH (ref 0.00–0.07)
Basophils Absolute: 0 10*3/uL (ref 0.0–0.1)
Basophils Relative: 0 %
Eosinophils Absolute: 0 10*3/uL (ref 0.0–0.5)
Eosinophils Relative: 0 %
HCT: 44.5 % (ref 36.0–46.0)
Hemoglobin: 14.6 g/dL (ref 12.0–15.0)
Immature Granulocytes: 2 %
Lymphocytes Relative: 11 %
Lymphs Abs: 1.3 10*3/uL (ref 0.7–4.0)
MCH: 30 pg (ref 26.0–34.0)
MCHC: 32.8 g/dL (ref 30.0–36.0)
MCV: 91.6 fL (ref 80.0–100.0)
Monocytes Absolute: 0.7 10*3/uL (ref 0.1–1.0)
Monocytes Relative: 6 %
Neutro Abs: 9.6 10*3/uL — ABNORMAL HIGH (ref 1.7–7.7)
Neutrophils Relative %: 81 %
Platelets: 114 10*3/uL — ABNORMAL LOW (ref 150–400)
RBC: 4.86 MIL/uL (ref 3.87–5.11)
RDW: 15.1 % (ref 11.5–15.5)
WBC: 11.7 10*3/uL — ABNORMAL HIGH (ref 4.0–10.5)
nRBC: 1.2 % — ABNORMAL HIGH (ref 0.0–0.2)

## 2023-02-13 LAB — I-STAT CG4 LACTIC ACID, ED
Lactic Acid, Venous: 6.7 mmol/L (ref 0.5–1.9)
Lactic Acid, Venous: 4.7 mmol/L (ref 0.5–1.9)
Lactic Acid, Venous: 2.8 mmol/L (ref 0.5–1.9)

## 2023-02-13 LAB — LACTATE DEHYDROGENASE, PLEURAL OR PERITONEAL FLUID: LD, Fluid: >10000 U/L — ABNORMAL HIGH (ref 3–23)

## 2023-02-13 LAB — ETHANOL: Alcohol, Ethyl (B): <10 mg/dL (ref <10)

## 2023-02-13 LAB — GLUCOSE, PLEURAL OR PERITONEAL FLUID: Glucose, Fluid: <20 mg/dL

## 2023-02-13 LAB — MAGNESIUM: Magnesium: 2.2 mg/dL (ref 1.7–2.4)

## 2023-02-13 LAB — PROTIME-INR
INR: 2 — ABNORMAL HIGH (ref 0.8–1.2)
INR: 2.9 — ABNORMAL HIGH (ref 0.8–1.2)
Prothrombin Time: 23 s — ABNORMAL HIGH (ref 11.4–15.2)
Prothrombin Time: 30.7 s — ABNORMAL HIGH (ref 11.4–15.2)

## 2023-02-13 LAB — BODY FLUID CELL COUNT WITH DIFFERENTIAL
Neutrophil Count, Fluid: 100 % — ABNORMAL HIGH (ref 0–25)
Total Nucleated Cell Count, Fluid: 71355 uL — ABNORMAL HIGH (ref 0–1000)

## 2023-02-13 LAB — COMPREHENSIVE METABOLIC PANEL
ALT: 79 U/L — ABNORMAL HIGH (ref 0–44)
AST: 203 U/L — ABNORMAL HIGH (ref 15–41)
Albumin: 2.5 g/dL — ABNORMAL LOW (ref 3.5–5.0)
Alkaline Phosphatase: 163 U/L — ABNORMAL HIGH (ref 38–126)
Anion gap: 17 — ABNORMAL HIGH (ref 5–15)
BUN: 46 mg/dL — ABNORMAL HIGH (ref 8–23)
CO2: 21 mmol/L — ABNORMAL LOW (ref 22–32)
Calcium: 8 mg/dL — ABNORMAL LOW (ref 8.9–10.3)
Chloride: 90 mmol/L — ABNORMAL LOW (ref 98–111)
Creatinine, Ser: 1.64 mg/dL — ABNORMAL HIGH (ref 0.44–1.00)
GFR, Estimated: 34 mL/min — ABNORMAL LOW (ref >60)
Glucose, Bld: 105 mg/dL — ABNORMAL HIGH (ref 70–99)
Potassium: 3.6 mmol/L (ref 3.5–5.1)
Sodium: 128 mmol/L — ABNORMAL LOW (ref 135–145)
Total Bilirubin: 1.5 mg/dL — ABNORMAL HIGH (ref <1.2)
Total Protein: 6.7 g/dL (ref 6.5–8.1)

## 2023-02-13 LAB — BLOOD GAS, VENOUS
Acid-base deficit: 5.2 mmol/L — ABNORMAL HIGH (ref 0.0–2.0)
Bicarbonate: 20.6 mmol/L (ref 20.0–28.0)
O2 Saturation: 34.2 %
Patient temperature: 37
pCO2, Ven: 40 mm[Hg] — ABNORMAL LOW (ref 44–60)
pH, Ven: 7.32 (ref 7.25–7.43)
pO2, Ven: <31 mm[Hg] — CL (ref 32–45)

## 2023-02-13 LAB — SALICYLATE LEVEL: Salicylate Lvl: <7 mg/dL — ABNORMAL LOW (ref 7.0–30.0)

## 2023-02-13 LAB — PROTEIN, PLEURAL OR PERITONEAL FLUID: Total protein, fluid: <3 g/dL

## 2023-02-13 LAB — ACETAMINOPHEN LEVEL: Acetaminophen (Tylenol), Serum: <10 ug/mL — ABNORMAL LOW (ref 10–30)

## 2023-02-13 LAB — AMMONIA: Ammonia: 51 umol/L — ABNORMAL HIGH (ref 9–35)

## 2023-02-13 MED ORDER — VANCOMYCIN HCL IN DEXTROSE 1-5 GM/200ML-% IV SOLN: 1000.0000 mg | Freq: Once | INTRAVENOUS | Status: DC

## 2023-02-13 MED ORDER — METRONIDAZOLE 500 MG/100ML IV SOLN
500.0000 mg | Freq: Once | INTRAVENOUS | Status: AC
  Administered 2023-02-13: 500 mg via INTRAVENOUS
  Filled 2023-02-13: qty 100

## 2023-02-13 MED ORDER — CEFEPIME HCL 2 G IV SOLR
2.0000 g | Freq: Once | INTRAVENOUS | Status: AC
  Administered 2023-02-13: 2 g via INTRAVENOUS
  Filled 2023-02-13: qty 12.5

## 2023-02-13 MED ORDER — MORPHINE SULFATE (PF) 2 MG/ML IV SOLN
1.0000 mg | INTRAVENOUS | Status: AC | PRN
  Administered 2023-02-13: 1 mg via INTRAVENOUS
  Filled 2023-02-13 (×2): qty 1

## 2023-02-13 MED ORDER — PIPERACILLIN-TAZOBACTAM 3.375 G IVPB
3.3750 g | Freq: Three times a day (TID) | INTRAVENOUS | Status: DC
  Administered 2023-02-13 – 2023-02-16 (×8): 3.375 g via INTRAVENOUS
  Filled 2023-02-13 (×8): qty 50

## 2023-02-13 MED ORDER — ALBUMIN HUMAN 25 % IV SOLN
25.0000 g | Freq: Once | INTRAVENOUS | Status: AC
  Administered 2023-02-13: 25 g via INTRAVENOUS
  Filled 2023-02-13: qty 100

## 2023-02-13 MED ORDER — CEFEPIME HCL 2 G IV SOLR: 2.0000 g | Freq: Three times a day (TID) | INTRAVENOUS | Status: DC

## 2023-02-13 MED ORDER — ACETAMINOPHEN 325 MG PO TABS
650.0000 mg | ORAL_TABLET | Freq: Four times a day (QID) | ORAL | Status: DC | PRN
Start: 2023-02-13 — End: 2023-02-19
  Administered 2023-02-18: 650 mg via ORAL
  Filled 2023-02-13: qty 2

## 2023-02-13 MED ORDER — VANCOMYCIN HCL IN DEXTROSE 1-5 GM/200ML-% IV SOLN: 1000.0000 mg | INTRAVENOUS | Status: DC

## 2023-02-13 MED ORDER — PANTOPRAZOLE SODIUM 40 MG IV SOLR
40.0000 mg | Freq: Every day | INTRAVENOUS | Status: DC
  Administered 2023-02-13 – 2023-02-15 (×3): 40 mg via INTRAVENOUS
  Filled 2023-02-13 (×3): qty 10

## 2023-02-13 MED ORDER — PROCHLORPERAZINE EDISYLATE 10 MG/2ML IJ SOLN
5.0000 mg | INTRAMUSCULAR | Status: DC | PRN
  Administered 2023-02-17 – 2023-02-18 (×2): 5 mg via INTRAVENOUS
  Filled 2023-02-13 (×2): qty 2

## 2023-02-13 MED ORDER — LACTATED RINGERS IV SOLN: INTRAVENOUS | Status: DC

## 2023-02-13 MED ORDER — LIDOCAINE HCL 1 % IJ SOLN
INTRAMUSCULAR | Status: AC
  Filled 2023-02-13: qty 20

## 2023-02-13 MED ORDER — IOHEXOL 300 MG/ML  SOLN
60.0000 mL | Freq: Once | INTRAMUSCULAR | Status: AC | PRN
Start: 2023-02-13 — End: 2023-02-13
  Administered 2023-02-13: 60 mL via INTRAVENOUS

## 2023-02-13 MED ORDER — LACTATED RINGERS IV BOLUS
1000.0000 mL | Freq: Once | INTRAVENOUS | Status: AC
Start: 2023-02-13 — End: 2023-02-13
  Administered 2023-02-13: 1000 mL via INTRAVENOUS

## 2023-02-13 MED ORDER — CHLORHEXIDINE GLUCONATE CLOTH 2 % EX PADS
6.0000 | MEDICATED_PAD | Freq: Every day | CUTANEOUS | Status: DC
  Administered 2023-02-15 – 2023-02-28 (×12): 6 via TOPICAL

## 2023-02-13 MED ORDER — ACETAMINOPHEN 650 MG RE SUPP
650.0000 mg | Freq: Four times a day (QID) | RECTAL | Status: DC | PRN
Start: 2023-02-13 — End: 2023-02-19

## 2023-02-13 MED ORDER — LACTATED RINGERS IV BOLUS (SEPSIS)
500.0000 mL | Freq: Once | INTRAVENOUS | Status: AC
Start: 2023-02-13 — End: 2023-02-13
  Administered 2023-02-13: 500 mL via INTRAVENOUS

## 2023-02-13 MED ORDER — SODIUM CHLORIDE 0.9% FLUSH
10.0000 mL | Freq: Two times a day (BID) | INTRAVENOUS | Status: DC
  Administered 2023-02-13 – 2023-02-19 (×12): 10 mL via INTRAVENOUS

## 2023-02-13 MED ORDER — METRONIDAZOLE 500 MG/100ML IV SOLN: 500.0000 mg | Freq: Two times a day (BID) | INTRAVENOUS | Status: DC

## 2023-02-13 MED ORDER — POTASSIUM CHLORIDE 10 MEQ/100ML IV SOLN
10.0000 meq | INTRAVENOUS | Status: AC
  Administered 2023-02-13 (×2): 10 meq via INTRAVENOUS
  Filled 2023-02-13 (×2): qty 100

## 2023-02-13 MED ORDER — VANCOMYCIN HCL IN DEXTROSE 1-5 GM/200ML-% IV SOLN
1000.0000 mg | Freq: Once | INTRAVENOUS | Status: AC
  Administered 2023-02-13: 1000 mg via INTRAVENOUS
  Filled 2023-02-13: qty 200

## 2023-02-13 NOTE — Progress Notes (Signed)
A consult was received from an ED physician for cefepime and vancomycin per pharmacy dosing for infection of unknown source.  The patient's profile has been reviewed for ht/wt/allergies/indication/available labs.    Agree with orders already placed by provider for cefepime 2 g IV and vancomycin 1000 mg IV x 1.    Further antibiotics/pharmacy consults should be ordered by admitting physician if indicated.                       Thank you, Lynden Ang, PharmD, BCPS 02/13/2023  10:58 AM

## 2023-02-13 NOTE — Progress Notes (Signed)
eLink Physician-Brief Progress Note Patient Name: Anita Price DOB: 1955/05/26 MRN: [id number]   Date of Service  02/13/2023  HPI/Events of Note  Patient with advanced liver disease admitted with failure to thrive, a large pleural effusion, and altered mental status. Work up is in progress.   eICU Interventions  New Patient Evaluation.        Thomasene Lot [name] 02/13/2023, 9:23 PM

## 2023-02-13 NOTE — ED Notes (Signed)
ED TO INPATIENT HANDOFF REPORT  ED Nurse Name and Phone #:  Breck Coons Name/Age/Gender Anita Price 67 y.o. female Room/Bed: WA24/WA24  Code Status   Code Status: Full Code  Home/SNF/Other Home Patient oriented to: self Is this baseline? Yes   Triage Complete: Triage complete  Chief Complaint Sepsis due to undetermined organism [facility]) [A41.9]  Triage Note EMS reports from home, family states weakness and nausea vomiting x 3-4 days.  BP 110/90 HR 110 RR 16 Sp02 96 RA CBG 171    Allergies Allergies  Allergen Reactions   Celecoxib Other (See Comments)    Insomnia   Duloxetine Hcl Other (See Comments)    Insomnia   Tizanidine Nausea And Vomiting   Venlafaxine Other (See Comments)    Hallucinations   Effersyllium [Psyllium] Other (See Comments)    Made the patient "feel badly"   Escitalopram Other (See Comments)    insomnia   Neurontin [Gabapentin] Other (See Comments)    Made the patient "feel badly"   Tramadol Other (See Comments)    Made the patient "feel badly"    Level of Care/Admitting Diagnosis ED Disposition     ED Disposition  Admit   Condition  --   Comment  Hospital Area: [facility] [[id number]]  Level of Care: Stepdown [14]  Admit to SDU based on following criteria: Respiratory Distress:  Frequent assessment and/or intervention to maintain adequate ventilation/respiration, pulmonary toilet, and respiratory treatment.  May admit patient to [facility] or [facility] if equivalent level of care is available:: No  Covid Evaluation: Asymptomatic - no recent exposure (last 10 days) testing not required  Diagnosis: Sepsis due to undetermined organism [facility]) [[id number]]  Admitting Physician: Bobette Mo [[id number]]  Attending Physician: Bobette Mo [[id number]]  Certification:: I certify this patient will need inpatient services for at least 2 midnights  Expected Medical Readiness: 02/15/2023           B Medical/Surgery History Past Medical History:  Diagnosis Date   Acute metabolic encephalopathy    Chronic back pain    DJD (degenerative joint disease)    Essential hypertension 07/10/2020   Gallbladder sludge    Hepatitis C 07/10/2020   Liver cirrhosis (HCC)    Pain management    Prolonged QT interval    Protein calorie malnutrition (HCC)    Thyroid nodule    Tobacco use 07/10/2020   Past Surgical History:  Procedure Laterality Date   BACK SURGERY     btl     HERNIA REPAIR     WRIST SURGERY       A IV Location/Drains/Wounds Patient Lines/Drains/Airways Status     Active Line/Drains/Airways     Name Placement date Placement time Site Days   Peripheral IV 02/13/23 20 G Anterior;Left Hand 02/13/23  1018  Hand  less than 1            Intake/Output Last 24 hours No intake or output data in the 24 hours ending 02/13/23 1910  Labs/Imaging Results for orders placed or performed during the hospital encounter of 02/13/23 (from the past 48 hour(s))  Acetaminophen level     Status: Abnormal   Collection Time: 02/13/23 10:22 AM  Result Value Ref Range   Acetaminophen (Tylenol), Serum <10 (L) 10 - 30 ug/mL    Comment: (NOTE) Therapeutic concentrations vary significantly. A range of 10-30 ug/mL  may be an effective concentration for many patients. However, some  are best treated at concentrations  outside of this range. Acetaminophen concentrations >150 ug/mL at 4 hours after ingestion  and >50 ug/mL at 12 hours after ingestion are often associated with  toxic reactions.  Performed at [facility], [address] [place], [place] [address]   Comprehensive metabolic panel     Status: Abnormal   Collection Time: 02/13/23 10:22 AM  Result Value Ref Range   Sodium 128 (L) 135 - 145 mmol/L   Potassium 3.6 3.5 - 5.1 mmol/L   Chloride 90 (L) 98 - 111 mmol/L   CO2 21 (L) 22 - 32 mmol/L   Glucose, Bld 105 (H) 70 - 99 mg/dL    Comment: Glucose  reference range applies only to samples taken after fasting for at least 8 hours.   BUN 46 (H) 8 - 23 mg/dL   Creatinine, Ser 6.04 (H) 0.44 - 1.00 mg/dL   Calcium 8.0 (L) 8.9 - 10.3 mg/dL   Total Protein 6.7 6.5 - 8.1 g/dL   Albumin 2.5 (L) 3.5 - 5.0 g/dL   AST 540 (H) 15 - 41 U/L   ALT 79 (H) 0 - 44 U/L   Alkaline Phosphatase 163 (H) 38 - 126 U/L   Total Bilirubin 1.5 (H) <1.2 mg/dL   GFR, Estimated 34 (L) >60 mL/min    Comment: (NOTE) Calculated using the CKD-EPI Creatinine Equation (2021)    Anion gap 17 (H) 5 - 15    Comment: Performed at [facility], [address], [place], [place] [address]  Ethanol     Status: None   Collection Time: 02/13/23 10:22 AM  Result Value Ref Range   Alcohol, Ethyl (B) <10 <10 mg/dL    Comment: (NOTE) Lowest detectable limit for serum alcohol is 10 mg/dL.  For medical purposes only. Performed at [facility], [address] [place], [place] [address]   Lipase, blood     Status: None   Collection Time: 02/13/23 10:22 AM  Result Value Ref Range   Lipase 26 11 - 51 U/L    Comment: Performed at [facility], [address] [place], [place] [address]  Salicylate level     Status: Abnormal   Collection Time: 02/13/23 10:22 AM  Result Value Ref Range   Salicylate Lvl <7.0 (L) 7.0 - 30.0 mg/dL    Comment: HEMOLYSIS AT THIS LEVEL MAY AFFECT RESULT Performed at [facility], [address] [place], [place] [address]   CBC with Differential     Status: Abnormal   Collection Time: 02/13/23 10:22 AM  Result Value Ref Range   WBC 11.7 (H) 4.0 - 10.5 K/uL   RBC 4.86 3.87 - 5.11 MIL/uL   Hemoglobin 14.6 12.0 - 15.0 g/dL   HCT 78.4 69.6 - 29.5 %   MCV 91.6 80.0 - 100.0 fL   MCH 30.0 26.0 - 34.0 pg   MCHC 32.8 30.0 - 36.0 g/dL   RDW 28.4 13.2 - 44.0 %   Platelets 114 (L) 150 - 400 K/uL   nRBC 1.2 (H) 0.0 - 0.2 %   Neutrophils Relative % 81 %   Neutro Abs 9.6 (H) 1.7 -  7.7 K/uL   Lymphocytes Relative 11 %   Lymphs Abs 1.3 0.7 - 4.0 K/uL   Monocytes Relative 6 %   Monocytes Absolute 0.7 0.1 - 1.0 K/uL   Eosinophils Relative 0 %   Eosinophils Absolute 0.0 0.0 - 0.5 K/uL   Basophils Relative 0 %   Basophils  Absolute 0.0 0.0 - 0.1 K/uL   WBC Morphology TOXIC GRANULATION    Immature Granulocytes 2 %   Abs Immature Granulocytes 0.21 (H) 0.00 - 0.07 K/uL    Comment: Performed at [facility], [address] [place], [place] [address]  Protime-INR     Status: Abnormal   Collection Time: 02/13/23 10:22 AM  Result Value Ref Range   Prothrombin Time 23.0 (H) 11.4 - 15.2 seconds   INR 2.0 (H) 0.8 - 1.2    Comment: (NOTE) INR goal varies based on device and disease states. Performed at [facility], [address], [place], [place] [address]   Ammonia     Status: Abnormal   Collection Time: 02/13/23 10:22 AM  Result Value Ref Range   Ammonia 51 (H) 9 - 35 umol/L    Comment: HEMOLYSIS AT THIS LEVEL MAY AFFECT RESULT Performed at [facility], [address], [place], [place] [address]   Magnesium     Status: None   Collection Time: 02/13/23 10:22 AM  Result Value Ref Range   Magnesium 2.2 1.7 - 2.4 mg/dL    Comment: Performed at [facility], [address], [place], [place] [address]  I-Stat CG4 Lactic Acid     Status: Abnormal   Collection Time: 02/13/23 10:29 AM  Result Value Ref Range   Lactic Acid, Venous 6.7 (HH) 0.5 - 1.9 mmol/L   Comment NOTIFIED PHYSICIAN   Blood gas, venous (at [facility])     Status: Abnormal   Collection Time: 02/13/23 11:08 AM  Result Value Ref Range   pH, Ven 7.32 7.25 - 7.43   pCO2, Ven 40 (L) 44 - 60 mmHg   pO2, Ven <31 (LL) 32 - 45 mmHg    Comment: CRITICAL RESULT CALLED TO, READ BACK BY AND VERIFIED WITH: C. KAISER, RN 02/13/2023 @ 1130 SL    Bicarbonate 20.6 20.0 - 28.0 mmol/L   Acid-base deficit 5.2 (H) 0.0 - 2.0 mmol/L   O2  Saturation 34.2 %   Patient temperature 37.0     Comment: Performed at [facility], [address], [place], [place] [address]  I-Stat CG4 Lactic Acid     Status: Abnormal   Collection Time: 02/13/23  1:10 PM  Result Value Ref Range   Lactic Acid, Venous 4.7 (HH) 0.5 - 1.9 mmol/L   Comment NOTIFIED PHYSICIAN   Protime-INR     Status: Abnormal   Collection Time: 02/13/23  5:46 PM  Result Value Ref Range   Prothrombin Time 30.7 (H) 11.4 - 15.2 seconds   INR 2.9 (H) 0.8 - 1.2    Comment: (NOTE) INR goal varies based on device and disease states. Performed at [facility], [address] [place], [place] [address]   I-Stat CG4 Lactic Acid     Status: Abnormal   Collection Time: 02/13/23  5:54 PM  Result Value Ref Range   Lactic Acid, Venous 2.8 (HH) 0.5 - 1.9 mmol/L   Comment NOTIFIED PHYSICIAN    US THORACENTESIS ASP PLEURAL SPACE W/IMG GUIDE  Result Date: 02/13/2023 INDICATION: Patient with history of cirrhosis, hepatitis-C, encephalopathy, weakness, loculated left pleural effusion; request received for diagnostic and therapeutic left thoracentesis. EXAM: ULTRASOUND GUIDED DIAGNOSTIC AND THERAPEUTIC LEFT THORACENTESIS MEDICATIONS: 8 mL 1% lidocaine COMPLICATIONS: None immediate. PROCEDURE: An ultrasound guided thoracentesis was thoroughly discussed with the patient/daughter and questions answered. The benefits, risks, alternatives and complications were also discussed. The patient/daughter  understands and wishes to proceed with the procedure. Written consent was obtained. Ultrasound was performed to localize and mark an adequate pocket of fluid in the left chest. The area was then prepped and draped in the normal sterile fashion. 1% Lidocaine was used for local anesthesia. Under ultrasound guidance a 6 Fr Safe-T-Centesis catheter was introduced. Thoracentesis was performed. The catheter was removed and a dressing applied. FINDINGS: A total of  approximately 1 liter of purulent, cream colored/milky fluid was removed. Samples were sent to the laboratory as requested by the clinical team. IMPRESSION: Successful ultrasound guided diagnostic and therapeutic left thoracentesis yielding 1 liter of pleural fluid. Performed by: Artemio Aly Electronically Signed   By: Marliss Coots M.D.   On: 02/13/2023 16:38   CT CHEST ABDOMEN PELVIS W CONTRAST  Result Date: 02/13/2023 CLINICAL DATA:  Sepsis weakness, nausea and vomiting for 3-4 days. EXAM: CT CHEST, ABDOMEN, AND PELVIS WITH CONTRAST TECHNIQUE: Multidetector CT imaging of the chest, abdomen and pelvis was performed following the standard protocol during bolus administration of intravenous contrast. RADIATION DOSE REDUCTION: This exam was performed according to the departmental dose-optimization program which includes automated exposure control, adjustment of the mA and/or kV according to patient size and/or use of iterative reconstruction technique. CONTRAST:  60mL OMNIPAQUE IOHEXOL 300 MG/ML  SOLN COMPARISON:  CT scan abdomen and pelvis from 07/01/2021 and CT scan chest from 07/10/2020 FINDINGS: CT CHEST FINDINGS Cardiovascular: Normal cardiac size. No pericardial effusion. No aortic aneurysm. Mediastinum/Nodes: Redemonstration of an exophytic heterogeneous centrally hypoattenuating and peripherally hyperattenuating walled nodule arising from the inferior aspect of the left thyroid lobe measuring 1.7 x 1.8 cm. This is incompletely characterized on the current examination but appears unchanged since the prior study from 2022. Please refer to ultrasound thyroid report from April 2022 for additional details. No solid / cystic mediastinal masses. The esophagus is nondistended precluding optimal assessment. No axillary, mediastinal or hilar lymphadenopathy by size criteria. Lungs/Pleura: The trachea and right bronchial tree is patent. There is complete opacification of left bronchial tree with soft tissue  attenuation areas and multiple small foci of air. There is large left pleural effusion with sharp margins, favoring loculated pleural effusion. There is mild smooth thickening of the costal pleura. There also 2 small patches of loculated pleural effusion along the medial aspect. Findings favor chronic loculated pleural effusion than empyema. There is associated complete collapse of the left lower lobe and lingular segments of left upper lobe. There is partial aeration of the remaining left upper lobe. No suspicious mass or abscess seen within the collapsed lung. There are dependent changes in the right lung. Minimal centrilobular emphysematous changes also noted in the right lung. Right lung is otherwise clear. No mass, consolidation or pleural effusion. Musculoskeletal: The visualized soft tissues of the chest wall are grossly unremarkable. No suspicious osseous lesions. There are mild multilevel degenerative changes in the visualized spine. CT ABDOMEN PELVIS FINDINGS Hepatobiliary: The liver is normal in size. There is liver surface irregularity/nodularity, compatible with cirrhosis. No discrete suspicious lesion seen on this single phase exam. There are at least 2, subcentimeter, hypoattenuating foci in the right hepatic lobe, which are too small to adequately characterize. No intrahepatic or extrahepatic bile duct dilation. No calcified gallstones. Normal gallbladder wall thickness. No pericholecystic inflammatory changes. Pancreas: Unremarkable. No pancreatic ductal dilatation or surrounding inflammatory changes. Spleen: There are at least 3, predominantly peripheral wedge-shaped hypoattenuating areas within the spleen, compatible with splenic infarctions. There is an additional subcentimeter sized hyperattenuating focus along  the inferior tip, which is incompletely characterized on the current exam but favored to represent a hemangioma. The spleen is mildly enlarged measuring up to 12.9 cm in length, decreased  in size since the prior study from 2022, when it measured up to 14.2 cm. Adrenals/Urinary Tract: Adrenal glands are unremarkable. No suspicious renal mass. No hydronephrosis. No renal or ureteric calculi. Unremarkable urinary bladder. Stomach/Bowel: No disproportionate dilation of the small or large bowel loops. No evidence of abnormal bowel wall thickening or inflammatory changes. The appendix is unremarkable. Vascular/Lymphatic: No ascites or pneumoperitoneum. No abdominal or pelvic lymphadenopathy, by size criteria. No aneurysmal dilation of the major abdominal arteries. There are mild peripheral atherosclerotic vascular calcifications of the aorta and its major branches. There are multiple venous collaterals near the fundus/cardia of the stomach, compatible with sequela of portal hypertension. Reproductive: There is small anteverted uterus. However, there is new endometrial thickening measuring up to 10-11 mm. This is abnormal for the patient of this age group. Clinical correlation and further evaluation with nonemergent pelvic ultrasound is recommended. No large adnexal mass seen. Bilateral fallopian tube closure device noted. Other: There are bilateral small fat containing inguinal hernias. The soft tissues and abdominal wall are otherwise unremarkable. Musculoskeletal: No suspicious osseous lesions. There are mild - moderate multilevel degenerative changes in the visualized spine. Posterior spinal fixation of L4-5 noted with transpedicular screws and rods. IMPRESSION: 1. There is large loculated left pleural effusion with associated complete collapse of the left lung lower lobe and lingular segment. 2. 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 3. Normal-size anteverted uterus however, there is new thickening of the endometrium measuring up to 10-11 mm. Clinical correlation and further evaluation with nonemergent pelvic ultrasound is recommended. 4. Cirrhotic liver configuration with mild splenomegaly and multiple  venous collaterals near the fundus/cardia of the stomach. No ascites. 5. Multiple other nonacute observations, as described above. Electronically Signed   By: Jules Schick M.D.   On: 02/13/2023 13:18   CT Head Wo Contrast  Result Date: 02/13/2023 CLINICAL DATA:  Mental status change.  Weakness. EXAM: CT HEAD WITHOUT CONTRAST TECHNIQUE: Contiguous axial images were obtained from the base of the skull through the vertex without intravenous contrast. RADIATION DOSE REDUCTION: This exam was performed according to the departmental dose-optimization program which includes automated exposure control, adjustment of the mA and/or kV according to patient size and/or use of iterative reconstruction technique. COMPARISON:  CT scan head from 06/30/2021. FINDINGS: Brain: No evidence of acute infarction, hemorrhage, hydrocephalus, extra-axial collection or mass lesion/mass effect. Ventricles are normal. Cerebral volume is age appropriate. Vascular: No hyperdense vessel or unexpected calcification. Skull: Normal. Negative for fracture or focal lesion. Sinuses/Orbits: No acute finding. Other: Visualized mastoid air cells are unremarkable. No mastoid effusion. IMPRESSION: *No acute intracranial abnormality. Electronically Signed   By: Jules Schick M.D.   On: 02/13/2023 12:57   DG Chest Port 1 View  Result Date: 02/13/2023 CLINICAL DATA:  Altered mental status. EXAM: PORTABLE CHEST 1 VIEW COMPARISON:  07/01/2021. FINDINGS: There is near complete opacification of left hemithorax without significant mediastinal shift. There is aeration of portion of the left upper mid lung zones. Findings may represent combination of left lung atelectasis and/or consolidation with probable loculated large pleural effusion. Right lung fields and right lateral costophrenic angle are clear. No pneumothorax on either side. Evaluation of cardiomediastinal silhouette is nondiagnostic due to left hemithorax opacification. No acute osseous  abnormalities. The soft tissues are within normal limits. IMPRESSION: *Near complete opacification of left hemithorax,  as discussed above. Electronically Signed   By: Jules Schick M.D.   On: 02/13/2023 12:54    Pending Labs Unresulted Labs (From admission, onward)     Start     Ordered   02/14/23 0500  CBC with Differential  Daily,   R      02/13/23 1733   02/14/23 0500  Comprehensive metabolic panel  Tomorrow morning,   R        02/13/23 1733   02/14/23 0500  HIV Antibody (routine testing w rflx)  (HIV Antibody (Routine testing w reflex) panel)  Tomorrow morning,   R        02/13/23 1735   02/14/23 0500  CBC  Tomorrow morning,   R        02/13/23 1735   02/14/23 0500  Ammonia  Tomorrow morning,   R        02/13/23 1749   02/14/23 0500  Magnesium  Tomorrow morning,   R        02/13/23 1803   02/14/23 0500  Phosphorus  Tomorrow morning,   R        02/13/23 1803   02/13/23 1629  Lactate dehydrogenase (pleural or peritoneal fluid)  RELEASE UPON ORDERING,   STAT        02/13/23 1629   02/13/23 1629  Body fluid cell count with differential  RELEASE UPON ORDERING,   STAT        02/13/23 1629   02/13/23 1629  FISH Oncology  RELEASE UPON ORDERING,   TIMED        02/13/23 1629   02/13/23 1610  Protein, pleural or peritoneal fluid  Once,   STAT        02/13/23 1609   02/13/23 1610  Glucose, pleural or peritoneal fluid  Once,   STAT        02/13/23 1609   02/13/23 1609  Body fluid culture w Gram Stain  Once,   STAT       Question Answer Comment  Are there also cytology or pathology orders on this specimen? Yes   Patient immune status Normal      02/13/23 1608   02/13/23 1116  Blood culture (routine x 2)  BLOOD CULTURE X 2,   R      02/13/23 1115   02/13/23 1008  Rapid urine drug screen (hospital performed)  ONCE - STAT,   STAT        02/13/23 1007   02/13/23 1006  Urinalysis, Routine w reflex microscopic -Urine, Clean Catch  Once,   URGENT       Question:  Specimen Source  Answer:   Urine, Clean Catch   02/13/23 1006            Vitals/Pain Today's Vitals   02/13/23 1700 02/13/23 1800 02/13/23 1830 02/13/23 1900  BP: (!) 95/50 (!) 90/59 (!) 91/55 126/64  Pulse: (!) 102 (!) 102 100 (!) 102  Resp: 16 16 16 20   Temp:    97.8 F (36.6 C)  TempSrc:    Axillary  SpO2: 98% 96% 95% 92%  Weight: 49.9 kg     Height: 5\' 5"  (1.651 m)     PainSc:        Isolation Precautions No active isolations  Medications Medications  lactated ringers infusion ( Intravenous New Bag/Given 02/13/23 1256)  sodium chloride flush (NS) 0.9 % injection 10 mL (has no administration in time range)  acetaminophen (TYLENOL) tablet 650 mg (has no  administration in time range)    Or  acetaminophen (TYLENOL) suppository 650 mg (has no administration in time range)  prochlorperazine (COMPAZINE) injection 5 mg (has no administration in time range)  pantoprazole (PROTONIX) injection 40 mg (40 mg Intravenous Given 02/13/23 1751)  vancomycin (VANCOCIN) IVPB 1000 mg/200 mL premix (has no administration in time range)  potassium chloride 10 mEq in 100 mL IVPB (has no administration in time range)  piperacillin-tazobactam (ZOSYN) IVPB 3.375 g (has no administration in time range)  lactated ringers bolus 1,000 mL (0 mLs Intravenous Stopped 02/13/23 1109)  lactated ringers bolus 500 mL (0 mLs Intravenous Stopped 02/13/23 1235)  ceFEPIme (MAXIPIME) 2 g in sodium chloride 0.9 % 100 mL IVPB (0 g Intravenous Stopped 02/13/23 1235)  metroNIDAZOLE (FLAGYL) IVPB 500 mg (0 mg Intravenous Stopped 02/13/23 1235)  vancomycin (VANCOCIN) IVPB 1000 mg/200 mL premix (0 mg Intravenous Stopped 02/13/23 1355)  iohexol (OMNIPAQUE) 300 MG/ML solution 60 mL (60 mLs Intravenous Contrast Given 02/13/23 1236)  albumin human 25 % solution 25 g (0 g Intravenous Stopped 02/13/23 1904)    Mobility non-ambulatory     Focused Assessments     R Recommendations: See Admitting Provider Note  Report given to:    Additional Notes:

## 2023-02-13 NOTE — Consult Note (Signed)
NAME:  Anita Price, MRN:  [id number], DOB:  08/18/1955, LOS: 1 ADMISSION DATE:  02/13/2023, CONSULTATION DATE:  02/13/23 REFERRING MD:  Robb Matar - TRH, CHIEF COMPLAINT:  weakness6   History of Present Illness:  67 yo F PMH hep C, cirrhosis, HE, chronic pain, prolonged qtc who presented to [facility] ED 11/14 for 3-4d hx weakness + anorexia, n/v. In ED she was noted to be altered, unable to provide much reliable history. Had a CXR which showed L effusion. Underwent CT H c/a/p -- no intracranial abnormality, large L loculated pleural effusion, new thickening of endometrium, liver cirrhosis, splenomegaly, multiple splenic infarcts. Her labs revealed an AKI, meta cidosis, elevated LFTs, electrolyte disturbances, elevated ammonia, lactic acidosis, thrombocytopenia and coagulopathy. Mild leukocytosis. Started on vanc cefepime flagyl.  Underwent thora with IR, 1L milky purulent fluid off. Sent for cx. CXR post procedure with some residual effusion.  PCCM consulted in this setting, for pulm consult to see 11/15 AM.    In d/w daughter 11/15, this came out of nowhere a few days ago. Usually takes care of nephews and is functioning well w her chronic dz processes. Has had encephalopathy in the past r/t infections. Baseline opioid dependence -- follows w pain clinic in [place] & is considering a SCS trial   Pertinent  Medical History  Chronic pain DJD Prolonged qtc Protein calorie malnutrition Cirrhosis Hep C Hepatic encephalopathy   Significant Hospital Events: Including procedures, antibiotic start and stop dates in addition to other pertinent events   11/14 Admit to [facility]. IR thora.  11/15 pccm for chest tube, empyema LD> 10K, 71K TNC, 100 neutrophils and was turbid appearing    Interim History / Subjective:   Consulted after thora  Pleural fluid w abundant GPCs   Objective   Blood pressure (!) 137/57, pulse 91, temperature 98.7 F (37.1 C), temperature source Axillary, resp. rate (!) 29, height 5'  5" (1.651 m), weight 46.9 kg, SpO2 91%.        Intake/Output Summary (Last 24 hours) at 02/14/2023 0832 Last data filed at 02/14/2023 0618 Gross per 24 hour  Intake 2803.32 ml  Output 350 ml  Net 2453.32 ml   Filed Weights   02/13/23 1700 02/13/23 2030  Weight: 49.9 kg 46.9 kg    Examination: General: Chronically and acutely ill F  HENT: NCAT. Temporal muscle wasting. Pink dry mm  Lungs: symmetrical chest expansion, some rhonchi  Cardiovascular: rr cap reifll < 3 sec  Abdomen: thin  Extremities: decr muscle mass, very little adipose tissue  Neuro: Vocalizes to noxious stim. Clenches eyelids shut -- limited assessment  Skin: wet, urine covered   Resolved Hospital Problem list     Assessment & Plan:   L loculated pleural effusion s/p thora (IR, 11/14) -- empyema  Possible PNA  sepsis 2/2 above  Acute encephalopathy 2/2 sepsis, hyperammonemia- maybe component of HE   P -follow pleural fluid -changed abx to vanc zosyn  -f/u MRSA  -consented for pigtail  --might also need lytics, coagulopathy puts her at a higher risk of bleeding with this -- d/w daughter who is in agreement if lytics felt beneficial  -will start lactulose, rifaximin    Other problems --  Chronic pain, opioid dependence (follows with  pain clinic in [place])  AKI, resolved  Hyponatremia  Hypokalemia AGMA, resolved  Lactic acidosis Cirrhosis  HepC Elevated LFTs Hyperammonemia  Hyperbilirubinemia Coagulopathy  Anemia Thrombocytopenia  Protein calorie malnutrition  GOC / code status discussion  -with pt baseline frailty,  did talk to daughter about if they have ever talked about things like code status etc. Pt has endorsed full code, full scope of offered tx. Sadly it sounds like as a family they have had to traverse withdrawal of care on the pts son in the past-- daughter feels that if something devastating were to happen and pt would not have a fair QOL she would elect for withdrawal of  care at that point  -daughter concerned about mom potentially going into opioid withdrawal since her home MS contin is not ordered -- will order it q12 until she is a little more awake (at home is q8) & tide over with some IV PRN in interim. Not restarting her lyrica or flexiril yet  -rest per primary    Best Practice (right click and "Reselect all SmartList Selections" daily)   Diet/type: Regular consistency (see orders) DVT prophylaxis: SCD GI prophylaxis: PPI Lines: N/A Foley:  N/A Code Status:  full code Last date of multidisciplinary goals of care discussion [11/15]  Labs   CBC: Recent Labs  Lab 02/13/23 1022 02/14/23 0649  WBC 11.7* 3.7*  NEUTROABS 9.6* 2.7  HGB 14.6 11.0*  HCT 44.5 32.6*  MCV 91.6 89.6  PLT 114* 52*    Basic Metabolic Panel: Recent Labs  Lab 02/13/23 1022 02/14/23 0309  NA 128* 130*  K 3.6 3.3*  CL 90* 97*  CO2 21* 24  GLUCOSE 105* 84  BUN 46* 30*  CREATININE 1.64* 0.88  CALCIUM 8.0* 7.5*  MG 2.2 1.8  PHOS  --  2.3*   GFR: Estimated Creatinine Clearance: 46.6 mL/min (by C-G formula based on SCr of 0.88 mg/dL). Recent Labs  Lab 02/13/23 1022 02/13/23 1029 02/13/23 1310 02/13/23 1754 02/14/23 0649  WBC 11.7*  --   --   --  3.7*  LATICACIDVEN  --  6.7* 4.7* 2.8*  --     Liver Function Tests: Recent Labs  Lab 02/13/23 1022 02/14/23 0309  AST 203* 100*  ALT 79* 48*  ALKPHOS 163* 106  BILITOT 1.5* 1.7*  PROT 6.7 5.2*  ALBUMIN 2.5* 2.3*   Recent Labs  Lab 02/13/23 1022  LIPASE 26   Recent Labs  Lab 02/13/23 1022 02/14/23 0309  AMMONIA 51* 26    ABG    Component Value Date/Time   HCO3 20.6 02/13/2023 1108   TCO2 22 07/10/2020 0135   ACIDBASEDEF 5.2 (H) 02/13/2023 1108   O2SAT 34.2 02/13/2023 1108     Coagulation Profile: Recent Labs  Lab 02/13/23 1022 02/13/23 1746  INR 2.0* 2.9*    Cardiac Enzymes: No results for input(s): "CKTOTAL", "CKMB", "CKMBINDEX", "TROPONINI" in the last 168  hours.  HbA1C: Hgb A1c MFr Bld  Date/Time Value Ref Range Status  07/10/2020 07:33 AM 4.6 (L) 4.8 - 5.6 % Final    Comment:    (NOTE) Pre diabetes:          5.7%-6.4%  Diabetes:              >6.4%  Glycemic control for   <7.0% adults with diabetes     CBG: No results for input(s): "GLUCAP" in the last 168 hours.  Review of Systems:   Unable to obtain due to encephalopathy   Past Medical History:  She,  has a past medical history of Acute metabolic encephalopathy, Chronic back pain, DJD (degenerative joint disease), Essential hypertension (07/10/2020), Gallbladder sludge, Hepatitis C (07/10/2020), Liver cirrhosis (HCC), Pain management, Prolonged QT interval, Protein calorie malnutrition (HCC), Thyroid nodule,  and Tobacco use (07/10/2020).   Surgical History:   Past Surgical History:  Procedure Laterality Date   BACK SURGERY     btl     HERNIA REPAIR     WRIST SURGERY       Social History:   reports that she has been smoking. She has never used smokeless tobacco. She reports that she does not currently use drugs. She reports that she does not drink alcohol.   Family History:  Her family history includes Scoliosis in her mother.   Allergies Allergies  Allergen Reactions   Celecoxib Other (See Comments)    Insomnia   Duloxetine Hcl Other (See Comments)    Insomnia   Tizanidine Nausea And Vomiting   Venlafaxine Other (See Comments)    Hallucinations   Effersyllium [Psyllium] Other (See Comments)    Made the patient "feel badly"   Escitalopram Other (See Comments)    insomnia   Neurontin [Gabapentin] Other (See Comments)    Made the patient "feel badly"   Tramadol Other (See Comments)    Made the patient "feel badly"     Home Medications  Prior to Admission medications   Medication Sig Start Date End Date Taking? Authorizing Provider  morphine (MS CONTIN) 30 MG 12 hr tablet Take 30 mg by mouth every 8 (eight) hours.   Yes [provider]  pregabalin  (LYRICA) 300 MG capsule Take 300 mg by mouth in the morning and at bedtime. 01/03/20  Yes [provider]  carvedilol (COREG) 3.125 MG tablet Take 1 tablet (3.125 mg total) by mouth 2 (two) times daily with a meal. Patient not taking: Reported on 02/13/2023 07/03/21   Leroy Sea, MD  cyclobenzaprine (FLEXERIL) 10 MG tablet Take 1 tablet (10 mg total) by mouth at bedtime as needed for muscle spasms. Patient not taking: Reported on 02/13/2023 07/03/21   Leroy Sea, MD  morphine (MS CONTIN) 15 MG 12 hr tablet Take 1 tablet (15 mg total) by mouth every 12 (twelve) hours. Patient not taking: Reported on 02/13/2023 07/03/21   Leroy Sea, MD     Critical care time: na      High MDM   [name] MSN, AGACNP-BC [facility] Pulmonary/Critical Care Medicine 02/14/2023, 8:59 AM

## 2023-02-13 NOTE — Progress Notes (Addendum)
Pharmacy Antibiotic Note  Anita Price is a 67 y.o. female admitted on 02/13/2023 with  Infection of unknown source .  Pharmacy has been consulted for Vanco, Zosyn   dosing.  Active Problem(s): weakness and nausea vomiting x 3-4 days.  - L pleural effusion s/p thoracentesis = 1L purulent fluid  PMH: chronic back pain, Hep C, HTN, hepatic encephalopathy, metabolic encephalopathy . Cirrhosis, DJD, chronic pain syndrome, prolonged QT, PCM, thyroid nodule, tobacco  ID: Unknown source of infection. Afebrile, WBC 11.7, Scr 1.64 Lactic acid 6.7>4.7  Cefepime 11/14 x 1 Flagyl 11/14 x 1 Zosyn 11/14>> Vanco 11/14>>  Plan: Change Cefepime and Flagyl to  Zosyn 3.375g IV q8hr. Verified with Dr. [name]. Vancomycin 1000mg  IV Q 48 hrs. Goal AUC 1400-550. Expected AUC: 470 SCr used: 1.64     Temp (24hrs), Avg:97.6 F (36.4 C), Min:97.4 F (36.3 C), Max:97.8 F (36.6 C)  Recent Labs  Lab 02/13/23 1022 02/13/23 1029 02/13/23 1310  WBC 11.7*  --   --   CREATININE 1.64*  --   --   LATICACIDVEN  --  6.7* 4.7*    CrCl cannot be calculated (Unknown ideal weight.).    Allergies  Allergen Reactions   Celecoxib Other (See Comments)    Insomnia   Duloxetine Hcl Other (See Comments)    Insomnia   Tizanidine Nausea And Vomiting   Venlafaxine Other (See Comments)    Hallucinations   Effersyllium [Psyllium] Other (See Comments)    Made the patient "feel badly"   Escitalopram Other (See Comments)    insomnia   Neurontin [Gabapentin] Other (See Comments)    Made the patient "feel badly"   Tramadol Other (See Comments)    Made the patient "feel badly"    Teretha Chalupa S. Merilynn Finland, PharmD, BCPS Clinical Staff Pharmacist [email or web address]   Pasty Spillers 02/13/2023 5:42 PM

## 2023-02-13 NOTE — ED Triage Notes (Signed)
EMS reports from home, family states weakness and nausea vomiting x 3-4 days.  BP 110/90 HR 110 RR 16 Sp02 96 RA CBG 171

## 2023-02-13 NOTE — Sepsis Progress Note (Signed)
Code Sepsis protocol being monitored by eLink. MD reminded to place blood culture orders.

## 2023-02-13 NOTE — ED Notes (Signed)
Called out for decreased BP, RN at bedside.

## 2023-02-13 NOTE — Procedures (Signed)
Ultrasound-guided diagnostic and therapeutic left thoracentesis performed yielding 1 liter of purulent cream colored/milky fluid. No immediate complications. Follow-up chest x-ray pending.The fluid was sent to the lab for preordered studies. EBL < 2 cc.

## 2023-02-13 NOTE — H&P (Signed)
History and Physical    Patient: Anita Price:[id number] DOB: 07/20/1955 DOA: 02/13/2023 DOS: the patient was seen and examined on 02/13/2023 PCP: Jackie Plum, MD  Patient coming from: Home  Chief Complaint:  Chief Complaint  Patient presents with   Nausea   Emesis   Weakness   HPI: MACKENA RIEB is a 67 y.o. female with medical history significant of hepatic encephalopathy, metabolic encephalopathy, history open hepatitis C, liver cirrhosis, chronic back pain, DJD, chronic pain syndrome, prolonged QT interval, protein calorie malnutrition, thyroid nodule who was brought to the emergency department via EMS due to generalized weakness, anorexia, nausea, multiple episodes of emesis for the last 3 to 4 days.  She is arousable, but confused.  She knows she is at [facility], but did not know why and is disoriented to time and date.  She is able to answer simple questions.  She denied any significant pain or nausea at the time of examination.  Lab work: CBC showed a white count of 11.7 with 81% neutrophils, hemoglobin 14.6 g/dL platelets 308.  PT 23.0 and INR 2.0.  Venous blood gas showed a normal pH, pCO2 of 40 and pO2 of less than 31 mmHg.  Bicarbonate was 28.6 and acid-base deficit 5.2 mmol/L.  Ammonia 51 mol/L.  Lipase 26 units/L.  Magnesium 2.2 mg/dL.  Lactic acid was 6.7 then 4.7 mmol/L.  Unremarkable acetaminophen, alcohol and salicylate level.  CMP showed a sodium 128, potassium 3.6, chloride 90 and CO2 21 mmol/L with an anion gap of 17.  Total bilirubin 1.5, glucose 105, BUN 46 and creatinine 1.64 mg/dL.  Total protein 6.7 and albumin 2.5 g/dL.  AST 203, ALT 79 and alkaline phosphatase 163 units/L.  Imaging: Portable 1 view chest radiograph showed near complete opacification of the left hemithorax.  CT head without contrast with no acute intracranial normality.  CT chest/abdomen/pelvis with contrast show a large loculated left pleural effusion with associated complete  collapse of the left lung lower lobe and lingular segment.  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  Normal size anteverted uterus however, there is new thickening of the endometrium measuring up to 10 to 11 mm.  Clinical correlation and further evaluation with nonemergent pelvic ultrasound recommended.  Cirrhotic liver configuration with mild splenomegaly and multiple venous collateral near the fundus/cardiac of the stomach.  No ascites.  Multiple other nonacute ulcerations, as described above.   ED course: Initial vital signs were temperature 97.8 F, pulse 90, respirations 16, BP 109/74 mmHg O2 sat 96% on room air.  The patient received LR 1500 mL bolus, cefepime, metronidazole and vancomycin.  Review of Systems: As mentioned in the history of present illness. All other systems reviewed and are negative. Past Medical History:  Diagnosis Date   Acute metabolic encephalopathy    Chronic back pain    DJD (degenerative joint disease)    Essential hypertension 07/10/2020   Gallbladder sludge    Hepatitis C 07/10/2020   Liver cirrhosis (HCC)    Pain management    Prolonged QT interval    Protein calorie malnutrition (HCC)    Thyroid nodule    Tobacco use 07/10/2020   Past Surgical History:  Procedure Laterality Date   BACK SURGERY     btl     HERNIA REPAIR     WRIST SURGERY     Social History:  reports that she has been smoking. She has never used smokeless tobacco. She reports that she does not currently use drugs. She reports  that she does not drink alcohol.  Allergies  Allergen Reactions   Celecoxib Other (See Comments)    Insomnia   Duloxetine Hcl Other (See Comments)    Insomnia   Tizanidine Nausea And Vomiting   Venlafaxine Other (See Comments)    Hallucinations   Effersyllium [Psyllium] Other (See Comments)    Makes pt feel bad    Escitalopram Other (See Comments)    insomnia   Neurontin [Gabapentin] Other (See Comments)    Makes pt feel bad   Tramadol Other (See Comments)     Makes pt feel bad    Family History  Problem Relation Age of Onset   Scoliosis Mother     Prior to Admission medications   Medication Sig Start Date End Date Taking? Authorizing Provider  acetaminophen (TYLENOL) 500 MG tablet Take 500 mg by mouth every 6 (six) hours as needed for headache.    [provider]  albuterol (VENTOLIN HFA) 108 (90 Base) MCG/ACT inhaler Inhale into the lungs every 6 (six) hours as needed for wheezing or shortness of breath.    [provider]  carvedilol (COREG) 3.125 MG tablet Take 1 tablet (3.125 mg total) by mouth 2 (two) times daily with a meal. 07/03/21   Leroy Sea, MD  cyclobenzaprine (FLEXERIL) 10 MG tablet Take 1 tablet (10 mg total) by mouth at bedtime as needed for muscle spasms. 07/03/21   Leroy Sea, MD  morphine (MS CONTIN) 15 MG 12 hr tablet Take 1 tablet (15 mg total) by mouth every 12 (twelve) hours. 07/03/21   Leroy Sea, MD  pregabalin (LYRICA) 300 MG capsule Take 300 mg by mouth in the morning and at bedtime. 01/03/20   [provider]    Physical Exam: Vitals:   02/13/23 1100 02/13/23 1130 02/13/23 1200 02/13/23 1300  BP: 112/63 116/64 102/64 117/65  Pulse: (!) 105 (!) 103 98 (!) 109  Resp: 15 17 12 17   Temp:    97.6 F (36.4 C)  TempSrc:      SpO2: 93% (!) 89% 90% 93%   Physical Exam Vitals reviewed.  Constitutional:      General: She is sleeping. She is not in acute distress.    Appearance: She is ill-appearing.  HENT:     Head: Normocephalic.     Nose: No rhinorrhea.     Mouth/Throat:     Mouth: Mucous membranes are dry.  Eyes:     Pupils: Pupils are equal, round, and reactive to light.  Neck:     Vascular: No JVD.  Cardiovascular:     Rate and Rhythm: Regular rhythm. Tachycardia present.     Heart sounds: S1 normal and S2 normal.  Pulmonary:     Effort: Tachypnea and respiratory distress present.     Breath sounds: Examination of the left-middle field reveals decreased breath  sounds. Examination of the left-lower field reveals decreased breath sounds. Decreased breath sounds present. No wheezing, rhonchi or rales.  Abdominal:     General: Bowel sounds are normal. There is no distension.     Palpations: Abdomen is soft.     Tenderness: There is no abdominal tenderness.  Musculoskeletal:     Cervical back: Neck supple.     Right lower leg: No edema.     Left lower leg: No edema.  Skin:    General: Skin is dry.  Neurological:     General: No focal deficit present.     Mental Status: She is easily  aroused. She is disoriented.  Psychiatric:        Mood and Affect: Mood normal.        Behavior: Behavior normal.     Data Reviewed:  Results are pending, will review when available. EKG: Vent. rate 106 BPM PR interval 120 ms QRS duration 90 ms QT/QTcB 443/589 ms P-R-T axes 79 61 -20 Sinus tachycardia Multiple ventricular premature complexes Right atrial enlargement Borderline T abnormalities, diffuse leads Prolonged QT interval  Assessment and Plan: Principal Problem:   Sepsis due to undetermined organism (HCC)   Sepsis due to pneumonia (HCC) Complicated by:   Pleural effusion on left Admit to PCU/inpatient. Continue IV fluids. Begin Zosyn per pharmacy. Continue vancomycin per pharmacy. Follow-up blood culture and sensitivity Follow CBC and CMP in a.m. IR consult and thoracentesis appreciated. -Will follow body fluid analysis. She has been added to the pulmonary consult list.  Active Problems:   Lactic acidosis Worsened by lack of hepatic clearance. Continue treatment as above and follow lactic acid level.    AKI (acute kidney injury) (HCC) Continue IV fluids. Avoid hypotension. Avoid nephrotoxins. Monitor intake and output. Monitor renal function/electrolytes.    Hyponatremia Secondary to GI losses. Continue IV fluids. Follow sodium level in the morning.    Protein-calorie malnutrition, severe In the setting of chronic hep  C. May benefit from protein supplementation. Consider nutritional services evaluation. Follow-up albumin level.    Hepatic cirrhosis in setting of untreated hepatitis C    Encephalopathy, acute metabolic In the setting of infection and hyperammonemia. Continue current treatment as above. Follow-up ammonia level in the morning.    Prolonged QT interval Avoid QT prolonging meds as possible. KCl supplementation. Magnesium sulfate 2 g IVPB now. Keep electrolytes optimized. Check EKG in the morning.    Essential hypertension Blood pressure measurements are soft. Hold antihypertensives.    Tobacco use Nicotine replacement therapy as needed.     Advance Care Planning:   Code Status: Full Code   Consults: IR (Dr. Andrey Campanile) Pulmonary (Dr. Adaline Sill).  Family Communication:   Severity of Illness: The appropriate patient status for this patient is INPATIENT. Inpatient status is judged to be reasonable and necessary in order to provide the required intensity of service to ensure the patient's safety. The patient's presenting symptoms, physical exam findings, and initial radiographic and laboratory data in the context of their chronic comorbidities is felt to place them at high risk for further clinical deterioration. Furthermore, it is not anticipated that the patient will be medically stable for discharge from the hospital within 2 midnights of admission.   * I certify that at the point of admission it is my clinical judgment that the patient will require inpatient hospital care spanning beyond 2 midnights from the point of admission due to high intensity of service, high risk for further deterioration and high frequency of surveillance required.*  Author: Bobette Mo, MD 02/13/2023 1:57 PM  For on call review [email or web address].   This document was prepared using Dragon voice recognition software and may contain some unintended transcription errors.

## 2023-02-13 NOTE — ED Provider Notes (Signed)
[place] EMERGENCY DEPARTMENT AT [facility] Provider Note   CSN: [id number] Arrival date & time: 02/13/23  3016     History  Chief Complaint  Patient presents with   Nausea   Emesis   Weakness    Anita Price is a 67 y.o. female.   Emesis Weakness Associated symptoms: vomiting   67 year old female history of chronic back pain, hepatitis C, hypertension presenting for reported vomiting.  EMS reported patient's had a few days of vomiting and family sent her here for patient here knows her name and knows she is at the hospital but does not know the year.  When I questioned her about why she is here she is not able to write it significant history.  She reports recent nausea and vomiting but denies any pain including headache or abdominal pain or chest pain.  She is unable to elaborate further.  Appears she was admitted in April of last year for encephalopathy which was attributed to excessive narcotic use and UTI.     Home Medications Prior to Admission medications   Medication Sig Start Date End Date Taking? Authorizing Provider  morphine (MS CONTIN) 30 MG 12 hr tablet Take 30 mg by mouth every 8 (eight) hours.   Yes [provider]  pregabalin (LYRICA) 300 MG capsule Take 300 mg by mouth in the morning and at bedtime. 01/03/20  Yes [provider]  carvedilol (COREG) 3.125 MG tablet Take 1 tablet (3.125 mg total) by mouth 2 (two) times daily with a meal. Patient not taking: Reported on 02/13/2023 07/03/21   Leroy Sea, MD  cyclobenzaprine (FLEXERIL) 10 MG tablet Take 1 tablet (10 mg total) by mouth at bedtime as needed for muscle spasms. Patient not taking: Reported on 02/13/2023 07/03/21   Leroy Sea, MD  morphine (MS CONTIN) 15 MG 12 hr tablet Take 1 tablet (15 mg total) by mouth every 12 (twelve) hours. Patient not taking: Reported on 02/13/2023 07/03/21   Leroy Sea, MD      Allergies    Celecoxib, Duloxetine hcl, Tizanidine,  Venlafaxine, Effersyllium [psyllium], Escitalopram, Neurontin [gabapentin], and Tramadol    Review of Systems   Review of Systems  Gastrointestinal:  Positive for vomiting.  Neurological:  Positive for weakness.  Review of systems completed and notable as per HPI.  ROS otherwise negative.   Physical Exam Updated Vital Signs BP 101/72   Pulse 92   Temp 98.6 F (37 C) (Axillary)   Resp (!) 23   Ht 5\' 5"  (1.651 m)   Wt 46.9 kg   SpO2 96%   BMI 17.21 kg/m  Physical Exam Vitals and nursing note reviewed.  Constitutional:      General: She is not in acute distress.    Appearance: She is well-developed.  HENT:     Head: Normocephalic and atraumatic.     Mouth/Throat:     Mouth: Mucous membranes are dry.     Pharynx: Oropharynx is clear.  Eyes:     Extraocular Movements: Extraocular movements intact.     Conjunctiva/sclera: Conjunctivae normal.     Pupils: Pupils are equal, round, and reactive to light.  Cardiovascular:     Rate and Rhythm: Normal rate and regular rhythm.     Pulses: Normal pulses.     Heart sounds: Normal heart sounds. No murmur heard. Pulmonary:     Effort: Pulmonary effort is normal. No respiratory distress.     Breath sounds: Normal breath sounds.  Abdominal:     Palpations: Abdomen is soft.     Tenderness: There is abdominal tenderness. There is no guarding or rebound.  Musculoskeletal:        General: No swelling.     Cervical back: Neck supple. No rigidity or tenderness.     Right lower leg: No edema.     Left lower leg: No edema.  Skin:    General: Skin is warm and dry.     Capillary Refill: Capillary refill takes less than 2 seconds.  Neurological:     General: No focal deficit present.     Mental Status: She is alert.     Comments: Patient is awake alert.  She knows her name and that she is in the hospital.  Does not know the year or month.  She is able to move all extremities antigravity.  Extraocular movements and cranial nerves are intact.   Sensation is intact in all extremities.  Psychiatric:        Mood and Affect: Mood normal.     ED Results / Procedures / Treatments   Labs (all labs ordered are listed, but only abnormal results are displayed) Labs Reviewed  ACETAMINOPHEN LEVEL - Abnormal; Notable for the following components:      Result Value   Acetaminophen (Tylenol), Serum <10 (*)    All other components within normal limits  COMPREHENSIVE METABOLIC PANEL - Abnormal; Notable for the following components:   Sodium 128 (*)    Chloride 90 (*)    CO2 21 (*)    Glucose, Bld 105 (*)    BUN 46 (*)    Creatinine, Ser 1.64 (*)    Calcium 8.0 (*)    Albumin 2.5 (*)    AST 203 (*)    ALT 79 (*)    Alkaline Phosphatase 163 (*)    Total Bilirubin 1.5 (*)    GFR, Estimated 34 (*)    Anion gap 17 (*)    All other components within normal limits  SALICYLATE LEVEL - Abnormal; Notable for the following components:   Salicylate Lvl <7.0 (*)    All other components within normal limits  CBC WITH DIFFERENTIAL/PLATELET - Abnormal; Notable for the following components:   WBC 11.7 (*)    Platelets 114 (*)    nRBC 1.2 (*)    Neutro Abs 9.6 (*)    Abs Immature Granulocytes 0.21 (*)    All other components within normal limits  PROTIME-INR - Abnormal; Notable for the following components:   Prothrombin Time 23.0 (*)    INR 2.0 (*)    All other components within normal limits  AMMONIA - Abnormal; Notable for the following components:   Ammonia 51 (*)    All other components within normal limits  BLOOD GAS, VENOUS - Abnormal; Notable for the following components:   pCO2, Ven 40 (*)    pO2, Ven <31 (*)    Acid-base deficit 5.2 (*)    All other components within normal limits  LACTATE DEHYDROGENASE, PLEURAL OR PERITONEAL FLUID - Abnormal; Notable for the following components:   LD, Fluid >10,000 (*)    All other components within normal limits  BODY FLUID CELL COUNT WITH DIFFERENTIAL - Abnormal; Notable for the following  components:   Appearance, Fluid TURBID (*)    Total Nucleated Cell Count, Fluid 71,355 (*)    Neutrophil Count, Fluid 100 (*)    All other components within normal limits  COMPREHENSIVE METABOLIC PANEL - Abnormal; Notable  for the following components:   Sodium 130 (*)    Potassium 3.3 (*)    Chloride 97 (*)    BUN 30 (*)    Calcium 7.5 (*)    Total Protein 5.2 (*)    Albumin 2.3 (*)    AST 100 (*)    ALT 48 (*)    Total Bilirubin 1.7 (*)    All other components within normal limits  PROTIME-INR - Abnormal; Notable for the following components:   Prothrombin Time 30.7 (*)    INR 2.9 (*)    All other components within normal limits  PHOSPHORUS - Abnormal; Notable for the following components:   Phosphorus 2.3 (*)    All other components within normal limits  CBC WITH DIFFERENTIAL/PLATELET - Abnormal; Notable for the following components:   WBC 3.7 (*)    RBC 3.64 (*)    Hemoglobin 11.0 (*)    HCT 32.6 (*)    Platelets 52 (*)    nRBC 1.6 (*)    All other components within normal limits  PROTIME-INR - Abnormal; Notable for the following components:   Prothrombin Time 25.9 (*)    INR 2.3 (*)    All other components within normal limits  I-STAT CG4 LACTIC ACID, ED - Abnormal; Notable for the following components:   Lactic Acid, Venous 6.7 (*)    All other components within normal limits  I-STAT CG4 LACTIC ACID, ED - Abnormal; Notable for the following components:   Lactic Acid, Venous 4.7 (*)    All other components within normal limits  I-STAT CG4 LACTIC ACID, ED - Abnormal; Notable for the following components:   Lactic Acid, Venous 2.8 (*)    All other components within normal limits  CULTURE, BLOOD (ROUTINE X 2)  BODY FLUID CULTURE W GRAM STAIN  CULTURE, BLOOD (ROUTINE X 2)  MRSA NEXT GEN BY PCR, NASAL  ETHANOL  LIPASE, BLOOD  MAGNESIUM  PROTEIN, PLEURAL OR PERITONEAL FLUID  GLUCOSE, PLEURAL OR PERITONEAL FLUID  AMMONIA  MAGNESIUM  URINALYSIS, ROUTINE W REFLEX  MICROSCOPIC  RAPID URINE DRUG SCREEN, HOSP PERFORMED  FISH ONCOLOGY  CBC WITH DIFFERENTIAL/PLATELET  HIV ANTIBODY (ROUTINE TESTING W REFLEX)  CBC  STREP PNEUMONIAE URINARY ANTIGEN  CYTOLOGY - NON PAP    EKG EKG Interpretation Date/Time:  Thursday February 13 2023 10:20:28 EST Ventricular Rate:  106 PR Interval:  120 QRS Duration:  90 QT Interval:  443 QTC Calculation: 589 R Axis:   61  Text Interpretation: Sinus tachycardia Multiple ventricular premature complexes Right atrial enlargement Borderline T abnormalities, diffuse leads Prolonged QT interval Confirmed by Fulton Reek [phone]) on 02/13/2023 10:24:44 AM  Radiology DG Chest 1 View  Result Date: 02/13/2023 CLINICAL DATA:  Weakness, nausea, vomiting, status post thoracentesis EXAM: CHEST  1 VIEW COMPARISON:  Radiograph and CT earlier today FINDINGS: Decreased left pleural effusion after thoracentesis compared with radiographs earlier today. Small residual portable effusion. Improved aeration of the left lung with residual ground-glass opacities and atelectasis. The right lung is clear. Stable cardiomediastinal silhouette. No pneumothorax. IMPRESSION: Decreased left pleural effusion after thoracentesis. No pneumothorax. Electronically Signed   By: Minerva Fester M.D.   On: 02/13/2023 20:25   US THORACENTESIS ASP PLEURAL SPACE W/IMG GUIDE  Result Date: 02/13/2023 INDICATION: Patient with history of cirrhosis, hepatitis-C, encephalopathy, weakness, loculated left pleural effusion; request received for diagnostic and therapeutic left thoracentesis. EXAM: ULTRASOUND GUIDED DIAGNOSTIC AND THERAPEUTIC LEFT THORACENTESIS MEDICATIONS: 8 mL 1% lidocaine COMPLICATIONS: None immediate. PROCEDURE: An ultrasound  guided thoracentesis was thoroughly discussed with the patient/daughter and questions answered. The benefits, risks, alternatives and complications were also discussed. The patient/daughter understands and wishes to proceed with the  procedure. Written consent was obtained. Ultrasound was performed to localize and mark an adequate pocket of fluid in the left chest. The area was then prepped and draped in the normal sterile fashion. 1% Lidocaine was used for local anesthesia. Under ultrasound guidance a 6 Fr Safe-T-Centesis catheter was introduced. Thoracentesis was performed. The catheter was removed and a dressing applied. FINDINGS: A total of approximately 1 liter of purulent, cream colored/milky fluid was removed. Samples were sent to the laboratory as requested by the clinical team. IMPRESSION: Successful ultrasound guided diagnostic and therapeutic left thoracentesis yielding 1 liter of pleural fluid. Performed by: Artemio Aly Electronically Signed   By: Marliss Coots M.D.   On: 02/13/2023 16:38   CT CHEST ABDOMEN PELVIS W CONTRAST  Result Date: 02/13/2023 CLINICAL DATA:  Sepsis weakness, nausea and vomiting for 3-4 days. EXAM: CT CHEST, ABDOMEN, AND PELVIS WITH CONTRAST TECHNIQUE: Multidetector CT imaging of the chest, abdomen and pelvis was performed following the standard protocol during bolus administration of intravenous contrast. RADIATION DOSE REDUCTION: This exam was performed according to the departmental dose-optimization program which includes automated exposure control, adjustment of the mA and/or kV according to patient size and/or use of iterative reconstruction technique. CONTRAST:  60mL OMNIPAQUE IOHEXOL 300 MG/ML  SOLN COMPARISON:  CT scan abdomen and pelvis from 07/01/2021 and CT scan chest from 07/10/2020 FINDINGS: CT CHEST FINDINGS Cardiovascular: Normal cardiac size. No pericardial effusion. No aortic aneurysm. Mediastinum/Nodes: Redemonstration of an exophytic heterogeneous centrally hypoattenuating and peripherally hyperattenuating walled nodule arising from the inferior aspect of the left thyroid lobe measuring 1.7 x 1.8 cm. This is incompletely characterized on the current examination but appears  unchanged since the prior study from 2022. Please refer to ultrasound thyroid report from April 2022 for additional details. No solid / cystic mediastinal masses. The esophagus is nondistended precluding optimal assessment. No axillary, mediastinal or hilar lymphadenopathy by size criteria. Lungs/Pleura: The trachea and right bronchial tree is patent. There is complete opacification of left bronchial tree with soft tissue attenuation areas and multiple small foci of air. There is large left pleural effusion with sharp margins, favoring loculated pleural effusion. There is mild smooth thickening of the costal pleura. There also 2 small patches of loculated pleural effusion along the medial aspect. Findings favor chronic loculated pleural effusion than empyema. There is associated complete collapse of the left lower lobe and lingular segments of left upper lobe. There is partial aeration of the remaining left upper lobe. No suspicious mass or abscess seen within the collapsed lung. There are dependent changes in the right lung. Minimal centrilobular emphysematous changes also noted in the right lung. Right lung is otherwise clear. No mass, consolidation or pleural effusion. Musculoskeletal: The visualized soft tissues of the chest wall are grossly unremarkable. No suspicious osseous lesions. There are mild multilevel degenerative changes in the visualized spine. CT ABDOMEN PELVIS FINDINGS Hepatobiliary: The liver is normal in size. There is liver surface irregularity/nodularity, compatible with cirrhosis. No discrete suspicious lesion seen on this single phase exam. There are at least 2, subcentimeter, hypoattenuating foci in the right hepatic lobe, which are too small to adequately characterize. No intrahepatic or extrahepatic bile duct dilation. No calcified gallstones. Normal gallbladder wall thickness. No pericholecystic inflammatory changes. Pancreas: Unremarkable. No pancreatic ductal dilatation or surrounding  inflammatory changes. Spleen: There are at  least 3, predominantly peripheral wedge-shaped hypoattenuating areas within the spleen, compatible with splenic infarctions. There is an additional subcentimeter sized hyperattenuating focus along the inferior tip, which is incompletely characterized on the current exam but favored to represent a hemangioma. The spleen is mildly enlarged measuring up to 12.9 cm in length, decreased in size since the prior study from 2022, when it measured up to 14.2 cm. Adrenals/Urinary Tract: Adrenal glands are unremarkable. No suspicious renal mass. No hydronephrosis. No renal or ureteric calculi. Unremarkable urinary bladder. Stomach/Bowel: No disproportionate dilation of the small or large bowel loops. No evidence of abnormal bowel wall thickening or inflammatory changes. The appendix is unremarkable. Vascular/Lymphatic: No ascites or pneumoperitoneum. No abdominal or pelvic lymphadenopathy, by size criteria. No aneurysmal dilation of the major abdominal arteries. There are mild peripheral atherosclerotic vascular calcifications of the aorta and its major branches. There are multiple venous collaterals near the fundus/cardia of the stomach, compatible with sequela of portal hypertension. Reproductive: There is small anteverted uterus. However, there is new endometrial thickening measuring up to 10-11 mm. This is abnormal for the patient of this age group. Clinical correlation and further evaluation with nonemergent pelvic ultrasound is recommended. No large adnexal mass seen. Bilateral fallopian tube closure device noted. Other: There are bilateral small fat containing inguinal hernias. The soft tissues and abdominal wall are otherwise unremarkable. Musculoskeletal: No suspicious osseous lesions. There are mild - moderate multilevel degenerative changes in the visualized spine. Posterior spinal fixation of L4-5 noted with transpedicular screws and rods. IMPRESSION: 1. There is large  loculated left pleural effusion with associated complete collapse of the left lung lower lobe and lingular segment. 2. 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 3. Normal-size anteverted uterus however, there is new thickening of the endometrium measuring up to 10-11 mm. Clinical correlation and further evaluation with nonemergent pelvic ultrasound is recommended. 4. Cirrhotic liver configuration with mild splenomegaly and multiple venous collaterals near the fundus/cardia of the stomach. No ascites. 5. Multiple other nonacute observations, as described above. Electronically Signed   By: Jules Schick M.D.   On: 02/13/2023 13:18   CT Head Wo Contrast  Result Date: 02/13/2023 CLINICAL DATA:  Mental status change.  Weakness. EXAM: CT HEAD WITHOUT CONTRAST TECHNIQUE: Contiguous axial images were obtained from the base of the skull through the vertex without intravenous contrast. RADIATION DOSE REDUCTION: This exam was performed according to the departmental dose-optimization program which includes automated exposure control, adjustment of the mA and/or kV according to patient size and/or use of iterative reconstruction technique. COMPARISON:  CT scan head from 06/30/2021. FINDINGS: Brain: No evidence of acute infarction, hemorrhage, hydrocephalus, extra-axial collection or mass lesion/mass effect. Ventricles are normal. Cerebral volume is age appropriate. Vascular: No hyperdense vessel or unexpected calcification. Skull: Normal. Negative for fracture or focal lesion. Sinuses/Orbits: No acute finding. Other: Visualized mastoid air cells are unremarkable. No mastoid effusion. IMPRESSION: *No acute intracranial abnormality. Electronically Signed   By: Jules Schick M.D.   On: 02/13/2023 12:57   DG Chest Port 1 View  Result Date: 02/13/2023 CLINICAL DATA:  Altered mental status. EXAM: PORTABLE CHEST 1 VIEW COMPARISON:  07/01/2021. FINDINGS: There is near complete opacification of left hemithorax without significant  mediastinal shift. There is aeration of portion of the left upper mid lung zones. Findings may represent combination of left lung atelectasis and/or consolidation with probable loculated large pleural effusion. Right lung fields and right lateral costophrenic angle are clear. No pneumothorax on either side. Evaluation of cardiomediastinal silhouette is nondiagnostic due  to left hemithorax opacification. No acute osseous abnormalities. The soft tissues are within normal limits. IMPRESSION: *Near complete opacification of left hemithorax, as discussed above. Electronically Signed   By: Jules Schick M.D.   On: 02/13/2023 12:54    Procedures .Critical Care  Performed by: Laurence Spates, MD Authorized by: Laurence Spates, MD   Critical care provider statement:    Critical care time (minutes):  30   Critical care time was exclusive of:  Separately billable procedures and treating other patients   Critical care was necessary to treat or prevent imminent or life-threatening deterioration of the following conditions:  Sepsis   Critical care was time spent personally by me on the following activities:  Development of treatment plan with patient or surrogate, discussions with consultants, evaluation of patient's response to treatment, examination of patient, ordering and review of laboratory studies, ordering and review of radiographic studies, ordering and performing treatments and interventions, pulse oximetry, re-evaluation of patient's condition and review of old charts   Care discussed with: admitting provider       Medications Ordered in ED Medications  sodium chloride flush (NS) 0.9 % injection 10 mL (10 mLs Intravenous Given 02/14/23 0919)  acetaminophen (TYLENOL) tablet 650 mg (has no administration in time range)    Or  acetaminophen (TYLENOL) suppository 650 mg (has no administration in time range)  prochlorperazine (COMPAZINE) injection 5 mg (has no administration in time range)   pantoprazole (PROTONIX) injection 40 mg (40 mg Intravenous Given 02/14/23 0915)  vancomycin (VANCOCIN) IVPB 1000 mg/200 mL premix (has no administration in time range)  piperacillin-tazobactam (ZOSYN) IVPB 3.375 g (0 g Intravenous Stopped 02/14/23 1005)  morphine (PF) 2 MG/ML injection 1-2 mg (1 mg Intravenous Given 02/13/23 2330)  Chlorhexidine Gluconate Cloth 2 % PADS 6 each (has no administration in time range)  morphine (MS CONTIN) 12 hr tablet 30 mg (30 mg Oral Given 02/14/23 0915)  lactulose (CHRONULAC) 10 GM/15ML solution 10 g (10 g Oral Given 02/14/23 0921)  rifaximin (XIFAXAN) tablet 550 mg (550 mg Oral Given 02/14/23 0914)  sodium chloride flush (NS) 0.9 % injection 10 mL (10 mLs Intrapleural Given 02/14/23 1049)  morphine (PF) 2 MG/ML injection 1-2 mg (has no administration in time range)  vancomycin (VANCOREADY) IVPB 750 mg/150 mL (750 mg Intravenous New Bag/Given 02/14/23 1401)  alteplase (CATHFLO ACTIVASE) 10 mg in sodium chloride (PF) 0.9 % 30 mL (has no administration in time range)    And  dornase alfa (PULMOZYME) 5 mg in sterile water (preservative free) 30 mL (has no administration in time range)  lactated ringers bolus 1,000 mL (0 mLs Intravenous Stopped 02/13/23 1109)  lactated ringers bolus 500 mL (0 mLs Intravenous Stopped 02/13/23 1235)  ceFEPIme (MAXIPIME) 2 g in sodium chloride 0.9 % 100 mL IVPB (0 g Intravenous Stopped 02/13/23 1235)  metroNIDAZOLE (FLAGYL) IVPB 500 mg (0 mg Intravenous Stopped 02/13/23 1235)  vancomycin (VANCOCIN) IVPB 1000 mg/200 mL premix (0 mg Intravenous Stopped 02/13/23 1355)  iohexol (OMNIPAQUE) 300 MG/ML solution 60 mL (60 mLs Intravenous Contrast Given 02/13/23 1236)  albumin human 25 % solution 25 g (0 g Intravenous Stopped 02/13/23 1904)  potassium chloride 10 mEq in 100 mL IVPB (0 mEq Intravenous Stopped 02/13/23 2133)  fentaNYL (SUBLIMAZE) injection 100 mcg (0 mcg Intravenous Duplicate 02/14/23 1049)  fentaNYL (SUBLIMAZE) 100 MCG/2ML  injection (100 mcg  Given 02/14/23 1048)    ED Course/ Medical Decision Making/ A&P Clinical Course as of 02/14/23 1418  Thu Feb 13, 2023  1010 Unable to reach family despite multiple phone calls. [JD]    Clinical Course User Index [JD] Laurence Spates, MD                                 Medical Decision Making Amount and/or Complexity of Data Reviewed Labs: ordered. Radiology: ordered.  Risk Prescription drug management. Decision regarding hospitalization.   Medical Decision Making:   CICLALY CAPATI is a 67 y.o. female who presented to the ED today with nausea, vomiting.  On exam she appears dry and reports recent vomiting but history is otherwise limited.  She is encephalopathic here but no focal deficit low suspicion for CVA.  She is somewhat tender in her abdomen as well.  Will plan to obtain broad metabolic workup as well as CT head and abdomen.  I attempted call family multiple times but was unable to reach them.   Patient placed on continuous vitals and telemetry monitoring while in ED which was reviewed periodically.  Reviewed and confirmed nursing documentation for past medical history, family history, social history.  Reassessment and Plan:   Patient with marked lactic acidosis.  Suspect some this is related to hepatic dysfunction was also concerning for sepsis.  Says protocol initiated including broad-spectrum antibiotics, fluid bolus.  I reviewed her chest x-ray which shows opacification of the left hemithorax.  CT chest on pelvis reviewed concerning for large left-sided loculated pleural effusion could be empyema.  No other acute normality on CT head or CT abdomen.  Lab work otherwise notable for AKI, anion gap metabolic acidosis and evidence of liver dysfunction.  Her blood pressure has remained stable.  I talked with the hospitalist Dr. Robb Matar and she was admitted for further management.   Patient's presentation is most consistent with acute presentation with  potential threat to life or bodily function.           Final Clinical Impression(s) / ED Diagnoses Final diagnoses:  Sepsis, due to unspecified organism, unspecified whether acute organ dysfunction present [facility])    Rx / DC Orders ED Discharge Orders     None         Laurence Spates, MD 02/14/23 1418

## 2023-02-14 ENCOUNTER — Inpatient Hospital Stay (HOSPITAL_COMMUNITY): Payer: Medicare Other

## 2023-02-14 DIAGNOSIS — G9341 Metabolic encephalopathy: Secondary | ICD-10-CM | POA: Diagnosis not present

## 2023-02-14 DIAGNOSIS — J869 Pyothorax without fistula: Secondary | ICD-10-CM

## 2023-02-14 DIAGNOSIS — J90 Pleural effusion, not elsewhere classified: Secondary | ICD-10-CM | POA: Diagnosis not present

## 2023-02-14 DIAGNOSIS — N179 Acute kidney failure, unspecified: Secondary | ICD-10-CM

## 2023-02-14 DIAGNOSIS — E876 Hypokalemia: Secondary | ICD-10-CM | POA: Insufficient documentation

## 2023-02-14 DIAGNOSIS — F1120 Opioid dependence, uncomplicated: Secondary | ICD-10-CM

## 2023-02-14 DIAGNOSIS — A419 Sepsis, unspecified organism: Secondary | ICD-10-CM | POA: Diagnosis not present

## 2023-02-14 DIAGNOSIS — R9431 Abnormal electrocardiogram [ECG] [EKG]: Secondary | ICD-10-CM

## 2023-02-14 DIAGNOSIS — R935 Abnormal findings on diagnostic imaging of other abdominal regions, including retroperitoneum: Secondary | ICD-10-CM | POA: Diagnosis not present

## 2023-02-14 LAB — COMPREHENSIVE METABOLIC PANEL
ALT: 48 U/L — ABNORMAL HIGH (ref 0–44)
AST: 100 U/L — ABNORMAL HIGH (ref 15–41)
Albumin: 2.3 g/dL — ABNORMAL LOW (ref 3.5–5.0)
Alkaline Phosphatase: 106 U/L (ref 38–126)
Anion gap: 9 (ref 5–15)
BUN: 30 mg/dL — ABNORMAL HIGH (ref 8–23)
CO2: 24 mmol/L (ref 22–32)
Calcium: 7.5 mg/dL — ABNORMAL LOW (ref 8.9–10.3)
Chloride: 97 mmol/L — ABNORMAL LOW (ref 98–111)
Creatinine, Ser: 0.88 mg/dL (ref 0.44–1.00)
GFR, Estimated: >60 mL/min (ref >60)
Glucose, Bld: 84 mg/dL (ref 70–99)
Potassium: 3.3 mmol/L — ABNORMAL LOW (ref 3.5–5.1)
Sodium: 130 mmol/L — ABNORMAL LOW (ref 135–145)
Total Bilirubin: 1.7 mg/dL — ABNORMAL HIGH (ref <1.2)
Total Protein: 5.2 g/dL — ABNORMAL LOW (ref 6.5–8.1)

## 2023-02-14 LAB — CBC WITH DIFFERENTIAL/PLATELET
Abs Immature Granulocytes: 0.04 10*3/uL (ref 0.00–0.07)
Basophils Absolute: 0 10*3/uL (ref 0.0–0.1)
Basophils Relative: 0 %
Eosinophils Absolute: 0 10*3/uL (ref 0.0–0.5)
Eosinophils Relative: 1 %
HCT: 32.6 % — ABNORMAL LOW (ref 36.0–46.0)
Hemoglobin: 11 g/dL — ABNORMAL LOW (ref 12.0–15.0)
Immature Granulocytes: 1 %
Lymphocytes Relative: 18 %
Lymphs Abs: 0.7 10*3/uL (ref 0.7–4.0)
MCH: 30.2 pg (ref 26.0–34.0)
MCHC: 33.7 g/dL (ref 30.0–36.0)
MCV: 89.6 fL (ref 80.0–100.0)
Monocytes Absolute: 0.3 10*3/uL (ref 0.1–1.0)
Monocytes Relative: 7 %
Neutro Abs: 2.7 10*3/uL (ref 1.7–7.7)
Neutrophils Relative %: 73 %
Platelets: 52 10*3/uL — ABNORMAL LOW (ref 150–400)
RBC: 3.64 MIL/uL — ABNORMAL LOW (ref 3.87–5.11)
RDW: 14.7 % (ref 11.5–15.5)
WBC: 3.7 10*3/uL — ABNORMAL LOW (ref 4.0–10.5)
nRBC: 1.6 % — ABNORMAL HIGH (ref 0.0–0.2)

## 2023-02-14 LAB — ECHOCARDIOGRAM COMPLETE
AR max vel: 2.13 cm2
AV Area VTI: 2.29 cm2
AV Area mean vel: 1.93 cm2
AV Mean grad: 3 mm[Hg]
AV Peak grad: 6 mm[Hg]
Ao pk vel: 1.22 m/s
Area-P 1/2: 3.27 cm2
Height: 65 in
S' Lateral: 2.9 cm
Weight: 1654.33 [oz_av]

## 2023-02-14 LAB — HIV ANTIBODY (ROUTINE TESTING W REFLEX): HIV Screen 4th Generation wRfx: NONREACTIVE

## 2023-02-14 LAB — PHOSPHORUS: Phosphorus: 2.3 mg/dL — ABNORMAL LOW (ref 2.5–4.6)

## 2023-02-14 LAB — GLUCOSE, CAPILLARY: Glucose-Capillary: 84 mg/dL (ref 70–99)

## 2023-02-14 LAB — MAGNESIUM: Magnesium: 1.8 mg/dL (ref 1.7–2.4)

## 2023-02-14 LAB — AMMONIA: Ammonia: 26 umol/L (ref 9–35)

## 2023-02-14 LAB — PROTIME-INR
INR: 2.3 — ABNORMAL HIGH (ref 0.8–1.2)
Prothrombin Time: 25.9 s — ABNORMAL HIGH (ref 11.4–15.2)

## 2023-02-14 MED ORDER — FENTANYL CITRATE (PF) 100 MCG/2ML IJ SOLN: 100.0000 ug | Freq: Once | INTRAMUSCULAR | Status: AC

## 2023-02-14 MED ORDER — STERILE WATER FOR INJECTION IJ SOLN
5.0000 mg | Freq: Once | RESPIRATORY_TRACT | Status: AC
  Administered 2023-02-14: 5 mg via INTRAPLEURAL
  Filled 2023-02-14: qty 5

## 2023-02-14 MED ORDER — SODIUM CHLORIDE (PF) 0.9 % IJ SOLN
10.0000 mg | Freq: Once | INTRAMUSCULAR | Status: AC
  Administered 2023-02-14: 10 mg via INTRAPLEURAL
  Filled 2023-02-14: qty 10

## 2023-02-14 MED ORDER — POTASSIUM CHLORIDE 10 MEQ/100ML IV SOLN
10.0000 meq | INTRAVENOUS | Status: AC
  Administered 2023-02-14 (×4): 10 meq via INTRAVENOUS
  Filled 2023-02-14 (×4): qty 100

## 2023-02-14 MED ORDER — RIFAXIMIN 550 MG PO TABS: 550.0000 mg | ORAL_TABLET | Freq: Three times a day (TID) | ORAL | Status: DC

## 2023-02-14 MED ORDER — VANCOMYCIN HCL 750 MG/150ML IV SOLN
750.0000 mg | Freq: Once | INTRAVENOUS | Status: AC
  Administered 2023-02-14: 750 mg via INTRAVENOUS
  Filled 2023-02-14: qty 150

## 2023-02-14 MED ORDER — SODIUM CHLORIDE 0.9% FLUSH
10.0000 mL | Freq: Three times a day (TID) | INTRAVENOUS | Status: DC
  Administered 2023-02-14 – 2023-02-19 (×16): 10 mL via INTRAPLEURAL

## 2023-02-14 MED ORDER — LACTULOSE 10 GM/15ML PO SOLN
10.0000 g | Freq: Three times a day (TID) | ORAL | Status: DC
  Administered 2023-02-14 – 2023-02-18 (×13): 10 g via ORAL
  Filled 2023-02-14 (×13): qty 15

## 2023-02-14 MED ORDER — FENTANYL CITRATE (PF) 100 MCG/2ML IJ SOLN
INTRAMUSCULAR | Status: AC
  Administered 2023-02-14: 100 ug
  Filled 2023-02-14: qty 2

## 2023-02-14 MED ORDER — ORAL CARE MOUTH RINSE
15.0000 mL | OROMUCOSAL | Status: DC | PRN
Start: 2023-02-14 — End: 2023-03-03

## 2023-02-14 MED ORDER — POTASSIUM CHLORIDE CRYS ER 20 MEQ PO TBCR: 40.0000 meq | EXTENDED_RELEASE_TABLET | Freq: Once | ORAL | Status: DC

## 2023-02-14 MED ORDER — KCL IN DEXTROSE-NACL 40-5-0.45 MEQ/L-%-% IV SOLN
INTRAVENOUS | Status: AC
  Filled 2023-02-14 (×2): qty 1000

## 2023-02-14 MED ORDER — VANCOMYCIN HCL 750 MG/150ML IV SOLN
750.0000 mg | Freq: Every day | INTRAVENOUS | Status: DC
Start: 2023-02-15 — End: 2023-02-16
  Administered 2023-02-15: 750 mg via INTRAVENOUS
  Filled 2023-02-14 (×2): qty 150

## 2023-02-14 MED ORDER — MORPHINE SULFATE (PF) 2 MG/ML IV SOLN
1.0000 mg | INTRAVENOUS | Status: DC | PRN
  Administered 2023-02-14 – 2023-02-18 (×15): 2 mg via INTRAVENOUS
  Filled 2023-02-14 (×17): qty 1

## 2023-02-14 MED ORDER — MORPHINE SULFATE ER 30 MG PO TBCR
30.0000 mg | EXTENDED_RELEASE_TABLET | Freq: Two times a day (BID) | ORAL | Status: DC
  Administered 2023-02-14 – 2023-02-15 (×3): 30 mg via ORAL
  Filled 2023-02-14 (×3): qty 1

## 2023-02-14 MED ORDER — RIFAXIMIN 550 MG PO TABS
550.0000 mg | ORAL_TABLET | Freq: Two times a day (BID) | ORAL | Status: DC
  Administered 2023-02-14 – 2023-02-17 (×5): 550 mg via ORAL
  Filled 2023-02-14 (×9): qty 1

## 2023-02-14 NOTE — Assessment & Plan Note (Addendum)
Due to ongoing sepsis.  Waxes and wanes.  No improvement with lactulose.  Low suspicion for significant HE.   - Stop lactulose, Xifaxan (not on these at baseline) - Treat underlying empyema

## 2023-02-14 NOTE — Progress Notes (Signed)
PT screams anytime you touch her. While repositioning her to do in and out cather she told me that she was going to "shit on me."

## 2023-02-14 NOTE — Assessment & Plan Note (Signed)
See above

## 2023-02-14 NOTE — Assessment & Plan Note (Signed)
Baseline Cr 0.8, up to 1.6 on admission, improved with fluids.

## 2023-02-14 NOTE — Assessment & Plan Note (Addendum)
Tbili 1.7, INR 2.9, Na 128, Cr 1.6 on admission, MELD 27 on admission Decompensated due to sepsis INR 2.3, Tbili stable - Stop Lactulose and Xifaxan given diarrhea - Hold diuretics - Outpatient GI follow-up - Trend INR

## 2023-02-14 NOTE — Progress Notes (Signed)
Pharmacy Antibiotic Note  Anita Price is a 67 y.o. female admitted on 02/13/2023 with empyema  Pharmacy has been consulted for Vanco, Zosyn dosing.  Today, 02/14/2023: D2 Vanc/Zosyn WBC dropped from elevated to low overnight Remains afebrile SCr markedly improved overnight Now s/p aspiration of pleural fluid on 11/14, as well as placement of chest tube on 11/15  Plan: Increase vancomycin to 750 mg IV q24 hr (eAUC 531 w/ SCr 0.88; Vd 0.72; TBW CrCl)  Continue Zosyn 3.375g IV q8hr Daily SCr while on vanc + Zosyn   Height: 5\' 5"  (165.1 cm) Weight: 46.9 kg (103 lb 6.3 oz) IBW/kg (Calculated) : 57  Temp (24hrs), Avg:98.2 F (36.8 C), Min:97.7 F (36.5 C), Max:98.7 F (37.1 C)  Recent Labs  Lab 02/13/23 1022 02/13/23 1029 02/13/23 1310 02/13/23 1754 02/14/23 0309 02/14/23 0649  WBC 11.7*  --   --   --   --  3.7*  CREATININE 1.64*  --   --   --  0.88  --   LATICACIDVEN  --  6.7* 4.7* 2.8*  --   --     Estimated Creatinine Clearance: 46.6 mL/min (by C-G formula based on SCr of 0.88 mg/dL).    Allergies  Allergen Reactions   Celecoxib Other (See Comments)    Insomnia   Duloxetine Hcl Other (See Comments)    Insomnia   Tizanidine Nausea And Vomiting   Venlafaxine Other (See Comments)    Hallucinations   Effersyllium [Psyllium] Other (See Comments)    Made the patient "feel badly"   Escitalopram Other (See Comments)    insomnia   Neurontin [Gabapentin] Other (See Comments)    Made the patient "feel badly"   Tramadol Other (See Comments)    Made the patient "feel badly"   Antimicrobials this admission: 11/14 Cefepime/Flagyl x 1 Zosyn 11/14 >> Vanco 11/14 >>  Dose adjustments this admission: 11/15 chg vanc 1000 q48 >> 750 q24 w/ improved SCr  Microbiology results: 11/14-15 BCx: ngtd 11/14 Pl fluid: ngtd but GPC chains on stain   Bernadene Person, PharmD, BCPS [phone] 02/14/2023, 2:33 PM

## 2023-02-14 NOTE — Procedures (Addendum)
Insertion of Chest Tube Procedure Note  Anita Price  [id number]  12-08-1955  Date:02/14/23  Time:10:47 AM    Provider Performing: Lanier Clam   Procedure: Pleural Catheter Insertion w/ Imaging Guidance ([id number])  Indication(s) Effusion  Consent Risks of the procedure as well as the alternatives and risks of each were explained to the patient and/or caregiver.  Consent for the procedure was obtained and is signed in the bedside chart  Anesthesia 1% lidocaine, IV fentanyl    Time Out Verified patient identification, verified procedure, site/side was marked, verified correct patient position, special equipment/implants available, medications/allergies/relevant history reviewed, required imaging and test results available.   Sterile Technique Maximal sterile technique including full sterile barrier drape, hand hygiene, sterile gown, sterile gloves, mask, hair covering, sterile ultrasound probe cover (if used).   Procedure Description Ultrasound used to identify appropriate pleural anatomy for placement and overlying skin marked. Area of placement cleaned and draped in sterile fashion.  A  pigtail pleural catheter was placed into the left pleural space using Seldinger technique. Some resistance with dilator but guidewire unencumbered, pigtail threaded without difficulty. Appropriate return of blood-tinged  fluid was obtained.  The tube was connected to atrium and placed on -20 cm H2O wall suction.   Complications/Tolerance None; patient tolerated the procedure well. Chest X-ray is ordered to verify placement.   EBL Minimal  Specimen(s) none    [name] MSN, AGACNP-BC [facility] Pulmonary/Critical Care Medicine Amion for pager 02/14/2023, 10:48 AM

## 2023-02-14 NOTE — Evaluation (Signed)
Clinical/Bedside Swallow Evaluation Patient Details  Name: Anita Price MRN: [id number] Date of Birth: 1956-01-08  Today's Date: 02/14/2023 Time: SLP Start Time (ACUTE ONLY): 0910 SLP Stop Time (ACUTE ONLY): 0924 SLP Time Calculation (min) (ACUTE ONLY): 14 min  Past Medical History:  Past Medical History:  Diagnosis Date   Acute metabolic encephalopathy    Chronic back pain    DJD (degenerative joint disease)    Essential hypertension 07/10/2020   Gallbladder sludge    Hepatitis C 07/10/2020   Liver cirrhosis (HCC)    Pain management    Prolonged QT interval    Protein calorie malnutrition (HCC)    Thyroid nodule    Tobacco use 07/10/2020   Past Surgical History:  Past Surgical History:  Procedure Laterality Date   BACK SURGERY     btl     HERNIA REPAIR     WRIST SURGERY     HPI:  Pt is a 67 yo presenting 11/14 with AMS and several days of N/V. Admitted with sepsis, possible PNA. CT Head negative; CXR on admission showed near complete opacification of the L hemithorax. Previous swallow eval in April 2023 with swallowing impacted by mentation at that time, but she advanced to regular solids and thin liquids quickly. PMH includes: hepatic encephalopathy, metabolic encephalopathy, history open hepatitis C, liver cirrhosis, chronic back pain, DJD, chronic pain syndrome, prolonged QT interval, protein calorie malnutrition, thyroid nodule    Assessment / Plan / Recommendation  Clinical Impression  Pt appears to be primarily impacted by lethargy and overall mentation. Assessment focused on assisting nurse with medication administration as pt was sleepy and declining most POs, so priority was given to meds. She needed Max faded to Min cues for labial seal around a straw and to initiate sucking. Once she obtained liquid, she consistently swallowed mixed boluses and cleared her oral cavity sufficiently. A delayed cough was noted x2 across intake. Pt with hypotussia and lack of crisp cough.  Given no/low suspicion for baseline dysphagia, suspect that pt should be able to remain on diet ordered by MD (regular solids, thin liquids) as long as she is alert, upright, and attentive to POs when being offered them. Would use increased precaution in the setting of AMS and SLP will continue to follow. SLP Visit Diagnosis: Dysphagia, unspecified (R13.10)    Aspiration Risk  Mild aspiration risk;Moderate aspiration risk    Diet Recommendation Regular;Thin liquid    Liquid Administration via: Straw;Cup Medication Administration: Whole meds with liquid (one at a time) Supervision: Staff to assist with self feeding;Full supervision/cueing for compensatory strategies Compensations: Minimize environmental distractions;Slow rate;Small sips/bites Postural Changes: Seated upright at 90 degrees    Other  Recommendations Oral Care Recommendations: Oral care BID    Recommendations for follow up therapy are one component of a multi-disciplinary discharge planning process, led by the attending physician.  Recommendations may be updated based on patient status, additional functional criteria and insurance authorization.  Follow up Recommendations No SLP follow up      Assistance Recommended at Discharge    Functional Status Assessment Patient has had a recent decline in their functional status and demonstrates the ability to make significant improvements in function in a reasonable and predictable amount of time.  Frequency and Duration min 2x/week  2 weeks       Prognosis Prognosis for improved oropharyngeal function: Good      Swallow Study   General HPI: Pt is a 67 yo presenting 11/14 with AMS and several  days of N/V. Admitted with sepsis. CT Head negative; CXR on admission showed near complete opacification of the L hemithorax. Previous swallow eval in April 2023 with swallowing impacted by mentation at that time, but she advanced to regular solids and thin liquids quickly. PMH includes:  hepatic encephalopathy, metabolic encephalopathy, history open hepatitis C, liver cirrhosis, chronic back pain, DJD, chronic pain syndrome, prolonged QT interval, protein calorie malnutrition, thyroid nodule Type of Study: Bedside Swallow Evaluation Previous Swallow Assessment: see HPI Diet Prior to this Study: Regular;Thin liquids (Level 0) Temperature Spikes Noted: No Respiratory Status: Nasal cannula History of Recent Intubation: No Behavior/Cognition: Lethargic/Drowsy;Cooperative;Requires cueing Oral Cavity Assessment: Dry Oral Care Completed by SLP: Recent completion by staff Oral Cavity - Dentition: Edentulous Vision:  (mostly kept eyes closed) Self-Feeding Abilities: Total assist Patient Positioning: Upright in bed Baseline Vocal Quality: Normal Volitional Cough: Weak Volitional Swallow: Able to elicit    Oral/Motor/Sensory Function Overall Oral Motor/Sensory Function: Generalized oral weakness   Ice Chips Ice chips: Not tested   Thin Liquid Thin Liquid: Impaired Presentation: Straw Oral Phase Impairments: Poor awareness of bolus Pharyngeal  Phase Impairments: Cough - Delayed    Nectar Thick Nectar Thick Liquid: Not tested   Honey Thick Honey Thick Liquid: Not tested   Puree Puree: Not tested   Solid     Solid: Impaired (pills) Pharyngeal Phase Impairments: Cough - Delayed      Mahala Menghini., M.A. CCC-SLP Acute Rehabilitation Services Office [phone]  Secure chat preferred  02/14/2023,9:35 AM

## 2023-02-14 NOTE — Assessment & Plan Note (Signed)
-   Resume home morphine

## 2023-02-14 NOTE — Assessment & Plan Note (Addendum)
Resolved

## 2023-02-14 NOTE — Procedures (Signed)
Pleural Fibrinolytic Administration Procedure Note  Anita Price  [id number]  [birth year]  Date:02/14/23  Time:3:44 PM   Provider Performing:Anita Price Anita Price Anita Price   Procedure: Pleural Fibrinolysis Initial day [phone])  Indication(s) Fibrinolysis of complicated pleural effusion  Consent Risks of the procedure as well as the alternatives and risks of each were explained to the patient and/or caregiver.  Consent for the procedure was obtained.   Anesthesia None   Time Out Verified patient identification, verified procedure, site/side was marked, verified correct patient position, special equipment/implants available, medications/allergies/relevant history reviewed, required imaging and test results available.   Sterile Technique Hand hygiene, gloves   Procedure Description Existing pleural catheter was cleaned and accessed in sterile manner.  10mg  of tPA in 30cc of saline and 5mg  of dornase in 30cc of sterile water were injected into pleural space using existing pleural catheter.  Catheter will be clamped for 1 hour and then placed back to suction.   Complications/Tolerance None; patient tolerated the procedure well.  EBL None   Specimen(s) None

## 2023-02-14 NOTE — Hospital Course (Addendum)
67 year old female with chronic medical history of Chronic hepatitis C/liver cirrhosis which has been untreated in the past--underlying cirrhosis, tobacco abuse,  Chronic pain syndrome due to chronic back pain--- underlying cervical radiculopathy status post C3-C6 ACDF Dr. Yevette Edwards--- follows with pain clinic in [place] is debating spinal cord stimulator trial, Degenerative joint disease, HTN,previous thyroid nodule, brought to the ED 11/14 for generalized weakness, anorexia nausea and vomiting multiple times x 3 to 4 days PTA. In the ED arousable but confused workup revealed large loculated left pleural effusion endometrial thickening splenic infarcts Patient was admitted on antibiotics underwent thoracentesis pulmonary IR consult with prolonged hospitalization and multiple procedure as below:   11/14: Admitted on antibiotics, thoracentesis with purulent material 11/15: Pulmonology consulted, chest tube inserted 11/16: Breathing worse; Up to 35L HFNC; Pleural Lytics given 11/18: Second chest tube placed by IR 11/19: Dobbhoff Cortrak placed--- palliative medicine consulted additionally for goals of care--remains full code. 11/22: Lower right-sided chest tube removed 11/23: all chest tubes removed 11/23: abd paracentesis done 450 cc 11/28: Doing well oriented NG tube feeding, FMS and Foley catheter discontinued 12/2: Tachycardia A-fib with RVR cardiology consulted admitted Cystaid with propranolol, cardizem.  Significant studies: 11/14 CT head: NAD 11/14 CT chest: large left pleural effusion 11/15 Echo: Normal EF, normal valves 11/18 CT chest repeat moderate large volume left pleural effusion 20 cm greatest dimension, left base pigtail catheter drainage subscribe pragmatic right basilar nodular pulmonary opacities multifocal pneumonia 11/22 CT chest = substantial improvement left empyema lateral component completely evacuated residue 2 loculations 2.8 X3.9, 2.4 X3.1 11/22 CT abdomen pelvis  cirrhosis liver portal hypertension gastrorenal shunt distal varices moderate ascites anasarca-probable splenic infarct?  Laceration seems stable from prior with no extravasation?  Subsegmental pulmonary embolus posterior basilar right lower lobe 11/20 repeat CT chest with contrast shows right lower lobe segmental and subsegmental emboli no heart strain loculated effusion left with pleural thickening left lower lobe atelectasis small right pleural effusion with patchy airspace disease and cirrhosis?  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

## 2023-02-14 NOTE — Assessment & Plan Note (Addendum)
Incidental new endometrial thickening measuring up to 10-11 mm abnormal for this age group.  - Nonemergent pelvic ultrasound is recommended in outpatient setting

## 2023-02-14 NOTE — Assessment & Plan Note (Signed)
Ensure

## 2023-02-14 NOTE — Progress Notes (Signed)
Progress Note   Patient: Anita Price DOB: March 30, 1956 DOA: 02/13/2023     1 DOS: the patient was seen and examined on 02/14/2023 at 11:58 AM      Brief hospital course: 67 y.o. F with cirrhosis, HTH, hx HE who presented with vomiting and decreased mentation, found to have empyema and sepsis.     Significant events: 11/14: Admitted on antibiotics, thoracentesis with purulent material 11/15: Pulmonology consulted, chest tube inserted    Significant studies: 11/14 CT head: NAD 11/14 CT chest: large left pleural effusion 11/15 Echo: pending   Significant microbiology data: 11/14 Bcx x1: NGTD 11/14 pleural fluid: gram stan positive NGTD    Procedures: 11/14: Thoracentesis    Consults: Pulmonology IR      Assessment and Plan: * Sepsis due to undetermined organism (HCC) Empyema P/w tachycardia, tachypnea, SOFA 4 (mental status, pao2/fio2, creatinine, bilirubin) and empyema.  Thoracentesis on 11/14 showed exudative fluid, >70K nucleated cells, and abundant GPCs. - Continue Vanc and zosyn - Consult Pulmonology - Follow culture data    Empyema (HCC) See above  Acute metabolic encephalopathy At baseline without cognitive impairment.  On admission was confused and poorly responsive.  Due to sepsis.  Ammonia only mildly elevated Mentation improving today.  - Agree with new Rifaximin    Uncomplicated opioid dependence (HCC) - Resume home morphine  Hypokalemia - Supplement K  Abnormal ultrasound of uterus Incidental new endometrial thickening measuring up to 10-11 mm abnormal for this age group.  - Nonemergent pelvic ultrasound is recommended in outpatient setting  AKI (acute kidney injury) (HCC) Baseline Cr 0.8, up to 1.6 on admission, improved today with fluids.  Protein-calorie malnutrition, severe - Ensure   Hepatic cirrhosis in setting of untreated hepatitis C  Tbili 1.7, INR 2.9, Na 128, Cr 1.6 on admission, MELD 27 -  Xifaxan - Hold diuretics - Outpatient GI follow up  Prolonged QT interval - Monitor on telemetry  Hyponatremia Improved slightly overnight. - Hold diuretics for now  Essential hypertension BP soft - Hold Coreg until hemodynamics stabilize          Subjective: Patient is getting more alert, but she is still very weak.  She has had a chest tube placed today.  She is sluggish, overall feels "bad".  Mentation is improving.  No respiratory distress.  No fever.  No nursing concerns.     Physical Exam: BP 108/63   Pulse 87   Temp 98.6 F (37 C) (Axillary)   Resp 15   Ht 5\' 5"  (1.651 m)   Wt 46.9 kg   SpO2 98%   BMI 17.21 kg/m   Frail elderly female, lying in bed, sluggish but opens eyes, makes eye contact, responds slowly to questions RRR, no obvious murmurs, no peripheral edema, no JVD Respiratory rate somewhat fast, but no accessory muscle use, chest tube on the left with scant serous fluid Abdomen soft without tenderness palpation Attention slowed, psychomotor slowing noted, but oriented to self, daughter, [facility], not to the month or year.  She has severe generalized weakness.  Speech is slow but fluent    Data Reviewed: Discussed with pulmonology VBG unremarkable Sodium up to 130, potassium down to 3.3, LFTs improving to 100/46 Creatinine down to 0.8, baseline Lactic acid improved Platelets 114   Family Communication: Daughter at the bedside    Disposition: Status is: Inpatient         Author: Alberteen Sam, MD 02/14/2023 3:11 PM  For on call review  [email or web address]

## 2023-02-14 NOTE — Assessment & Plan Note (Signed)
Slightly low, asymptomatic

## 2023-02-14 NOTE — Assessment & Plan Note (Signed)
Resolved

## 2023-02-14 NOTE — Assessment & Plan Note (Signed)
BP soft - Hold Coreg until hemodynamics stabilize

## 2023-02-14 NOTE — Assessment & Plan Note (Addendum)
Empyema P/w tachycardia, tachypnea, SOFA 4 (mental status, pao2/fio2, creatinine, bilirubin) and empyema.  Thoracentesis on 11/14 showed thick milky fluid, >70K nucleated cells, strep intermedius, pan sensitive Chest tube placed 11/15 - Continue cefazolin - Consult Pulmonology - Second chest tube today

## 2023-02-15 ENCOUNTER — Inpatient Hospital Stay (HOSPITAL_COMMUNITY): Payer: Medicare Other

## 2023-02-15 DIAGNOSIS — J869 Pyothorax without fistula: Secondary | ICD-10-CM | POA: Diagnosis not present

## 2023-02-15 DIAGNOSIS — R935 Abnormal findings on diagnostic imaging of other abdominal regions, including retroperitoneum: Secondary | ICD-10-CM | POA: Diagnosis not present

## 2023-02-15 DIAGNOSIS — G9341 Metabolic encephalopathy: Secondary | ICD-10-CM | POA: Diagnosis not present

## 2023-02-15 DIAGNOSIS — D696 Thrombocytopenia, unspecified: Secondary | ICD-10-CM | POA: Insufficient documentation

## 2023-02-15 DIAGNOSIS — A419 Sepsis, unspecified organism: Secondary | ICD-10-CM | POA: Diagnosis not present

## 2023-02-15 LAB — CBC WITH DIFFERENTIAL/PLATELET
Abs Immature Granulocytes: 0.05 10*3/uL (ref 0.00–0.07)
Basophils Absolute: 0 10*3/uL (ref 0.0–0.1)
Basophils Relative: 1 %
Eosinophils Absolute: 0 10*3/uL (ref 0.0–0.5)
Eosinophils Relative: 0 %
HCT: 35.9 % — ABNORMAL LOW (ref 36.0–46.0)
Hemoglobin: 11.8 g/dL — ABNORMAL LOW (ref 12.0–15.0)
Immature Granulocytes: 1 %
Lymphocytes Relative: 19 %
Lymphs Abs: 0.9 10*3/uL (ref 0.7–4.0)
MCH: 30.2 pg (ref 26.0–34.0)
MCHC: 32.9 g/dL (ref 30.0–36.0)
MCV: 91.8 fL (ref 80.0–100.0)
Monocytes Absolute: 0.4 10*3/uL (ref 0.1–1.0)
Monocytes Relative: 9 %
Neutro Abs: 3.4 10*3/uL (ref 1.7–7.7)
Neutrophils Relative %: 70 %
Platelets: 41 10*3/uL — ABNORMAL LOW (ref 150–400)
RBC: 3.91 MIL/uL (ref 3.87–5.11)
RDW: 15.3 % (ref 11.5–15.5)
WBC: 4.8 10*3/uL (ref 4.0–10.5)
nRBC: 0.4 % — ABNORMAL HIGH (ref 0.0–0.2)

## 2023-02-15 LAB — RENAL FUNCTION PANEL
Albumin: 2.1 g/dL — ABNORMAL LOW (ref 3.5–5.0)
Anion gap: 9 (ref 5–15)
BUN: 22 mg/dL (ref 8–23)
CO2: 23 mmol/L (ref 22–32)
Calcium: 7.4 mg/dL — ABNORMAL LOW (ref 8.9–10.3)
Chloride: 99 mmol/L (ref 98–111)
Creatinine, Ser: 0.8 mg/dL (ref 0.44–1.00)
GFR, Estimated: >60 mL/min (ref >60)
Glucose, Bld: 118 mg/dL — ABNORMAL HIGH (ref 70–99)
Phosphorus: 2.3 mg/dL — ABNORMAL LOW (ref 2.5–4.6)
Potassium: 4.1 mmol/L (ref 3.5–5.1)
Sodium: 131 mmol/L — ABNORMAL LOW (ref 135–145)

## 2023-02-15 LAB — STREP PNEUMONIAE URINARY ANTIGEN: Strep Pneumo Urinary Antigen: NEGATIVE

## 2023-02-15 LAB — MRSA NEXT GEN BY PCR, NASAL: MRSA by PCR Next Gen: NOT DETECTED

## 2023-02-15 MED ORDER — SODIUM CHLORIDE (PF) 0.9 % IJ SOLN
10.0000 mg | Freq: Once | INTRAMUSCULAR | Status: AC
  Administered 2023-02-15: 10 mg via INTRAPLEURAL
  Filled 2023-02-15: qty 10

## 2023-02-15 MED ORDER — SODIUM CHLORIDE 0.9 % IV SOLN: INTRAVENOUS | Status: DC | PRN

## 2023-02-15 MED ORDER — KCL IN DEXTROSE-NACL 40-5-0.45 MEQ/L-%-% IV SOLN
INTRAVENOUS | Status: DC
  Filled 2023-02-15: qty 1000

## 2023-02-15 MED ORDER — CARMEX CLASSIC LIP BALM EX OINT
TOPICAL_OINTMENT | CUTANEOUS | Status: DC | PRN
  Filled 2023-02-15: qty 10

## 2023-02-15 MED ORDER — STERILE WATER FOR INJECTION IJ SOLN
5.0000 mg | Freq: Once | RESPIRATORY_TRACT | Status: AC
  Administered 2023-02-15: 5 mg via INTRAPLEURAL
  Filled 2023-02-15: qty 5

## 2023-02-15 NOTE — Procedures (Signed)
Pleural Fibrinolytic Administration Procedure Note  Anita Price  [id number]  05-Aug-1955  Date:02/15/23  Time:11:40 AM   Provider Performing:Jonathan Corpus C [name]   Procedure: Pleural Fibrinolysis Subsequent day [phone])  Indication(s) Fibrinolysis of complicated pleural effusion  Consent Risks of the procedure as well as the alternatives and risks of each were explained to the patient and/or caregiver.  Consent for the procedure was obtained.   Anesthesia None   Time Out Verified patient identification, verified procedure, site/side was marked, verified correct patient position, special equipment/implants available, medications/allergies/relevant history reviewed, required imaging and test results available.   Sterile Technique Hand hygiene, gloves   Procedure Description Existing pleural catheter was cleaned and accessed in sterile manner.  10mg  of tPA in 30cc of saline and 5mg  of dornase in 30cc of sterile water were injected into pleural space using existing pleural catheter.  Catheter will be clamped for 1 hour and then placed back to suction.   Complications/Tolerance None; patient tolerated the procedure well.  EBL None   Specimen(s) None

## 2023-02-15 NOTE — Progress Notes (Signed)
   NAME:  Anita Price, MRN:  [id number], DOB:  [birth year], LOS: 2 ADMISSION DATE:  02/13/2023, CONSULTATION DATE:  02/13/23 REFERRING MD:  Robb Matar - TRH, CHIEF COMPLAINT:  weakness6   History of Present Illness:  67 yo F PMH hep C, cirrhosis, HE, chronic pain, prolonged qtc who presented to [facility] ED 11/14 for 3-4d hx weakness + anorexia, n/v. In ED she was noted to be altered, unable to provide much reliable history. Had a CXR which showed L effusion. Underwent CT H c/a/p -- no intracranial abnormality, large L loculated pleural effusion, new thickening of endometrium, liver cirrhosis, splenomegaly, multiple splenic infarcts. Her labs revealed an AKI, meta cidosis, elevated LFTs, electrolyte disturbances, elevated ammonia, lactic acidosis, thrombocytopenia and coagulopathy. Mild leukocytosis. Started on vanc cefepime flagyl.  Underwent thora with IR, 1L milky purulent fluid off. Sent for cx. CXR post procedure with some residual effusion.  PCCM consulted in this setting, for pulm consult to see 11/15 AM.    In d/w daughter 11/15, this came out of nowhere a few days ago. Usually takes care of nephews and is functioning well w her chronic dz processes. Has had encephalopathy in the past r/t infections. Baseline opioid dependence -- follows w pain clinic in [place] & is considering a SCS trial   Pertinent  Medical History  Chronic pain DJD Prolonged qtc Protein calorie malnutrition Cirrhosis Hep C Hepatic encephalopathy   Significant Hospital Events: Including procedures, antibiotic start and stop dates in addition to other pertinent events   11/14 Admit to [facility]. IR thora.  11/15 pccm for chest tube, empyema LD> 10K, 71K TNC, 100 neutrophils and was turbid appearing    Interim History / Subjective:  Foley placed last night. About 600 out.  Objective   Blood pressure 113/60, pulse 81, temperature (!) 97.2 F (36.2 C), temperature source Axillary, resp. rate 13, height 5\' 5"  (1.651 m), weight  46.9 kg, SpO2 99%.        Intake/Output Summary (Last 24 hours) at 02/15/2023 4098 Last data filed at 02/15/2023 0600 Gross per 24 hour  Intake 1445.75 ml  Output 1227 ml  Net 218.75 ml   Filed Weights   02/13/23 1700 02/13/23 2030  Weight: 49.9 kg 46.9 kg    Examination: Chronically ill cachetic woman in NAD Chest tube in place with serosanguinous output Very weak Improving mentation Moves to command  BMP looks okay GPCs in chains in pleural fluid Plts still drifting down Hgb stable  CXR stable  Resolved Hospital Problem list     Assessment & Plan:   L loculated pleural effusion s/p thora (IR, 11/14) -- empyema  looks like strep Possible PNA  Sepsis 2/2 above improved Acute encephalopathy 2/2 sepsis, likely HE- improving, no BM yet Chronic opiate dependent pain Dysphagia Frail Severe protein calorie malnutrition Hep C cirrhosis  - Pigtail to suction - Repeat pleural lytics - Tailor abx to culture data, will need at least 4 weeks therapy (PO can be fine) - Consider cortrak to enable lactulose and rifaximin administration - Consider palliative consultation prior to DC  Myrla Halsted MD PCCM

## 2023-02-15 NOTE — Plan of Care (Signed)
  Problem: Clinical Measurements: Goal: Diagnostic test results will improve Outcome: Progressing   Problem: Clinical Measurements: Goal: Ability to maintain clinical measurements within normal limits will improve Outcome: Progressing Goal: Diagnostic test results will improve Outcome: Progressing   Problem: Nutrition: Goal: Adequate nutrition will be maintained Outcome: Not Progressing Note: Not able to eat or keep oral medications down.  Not keeping oral intake of fluids either.

## 2023-02-15 NOTE — Progress Notes (Signed)
Progress Note   Patient: Anita Price ION:[id number] DOB: 1955-04-10 DOA: 02/13/2023     2 DOS: the patient was seen and examined on 02/15/2023 at 12:15 PM      Brief hospital course: 67 y.o. F with cirrhosis, HTH, hx HE who presented with vomiting and decreased mentation, found to have empyema and sepsis.     Significant events: 11/14: Admitted on antibiotics, thoracentesis with purulent material 11/15: Pulmonology consulted, chest tube inserted    Significant studies: 11/14 CT head: NAD 11/14 CT chest: large left pleural effusion 11/15 Echo: Normal EF, normal valves   Significant microbiology data: 11/14 Bcx x1: NGTD 11/14 pleural fluid: Strep intermedius    Procedures: 11/14: Thoracentesis 11/15: Chest tube insertion 11/16: Pleural lytics    Consults: Pulmonology IR      Assessment and Plan: * Sepsis due to undetermined organism (HCC) Empyema P/w tachycardia, tachypnea, SOFA 4 (mental status, pao2/fio2, creatinine, bilirubin) and empyema.  Thoracentesis on 11/14 showed exudative fluid, >70K nucleated cells, and abundant GPCs. - Continue Vanc and zosyn, will discuss tapering to cefazolin today with pulmonology and pharmacy - Consult Pulmonology - Follow culture data    Empyema [facility]) Chest tube placed 11/15 Lytics #1 administered 11/16 - Consult Pulmonology - Monitor I/O  Acute metabolic encephalopathy At baseline without cognitive impairment.  On admission was confused and poorly responsive.  Due to sepsis primarily, agree HE may contribute.    Still somewhat decreased metnation, weak. - Lactulose and Xifaxan as able - Maintenance IV fluids    Uncomplicated opioid dependence (HCC) - IV morphine as needed - Hold MS Contin  Hypokalemia - Supplement K  Abnormal ultrasound of uterus Incidental new endometrial thickening measuring up to 10-11 mm abnormal for this age group.  - Nonemergent pelvic ultrasound is recommended in outpatient  setting  AKI (acute kidney injury) (HCC) Baseline Cr 0.8, up to 1.6 on admission, improved with fluids.  Protein-calorie malnutrition, severe - Ensure when she is able to take p.o.  Hepatic cirrhosis in setting of untreated hepatitis C  Tbili 1.7, INR 2.9, Na 128, Cr 1.6 on admission, MELD 27 Decompensated due to sepsis - Continue lactulose and Xifaxan - Hold diuretics - Outpatient GI follow-up -Trend INR  Prolonged QT interval Repeat ECG shows this is resolved.  Hyponatremia Na no change - Hold diuretics for now  Essential hypertension BP soft - Hold Coreg until hemodynamics stabilize  Thrombocytopenia Due to cirrhosis - Trend platelets        Subjective: Patient is very weak and tired, she is not able to take much by mouth.  Nursing.  She is tachypneic.  She is not able to volunteer much history, but she states that she does not feel well, she is tired.  Oriented to self, lasted long hospital still.  Lytics administered today.     Physical Exam: BP 116/67 (BP Location: Left Arm)   Pulse 79   Temp 98.1 F (36.7 C) (Axillary)   Resp (!) 24   Ht 5\' 5"  (1.651 m)   Wt 46.9 kg   SpO2 97%   BMI 17.21 kg/m   Frail elderly female, lying in bed, sluggish and weak Heart rate normal, regular, no murmurs, no peripheral pitting Respiratory rate elevated, noted assessor muscle use or respiratory distress, chest tube in place, lung sounds diminished on the left, no wheezes appreciated Abdomen soft without tenderness to palpation or guarding Attention diminished, psychomotor slowing noted, oriented to self, [employer], not anything else, severe generalized weakness  Data Reviewed: Discussed with pulmonology Chest x-ray shows no change from yesterday Platelets 41 Sodium 131 INR down to 2.3 HIV negative Potassium and creatinine normal  Family Communication: None present    Disposition: Status is: Inpatient         Author: Alberteen Sam,  MD 02/15/2023 1:36 PM  For on call review [email or web address].

## 2023-02-15 NOTE — Plan of Care (Signed)
  Problem: Fluid Volume: Goal: Hemodynamic stability will improve 02/15/2023 0216 by Aline Brochure, RN Outcome: Progressing 02/15/2023 0214 by Aline Brochure, RN Outcome: Progressing   Problem: Clinical Measurements: Goal: Diagnostic test results will improve 02/15/2023 0216 by Aline Brochure, RN Outcome: Progressing 02/15/2023 0214 by Aline Brochure, RN Outcome: Progressing Goal: Signs and symptoms of infection will decrease Outcome: Progressing   Problem: Respiratory: Goal: Ability to maintain adequate ventilation will improve 02/15/2023 0216 by Aline Brochure, RN Outcome: Progressing 02/15/2023 0214 by Aline Brochure, RN Outcome: Progressing   Problem: Education: Goal: Knowledge of General Education information will improve Description: Including pain rating scale, medication(s)/side effects and non-pharmacologic comfort measures 02/15/2023 0216 by Aline Brochure, RN Outcome: Progressing 02/15/2023 0214 by Aline Brochure, RN Outcome: Progressing   Problem: Health Behavior/Discharge Planning: Goal: Ability to manage health-related needs will improve 02/15/2023 0216 by Aline Brochure, RN Outcome: Progressing 02/15/2023 0214 by Aline Brochure, RN Outcome: Progressing   Problem: Clinical Measurements: Goal: Ability to maintain clinical measurements within normal limits will improve 02/15/2023 0216 by Aline Brochure, RN Outcome: Progressing 02/15/2023 0214 by Aline Brochure, RN Outcome: Progressing Goal: Will remain free from infection Outcome: Progressing Goal: Diagnostic test results will improve 02/15/2023 0216 by Aline Brochure, RN Outcome: Progressing 02/15/2023 0214 by Aline Brochure, RN Outcome: Progressing Goal: Respiratory complications will improve 02/15/2023 0216 by Aline Brochure, RN Outcome: Progressing 02/15/2023 0214 by Aline Brochure, RN Outcome: Progressing Goal: Cardiovascular complication will be avoided 02/15/2023 0216 by  Aline Brochure, RN Outcome: Progressing 02/15/2023 0214 by Aline Brochure, RN Outcome: Progressing   Problem: Coping: Goal: Level of anxiety will decrease Outcome: Progressing   Problem: Elimination: Goal: Will not experience complications related to bowel motility Outcome: Progressing Goal: Will not experience complications related to urinary retention Outcome: Progressing   Problem: Pain Management: Goal: General experience of comfort will improve 02/15/2023 0216 by Aline Brochure, RN Outcome: Progressing 02/15/2023 0214 by Aline Brochure, RN Outcome: Progressing   Problem: Safety: Goal: Ability to remain free from injury will improve 02/15/2023 0216 by Aline Brochure, RN Outcome: Progressing 02/15/2023 0214 by Aline Brochure, RN Outcome: Progressing   Problem: Skin Integrity: Goal: Risk for impaired skin integrity will decrease 02/15/2023 0216 by Aline Brochure, RN Outcome: Progressing 02/15/2023 0214 by Aline Brochure, RN Outcome: Progressing

## 2023-02-15 NOTE — Progress Notes (Signed)
eLink Physician-Brief Progress Note Patient Name: Anita Price DOB: 05-23-1955 MRN: [id number]   Date of Service  02/15/2023  HPI/Events of Note  Patient with urinary retention of > 700 ml of urine on bladder scanning followed by In / Out bladder catheterization. Urinary retention recurred and patient has zero urge to void.  eICU Interventions  Foley catheter insertion ordered.        Iara Monds U Reika Callanan 02/15/2023, 3:12 AM

## 2023-02-15 NOTE — Progress Notes (Signed)
eLink Physician-Brief Progress Note Patient Name: Anita Price DOB: 1955/09/04 MRN: [id number]   Date of Service  02/15/2023  HPI/Events of Note  Asked to evaluate patient for tachypnea and possible intubation.  eICU Interventions  Video assessment of patient done.  Breathing does not appear labored.  Respiratory rate is in the high teens.  No evidence of bronchospasm.  No accessory muscle use.  Currently on Optiflow. Will continue to monitor throughout the night.  3:55 AM: Addendum: Asked to evaluate patient again for tachypnea.  Video assessment of patient once again done.  No accessory muscle use.  Patient has rapid shallow breathing which could be from acidosis.  Will check an ABG and lactic acid level.  She is not needing mechanical ventilatory support at this time.  6 AM: Addendum: Lactic acid level came back at 2.4.  ABG not done.  Her tachypnea is being driven by her acidosis.  I explained this to patient's bedside nurse.     Intervention Category Major Interventions: Respiratory failure - evaluation and management  Carilyn Goodpasture 02/15/2023, 10:31 PM

## 2023-02-16 ENCOUNTER — Inpatient Hospital Stay (HOSPITAL_COMMUNITY): Payer: Medicare Other

## 2023-02-16 DIAGNOSIS — A419 Sepsis, unspecified organism: Secondary | ICD-10-CM | POA: Diagnosis not present

## 2023-02-16 DIAGNOSIS — J869 Pyothorax without fistula: Secondary | ICD-10-CM | POA: Diagnosis not present

## 2023-02-16 DIAGNOSIS — G9341 Metabolic encephalopathy: Secondary | ICD-10-CM | POA: Diagnosis not present

## 2023-02-16 DIAGNOSIS — R935 Abnormal findings on diagnostic imaging of other abdominal regions, including retroperitoneum: Secondary | ICD-10-CM | POA: Diagnosis not present

## 2023-02-16 LAB — CBC WITH DIFFERENTIAL/PLATELET
Abs Immature Granulocytes: 0.09 10*3/uL — ABNORMAL HIGH (ref 0.00–0.07)
Basophils Absolute: 0 10*3/uL (ref 0.0–0.1)
Basophils Relative: 0 %
Eosinophils Absolute: 0 10*3/uL (ref 0.0–0.5)
Eosinophils Relative: 1 %
HCT: 36 % (ref 36.0–46.0)
Hemoglobin: 12 g/dL (ref 12.0–15.0)
Immature Granulocytes: 2 %
Lymphocytes Relative: 15 %
Lymphs Abs: 0.8 10*3/uL (ref 0.7–4.0)
MCH: 30.5 pg (ref 26.0–34.0)
MCHC: 33.3 g/dL (ref 30.0–36.0)
MCV: 91.6 fL (ref 80.0–100.0)
Monocytes Absolute: 0.5 10*3/uL (ref 0.1–1.0)
Monocytes Relative: 9 %
Neutro Abs: 4 10*3/uL (ref 1.7–7.7)
Neutrophils Relative %: 73 %
Platelets: 43 10*3/uL — ABNORMAL LOW (ref 150–400)
RBC: 3.93 MIL/uL (ref 3.87–5.11)
RDW: 15.3 % (ref 11.5–15.5)
WBC: 5.5 10*3/uL (ref 4.0–10.5)
nRBC: 0.7 % — ABNORMAL HIGH (ref 0.0–0.2)

## 2023-02-16 LAB — BLOOD GAS, ARTERIAL
Acid-Base Excess: 2.2 mmol/L — ABNORMAL HIGH (ref 0.0–2.0)
Bicarbonate: 25.1 mmol/L (ref 20.0–28.0)
Drawn by: 23532
FIO2: 0.5 %
O2 Content: 35 L/min
O2 Saturation: 99.7 %
Patient temperature: 36.9
pCO2 arterial: 33 mm[Hg] (ref 32–48)
pH, Arterial: 7.49 — ABNORMAL HIGH (ref 7.35–7.45)
pO2, Arterial: 98 mm[Hg] (ref 83–108)

## 2023-02-16 LAB — RENAL FUNCTION PANEL
Albumin: 2.2 g/dL — ABNORMAL LOW (ref 3.5–5.0)
Anion gap: 8 (ref 5–15)
BUN: 14 mg/dL (ref 8–23)
CO2: 21 mmol/L — ABNORMAL LOW (ref 22–32)
Calcium: 7.4 mg/dL — ABNORMAL LOW (ref 8.9–10.3)
Chloride: 104 mmol/L (ref 98–111)
Creatinine, Ser: 0.67 mg/dL (ref 0.44–1.00)
GFR, Estimated: >60 mL/min (ref >60)
Glucose, Bld: 118 mg/dL — ABNORMAL HIGH (ref 70–99)
Phosphorus: 1.7 mg/dL — ABNORMAL LOW (ref 2.5–4.6)
Potassium: 4.6 mmol/L (ref 3.5–5.1)
Sodium: 133 mmol/L — ABNORMAL LOW (ref 135–145)

## 2023-02-16 LAB — LACTIC ACID, PLASMA: Lactic Acid, Venous: 2.4 mmol/L (ref 0.5–1.9)

## 2023-02-16 LAB — BODY FLUID CULTURE W GRAM STAIN: Special Requests: NORMAL

## 2023-02-16 LAB — CREATININE, SERUM
Creatinine, Ser: 0.65 mg/dL (ref 0.44–1.00)
GFR, Estimated: >60 mL/min (ref >60)

## 2023-02-16 MED ORDER — SODIUM PHOSPHATES 45 MMOLE/15ML IV SOLN
30.0000 mmol | Freq: Once | INTRAVENOUS | Status: AC
  Administered 2023-02-16: 30 mmol via INTRAVENOUS
  Filled 2023-02-16: qty 10

## 2023-02-16 MED ORDER — DEXTROSE-SODIUM CHLORIDE 5-0.9 % IV SOLN: INTRAVENOUS | Status: AC

## 2023-02-16 MED ORDER — PANTOPRAZOLE SODIUM 40 MG PO TBEC
40.0000 mg | DELAYED_RELEASE_TABLET | Freq: Every day | ORAL | Status: DC
  Administered 2023-02-16 – 2023-02-17 (×2): 40 mg via ORAL
  Filled 2023-02-16 (×4): qty 1

## 2023-02-16 MED ORDER — CEFAZOLIN SODIUM-DEXTROSE 2-4 GM/100ML-% IV SOLN
2.0000 g | Freq: Three times a day (TID) | INTRAVENOUS | Status: DC
  Administered 2023-02-16 – 2023-02-24 (×26): 2 g via INTRAVENOUS
  Filled 2023-02-16 (×27): qty 100

## 2023-02-16 MED ORDER — ENSURE ENLIVE PO LIQD: 237.0000 mL | Freq: Two times a day (BID) | ORAL | Status: DC

## 2023-02-16 NOTE — Progress Notes (Signed)
Progress Note   Patient: Anita Price ZOX:[id number] DOB: 03/31/1956 DOA: 02/13/2023     3 DOS: the patient was seen and examined on 02/16/2023 at 7:40AM      Brief hospital course: 67 y.o. F with cirrhosis, HTH, hx HE who presented with vomiting and decreased mentation, found to have empyema and sepsis.     Significant events: 11/14: Admitted on antibiotics, thoracentesis with purulent material 11/15: Pulmonology consulted, chest tube inserted 11/16: Breathing worse; Up to 35L HFNC; Lytics given 11/17: IR consulted for second chest tube    Significant studies: 11/14 CT head: NAD 11/14 CT chest: large left pleural effusion 11/15 Echo: Normal EF, normal valves   Significant microbiology data: 11/14 Bcx x1: NGTD 11/14 pleural fluid: Strep intermedius    Procedures: 11/14: Thoracentesis 11/15: Chest tube insertion 11/16: Pleural lytics    Consults: Pulmonology IR      Assessment and Plan: * Sepsis due to undetermined organism (HCC) Empyema P/w tachycardia, tachypnea, SOFA 4 (mental status, pao2/fio2, creatinine, bilirubin) and empyema.  Thoracentesis on 11/14 showed thick milky fluid, >70K nucleated cells, strep intermedius, pan sensitive - Continue cefazolin - Consult Pulmonology - Lytics given, suspect incomplete drainage due to second loculation; re-consult IR for second chest tube      Empyema (HCC) See above.  Not VATS candidate.   Acute metabolic encephalopathy Due to ongoing sepsis.  Oriented to self, place, but very weak, poor reserve, nothing focal - Lactulose and Xifaxan as able   Hypophosphatemia - Supplement phos  Thrombocytopenia (HCC) Plts 43K, unchanged, no clinical bleeding  Uncomplicated opioid dependence (HCC) - IV morphine as needed - Hold MS Contin until mentation better  Protein-calorie malnutrition, severe - Ensure when she is able to take p.o.  Hepatic cirrhosis in setting of untreated hepatitis C  Tbili 1.7, INR  2.9, Na 128, Cr 1.6 on admission, MELD 27 on admission Decompensated due to sepsis - Continue lactulose and Xifaxan - Hold diuretics - Outpatient GI follow-up - Trend INR  Essential hypertension BP improved  - Hold Coreg for now          Subjective: Overnight the patient had tachypnea, required high flow nasal cannula, but her breathing is better today.  She personally has no complaints, just feels "horrible", extremely tired, weak, listless.  She has no specific pain complaints, but her mentation is poor enough that she is not a reliable historian.  Nursing report her oral intake is very poor, she has had a few small bowel movements.  Pulmonology has consulted IR for second chest tube     Physical Exam: BP (!) 154/68   Pulse 99   Temp 97.6 F (36.4 C) (Oral)   Resp (!) 34   Ht 5\' 5"  (1.651 m)   Wt 46.9 kg   SpO2 96%   BMI 17.21 kg/m   Frail elderly female, lying bed, weak and tired, listless Tachycardic, regular, very subtle systolic murmur, no peripheral pitting Respiratory rate increased, lung sounds overall diminished Abdomen soft no grimace to palpation, no distention, no ascites Mentation decreased, affect blunted, oriented to self, "[name]", but after that, she mostly stops responding to questioning, and closes her eyes    Data Reviewed: ABG shows normal pH, normal CO2 and O2 on high flow nasal cannula Platelets 43, stable Lactic acid 2.4, this is not due to ongoing sepsis but from liver failure White blood cell count normal Sodium 133, creatinine normal, potassium 4.6, phosphate low  Family Communication: None present  Disposition: Status is: Inpatient         Author: Alberteen Sam, MD 02/16/2023 12:37 PM  For on call review [email or web address].

## 2023-02-16 NOTE — Plan of Care (Signed)
  Problem: Fluid Volume: Goal: Hemodynamic stability will improve Outcome: Progressing   Problem: Clinical Measurements: Goal: Diagnostic test results will improve Outcome: Progressing   Problem: Clinical Measurements: Goal: Diagnostic test results will improve Outcome: Progressing

## 2023-02-16 NOTE — Progress Notes (Addendum)
Pt RR reaching the 40-50's more frequently O2 SAT remains >95. Pt denies pain and feeling anxious. RT called and Elink notified. RT came by to adjust HHFNC (see RT notes). Pt had occasional episodes of tachypnea  a few2 hours ago. Elink was notified and MD said that he would monitor pt throughout night.

## 2023-02-16 NOTE — Assessment & Plan Note (Signed)
-   Supplement Phos again

## 2023-02-16 NOTE — Plan of Care (Signed)
Patient with left sided empyema s/p chest tube placement at bedside by CCM with lytic administration, suspect additional collection higher up which is not being drained by current chest tube. Request to IR for second apical chest tube placement.  Patient history and imaging reviewed by Dr. Lowella Dandy who approves procedure tentatively planned for 11/18 pending any emergent procedures.  Plan: - NPO at midnight - IR APP will see for consult/consent on 11/18  Please call on call IR MD with questions or concerns.  Lynnette Caffey, PA-C

## 2023-02-16 NOTE — Assessment & Plan Note (Signed)
Plts ~40K, unchanged, no clinical bleeding

## 2023-02-16 NOTE — Progress Notes (Signed)
   NAME:  Anita Price, MRN:  [id number], DOB:  October 23, 1955, LOS: 3 ADMISSION DATE:  02/13/2023, CONSULTATION DATE:  02/13/23 REFERRING MD:  Robb Matar - TRH, CHIEF COMPLAINT:  weakness6   History of Present Illness:  67 yo F PMH hep C, cirrhosis, HE, chronic pain, prolonged qtc who presented to [facility] ED 11/14 for 3-4d hx weakness + anorexia, n/v. In ED she was noted to be altered, unable to provide much reliable history. Had a CXR which showed L effusion. Underwent CT H c/a/p -- no intracranial abnormality, large L loculated pleural effusion, new thickening of endometrium, liver cirrhosis, splenomegaly, multiple splenic infarcts. Her labs revealed an AKI, meta cidosis, elevated LFTs, electrolyte disturbances, elevated ammonia, lactic acidosis, thrombocytopenia and coagulopathy. Mild leukocytosis. Started on vanc cefepime flagyl.  Underwent thora with IR, 1L milky purulent fluid off. Sent for cx. CXR post procedure with some residual effusion.  PCCM consulted in this setting, for pulm consult to see 11/15 AM.    In d/w daughter 11/15, this came out of nowhere a few days ago. Usually takes care of nephews and is functioning well w her chronic dz processes. Has had encephalopathy in the past r/t infections. Baseline opioid dependence -- follows w pain clinic in [place] & is considering a SCS trial   Pertinent  Medical History  Chronic pain DJD Prolonged qtc Protein calorie malnutrition Cirrhosis Hep C Hepatic encephalopathy   Significant Hospital Events: Including procedures, antibiotic start and stop dates in addition to other pertinent events   11/14 Admit to [facility]. IR thora.  11/15 pccm for chest tube, empyema LD> 10K, 71K TNC, 100 neutrophils and was turbid appearing    Interim History / Subjective:  No events, breathing pattern a little better today. Minimal chest tube output  Objective   Blood pressure (!) 154/68, pulse 99, temperature 97.9 F (36.6 C), temperature source Axillary, resp.  rate (!) 34, height 5\' 5"  (1.651 m), weight 46.9 kg, SpO2 96%.    FiO2 (%):  [45 %-70 %] 48 %   Intake/Output Summary (Last 24 hours) at 02/16/2023 0951 Last data filed at 02/16/2023 4742 Gross per 24 hour  Intake 2652.93 ml  Output 1025 ml  Net 1627.93 ml   Filed Weights   02/13/23 1700 02/13/23 2030  Weight: 49.9 kg 46.9 kg    Examination: Chronically ill Less accessory muscle use today Remains anxious with low pain threshold More alert Lung sounds remain diminished, US showing more apical complex appearing fluid collection  Phos being repleted Strep intermedius in pleural fluid Plts low but stable  Resolved Hospital Problem list     Assessment & Plan:   Strep intermedius empyema on left Acute encephalopathy 2/2 sepsis, likely HE- improving, no BM yet Chronic opiate dependent pain Dysphagia Frail Severe protein calorie malnutrition Hep C cirrhosis w/ splenic sequestration of platelets  Lytics not really working suspect we are dealing with a separate collection more apical  Abx x 4 weeks from source control  Will ask IR to place a new tube higher up to facilitate drainage, will likely need lytics in this new tube as well  She is not an operative candidate so will have to clear space as best we can percutaneously  Will check with nurse regarding BM and lactulose compliance  Will follow  Myrla Halsted MD PCCM

## 2023-02-16 NOTE — Progress Notes (Signed)
[facility] ADULT ICU REPLACEMENT PROTOCOL   The patient does apply for the [facility] Adult ICU Electrolyte Replacment Protocol based on the criteria listed below:   1.Exclusion criteria: TCTS, ECMO, Dialysis, and Myasthenia Gravis patients 2. Is GFR >/= 30 ml/min? Yes.    Patient's GFR today is >60 3. Is SCr </= 2? Yes.   Patient's SCr is 0.67 mg/dL 4. Did SCr increase >/= 0.5 in 24 hours? No. 5.Pt's weight >40kg  Yes.   6. Abnormal electrolyte(s): Phos  7. Electrolytes replaced per protocol 8.  Call MD STAT for K+ </= 2.5, Phos </= 1, or Mag </= 1 Physician:  Merryl Hacker [facility] 02/16/2023 5:36 AM

## 2023-02-16 NOTE — TOC Initial Note (Signed)
Transition of Care [facility]) - Initial/Assessment Note    Patient Details  Name: Anita Price MRN: [id number] Date of Birth: [birth year]  Transition of Care [facility]) CM/SW Contact:    Darleene Cleaver, LCSW Phone Number: 02/16/2023, 4:13 PM  Clinical Narrative:                  Patient is a 67 year old female who lives with her daughter.  Patient is alert and oriented x1.  Patient admitted for Sepsis per physician.  Patient currently on high flow nasal cannula.   Assessment completed via chart review due to patient's confusion.  During chart review, patient has received HH in April through [facility].  Patient has high readmission risk of 22%.  Readmission prevention screen completed.  SDOH reviewed,  no current SDOH needs identified.      Expected Discharge Plan: Home/Self Care Barriers to Discharge: Continued Medical Work up   Patient Goals and CMS Choice Patient states their goals for this hospitalization and ongoing recovery are:: To return back home with home health if needed.          Expected Discharge Plan and Services In-house Referral: Clinical Social Work   Post Acute Care Choice: Home Health Living arrangements for the past 2 months: Apartment                                      Prior Living Arrangements/Services Living arrangements for the past 2 months: Apartment Lives with:: Adult Children Patient language and need for interpreter reviewed:: Yes Do you feel safe going back to the place where you live?: Yes (Yes most likely return home.)      Need for Family Participation in Patient Care: Yes (Comment) Care giver support system in place?: No (comment)   Criminal Activity/Legal Involvement Pertinent to Current Situation/Hospitalization: No - Comment as needed  Activities of Daily Living   ADL Screening (condition at time of admission) Independently performs ADLs?: Yes (appropriate for developmental age) Is the patient deaf or have difficulty hearing?:  No Does the patient have difficulty seeing, even when wearing glasses/contacts?: No Does the patient have difficulty concentrating, remembering, or making decisions?: Yes  Permission Sought/Granted Permission sought to share information with : Case Manager, Family Supports Permission granted to share information with : Yes, Release of Information Signed, Yes, Verbal Permission Granted  Share Information with NAME: [name] Daughter [phone]  [id number]           Emotional Assessment Appearance:: Appears stated age Attitude/Demeanor/Rapport: Unable to Assess (Disoriented x3 only alert and oriented to person.) Affect (typically observed): Stable, Appropriate Orientation: : Oriented to Self Alcohol / Substance Use: Tobacco Use Psych Involvement: No (comment)  Admission diagnosis:  Sepsis due to undetermined organism [facility]) [A41.9] Patient Active Problem List   Diagnosis Date Noted   Hypophosphatemia 02/16/2023   Thrombocytopenia (HCC) 02/15/2023   Abnormal ultrasound of uterus 02/14/2023   Hypokalemia 02/14/2023   Uncomplicated opioid dependence (HCC) 02/14/2023   Empyema (HCC) 02/13/2023   AKI (acute kidney injury) (HCC) 02/13/2023   Acute metabolic encephalopathy 06/30/2021   Diarrhea 06/30/2021   Protein-calorie malnutrition, severe 07/12/2020   Hepatic cirrhosis in setting of untreated hepatitis C     Essential hypertension 07/10/2020   Hepatitis C 07/10/2020   Tobacco use 07/10/2020   Macrocytosis 07/10/2020   Hyponatremia 07/10/2020   Hyperglycemia 07/10/2020   Prolonged QT interval 07/10/2020   Sepsis  due to undetermined organism (HCC) 07/10/2020   Cervical radiculopathy 12/04/2014   Chronic pain syndrome due to chronic back pain  08/27/2011   PCP:  Jackie Plum, MD Pharmacy:   [facility] [id number] - [address], [place] - [address] [facility] [address] [facility] [address] [place] [address] Phone: [phone] Fax: [phone]     Social  Determinants of Health (SDOH) Social History: SDOH Screenings   Food Insecurity: No Food Insecurity (02/14/2023)  Housing: Low Risk  (02/14/2023)  Transportation Needs: No Transportation Needs (02/14/2023)  Utilities: Not At Risk (02/14/2023)  Social Connections: Unknown (08/01/2021)   Received from [facility]  Tobacco Use: High Risk (02/13/2023)   SDOH Interventions:     Readmission Risk Interventions    02/16/2023    3:49 PM 07/13/2020   11:39 AM  Readmission Risk Prevention Plan  Post Dischage Appt  Not Complete  Appt Comments  attempted to call PCP to schedule f/u- however office informed us that patient would need to call for appointment  Medication Screening  Complete  Transportation Screening Not Complete Complete  Transportation Screening Comment Patient only alert and orieted x1, unable to complete transportation screening.   PCP or Specialist Appt within 5-7 Days Not Complete   Not Complete comments Patient only alert and oriented x1 can't schedule an appt.   Home Care Screening Not Complete   Home Care Screening Not Completed Comments Alert and oriented x1 and not medically ready for HH.   Medication Review (RN CM) Referral to Pharmacy

## 2023-02-17 ENCOUNTER — Encounter (HOSPITAL_COMMUNITY): Payer: Self-pay | Admitting: Urology

## 2023-02-17 ENCOUNTER — Inpatient Hospital Stay (HOSPITAL_COMMUNITY): Payer: Medicare Other

## 2023-02-17 DIAGNOSIS — A419 Sepsis, unspecified organism: Secondary | ICD-10-CM | POA: Diagnosis not present

## 2023-02-17 DIAGNOSIS — E876 Hypokalemia: Secondary | ICD-10-CM

## 2023-02-17 DIAGNOSIS — G9341 Metabolic encephalopathy: Secondary | ICD-10-CM | POA: Diagnosis not present

## 2023-02-17 DIAGNOSIS — D696 Thrombocytopenia, unspecified: Secondary | ICD-10-CM

## 2023-02-17 DIAGNOSIS — E43 Unspecified severe protein-calorie malnutrition: Secondary | ICD-10-CM

## 2023-02-17 DIAGNOSIS — J869 Pyothorax without fistula: Secondary | ICD-10-CM | POA: Diagnosis not present

## 2023-02-17 DIAGNOSIS — E871 Hypo-osmolality and hyponatremia: Secondary | ICD-10-CM

## 2023-02-17 DIAGNOSIS — R935 Abnormal findings on diagnostic imaging of other abdominal regions, including retroperitoneum: Secondary | ICD-10-CM | POA: Diagnosis not present

## 2023-02-17 DIAGNOSIS — I10 Essential (primary) hypertension: Secondary | ICD-10-CM

## 2023-02-17 LAB — RENAL FUNCTION PANEL
Albumin: 2 g/dL — ABNORMAL LOW (ref 3.5–5.0)
Anion gap: 9 (ref 5–15)
BUN: 11 mg/dL (ref 8–23)
CO2: 19 mmol/L — ABNORMAL LOW (ref 22–32)
Calcium: 7 mg/dL — ABNORMAL LOW (ref 8.9–10.3)
Chloride: 106 mmol/L (ref 98–111)
Creatinine, Ser: 0.55 mg/dL (ref 0.44–1.00)
GFR, Estimated: >60 mL/min (ref >60)
Glucose, Bld: 109 mg/dL — ABNORMAL HIGH (ref 70–99)
Phosphorus: 2.1 mg/dL — ABNORMAL LOW (ref 2.5–4.6)
Potassium: 3.7 mmol/L (ref 3.5–5.1)
Sodium: 134 mmol/L — ABNORMAL LOW (ref 135–145)

## 2023-02-17 LAB — CBC WITH DIFFERENTIAL/PLATELET
Abs Immature Granulocytes: 0.09 10*3/uL — ABNORMAL HIGH (ref 0.00–0.07)
Basophils Absolute: 0 10*3/uL (ref 0.0–0.1)
Basophils Relative: 0 %
Eosinophils Absolute: 0 10*3/uL (ref 0.0–0.5)
Eosinophils Relative: 1 %
HCT: 35.5 % — ABNORMAL LOW (ref 36.0–46.0)
Hemoglobin: 11.9 g/dL — ABNORMAL LOW (ref 12.0–15.0)
Immature Granulocytes: 2 %
Lymphocytes Relative: 18 %
Lymphs Abs: 1 10*3/uL (ref 0.7–4.0)
MCH: 30.5 pg (ref 26.0–34.0)
MCHC: 33.5 g/dL (ref 30.0–36.0)
MCV: 91 fL (ref 80.0–100.0)
Monocytes Absolute: 0.4 10*3/uL (ref 0.1–1.0)
Monocytes Relative: 8 %
Neutro Abs: 3.8 10*3/uL (ref 1.7–7.7)
Neutrophils Relative %: 71 %
Platelets: 44 10*3/uL — ABNORMAL LOW (ref 150–400)
RBC: 3.9 MIL/uL (ref 3.87–5.11)
RDW: 15.6 % — ABNORMAL HIGH (ref 11.5–15.5)
WBC: 5.3 10*3/uL (ref 4.0–10.5)
nRBC: 0.4 % — ABNORMAL HIGH (ref 0.0–0.2)

## 2023-02-17 LAB — LACTIC ACID, PLASMA
Lactic Acid, Venous: 4.5 mmol/L (ref 0.5–1.9)
Lactic Acid, Venous: 5.5 mmol/L (ref 0.5–1.9)

## 2023-02-17 LAB — CYTOLOGY - NON PAP

## 2023-02-17 LAB — PROTIME-INR
INR: 2.3 — ABNORMAL HIGH (ref 0.8–1.2)
Prothrombin Time: 25.2 s — ABNORMAL HIGH (ref 11.4–15.2)

## 2023-02-17 MED ORDER — SODIUM CHLORIDE 0.9 % IV BOLUS
1000.0000 mL | Freq: Once | INTRAVENOUS | Status: AC
  Administered 2023-02-17: 1000 mL via INTRAVENOUS

## 2023-02-17 MED ORDER — MIDAZOLAM HCL 2 MG/2ML IJ SOLN
INTRAMUSCULAR | Status: AC | PRN
  Administered 2023-02-17: 1 mg via INTRAVENOUS

## 2023-02-17 MED ORDER — FENTANYL CITRATE (PF) 100 MCG/2ML IJ SOLN
INTRAMUSCULAR | Status: AC | PRN
  Administered 2023-02-17: 25 ug via INTRAVENOUS
  Administered 2023-02-17: 50 ug via INTRAVENOUS

## 2023-02-17 MED ORDER — LACTATED RINGERS IV BOLUS
500.0000 mL | Freq: Once | INTRAVENOUS | Status: AC
  Administered 2023-02-17: 500 mL via INTRAVENOUS

## 2023-02-17 MED ORDER — FLUMAZENIL 0.5 MG/5ML IV SOLN
INTRAVENOUS | Status: AC
  Filled 2023-02-17: qty 5

## 2023-02-17 MED ORDER — POTASSIUM CHLORIDE CRYS ER 20 MEQ PO TBCR
20.0000 meq | EXTENDED_RELEASE_TABLET | Freq: Once | ORAL | Status: AC
Start: 2023-02-17 — End: 2023-02-17
  Administered 2023-02-17: 20 meq via ORAL
  Filled 2023-02-17: qty 1

## 2023-02-17 MED ORDER — MIDAZOLAM HCL 2 MG/2ML IJ SOLN
INTRAMUSCULAR | Status: AC
  Filled 2023-02-17: qty 2

## 2023-02-17 MED ORDER — NALOXONE HCL 0.4 MG/ML IJ SOLN
INTRAMUSCULAR | Status: AC
  Filled 2023-02-17: qty 1

## 2023-02-17 MED ORDER — CARVEDILOL 3.125 MG PO TABS
3.1250 mg | ORAL_TABLET | Freq: Two times a day (BID) | ORAL | Status: DC
  Administered 2023-02-17 – 2023-02-19 (×4): 3.125 mg via ORAL
  Filled 2023-02-17 (×4): qty 1

## 2023-02-17 MED ORDER — FENTANYL CITRATE (PF) 100 MCG/2ML IJ SOLN
INTRAMUSCULAR | Status: AC
  Filled 2023-02-17: qty 2

## 2023-02-17 MED ORDER — POTASSIUM PHOSPHATES 15 MMOLE/5ML IV SOLN
15.0000 mmol | Freq: Once | INTRAVENOUS | Status: AC
Start: 2023-02-17 — End: 2023-02-17
  Administered 2023-02-17: 15 mmol via INTRAVENOUS
  Filled 2023-02-17: qty 5

## 2023-02-17 NOTE — Progress Notes (Signed)
NAME:  Anita Price, MRN:  [id number], DOB:  10-15-1955, LOS: 4 ADMISSION DATE:  02/13/2023, CONSULTATION DATE:  02/13/23 REFERRING MD:  Robb Matar - TRH, CHIEF COMPLAINT:  [id number]   History of Present Illness:  67 yo F PMH hep C, cirrhosis, HE, chronic pain, prolonged qtc who presented to [facility] ED 11/14 for 3-4d hx weakness + anorexia, n/v. In ED she was noted to be altered, unable to provide much reliable history. Had a CXR which showed L effusion. Underwent CT H c/a/p -- no intracranial abnormality, large L loculated pleural effusion, new thickening of endometrium, liver cirrhosis, splenomegaly, multiple splenic infarcts. Her labs revealed an AKI, meta cidosis, elevated LFTs, electrolyte disturbances, elevated ammonia, lactic acidosis, thrombocytopenia and coagulopathy. Mild leukocytosis. Started on vanc cefepime flagyl.  Underwent thora with IR, 1L milky purulent fluid off. Sent for cx. CXR post procedure with some residual effusion.  PCCM consulted in this setting, for pulm consult to see 11/15 AM.    In d/w daughter 11/15, this came out of nowhere a few days ago. Usually takes care of nephews and is functioning well w her chronic dz processes. Has had encephalopathy in the past r/t infections. Baseline opioid dependence -- follows w pain clinic in [place] & is considering a SCS trial   Pertinent  Medical History  Chronic pain DJD Prolonged qtc Protein calorie malnutrition Cirrhosis Hep C Hepatic encephalopathy   Significant Hospital Events: Including procedures, antibiotic start and stop dates in addition to other pertinent events   11/14 Admit to [facility]. IR thora.  11/15 pccm for chest tube, empyema LD> 10K, 71K TNC, 100 neutrophils and was turbid appearing   11/18 775 out from chest tube in the last 24hrs  Interim History / Subjective:  No issues overnight  CXR pending   Objective   Blood pressure (!) 164/84, pulse 95, temperature 98.2 F (36.8 C), temperature source Oral, resp.  rate (!) 30, height 5\' 5"  (1.651 m), weight 46.9 kg, SpO2 96%.    FiO2 (%):  [47 %] 47 %   Intake/Output Summary (Last 24 hours) at 02/17/2023 0950 Last data filed at 02/17/2023 0849 Gross per 24 hour  Intake 2714.34 ml  Output 1475 ml  Net 1239.34 ml   Filed Weights   02/13/23 1700 02/13/23 2030  Weight: 49.9 kg 46.9 kg    Examination: General: Acute on chronically ill appearing cachetic frail middle aged female lying in bed, in NAD HEENT: [place]/AT, MM pink/moist, PERRL,  Neuro: Alert to self only  CV: s1s2 regular rate and rhythm, no murmur, rubs, or gallops,  PULM: Diminished bilaterally, no increased work of breathing, remains on HHFNC  GI: soft, bowel sounds active in all 4 quadrants, non-tender, non-distended Extremities: warm/dry, no edema  Skin: no rashes or lesions   Resolved Hospital Problem list     Assessment & Plan:   Strep intermedius empyema on left Acute encephalopathy 2/2 sepsis Chronic opiate dependent pain Dysphagia Frail Severe protein calorie malnutrition Hep C cirrhosis w/ splenic sequestration of platelets -Lytics not really working suspect we are dealing with a separate collection more apical P: Continue prolong antibiotic course, x4 weeks  Routine chest tube care  Flush chest tube  Monitor output  Repeat CXR pending, output increased hope we can avoid a second chest tube  Bowel regiment    Travus Oren D. Harris, NP-C [facility] Personal contact information can be found on Amion  If no contact or response made please call [phone] 02/17/2023, 9:54  AM

## 2023-02-17 NOTE — Progress Notes (Signed)
02/17/2023 Some concern that the existing chest tube may be subdiaphragmatic.  It's draining well.  Would probably just leave for now and can take out when output drops off.  ?draining ascitic fluid? Would not make much sense.  No role for CT at this time as would not change management.  Discussed with RN.  Myrla Halsted MD PCCM

## 2023-02-17 NOTE — Progress Notes (Signed)
SLP Cancellation Note  Patient Details Name: Anita Price MRN: [id number] DOB: 1955/08/13   Cancelled treatment:       Reason Eval/Treat Not Completed: Other (comment) (Patient has been NPO for chest tube in IR. SLP will continue to follow.)   Angela Nevin, MA, CCC-SLP Speech Therapy

## 2023-02-17 NOTE — H&P (Addendum)
Chief Complaint: Patient was seen in consultation today for left-sided chest tube placement for left thorax empyema Chief Complaint  Patient presents with   Nausea   Emesis   Weakness   at the request of Dr. Adaline Sill  Supervising Physician: Roanna Banning  Patient Status: [facility] - In-pt  History of Present Illness: Anita Price is a 67 y.o. female   FULL Code status per patient and chart review.  Patient is know to IR service.  67 yo F PMH hep C, cirrhosis, HE, chronic pain, prolonged qtc who presented to [facility] ED 11/14 for 3-4d hx weakness + anorexia, n/v. In ED she was noted to be altered, unable to provide much reliable history.  Had a CXR which showed L effusion. Underwent CT H c/a/p -- large L loculated pleural effusion.   Underwent thora with IR on 11/14. 1L milky purulent fluid off. Cx positive for Strep. intermedius.  PCCM consulted in this setting, for pulm consult to see 11/15 AM.  Patient with left sided empyema s/p chest tube placement at bedside by CCM with lytic administration, suspect additional collection higher up which is not being drained by current chest tube.   Request to IR for second apical chest tube placement.  CXR on 11/17: FINDINGS: Large left-sided pleural effusion much of which is loculated left basilar chest tube is stable finding right lung clear. No pneumothorax. Aorta is calcified.   IMPRESSION: Persistent large left-sided effusion.  CXR today: FINDINGS: Stable position of left-sided chest tube. Stable probably loculated left pleural effusion.   IMPRESSION: Stable left-sided chest tube and probably loculated left pleural effusion.  CT Chest wo today pending IR review.   Patient history and imaging reviewed by Dr. Lowella Dandy who approves procedure tentatively planned for 11/18 pending any emergent procedures. Dr. Milford Cage in agreement.    Past Medical History:  Diagnosis Date   Acute metabolic encephalopathy    Chronic back pain     DJD (degenerative joint disease)    Essential hypertension 07/10/2020   Gallbladder sludge    Hepatitis C 07/10/2020   Liver cirrhosis (HCC)    Pain management    Prolonged QT interval    Protein calorie malnutrition (HCC)    Thyroid nodule    Tobacco use 07/10/2020    Past Surgical History:  Procedure Laterality Date   BACK SURGERY     btl     HERNIA REPAIR     WRIST SURGERY      Allergies: Celecoxib, Duloxetine hcl, Tizanidine, Venlafaxine, Effersyllium [psyllium], Escitalopram, Neurontin [gabapentin], and Tramadol  Medications: Prior to Admission medications   Medication Sig Start Date End Date Taking? Authorizing Provider  morphine (MS CONTIN) 30 MG 12 hr tablet Take 30 mg by mouth every 8 (eight) hours.   Yes [provider]  pregabalin (LYRICA) 300 MG capsule Take 300 mg by mouth in the morning and at bedtime. 01/03/20  Yes [provider]  carvedilol (COREG) 3.125 MG tablet Take 1 tablet (3.125 mg total) by mouth 2 (two) times daily with a meal. Patient not taking: Reported on 02/13/2023 07/03/21   Leroy Sea, MD  cyclobenzaprine (FLEXERIL) 10 MG tablet Take 1 tablet (10 mg total) by mouth at bedtime as needed for muscle spasms. Patient not taking: Reported on 02/13/2023 07/03/21   Leroy Sea, MD  morphine (MS CONTIN) 15 MG 12 hr tablet Take 1 tablet (15 mg total) by mouth every 12 (twelve) hours. Patient not taking: Reported on 02/13/2023 07/03/21  Leroy Sea, MD     Family History  Problem Relation Age of Onset   Scoliosis Mother     Social History   Socioeconomic History   Marital status: Single    Spouse name: Not on file   Number of children: Not on file   Years of education: Not on file   Highest education level: Not on file  Occupational History   Not on file  Tobacco Use   Smoking status: Every Day    Current packs/day: 0.50    Types: Cigarettes   Smokeless tobacco: Never  Vaping Use   Vaping status: Never Used   Substance and Sexual Activity   Alcohol use: No    Comment: used to drink wine on regular basis but not in the past 20 years-as per chart notes 06/2020   Drug use: Not Currently    Comment: 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   Sexual activity: Not on file  Other Topics Concern   Not on file  Social History Narrative   Not on file   Social Determinants of Health   Financial Resource Strain: Not on file  Food Insecurity: No Food Insecurity (02/14/2023)   Hunger Vital Sign    Worried About Running Out of Food in the Last Year: Never true    Ran Out of Food in the Last Year: Never true  Transportation Needs: No Transportation Needs (02/14/2023)   PRAPARE - Administrator, Civil Service (Medical): No    Lack of Transportation (Non-Medical): No  Physical Activity: Not on file  Stress: Not on file  Social Connections: Unknown (08/01/2021)   Received from [facility]   Social Network    Social Network: Not on file    Review of Systems: A 12 point ROS discussed and pertinent positives are indicated in the HPI above.  All other systems are negative.  Review of Systems  Constitutional:  Positive for activity change and fatigue.  Respiratory:  Positive for chest tightness and shortness of breath.   Cardiovascular:  Negative for chest pain.  Skin:  Negative for color change.  Psychiatric/Behavioral:  Negative for behavioral problems and confusion.     Vital Signs: BP (!) 164/84   Pulse 95   Temp 98.1 F (36.7 C) (Oral)   Resp (!) 30   Ht 5\' 5"  (1.651 m)   Wt 103 lb 6.3 oz (46.9 kg)   SpO2 96%   BMI 17.21 kg/m   Advance Care Plan: The advanced care plan/surrogate decision maker was discussed at the time of visit and documented in the medical record.    Physical Exam Constitutional:      General: She is not in acute distress.    Appearance: Normal appearance.  HENT:     Mouth/Throat:     Mouth: Mucous membranes are dry.  Cardiovascular:     Rate and  Rhythm: Normal rate and regular rhythm.     Heart sounds: No murmur heard. Pulmonary:     Effort: No respiratory distress.     Breath sounds: Normal breath sounds.  Skin:    General: Skin is warm and dry.  Neurological:     Mental Status: She is alert and oriented to person, place, and time.  Psychiatric:        Behavior: Behavior normal.        Judgment: Judgment normal.     Imaging: DG Chest 1 View  Result Date: 02/17/2023 CLINICAL DATA:  Chest tube. EXAM: CHEST  1 VIEW COMPARISON:  February 16, 2023. FINDINGS: Stable position of left-sided chest tube. Stable probably loculated left pleural effusion. IMPRESSION: Stable left-sided chest tube and probably loculated left pleural effusion. Electronically Signed   By: Lupita Raider M.D.   On: 02/17/2023 11:30   DG Chest 1 View  Result Date: 02/16/2023 CLINICAL DATA:  Pleural effusion EXAM: CHEST  1 VIEW COMPARISON:  02/15/2023. FINDINGS: Large left-sided pleural effusion much of which is loculated left basilar chest tube is stable finding right lung clear. No pneumothorax. Aorta is calcified. IMPRESSION: Persistent large left-sided effusion. Electronically Signed   By: Layla Maw M.D.   On: 02/16/2023 08:14   DG Chest Port 1 View  Result Date: 02/15/2023 CLINICAL DATA:  Shortness of breath. EXAM: PORTABLE CHEST 1 VIEW COMPARISON:  Earlier chest radiograph dated 02/15/2023. FINDINGS: Left-sided chest tube in similar position. No significant interval change in the size of left pleural effusion since the earlier radiograph. The right lung is clear. No pneumothorax. Stable cardiac silhouette. No acute osseous pathology. IMPRESSION: No significant interval change since the earlier radiograph. Electronically Signed   By: Elgie Collard M.D.   On: 02/15/2023 17:58   DG Chest 1 View  Result Date: 02/15/2023 CLINICAL DATA:  67 year old female with history of chest tube. EXAM: CHEST  1 VIEW COMPARISON:  Chest x-ray 02/14/2023.  FINDINGS: Left-sided chest tube again noted with pigtail reformed projecting over the lower left hemithorax. Persistent large loculated pleural effusion, similar to the prior study. Atelectasis and/or consolidation in the base of the left lung. Right lung is clear. No right pleural effusion. No evidence of pulmonary edema. Heart size is normal. Upper mediastinal contours are within normal limits. IMPRESSION: 1. Stable position of left chest tube with large loculated left pleural effusion, similar to the prior study. Atelectasis and/or consolidation in the left lung base. Electronically Signed   By: Trudie Reed M.D.   On: 02/15/2023 08:30   ECHOCARDIOGRAM COMPLETE  Result Date: 02/14/2023    ECHOCARDIOGRAM REPORT   Patient Name:   Anita Price Date of Exam: 02/14/2023 Medical Rec #:  [id number]      Height:       65.0 in Accession #:    [id number]     Weight:       103.4 lb Date of Birth:  [birth year]       BSA:          1.494 m Patient Age:    67 years       BP:           115/48 mmHg Patient Gender: F              HR:           87 bpm. Exam Location:  Inpatient Procedure: 2D Echo, Color Doppler and Cardiac Doppler Indications:    Abnormal ECG  History:        Patient has no prior history of Echocardiogram examinations.                 Sepsis, Empyema, Hepatitis C; Risk Factors:Hypertension and                 Current Smoker.  Sonographer:    Milbert Coulter Referring Phys: [id number] DAVID MANUEL ORTIZ IMPRESSIONS  1. Left ventricular ejection fraction, by estimation, is 60 to 65%. The left ventricle has normal function. The left ventricle has no regional wall motion abnormalities. Left ventricular diastolic parameters  were normal.  2. Right ventricular systolic function is normal. The right ventricular size is normal.  3. The mitral valve is normal in structure. No evidence of mitral valve regurgitation. No evidence of mitral stenosis.  4. The aortic valve is normal in structure. Aortic valve regurgitation is  not visualized. No aortic stenosis is present.  5. The inferior vena cava is normal in size with greater than 50% respiratory variability, suggesting right atrial pressure of 3 mmHg. FINDINGS  Left Ventricle: Left ventricular ejection fraction, by estimation, is 60 to 65%. The left ventricle has normal function. The left ventricle has no regional wall motion abnormalities. The left ventricular internal cavity size was normal in size. There is  no left ventricular hypertrophy. Left ventricular diastolic parameters were normal. Normal left ventricular filling pressure. Right Ventricle: The right ventricular size is normal. No increase in right ventricular wall thickness. Right ventricular systolic function is normal. Left Atrium: Left atrial size was normal in size. Right Atrium: Right atrial size was normal in size. Pericardium: There is no evidence of pericardial effusion. Mitral Valve: The mitral valve is normal in structure. No evidence of mitral valve regurgitation. No evidence of mitral valve stenosis. Tricuspid Valve: The tricuspid valve is normal in structure. Tricuspid valve regurgitation is trivial. No evidence of tricuspid stenosis. Aortic Valve: The aortic valve is normal in structure. Aortic valve regurgitation is not visualized. No aortic stenosis is present. Aortic valve mean gradient measures 3.0 mmHg. Aortic valve peak gradient measures 6.0 mmHg. Aortic valve area, by VTI measures 2.29 cm. Pulmonic Valve: The pulmonic valve was normal in structure. Pulmonic valve regurgitation is not visualized. No evidence of pulmonic stenosis. Aorta: The aortic root is normal in size and structure. Venous: The inferior vena cava is normal in size with greater than 50% respiratory variability, suggesting right atrial pressure of 3 mmHg. IAS/Shunts: The interatrial septum appears to be lipomatous. No atrial level shunt detected by color flow Doppler.  LEFT VENTRICLE PLAX 2D LVIDd:         3.70 cm   Diastology LVIDs:          2.90 cm   LV e' medial:    9.36 cm/s LV PW:         0.90 cm   LV E/e' medial:  6.8 LV IVS:        0.90 cm   LV e' lateral:   10.80 cm/s LVOT diam:     1.80 cm   LV E/e' lateral: 5.9 LV SV:         41 LV SV Index:   27 LVOT Area:     2.54 cm  RIGHT VENTRICLE RV Basal diam:  2.70 cm RV Mid diam:    2.10 cm RV S prime:     15.20 cm/s TAPSE (M-mode): 2.2 cm LEFT ATRIUM             Index        RIGHT ATRIUM          Index LA diam:        2.50 cm 1.67 cm/m   RA Area:     8.24 cm LA Vol (A2C):   33.8 ml 22.62 ml/m  RA Volume:   13.60 ml 9.10 ml/m LA Vol (A4C):   14.1 ml 9.43 ml/m LA Biplane Vol: 21.9 ml 14.65 ml/m  AORTIC VALVE AV Area (Vmax):    2.13 cm AV Area (Vmean):   1.93 cm AV Area (VTI):  2.29 cm AV Vmax:           122.00 cm/s AV Vmean:          84.200 cm/s AV VTI:            0.178 m AV Peak Grad:      6.0 mmHg AV Mean Grad:      3.0 mmHg LVOT Vmax:         102.00 cm/s LVOT Vmean:        63.800 cm/s LVOT VTI:          0.160 m LVOT/AV VTI ratio: 0.90  AORTA Ao Root diam: 3.20 cm MITRAL VALVE               TRICUSPID VALVE MV Area (PHT): 3.27 cm    TR Peak grad:   24.2 mmHg MV Decel Time: 232 msec    TR Vmax:        246.00 cm/s MV E velocity: 63.40 cm/s MV A velocity: 64.70 cm/s  SHUNTS MV E/A ratio:  0.98        Systemic VTI:  0.16 m                            Systemic Diam: 1.80 cm Armanda Magic MD Electronically signed by Armanda Magic MD Signature Date/Time: 02/14/2023/4:01:03 PM    Final    DG Chest 1 View  Result Date: 02/14/2023 CLINICAL DATA:  Chest tube placement EXAM: CHEST  1 VIEW COMPARISON:  Yesterday FINDINGS: Cervical spine fixation. Midline trachea. Normal heart size. Left pigtail pleural catheter has been placed. The left-sided pleural effusion with loculation is increased, moderate. No pneumothorax. Clear right lung. Left lower lung airspace disease is increased. Asymmetric left-sided interstitial edema. IMPRESSION: Left pigtail pleural catheter in place with increase in  loculated moderate left pleural effusion. Interstitial edema persists. Left mid and lower lung airspace disease is increased. Electronically Signed   By: Jeronimo Greaves M.D.   On: 02/14/2023 15:39   DG Chest 1 View  Result Date: 02/13/2023 CLINICAL DATA:  Weakness, nausea, vomiting, status post thoracentesis EXAM: CHEST  1 VIEW COMPARISON:  Radiograph and CT earlier today FINDINGS: Decreased left pleural effusion after thoracentesis compared with radiographs earlier today. Small residual portable effusion. Improved aeration of the left lung with residual ground-glass opacities and atelectasis. The right lung is clear. Stable cardiomediastinal silhouette. No pneumothorax. IMPRESSION: Decreased left pleural effusion after thoracentesis. No pneumothorax. Electronically Signed   By: Minerva Fester M.D.   On: 02/13/2023 20:25   US THORACENTESIS ASP PLEURAL SPACE W/IMG GUIDE  Result Date: 02/13/2023 INDICATION: Patient with history of cirrhosis, hepatitis-C, encephalopathy, weakness, loculated left pleural effusion; request received for diagnostic and therapeutic left thoracentesis. EXAM: ULTRASOUND GUIDED DIAGNOSTIC AND THERAPEUTIC LEFT THORACENTESIS MEDICATIONS: 8 mL 1% lidocaine COMPLICATIONS: None immediate. PROCEDURE: An ultrasound guided thoracentesis was thoroughly discussed with the patient/daughter and questions answered. The benefits, risks, alternatives and complications were also discussed. The patient/daughter understands and wishes to proceed with the procedure. Written consent was obtained. Ultrasound was performed to localize and mark an adequate pocket of fluid in the left chest. The area was then prepped and draped in the normal sterile fashion. 1% Lidocaine was used for local anesthesia. Under ultrasound guidance a 6 Fr Safe-T-Centesis catheter was introduced. Thoracentesis was performed. The catheter was removed and a dressing applied. FINDINGS: A total of approximately 1 liter of purulent,  cream colored/milky fluid was removed. Samples were  sent to the laboratory as requested by the clinical team. IMPRESSION: Successful ultrasound guided diagnostic and therapeutic left thoracentesis yielding 1 liter of pleural fluid. Performed by: Artemio Aly Electronically Signed   By: Marliss Coots M.D.   On: 02/13/2023 16:38   CT CHEST ABDOMEN PELVIS W CONTRAST  Result Date: 02/13/2023 CLINICAL DATA:  Sepsis weakness, nausea and vomiting for 3-4 days. EXAM: CT CHEST, ABDOMEN, AND PELVIS WITH CONTRAST TECHNIQUE: Multidetector CT imaging of the chest, abdomen and pelvis was performed following the standard protocol during bolus administration of intravenous contrast. RADIATION DOSE REDUCTION: This exam was performed according to the departmental dose-optimization program which includes automated exposure control, adjustment of the mA and/or kV according to patient size and/or use of iterative reconstruction technique. CONTRAST:  60mL OMNIPAQUE IOHEXOL 300 MG/ML  SOLN COMPARISON:  CT scan abdomen and pelvis from 07/01/2021 and CT scan chest from 07/10/2020 FINDINGS: CT CHEST FINDINGS Cardiovascular: Normal cardiac size. No pericardial effusion. No aortic aneurysm. Mediastinum/Nodes: Redemonstration of an exophytic heterogeneous centrally hypoattenuating and peripherally hyperattenuating walled nodule arising from the inferior aspect of the left thyroid lobe measuring 1.7 x 1.8 cm. This is incompletely characterized on the current examination but appears unchanged since the prior study from 2022. Please refer to ultrasound thyroid report from April 2022 for additional details. No solid / cystic mediastinal masses. The esophagus is nondistended precluding optimal assessment. No axillary, mediastinal or hilar lymphadenopathy by size criteria. Lungs/Pleura: The trachea and right bronchial tree is patent. There is complete opacification of left bronchial tree with soft tissue attenuation areas and multiple small  foci of air. There is large left pleural effusion with sharp margins, favoring loculated pleural effusion. There is mild smooth thickening of the costal pleura. There also 2 small patches of loculated pleural effusion along the medial aspect. Findings favor chronic loculated pleural effusion than empyema. There is associated complete collapse of the left lower lobe and lingular segments of left upper lobe. There is partial aeration of the remaining left upper lobe. No suspicious mass or abscess seen within the collapsed lung. There are dependent changes in the right lung. Minimal centrilobular emphysematous changes also noted in the right lung. Right lung is otherwise clear. No mass, consolidation or pleural effusion. Musculoskeletal: The visualized soft tissues of the chest wall are grossly unremarkable. No suspicious osseous lesions. There are mild multilevel degenerative changes in the visualized spine. CT ABDOMEN PELVIS FINDINGS Hepatobiliary: The liver is normal in size. There is liver surface irregularity/nodularity, compatible with cirrhosis. No discrete suspicious lesion seen on this single phase exam. There are at least 2, subcentimeter, hypoattenuating foci in the right hepatic lobe, which are too small to adequately characterize. No intrahepatic or extrahepatic bile duct dilation. No calcified gallstones. Normal gallbladder wall thickness. No pericholecystic inflammatory changes. Pancreas: Unremarkable. No pancreatic ductal dilatation or surrounding inflammatory changes. Spleen: There are at least 3, predominantly peripheral wedge-shaped hypoattenuating areas within the spleen, compatible with splenic infarctions. There is an additional subcentimeter sized hyperattenuating focus along the inferior tip, which is incompletely characterized on the current exam but favored to represent a hemangioma. The spleen is mildly enlarged measuring up to 12.9 cm in length, decreased in size since the prior study from  2022, when it measured up to 14.2 cm. Adrenals/Urinary Tract: Adrenal glands are unremarkable. No suspicious renal mass. No hydronephrosis. No renal or ureteric calculi. Unremarkable urinary bladder. Stomach/Bowel: No disproportionate dilation of the small or large bowel loops. No evidence of abnormal bowel wall thickening or  inflammatory changes. The appendix is unremarkable. Vascular/Lymphatic: No ascites or pneumoperitoneum. No abdominal or pelvic lymphadenopathy, by size criteria. No aneurysmal dilation of the major abdominal arteries. There are mild peripheral atherosclerotic vascular calcifications of the aorta and its major branches. There are multiple venous collaterals near the fundus/cardia of the stomach, compatible with sequela of portal hypertension. Reproductive: There is small anteverted uterus. However, there is new endometrial thickening measuring up to 10-11 mm. This is abnormal for the patient of this age group. Clinical correlation and further evaluation with nonemergent pelvic ultrasound is recommended. No large adnexal mass seen. Bilateral fallopian tube closure device noted. Other: There are bilateral small fat containing inguinal hernias. The soft tissues and abdominal wall are otherwise unremarkable. Musculoskeletal: No suspicious osseous lesions. There are mild - moderate multilevel degenerative changes in the visualized spine. Posterior spinal fixation of L4-5 noted with transpedicular screws and rods. IMPRESSION: 1. There is large loculated left pleural effusion with associated complete collapse of the left lung lower lobe and lingular segment. 2. 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 3. Normal-size anteverted uterus however, there is new thickening of the endometrium measuring up to 10-11 mm. Clinical correlation and further evaluation with nonemergent pelvic ultrasound is recommended. 4. Cirrhotic liver configuration with mild splenomegaly and multiple venous collaterals near the  fundus/cardia of the stomach. No ascites. 5. Multiple other nonacute observations, as described above. Electronically Signed   By: Jules Schick M.D.   On: 02/13/2023 13:18   CT Head Wo Contrast  Result Date: 02/13/2023 CLINICAL DATA:  Mental status change.  Weakness. EXAM: CT HEAD WITHOUT CONTRAST TECHNIQUE: Contiguous axial images were obtained from the base of the skull through the vertex without intravenous contrast. RADIATION DOSE REDUCTION: This exam was performed according to the departmental dose-optimization program which includes automated exposure control, adjustment of the mA and/or kV according to patient size and/or use of iterative reconstruction technique. COMPARISON:  CT scan head from 06/30/2021. FINDINGS: Brain: No evidence of acute infarction, hemorrhage, hydrocephalus, extra-axial collection or mass lesion/mass effect. Ventricles are normal. Cerebral volume is age appropriate. Vascular: No hyperdense vessel or unexpected calcification. Skull: Normal. Negative for fracture or focal lesion. Sinuses/Orbits: No acute finding. Other: Visualized mastoid air cells are unremarkable. No mastoid effusion. IMPRESSION: *No acute intracranial abnormality. Electronically Signed   By: Jules Schick M.D.   On: 02/13/2023 12:57   DG Chest Port 1 View  Result Date: 02/13/2023 CLINICAL DATA:  Altered mental status. EXAM: PORTABLE CHEST 1 VIEW COMPARISON:  07/01/2021. FINDINGS: There is near complete opacification of left hemithorax without significant mediastinal shift. There is aeration of portion of the left upper mid lung zones. Findings may represent combination of left lung atelectasis and/or consolidation with probable loculated large pleural effusion. Right lung fields and right lateral costophrenic angle are clear. No pneumothorax on either side. Evaluation of cardiomediastinal silhouette is nondiagnostic due to left hemithorax opacification. No acute osseous abnormalities. The soft tissues are  within normal limits. IMPRESSION: *Near complete opacification of left hemithorax, as discussed above. Electronically Signed   By: Jules Schick M.D.   On: 02/13/2023 12:54    Labs:  CBC: Recent Labs    02/14/23 0649 02/15/23 0239 02/16/23 0330 02/17/23 0255  WBC 3.7* 4.8 5.5 5.3  HGB 11.0* 11.8* 12.0 11.9*  HCT 32.6* 35.9* 36.0 35.5*  PLT 52* 41* 43* 44*    COAGS: Recent Labs    02/13/23 1022 02/13/23 1746 02/14/23 0847 02/17/23 0255  INR 2.0* 2.9* 2.3* 2.3*  BMP: Recent Labs    02/14/23 0309 02/15/23 0239 02/16/23 0330 02/16/23 0331 02/17/23 0255  NA 130* 131*  --  133* 134*  K 3.3* 4.1  --  4.6 3.7  CL 97* 99  --  104 106  CO2 24 23  --  21* 19*  GLUCOSE 84 118*  --  118* 109*  BUN 30* 22  --  14 11  CALCIUM 7.5* 7.4*  --  7.4* 7.0*  CREATININE 0.88 0.80 0.65 0.67 0.55  GFRNONAA >60 >60 >60 >60 >60    LIVER FUNCTION TESTS: Recent Labs    02/13/23 1022 02/14/23 0309 02/15/23 0239 02/16/23 0331 02/17/23 0255  BILITOT 1.5* 1.7*  --   --   --   AST 203* 100*  --   --   --   ALT 79* 48*  --   --   --   ALKPHOS 163* 106  --   --   --   PROT 6.7 5.2*  --   --   --   ALBUMIN 2.5* 2.3* 2.1* 2.2* 2.0*    TUMOR MARKERS: No results for input(s): "AFPTM", "CEA", "CA199", "CHROMGRNA" in the last 8760 hours.  Assessment and Plan:  Patient with left sided empyema s/p chest tube placement at bedside by CCM on 11/15 with lytic administration. Now suspecting additional collection higher up which is not being drained by current chest tube.   On CXR 11/17, large left-sided pleural effusion much of which is loculated left basilar chest tube is stable finding right lung clear. CT Chest wo pending IR review.  Patient presents for left-sided chest tube placement. Risks and benefits of chest tube placement were discussed with the patient including bleeding, infection, damage to adjacent structures, malfunction of the tube requiring additional procedures and  sepsis.  All of the patient's questions were answered, patient is agreeable to proceed. Consent signed and in chart.     Thank you for this interesting consult.  I greatly enjoyed meeting SOMYA STEINBRINK and look forward to participating in their care.  A copy of this report was sent to the requesting provider on this date.  Electronically Signed: Sable Feil, PA-C 02/17/2023, 12:22 PM   I spent a total of 20 Minutes    in face to face in clinical consultation, greater than 50% of which was counseling/coordinating care for left-sided chest tube placement

## 2023-02-17 NOTE — Progress Notes (Signed)
[facility] ADULT ICU REPLACEMENT PROTOCOL   The patient does apply for the [facility] Adult ICU Electrolyte Replacment Protocol based on the criteria listed below:   1.Exclusion criteria: TCTS, ECMO, Dialysis, and Myasthenia Gravis patients 2. Is GFR >/= 30 ml/min? Yes.    Patient's GFR today is >60 3. Is SCr </= 2? Yes.   Patient's SCr is 0.55 mg/dL 4. Did SCr increase >/= 0.5 in 24 hours? No. 5.Pt's weight >40kg  Yes.   6. Abnormal electrolyte(s): Phos, K  7. Electrolytes replaced per protocol 8.  Call MD STAT for K+ </= 2.5, Phos </= 1, or Mag </= 1 Physician:  Alpha Gula [facility] 02/17/2023 4:19 AM

## 2023-02-17 NOTE — Procedures (Signed)
Vascular and Interventional Radiology Procedure Note  Patient: JONNAE MURAI DOB: 14-Apr-1955 Medical Record Number: [id number] Note Date/Time: 02/17/23 2:48 PM   Performing Physician: Roanna Banning, MD Assistant(s): None  Diagnosis: Loculated LEFT pleural effusion  Procedure:  LEFT PERCUTANEOUS THORACOSTOMY DRAINAGE CATHETER PLACEMENT for fibrinolysis  Anesthesia: Conscious Sedation Complications: None Estimated Blood Loss: Minimal Specimens: Sent for Gram Stain, Aerobe Culture, and Anerobe Culture  Findings:  Successful CT-guided placement of 14 F catheter into LEFT chest.  Plan:  - Fibrinolysis per Pulmonary team. - Chest tube to -20 cm H20 suction. Additional management per Primary.  See detailed procedure note with images in PACS. The patient tolerated the procedure well without incident or complication and was returned to Recovery in stable condition.    Roanna Banning, MD [facility]   Pager. [phone] Clinic. [phone]

## 2023-02-17 NOTE — Progress Notes (Signed)
PT not wearing HHFNC properly and keeps pulling out of her nose.  Pt sats are holding well.  Pt transitioned to [place] by RN.  Pt currently on 4L [place] with sats of 96%, RR 22.  Pt is tolerating well.

## 2023-02-17 NOTE — Progress Notes (Signed)
Progress Note   Patient: Anita Price:[id number] DOB: [birth year] DOA: 02/13/2023     4 DOS: the patient was seen and examined on 02/17/2023 at 8:55 AM      Brief hospital course: 67 y.o. F with cirrhosis, HTH, hx HE who presented with vomiting and decreased mentation, found to have empyema and sepsis.     Significant events: 11/14: Admitted on antibiotics, thoracentesis with purulent material 11/15: Pulmonology consulted, chest tube inserted 11/16: Breathing worse; Up to 35L HFNC; Lytics given 11/17: IR consulted for second chest tube 11/18: Second chest tube placed    Significant studies: 11/14 CT head: NAD 11/14 CT chest: large left pleural effusion 11/15 Echo: Normal EF, normal valves 11/18 CT chest repeat:    Significant microbiology data: 11/14 Bcx x1: NGTD 11/14 pleural fluid: Strep intermedius    Procedures: 11/14: Thoracentesis 11/15: Chest tube insertion 11/16: Pleural lytics    Consults: Pulmonology IR      Assessment and Plan: * Sepsis due to Streptococcus intermedius empyema (HCC) Empyema P/w tachycardia, tachypnea, SOFA 4 (mental status, pao2/fio2, creatinine, bilirubin) and empyema.  Thoracentesis on 11/14 showed thick milky fluid, >70K nucleated cells, strep intermedius, pan sensitive Chest tube placed 11/15 - Continue cefazolin - Consult Pulmonology - Second chest tube today      Empyema [facility]) Chest tube placed 11/15 Lytics #1 administered 11/16 Second chest tube planned 11/18 Not VATS candidate - Consult Pulmonology  Acute metabolic encephalopathy Due to ongoing sepsis.  Persists but seems better today. - Lactulose and Xifaxan as able    Hypophosphatemia - Supplement Phos again  Thrombocytopenia (HCC) Plts ~40K, unchanged, no clinical bleeding  Uncomplicated opioid dependence (HCC) - IV morphine as needed - Hold MS Contin until mentation better  Hypokalemia Resolved  Abnormal ultrasound of  uterus Incidental new endometrial thickening measuring up to 10-11 mm abnormal for this age group.  - Nonemergent pelvic ultrasound is recommended in outpatient setting  AKI (acute kidney injury) (HCC) Baseline Cr 0.8, up to 1.6 on admission, improved with fluids.  Protein-calorie malnutrition, severe - Ensure when she is able to take p.o.  Hepatic cirrhosis in setting of untreated hepatitis C  Tbili 1.7, INR 2.9, Na 128, Cr 1.6 on admission, MELD 27 on admission Decompensated due to sepsis INR 2.3, Tbili stable - Continue lactulose and Xifaxan - Hold diuretics - Outpatient GI follow-up - Trend INR  Prolonged QT interval Resolved  Hyponatremia Slightly low, asymptomatic   Essential hypertension BP elevated - Resume Coreg          Subjective: Patient states that she is not confused, but then can only state her name, not where she has her what month it is or why she is here.  Her oral intake has been poor.  She is weak and tired.  Nursing have weaned her oxygen down to 4 L.  She will go to IR for second chest tube this afternoon     Physical Exam: BP (!) 164/84   Pulse 95   Temp 98.1 F (36.7 C) (Oral)   Resp (!) 30   Ht 5\' 5"  (1.651 m)   Wt 46.9 kg   SpO2 96%   BMI 17.21 kg/m   Frail elderly female, lying in bed, listless and tired Tachycardic, regular, no peripheral pitting, no murmurs appreciated Respiratory rate fast, lung sounds overall diminished, shallow respirations, no rales appreciated Abdomen soft without grimace to palpation or guarding No distention or ascites Mentation poor, answers a few questions and falls asleep,  unable to answer most questions, oriented to self, not to place or month Severe generalized but symmetric weakness    Data Reviewed: Basic metabolic panel shows stable sodium, creatinine Phosphorus low INR 2.3 Platelets and hemoglobin stable  Family Communication: None present    Disposition: Status is:  Inpatient         Author: Alberteen Sam, MD 02/17/2023 2:48 PM  For on call review [email or web address].

## 2023-02-18 ENCOUNTER — Inpatient Hospital Stay (HOSPITAL_COMMUNITY): Payer: Medicare Other

## 2023-02-18 DIAGNOSIS — R4589 Other symptoms and signs involving emotional state: Secondary | ICD-10-CM

## 2023-02-18 DIAGNOSIS — G9341 Metabolic encephalopathy: Secondary | ICD-10-CM | POA: Diagnosis not present

## 2023-02-18 DIAGNOSIS — A419 Sepsis, unspecified organism: Secondary | ICD-10-CM | POA: Diagnosis not present

## 2023-02-18 DIAGNOSIS — K7460 Unspecified cirrhosis of liver: Secondary | ICD-10-CM

## 2023-02-18 DIAGNOSIS — E872 Acidosis, unspecified: Secondary | ICD-10-CM | POA: Insufficient documentation

## 2023-02-18 DIAGNOSIS — Z515 Encounter for palliative care: Secondary | ICD-10-CM | POA: Diagnosis not present

## 2023-02-18 DIAGNOSIS — R935 Abnormal findings on diagnostic imaging of other abdominal regions, including retroperitoneum: Secondary | ICD-10-CM | POA: Diagnosis not present

## 2023-02-18 DIAGNOSIS — E440 Moderate protein-calorie malnutrition: Secondary | ICD-10-CM | POA: Insufficient documentation

## 2023-02-18 DIAGNOSIS — J869 Pyothorax without fistula: Secondary | ICD-10-CM | POA: Diagnosis not present

## 2023-02-18 DIAGNOSIS — Z7189 Other specified counseling: Secondary | ICD-10-CM

## 2023-02-18 DIAGNOSIS — J90 Pleural effusion, not elsewhere classified: Secondary | ICD-10-CM | POA: Diagnosis not present

## 2023-02-18 DIAGNOSIS — F1120 Opioid dependence, uncomplicated: Secondary | ICD-10-CM

## 2023-02-18 LAB — CBC WITH DIFFERENTIAL/PLATELET
Abs Immature Granulocytes: 0.21 10*3/uL — ABNORMAL HIGH (ref 0.00–0.07)
Basophils Absolute: 0.1 10*3/uL (ref 0.0–0.1)
Basophils Relative: 1 %
Eosinophils Absolute: 0.2 10*3/uL (ref 0.0–0.5)
Eosinophils Relative: 1 %
HCT: 36.6 % (ref 36.0–46.0)
Hemoglobin: 12 g/dL (ref 12.0–15.0)
Immature Granulocytes: 2 %
Lymphocytes Relative: 16 %
Lymphs Abs: 1.8 10*3/uL (ref 0.7–4.0)
MCH: 30.5 pg (ref 26.0–34.0)
MCHC: 32.8 g/dL (ref 30.0–36.0)
MCV: 93.1 fL (ref 80.0–100.0)
Monocytes Absolute: 0.7 10*3/uL (ref 0.1–1.0)
Monocytes Relative: 6 %
Neutro Abs: 8.2 10*3/uL — ABNORMAL HIGH (ref 1.7–7.7)
Neutrophils Relative %: 74 %
Platelets: 63 10*3/uL — ABNORMAL LOW (ref 150–400)
RBC: 3.93 MIL/uL (ref 3.87–5.11)
RDW: 15.9 % — ABNORMAL HIGH (ref 11.5–15.5)
WBC: 11 10*3/uL — ABNORMAL HIGH (ref 4.0–10.5)
nRBC: 0.2 % (ref 0.0–0.2)

## 2023-02-18 LAB — CULTURE, BLOOD (ROUTINE X 2)
Culture: NO GROWTH
Special Requests: ADEQUATE

## 2023-02-18 LAB — BASIC METABOLIC PANEL
Anion gap: 8 (ref 5–15)
BUN: 9 mg/dL (ref 8–23)
CO2: 16 mmol/L — ABNORMAL LOW (ref 22–32)
Calcium: 6.7 mg/dL — ABNORMAL LOW (ref 8.9–10.3)
Chloride: 106 mmol/L (ref 98–111)
Creatinine, Ser: 0.51 mg/dL (ref 0.44–1.00)
GFR, Estimated: >60 mL/min (ref >60)
Glucose, Bld: 98 mg/dL (ref 70–99)
Potassium: 3.4 mmol/L — ABNORMAL LOW (ref 3.5–5.1)
Sodium: 130 mmol/L — ABNORMAL LOW (ref 135–145)

## 2023-02-18 LAB — PHOSPHORUS: Phosphorus: 2 mg/dL — ABNORMAL LOW (ref 2.5–4.6)

## 2023-02-18 LAB — LACTIC ACID, PLASMA: Lactic Acid, Venous: 3.2 mmol/L (ref 0.5–1.9)

## 2023-02-18 MED ORDER — SODIUM CHLORIDE 0.9% FLUSH: 10.0000 mL | Freq: Three times a day (TID) | INTRAVENOUS | Status: DC

## 2023-02-18 MED ORDER — BENZOCAINE 10 % MT GEL
Freq: Four times a day (QID) | OROMUCOSAL | Status: DC | PRN
  Filled 2023-02-18: qty 9.4

## 2023-02-18 MED ORDER — THIAMINE MONONITRATE 100 MG PO TABS
100.0000 mg | ORAL_TABLET | Freq: Every day | ORAL | Status: AC
  Administered 2023-02-19 – 2023-02-23 (×5): 100 mg
  Filled 2023-02-18 (×7): qty 1

## 2023-02-18 MED ORDER — BOOST / RESOURCE BREEZE PO LIQD CUSTOM: 1.0000 | Freq: Three times a day (TID) | ORAL | Status: DC

## 2023-02-18 MED ORDER — OSMOLITE 1.5 CAL PO LIQD
1000.0000 mL | ORAL | Status: DC
  Administered 2023-02-18 – 2023-02-20 (×2): 1000 mL
  Filled 2023-02-18 (×3): qty 1000

## 2023-02-18 MED ORDER — KCL IN DEXTROSE-NACL 20-5-0.9 MEQ/L-%-% IV SOLN
INTRAVENOUS | Status: DC
  Filled 2023-02-18: qty 1000

## 2023-02-18 MED ORDER — OSMOLITE 1.5 CAL PO LIQD: 1000.0000 mL | ORAL | Status: DC

## 2023-02-18 MED ORDER — POTASSIUM CHLORIDE CRYS ER 20 MEQ PO TBCR
40.0000 meq | EXTENDED_RELEASE_TABLET | Freq: Two times a day (BID) | ORAL | Status: AC
  Administered 2023-02-18: 40 meq via ORAL
  Filled 2023-02-18: qty 2

## 2023-02-18 MED ORDER — SODIUM PHOSPHATES 45 MMOLE/15ML IV SOLN
30.0000 mmol | Freq: Once | INTRAVENOUS | Status: AC
  Administered 2023-02-18: 30 mmol via INTRAVENOUS
  Filled 2023-02-18: qty 10

## 2023-02-18 MED ORDER — MORPHINE SULFATE 15 MG PO TABS
7.5000 mg | ORAL_TABLET | ORAL | Status: DC | PRN
  Administered 2023-02-18 – 2023-02-28 (×20): 7.5 mg
  Filled 2023-02-18 (×20): qty 1

## 2023-02-18 NOTE — Progress Notes (Signed)
NAME:  Anita Price, MRN:  [id number], DOB:  11/10/1955, LOS: 5 ADMISSION DATE:  02/13/2023, CONSULTATION DATE:  02/13/23 REFERRING MD:  Robb Matar - TRH, CHIEF COMPLAINT:  weakness6   History of Present Illness:  67 yo F PMH hep C, cirrhosis, HE, chronic pain, prolonged qtc who presented to [facility] ED 11/14 for 3-4d hx weakness + anorexia, n/v. In ED she was noted to be altered, unable to provide much reliable history. Had a CXR which showed L effusion. Underwent CT H c/a/p -- no intracranial abnormality, large L loculated pleural effusion, new thickening of endometrium, liver cirrhosis, splenomegaly, multiple splenic infarcts. Her labs revealed an AKI, meta cidosis, elevated LFTs, electrolyte disturbances, elevated ammonia, lactic acidosis, thrombocytopenia and coagulopathy. Mild leukocytosis. Started on vanc cefepime flagyl.  Underwent thora with IR, 1L milky purulent fluid off. Sent for cx. CXR post procedure with some residual effusion.  PCCM consulted in this setting, for pulm consult to see 11/15 AM.    In d/w daughter 11/15, this came out of nowhere a few days ago. Usually takes care of nephews and is functioning well w her chronic dz processes. Has had encephalopathy in the past r/t infections. Baseline opioid dependence -- follows w pain clinic in [place] & is considering a SCS trial   Pertinent  Medical History  Chronic pain DJD Prolonged qtc Protein calorie malnutrition Cirrhosis Hep C Hepatic encephalopathy   Significant Hospital Events: Including procedures, antibiotic start and stop dates in addition to other pertinent events   11/14 Admit to [facility] IR thora.  11/15 pccm for chest tube, empyema LD> 10K, 71K TNC, 100 neutrophils and was turbid appearing   11/18 775 out from chest tube in the last 24hrs. S/p left apical chest tube placed by IR 11/19 Last 24 hours chest tube out from left lateral 840 cc and left apical 526 cc  Interim History / Subjective:  No complaints. Tender at  new left apical chest tube insertion site. Dark maroon drainage flowing freely - total 526 cc in last 24 hours  Objective   Blood pressure 129/89, pulse 95, temperature (!) 97.1 F (36.2 C), temperature source Axillary, resp. rate (!) 36, height 5\' 5"  (1.651 m), weight 46.9 kg, SpO2 97%.        Intake/Output Summary (Last 24 hours) at 02/18/2023 0821 Last data filed at 02/18/2023 0657 Gross per 24 hour  Intake 3562.94 ml  Output 1591 ml  Net 1971.94 ml   Filed Weights   02/13/23 1700 02/13/23 2030  Weight: 49.9 kg 46.9 kg   Physical Exam: General: Chronically ill and frail-appearing, no acute distress HENT: [place], AT, OP clear, MMM Eyes: EOMI, no scleral icterus Respiratory: Clear to auscultation bilaterally.  No crackles, wheezing or rales. 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 [facility] Pulmonary/Critical Care Medicine 02/18/2023 8:21 AM   See Amion for personal pager For hours between 7 PM to 7 AM, please call Elink for  urgent questions

## 2023-02-18 NOTE — Assessment & Plan Note (Addendum)
Severe malnutrition ruled out - Consult dietitian - Place cortrak - Start tube feeds - High risk for refeeding - Close monitoring mag/phos - Use cautious titration of tube feed rate

## 2023-02-18 NOTE — Progress Notes (Signed)
02/18/23 1100  SLP Visit Information  SLP Received On 02/18/23  General Information  Behavior/Cognition Alert;Cooperative (having pain -)  Patient Positioning Upright in bed  Oral care provided Yes (but caused severe pain - around eroded dentition)  HPI Pt is a 67 yo presenting 11/14 with AMS and several days of N/V. Admitted with sepsis, possible PNA. CT Head negative; CXR on admission showed near complete opacification of the L hemithorax. Previous swallow eval in April 2023 with swallowing impacted by mentation at that time, but she advanced to regular solids and thin liquids quickly. PMH includes: hepatic encephalopathy, metabolic encephalopathy, history open hepatitis C, liver cirrhosis, chronic back pain, DJD, chronic pain syndrome, prolonged QT interval, protein calorie malnutrition, thyroid nodule  Treatment Provided  Treatment provided Dysphagia  Dysphagia Treatment  Temperature Spikes Noted No  Oral Cavity - Dentition Poor condition;Other (Comment) (eroded dentition)  Feeding Needed assist  Respiratory Status Supplemental O2 delivered via (comment) (4 Liters)  Patient observed directly with PO's Yes  Liquids provided via Straw;Cup  Type of cueing Verbal;Tactile;Visual  Amount of cueing Minimal  Other treatment/comments Patient seen to address dysphagia goals.  Intake has been suboptimal at best.  SLP reposition patient to provide oral care.  Brush gently placed on eroded dentition with resultant patient wincing and crying in pain.  Appears with erythema around dentition with gum recession.    Patient with no pain with palpation of her tongue or other areas of gum.    Took pictures and messaged MD regarding concern.   See below -optimal picture compromised due to patient's severe pain with manipulation.    Question if oral issues are contributing to patient's lack of adequate intake.  She does endorse premorbid oral pain and denies worsened currently - however ? accuracy of  this information. Patient reports only reasoning for poor intake is at her lack of appetite.    Hopefully patient is able to improve with treatment of oral pain.   Patient observed consuming tiny boluses of graham cracker, water and [employer].  Adequate mastication with full oral clearance and patient denying significant pain.  Delayed cough noted after liquid intake with production to minimal thin and clear secretions.    She denies issues with reflux, cough did NOT appear correlated to oral pharyngeal swallow in this SLPs opinion but will follow closely.  Patient denies dysphagia prior to admission.  She does need encouragement/moderate cues to accept p.o. intake and be positioned upright.    Encouraged patient to continue p.o. intake with max cues and offered patient an Ensure mixed with ice cream, to which she declined.     Treatment Methods Skilled observation;Differential diagnosis;Compensation strategy training;Patient/caregiver education  Oral Phase Signs & Symptoms Prolonged bolus formation  Pharyngeal Phase Signs & Symptoms Delayed cough  Type of PO's observed Dysphagia 3 (soft);Thin liquids  SLP - End of Session  Patient left in bed;with call bell/phone within reach;with bed alarm set  Nurse Communication Aspiration precautions reviewed;Diet recommendation  Assessment / Recommendations / Plan  Plan Continue with current plan of care  Dysphagia Recommendations  Diet recommendations Dysphagia 3 (mechanical soft);Thin liquid  Liquids provided via Straw;Cup  Medication Administration Whole meds with liquid (or with applesauce)  Supervision Staff to assist with self feeding  Compensations Minimize environmental distractions;Slow rate;Small sips/bites  Postural Changes and/or Swallow Maneuvers Seated upright 90 degrees;Upright 30-60 min after meal  General Recommendations  Oral Care Recommendations Oral care BID (Have patient swish and expectorate)  Follow Up Recommendations  No  SLP follow up  SLP Visit Diagnosis Dysphagia, unspecified (R13.10)  Progression Toward Goals  Progression toward goals Progressing toward goals  Patient/Family Stated Goal none stated  SLP Time Calculation  SLP Start Time (ACUTE ONLY) 0925  SLP Stop Time (ACUTE ONLY) 1006  SLP Time Calculation (min) (ACUTE ONLY) 41 min  SLP Evaluations  $ SLP Speech Visit 1 Visit  SLP Evaluations  $Swallowing Treatment 1 Procedure  $Self Care/Home Management 8-22   [name], MS [facility] SLP Acute Rehab Services Office [phone]

## 2023-02-18 NOTE — Consult Note (Signed)
Consultation Note Date: 02/18/2023   Patient Name: Anita Price  DOB: 1955/12/06  MRN: [id number]  Age / Sex: 67 y.o., female   PCP: Jackie Plum, MD Referring Physician: Alberteen Sam, *  Reason for Consultation: Establishing goals of care     Chief Complaint/History of Present Illness:   Patient is a 67 year old female with a past medical history of cirrhosis in the setting of untreated hepatitis C, uncomplicated opioid dependence, protein calorie malnutrition, and hypertension who was admitted on 02/13/2023 for management of vomiting and decreased mentation.  During admission patient was found to have empyema and sepsis secondary to Streptococcus.  Patient has received management for empyema including antibiotics and chest tube placement.  Patient not a candidate for VATS.  Pulmonology following recommendations.  Patient also receiving management for AKI and encephalopathy.  Palliative medicine team consulted to assist with complex medical decision making.  Extensive review of EMR prior to presenting to bedside.  When presenting to bedside, patient laying in bed awake.  Patient will acknowledge providers presents though was not able to easily engage in conversation.  Patient mumbling and confused at times.  Patient can answer that she has 2 children though cannot tell me their names.  Noted would reach out to daughter listed in chart to which patient agreed.  Unable to engage in complex medical decision-making due to patient's medical status.  Able to call patient's daughter, [name], later in afternoon.  Introduced myself and the role of the palliative medicine team and patient's medical care.  Melissa noted appreciation for continued care her mother has received here.  Melissa noted she has heard updates every day she has been by which she is very thankful for.  Melissa notes her mother has a lung infection and sepsis secondary to this.  Melissa noted that patient has  been septic before and it took approximately 4 days for patient to "snap out of it" and return to her baseline mentation.  Efraim Kaufmann is concerned since it is taking longer for the patient to have return of her mentation this admission.   Noted that SLP and nutrition had been consulted today due to patient's malnutrition.  Daughter voiced appreciation for this.  Daughter noted that patient has always had poor dentition and has not been willing to follow-up with dentist regarding this.  Because of patient's poor dentition, she eats softer foods at home like yogurt as able. Inquired about what patient's life was like at home prior to admission.  Daughter noted that patient was able to take care of her 2 grandchildren fairly easily.  Patient was able to engage in conversation.  Inquired about Melissa having a sibling and she noted that her brother has passed away.  Efraim Kaufmann is currently watching her brother's children since the patient is unable to at this time.  Melissa noted that she "hopes her mother gets better as the kids cannot take another heartbreak" after losing her father.  Spent time providing emotional support via active listening. Melissa noted that she usually comes to visit every day around 4-5 PM.  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  Acknowledged this and need for her to take care of herself first.  Discussed that should there be any changes in patient's medical status, she is technically next of kin, which she confirmed, and will be appropriately updated.  Noted taking things a day at a time with patient's medical  illness.  All questions answered at that time.  Noted palliative medicine team would continue to follow along with patient's medical journey.  Primary Diagnoses  Present on Admission:  Essential hypertension  Hepatic cirrhosis in setting of untreated hepatitis C   Sepsis due to Streptococcus intermedius empyema  (HCC)  Empyema (HCC)  Hyponatremia  AKI (acute kidney injury) (HCC)  Acute metabolic encephalopathy  Protein-calorie malnutrition, severe  Prolonged QT interval  Tobacco use   Past Medical History:  Diagnosis Date   Acute metabolic encephalopathy    Chronic back pain    DJD (degenerative joint disease)    Essential hypertension 07/10/2020   Gallbladder sludge    Hepatitis C 07/10/2020   Liver cirrhosis (HCC)    Pain management    Prolonged QT interval    Protein calorie malnutrition (HCC)    Thyroid nodule    Tobacco use 07/10/2020   Social History   Socioeconomic History   Marital status: Single    Spouse name: Not on file   Number of children: Not on file   Years of education: Not on file   Highest education level: Not on file  Occupational History   Not on file  Tobacco Use   Smoking status: Every Day    Current packs/day: 0.50    Types: Cigarettes   Smokeless tobacco: Never  Vaping Use   Vaping status: Never Used  Substance and Sexual Activity   Alcohol use: No    Comment: used to drink wine on regular basis but not in the past 20 years-as per chart notes 06/2020   Drug use: Not Currently    Comment: remote hx IV drug use in her early 70s   Sexual activity: Not on file  Other Topics Concern   Not on file  Social History Narrative   Not on file   Social Determinants of Health   Financial Resource Strain: Not on file  Food Insecurity: No Food Insecurity (02/14/2023)   Hunger Vital Sign    Worried About Running Out of Food in the Last Year: Never true    Ran Out of Food in the Last Year: Never true  Transportation Needs: No Transportation Needs (02/14/2023)   PRAPARE - Administrator, Civil Service (Medical): No    Lack of Transportation (Non-Medical): No  Physical Activity: Not on file  Stress: Not on file  Social Connections: Unknown (08/01/2021)   [facility]   Social Network    Social Network: Not on file   Family  History  Problem Relation Age of Onset   Scoliosis Mother    Scheduled Meds:  carvedilol  3.125 mg Oral BID WC   Chlorhexidine Gluconate Cloth  6 each Topical Daily   feeding supplement  237 mL Oral BID BM   lactulose  10 g Oral TID   pantoprazole  40 mg Oral Daily   potassium chloride  40 mEq Oral BID   rifaximin  550 mg Oral BID   sodium chloride flush  10 mL Intravenous Q12H   sodium chloride flush  10 mL Intrapleural Q8H   Continuous Infusions:   ceFAZolin (ANCEF) IV Stopped (02/18/23 0626)   sodium phosphate 30 mmol in dextrose 5 % 250 mL infusion     PRN Meds:.acetaminophen **OR** acetaminophen, lip balm, morphine injection, mouth rinse, prochlorperazine Allergies  Allergen Reactions   Celecoxib Other (See Comments)    Insomnia   Duloxetine Hcl Other (See Comments)    Insomnia  Tizanidine Nausea And Vomiting   Venlafaxine Other (See Comments)    Hallucinations   Effersyllium [Psyllium] Other (See Comments)    Made the patient "feel badly"   Escitalopram Other (See Comments)    insomnia   Neurontin [Gabapentin] Other (See Comments)    Made the patient "feel badly"   Tramadol Other (See Comments)    Made the patient "feel badly"   CBC:    Component Value Date/Time   WBC 11.0 (H) 02/18/2023 0307   HGB 12.0 02/18/2023 0307   HCT 36.6 02/18/2023 0307   PLT 63 (L) 02/18/2023 0307   MCV 93.1 02/18/2023 0307   NEUTROABS 8.2 (H) 02/18/2023 0307   LYMPHSABS 1.8 02/18/2023 0307   MONOABS 0.7 02/18/2023 0307   EOSABS 0.2 02/18/2023 0307   BASOSABS 0.1 02/18/2023 0307   Comprehensive Metabolic Panel:    Component Value Date/Time   NA 130 (L) 02/18/2023 0307   K 3.4 (L) 02/18/2023 0307   CL 106 02/18/2023 0307   CO2 16 (L) 02/18/2023 0307   BUN 9 02/18/2023 0307   CREATININE 0.51 02/18/2023 0307   GLUCOSE 98 02/18/2023 0307   CALCIUM 6.7 (L) 02/18/2023 0307   AST 100 (H) 02/14/2023 0309   ALT 48 (H) 02/14/2023 0309   ALKPHOS 106 02/14/2023 0309   BILITOT 1.7  (H) 02/14/2023 0309   PROT 5.2 (L) 02/14/2023 0309   ALBUMIN 2.0 (L) 02/17/2023 0255    Physical Exam: Vital Signs: BP 129/89   Pulse 95   Temp (!) 97.1 F (36.2 C) (Axillary)   Resp (!) 36   Ht 5\' 5"  (1.651 m)   Wt 46.9 kg   SpO2 97%   BMI 17.21 kg/m  SpO2: SpO2: 97 % O2 Device: O2 Device: Nasal Cannula O2 Flow Rate: O2 Flow Rate (L/min): 4 L/min Intake/output summary:  Intake/Output Summary (Last 24 hours) at 02/18/2023 0842 Last data filed at 02/18/2023 0657 Gross per 24 hour  Intake 3562.94 ml  Output 1591 ml  Net 1971.94 ml   LBM: Last BM Date : 02/17/23 Baseline Weight: Weight: 49.9 kg Most recent weight: Weight: 46.9 kg  General: NAD, lethargic, confused HENT: poor dentition Cardiovascular: RRR Respiratory: not in respiratory distress Neuro: confused          Palliative Performance Scale: 30%              Additional Data Reviewed: Recent Labs    02/17/23 0255 02/18/23 0307  WBC 5.3 11.0*  HGB 11.9* 12.0  PLT 44* 63*  NA 134* 130*  BUN 11 9  CREATININE 0.55 0.51    Imaging: DG Chest Port 1 View CLINICAL DATA:  Chest tube.  EXAM: PORTABLE CHEST 1 VIEW  COMPARISON:  February 17, 2023.  FINDINGS: There is been interval placement of another left-sided chest tube more superiorly. Pleural effusion is significantly smaller, although some degree of pneumothorax is now seen.  IMPRESSION: Interval placement of another left-sided chest tube with decreased left pleural effusion, but small pneumothorax now seen.  Electronically Signed   By: Lupita Raider M.D.   On: 02/18/2023 07:39    I personally reviewed recent imaging.   Palliative Care Assessment and Plan Summary of Established Goals of Care and Medical Treatment Preferences   Patient is a 67 year old female with a past medical history of cirrhosis in the setting of untreated hepatitis C, uncomplicated opioid dependence, protein calorie malnutrition, and hypertension who was admitted on  02/13/2023 for management of vomiting and decreased mentation.  During admission patient was found to have empyema and sepsis secondary to Streptococcus.  Patient has received management for empyema including antibiotics and chest tube placement.  Patient not a candidate for VATS.  Pulmonology following recommendations.  Patient also receiving management for AKI and encephalopathy.  Palliative medicine team consulted to assist with complex medical decision making.  # Complex medical decision making/goals of care  -Patient unable to participate in complex medical decision making due to medical status.  -Discussed care with patient's NOK/daughter, [facility], as detailed above in HPI.  Melissa noting difficulty processing patient's current medical illness as patient was "functional" prior to this hospitalization including looking after her grandchildren.  Melissa acknowledges that patient has had poor oral intake due to poor digitation at home; unable to follow-up with dentist regarding this so patient has been eating softer foods like yogurt. Melissa acknowledged hope that patient will get better as she is afraid that patient's grandchildren have already gone through enough "heartbreak" with their father (patient's son) passing away.  Introduced palliative medicine team and our role in patient's care.  As providing introductions and emotional support, did not push difficult conversations at this time though how to engage in medical planning moving forward (hopeful patient's mentation may improve enough to participate herself).   -  Code Status: Full Code   # Psycho-social/Spiritual Support:  - Support System: daughter; patient's son is deceased   # Discharge Planning:  To Be Determined  Thank you for allowing the palliative care team to participate in the care [name].  Alvester Morin, DO Palliative Care Provider PMT # [phone]  If patient remains symptomatic despite maximum doses,  please call PMT at [phone] between 0700 and 1900. Outside of these hours, please call attending, as PMT does not have night coverage.  Personally spent 82 minutes in patient care including extensive chart review (labs, imaging, progress/consult notes, vital signs), medically appropraite exam, discussed with treatment team, education to patient, family, and staff, documenting clinical information, medication review and management, coordination of care, and available advanced directive documents.    *Please note that this is a verbal dictation therefore any spelling or grammatical errors are due to the "[employer]" system interpretation.

## 2023-02-18 NOTE — Progress Notes (Signed)
Initial Nutrition Assessment  DOCUMENTATION CODES:   Non-severe (moderate) malnutrition in context of chronic illness  INTERVENTION:  - Once SBFT placed and xray verified in the stomach, recommend below TF regimen: Osmolite 1.5 at 45 ml/h (1080 ml per day) *Recommend starting at 14mL/hr and advancing by 10mL Q12H Prosource TF20 60 ml daily Provides 1700 kcal, 88 gm protein, 823 ml free water daily *Consider Q4H FWF to provide a total of 1752mL/day  - Monitor magnesium, potassium, and phosphorus BID for at least 3 days, MD to replete as needed, as pt is at risk for refeeding syndrome given malnutrition. - Recommend 100mg  thiamine x5 days due to risk of refeeding.   - Monitor weight trends.    NUTRITION DIAGNOSIS:   Moderate Malnutrition related to chronic illness as evidenced by moderate fat depletion, severe muscle depletion.  GOAL:   Patient will meet greater than or equal to 90% of their needs  MONITOR:   PO intake, Supplement acceptance, Labs, Weight trends, TF tolerance  REASON FOR ASSESSMENT:   Consult Enteral/tube feeding initiation and management  ASSESSMENT:   67 y.o. F with cirrhosis, HTH, hx HE who presented with vomiting and decreased mentation, found to have empyema and sepsis.  11/14 Admit  Patient in bed at time of visit. Experiencing AMS and unable to answer most questions appropriately. Did state she was not hungry and grimaced when Ensure was mentioned. No family or visitors at bedside.  Per chart review, no weight history since April 2023 to assess weight changes over the past year.  Patient being seen by SLP and recommended a DYS 3 diet. However, has been refusing all meals and all Ensure.  RN reports patient just doesn't seem to want to eat and this AM not wanting to take medications.   Having some secretions and gagging on them. Suspect she may do better with thin Boost Breeze.   After visit with patient, received RD consult for enteral  nutrition management. SBFT ordered. No tube yet placed. Suspect patient is at high risk for refeeding syndrome. Recommend very slow advancement, thiamine, and close monitoring of electrolytes.    Medications reviewed and include: Protonix  Labs reviewed:  Na 130 K+ 3.4 Phosphorus 2.0   NUTRITION - FOCUSED PHYSICAL EXAM:  Flowsheet Row Most Recent Value  Orbital Region Moderate depletion  Upper Arm Region Mild depletion  Thoracic and Lumbar Region Moderate depletion  Buccal Region Moderate depletion  Temple Region Severe depletion  Clavicle Bone Region Severe depletion  Clavicle and Acromion Bone Region Severe depletion  Scapular Bone Region Unable to assess  Dorsal Hand Mild depletion  Patellar Region Moderate depletion  Anterior Thigh Region Moderate depletion  Posterior Calf Region Mild depletion  Edema (RD Assessment) Moderate  Hair Reviewed  [thin, dry]  Eyes Reviewed  Mouth Reviewed  [edentulous]  Skin Reviewed  Nails Reviewed    Diet Order:   Diet Order             DIET DYS 3 Room service appropriate? Yes; Fluid consistency: Thin  Diet effective now                   EDUCATION NEEDS:  Not appropriate for education at this time  Skin:  Skin Assessment: Reviewed RN Assessment  Last BM:  11/18 - type 7  Height:  Ht Readings from Last 1 Encounters:  02/13/23 5\' 5"  (1.651 m)   Weight:  Wt Readings from Last 1 Encounters:  02/13/23 46.9 kg  BMI:  Body mass index is 17.21 kg/m.  Estimated Nutritional Needs:  Kcal:  1650-1800 kcals Protein:  80-100 grams Fluid:  >/= 1.7L    Shelle Iron RD, LDN For contact information, refer to [facility].

## 2023-02-18 NOTE — Plan of Care (Signed)
  Problem: Respiratory: Goal: Ability to maintain adequate ventilation will improve Outcome: Progressing   Problem: Clinical Measurements: Goal: Diagnostic test results will improve Outcome: Not Progressing Goal: Signs and symptoms of infection will decrease Outcome: Not Progressing   Problem: Clinical Measurements: Goal: Will remain free from infection Outcome: Not Progressing   Problem: Nutrition: Goal: Adequate nutrition will be maintained Outcome: Not Progressing

## 2023-02-18 NOTE — Progress Notes (Signed)
Progress Note   Patient: Anita Price UEA:[id number] DOB: Jan 24, 1956 DOA: 02/13/2023     5 DOS: the patient was seen and examined on 02/18/2023 at 9:20 AM      Brief hospital course: 67 y.o. F with cirrhosis, HTH, hx HE who presented with vomiting and decreased mentation, found to have empyema and sepsis.     Significant events: 11/14: Admitted on antibiotics, thoracentesis with purulent material 11/15: Pulmonology consulted, chest tube inserted 11/16: Breathing worse; Up to 35L HFNC; Lytics given 11/17: IR consulted for second chest tube 11/18: Second chest tube placed 11/19: Cortrak placed    Significant studies: 11/14 CT head: NAD 11/14 CT chest: large left pleural effusion 11/15 Echo: Normal EF, normal valves 11/18 CT chest repeat:    Significant microbiology data: 11/14 Bcx x1: NGTD 11/14 pleural fluid: Strep intermedius    Procedures: 11/14: Thoracentesis 11/15: Chest tube insertion 11/16: Pleural lytics    Consults: Pulmonology IR      Assessment and Plan: * Sepsis due to Streptococcus intermedius empyema (HCC) Empyema P/w tachycardia, tachypnea, SOFA 4 (mental status, pao2/fio2, creatinine, bilirubin) and empyema.  Thoracentesis on 11/14 showed thick milky fluid, >70K nucleated cells, strep intermedius, pan sensitive Chest tube placed 11/15, lytics instilled but still loculated collections Second chest tube placed 11/18 Still high output from tubes Strep intermedius growing from thoracentesis culture  Not a VATS candidate due to platelets, malnutrition, poor oral intake. - Continue cefazolin 4 weeks - Chest tube care/flushes/timing of removal per Pulmonology - Consult Pulmonology - Second chest tube today     Acute metabolic encephalopathy Due to ongoing sepsis.  Waxes and wanes.  No improvement with lactulose.  Low suspicion for significant HE.   - Stop lactulose, Xifaxan (not on these at baseline) - Treat underlying  empyema  Hepatic cirrhosis in setting of untreated hepatitis C  Tbili 1.7, INR 2.9, Na 128, Cr 1.6 on admission, MELD 27 on admission  Here, decompensated due to sepsis INR and TBili stable today - Stop Lactulose and Xifaxan given diarrhea - Hold diuretics - Outpatient GI follow-up - Trend INR  Malnutrition of moderate degree Severe malnutrition ruled out - Consult dietitian - Place cortrak - Start tube feeds - High risk for refeeding - Close monitoring mag/phos - Use cautious titration of tube feed rate  Hypophosphatemia - Supplement Phos   Thrombocytopenia (HCC) Plts up to 60K, no clinical bleeding  Poor dentition Appears to have exposed nerve root lower jaw #25 - Orajel - Outpatient dentist  Uncomplicated opioid dependence (HCC) - Resume home MS contin  Hypokalemia Resolved  Abnormal ultrasound of uterus Incidental new endometrial thickening measuring up to 10-11 mm abnormal for this age group.  - Nonemergent pelvic ultrasound is recommended in outpatient setting  AKI (acute kidney injury) (HCC) Baseline Cr 0.8, up to 1.6 on admission, improved with fluids.  Prolonged QT interval Resolved  Hyponatremia Slightly low, asymptomatic   Essential hypertension BP elevated - Continue Coreg         Subjective: Second chest tube went in yesterday, core track placed today.  Her overall mentation seems gradually better over the last 3 days, certainly better than when she was admitted, but still persistently very bad.  She reports oral pain due to an exposed nerve root.     Physical Exam: BP 129/89   Pulse 95   Temp 98 F (36.7 C) (Oral)   Resp (!) 36   Ht 5\' 5"  (1.651 m)   Wt 46.9 kg  SpO2 97%   BMI 17.21 kg/m   Frail elderly female, lying in bed, weak and listless Heart rate tachycardic, respiratory rate increased, no murmurs, no pitting edema in the extremities Lung sounds diminished, worse on the left Abdomen soft without tenderness  palpation or guarding Attention poor, oriented to self, [place] [facility], but not the month, and she has very little stamina for answering questions, mostly just closes her eyes and goes back to sleep, can barely lift her arms, strength symmetric, very weak    Data Reviewed: Discussed with critical care Chest x-ray shows decreased left effusion Lactic acid 5.5 White blood cell count up to 11 Platelets up to 63,000 Hemoglobin 12, stable Potassium down to 3.4, phosphorus also low, creatinine stable    Family Communication: None present    Disposition: Status is: Inpatient Patient was admitted for sepsis and a large left empyema due to strep intermedius  She has now had 2 chest tubes placed, but still has persistent encephalopathy, severe weakness, limiting her recovery  Cortrak in today, will have a long recovery ahead       Author: Alberteen Sam, MD 02/18/2023 3:06 PM  For on call review [email or web address].

## 2023-02-19 ENCOUNTER — Inpatient Hospital Stay (HOSPITAL_COMMUNITY): Payer: Medicare Other

## 2023-02-19 DIAGNOSIS — J90 Pleural effusion, not elsewhere classified: Secondary | ICD-10-CM | POA: Diagnosis not present

## 2023-02-19 DIAGNOSIS — Z7189 Other specified counseling: Secondary | ICD-10-CM | POA: Diagnosis not present

## 2023-02-19 DIAGNOSIS — Z515 Encounter for palliative care: Secondary | ICD-10-CM | POA: Diagnosis not present

## 2023-02-19 DIAGNOSIS — R4589 Other symptoms and signs involving emotional state: Secondary | ICD-10-CM | POA: Diagnosis not present

## 2023-02-19 DIAGNOSIS — G9341 Metabolic encephalopathy: Secondary | ICD-10-CM | POA: Diagnosis not present

## 2023-02-19 DIAGNOSIS — A419 Sepsis, unspecified organism: Secondary | ICD-10-CM | POA: Diagnosis not present

## 2023-02-19 LAB — BASIC METABOLIC PANEL
Anion gap: 8 (ref 5–15)
BUN: 14 mg/dL (ref 8–23)
CO2: 19 mmol/L — ABNORMAL LOW (ref 22–32)
Calcium: 7 mg/dL — ABNORMAL LOW (ref 8.9–10.3)
Chloride: 107 mmol/L (ref 98–111)
Creatinine, Ser: 0.58 mg/dL (ref 0.44–1.00)
GFR, Estimated: >60 mL/min (ref >60)
Glucose, Bld: 118 mg/dL — ABNORMAL HIGH (ref 70–99)
Potassium: 4 mmol/L (ref 3.5–5.1)
Sodium: 134 mmol/L — ABNORMAL LOW (ref 135–145)

## 2023-02-19 LAB — MAGNESIUM: Magnesium: 1.3 mg/dL — ABNORMAL LOW (ref 1.7–2.4)

## 2023-02-19 LAB — CULTURE, BLOOD (ROUTINE X 2)
Culture: NO GROWTH
Special Requests: ADEQUATE

## 2023-02-19 LAB — GLUCOSE, CAPILLARY
Glucose-Capillary: 146 mg/dL — ABNORMAL HIGH (ref 70–99)
Glucose-Capillary: 114 mg/dL — ABNORMAL HIGH (ref 70–99)

## 2023-02-19 LAB — PHOSPHORUS: Phosphorus: 2.1 mg/dL — ABNORMAL LOW (ref 2.5–4.6)

## 2023-02-19 MED ORDER — ORAL CARE MOUTH RINSE
15.0000 mL | OROMUCOSAL | Status: DC
  Administered 2023-02-19 – 2023-03-03 (×32): 15 mL via OROMUCOSAL

## 2023-02-19 MED ORDER — ZINC OXIDE 40 % EX OINT
TOPICAL_OINTMENT | CUTANEOUS | Status: DC | PRN
  Filled 2023-02-19 (×2): qty 57

## 2023-02-19 MED ORDER — SODIUM CHLORIDE 0.9 % IV SOLN: INTRAVENOUS | Status: AC

## 2023-02-19 MED ORDER — SODIUM PHOSPHATES 45 MMOLE/15ML IV SOLN
30.0000 mmol | Freq: Once | INTRAVENOUS | Status: AC
  Administered 2023-02-19: 30 mmol via INTRAVENOUS
  Filled 2023-02-19: qty 10

## 2023-02-19 MED ORDER — MAGNESIUM SULFATE 2 GM/50ML IV SOLN
2.0000 g | Freq: Once | INTRAVENOUS | Status: AC
  Administered 2023-02-19: 2 g via INTRAVENOUS
  Filled 2023-02-19: qty 50

## 2023-02-19 MED ORDER — PANTOPRAZOLE SODIUM 40 MG IV SOLR
40.0000 mg | INTRAVENOUS | Status: DC
  Administered 2023-02-20 – 2023-03-01 (×10): 40 mg via INTRAVENOUS
  Filled 2023-02-19 (×10): qty 10

## 2023-02-19 MED ORDER — ACETAMINOPHEN 160 MG/5ML PO SOLN
650.0000 mg | Freq: Four times a day (QID) | ORAL | Status: DC | PRN
  Administered 2023-02-22: 650 mg
  Filled 2023-02-19: qty 20.3

## 2023-02-19 MED ORDER — PROSOURCE TF20 ENFIT COMPATIBL EN LIQD
60.0000 mL | Freq: Every day | ENTERAL | Status: DC
  Administered 2023-02-19 – 2023-02-23 (×5): 60 mL
  Filled 2023-02-19 (×6): qty 60

## 2023-02-19 MED ORDER — CARVEDILOL 3.125 MG PO TABS
3.1250 mg | ORAL_TABLET | Freq: Two times a day (BID) | ORAL | Status: DC
  Administered 2023-02-19 – 2023-02-28 (×18): 3.125 mg via NASOGASTRIC
  Filled 2023-02-19 (×18): qty 1

## 2023-02-19 MED ORDER — LOPERAMIDE HCL 1 MG/7.5ML PO SUSP
4.0000 mg | Freq: Once | ORAL | Status: AC
  Administered 2023-02-19: 4 mg
  Filled 2023-02-19: qty 30

## 2023-02-19 MED ORDER — ORAL CARE MOUTH RINSE
15.0000 mL | OROMUCOSAL | Status: DC | PRN
Start: 2023-02-19 — End: 2023-03-03

## 2023-02-19 MED ORDER — ACETAMINOPHEN 650 MG RE SUPP: 650.0000 mg | Freq: Four times a day (QID) | RECTAL | Status: DC | PRN

## 2023-02-19 MED ORDER — LOPERAMIDE HCL 2 MG PO CAPS: 4.0000 mg | ORAL_CAPSULE | Freq: Once | ORAL | Status: DC

## 2023-02-19 NOTE — Plan of Care (Signed)
  Problem: Clinical Measurements: Goal: Signs and symptoms of infection will decrease Outcome: Progressing   Problem: Respiratory: Goal: Ability to maintain adequate ventilation will improve Outcome: Progressing   Problem: Clinical Measurements: Goal: Ability to maintain clinical measurements within normal limits will improve Outcome: Progressing   Problem: Nutrition: Goal: Adequate nutrition will be maintained Outcome: Progressing   Problem: Elimination: Goal: Will not experience complications related to urinary retention Outcome: Progressing   Problem: Pain Management: Goal: General experience of comfort will improve Outcome: Not Progressing

## 2023-02-19 NOTE — Progress Notes (Signed)
NAME:  Anita Price, MRN:  [id number], DOB:  1955/07/23, LOS: 6 ADMISSION DATE:  02/13/2023, CONSULTATION DATE:  02/13/23 REFERRING MD:  Robb Matar - TRH, CHIEF COMPLAINT:  weakness6   History of Present Illness:  67 yo F PMH hep C, cirrhosis, HE, chronic pain, prolonged qtc who presented to [facility] ED 11/14 for 3-4d hx weakness + anorexia, n/v. In ED she was noted to be altered, unable to provide much reliable history. Had a CXR which showed L effusion. Underwent CT H c/a/p -- no intracranial abnormality, large L loculated pleural effusion, new thickening of endometrium, liver cirrhosis, splenomegaly, multiple splenic infarcts. Her labs revealed an AKI, meta cidosis, elevated LFTs, electrolyte disturbances, elevated ammonia, lactic acidosis, thrombocytopenia and coagulopathy. Mild leukocytosis. Started on vanc cefepime flagyl.  Underwent thora with IR, 1L milky purulent fluid off. Sent for cx. CXR post procedure with some residual effusion.  PCCM consulted in this setting, for pulm consult to see 11/15 AM.    In d/w daughter 11/15, this came out of nowhere a few days ago. Usually takes care of nephews and is functioning well w her chronic dz processes. Has had encephalopathy in the past r/t infections. Baseline opioid dependence -- follows w pain clinic in [place] & is considering a SCS trial   Pertinent  Medical History  Chronic pain DJD Prolonged qtc Protein calorie malnutrition Cirrhosis Hep C Hepatic encephalopathy   Significant Hospital Events: Including procedures, antibiotic start and stop dates in addition to other pertinent events   11/14 Admit to [facility]. IR thora.  11/15 pccm for chest tube, empyema LD> 10K, 71K TNC, 100 neutrophils and was turbid appearing   11/18 775 out from chest tube in the last 24hrs. S/p left apical chest tube placed by IR 11/19 Last 24 hours chest tube out from left lateral 840 cc and left apical 526 cc 11/20 24hr output 160 from lateral tube, 350 from anterior  tube.   Interim History / Subjective:  Not complaining of anything Comfortable Still significant, but less, output from both chest tubes.   Objective   Blood pressure 135/69, pulse (!) 102, temperature 97.6 F (36.4 C), temperature source Axillary, resp. rate (!) 27, height 5\' 5"  (1.651 m), weight 50.4 kg, SpO2 99%.        Intake/Output Summary (Last 24 hours) at 02/19/2023 0743 Last data filed at 02/19/2023 4098 Gross per 24 hour  Intake 1151.67 ml  Output 1060 ml  Net 91.67 ml   Filed Weights   02/13/23 1700 02/13/23 2030 02/19/23 0413  Weight: 49.9 kg 46.9 kg 50.4 kg   Physical Exam: General: Frail adult female in NAD HEENT: Brogan/AT, PERRL, poor dentition Respiratory: Clear bilateral breath sounds Cardiovascular: RRR, no MRG GI: BS+, soft, nontender, non-distended Extremities:-no acute deformity or ROM limitation Neuro: Alert, oriented, non-focal  WBC 11 Plt 63 LA improved to 3.2  Resolved Hospital Problem list     Assessment & Plan:   Strep intermedius empyema on left Acute encephalopathy 2/2 sepsis Chronic opiate dependent pain Dysphagia Frail Severe protein calorie malnutrition Hep C cirrhosis w/ splenic sequestration of platelets Thora 11/14: 1 liter milky white output Chest tube 11/15 > lytics 11/15 and 11/16 without much response.  Additional apical chest tube placed 11/18  11/19  - 24 hour output 160 from lateral tube, 350 from anterior tube P: Continue Cefazolin, x4 weeks  Chest tube care/flushes per protocol Monitor output, when falls below 150 mL per day can consider removing tubes.    Renae Fickle  Mikey Bussing, AGACNP-BC [facility]  See Amion for personal pager PCCM on call pager [phone] until 7pm. Please call Elink 7p-7a. [phone]  02/19/2023 9:53 AM

## 2023-02-19 NOTE — Progress Notes (Signed)
Daily Progress Note   Patient Name: Anita Price       Date: 02/19/2023 DOB: 10/24/1955  Age: 67 y.o. MRN#: [id number] Attending Physician: Rhetta Mura, MD Primary Care Physician: Jackie Plum, MD Admit Date: 02/13/2023 Length of Stay: 6 days  Reason for Consultation/Follow-up: Establishing goals of care  Subjective:   CC: Patient denies Cortrack causing her pain. Following up regarding complex medical decision making.   Subjective:  Reviewed EMR prior to presenting to bedside. Patient had Cortrack placed to assist with nutrition management.   Presented to bedside to see patient. Patient seen sleeping in bed though easily awakened. Though patient cannot engage in extensive conversation, she is able to provide some short answers today. She denies being in pain from the [facility]. She did not have any symptoms of concern at this time. She acknowledged that I spoke with her daughter yesterday for updates. She admits that she still feels confused and tired at this time and so cannot engage in extensive conversation.  Acknowledged this and noted would continue conversations with her and daughter as able. Thanked her for allowing me to visit with her today.   Later in the afternoon able to call patient's daughter, Anita Price.  Melissa welcoming the call.  Noted she is currently with her nephew and will be coming to the hospital in a little while to visit the patient.  Provided updates today regarding core track placement and trying to maintain nutrition with hopes patient's mentation will improved so that she can have oral intake.  Daughter again reaffirmed that patient had difficulty eating at home with her poor dentition and mainly eat soft foods like yogurt. With permission, we will to discuss importance of planning moving forward.  Daughter acknowledges this.  Daughter expresses great hope in patient's "improvement" because she has got her 2 grandchildren to live for.   Daughter acknowledges that patient "was not taking care of herself like she should have" and so she wants to make sure patient has follow-up after hospitalization.  Agreed with appropriate planning should patient be able to improve to the point of discharge.  Also with permission able to discuss CODE STATUS.  Daughter states patient would "want everything done" regarding life support.  Melissa states that patient would "want a chance" to live for her grandchildren since her brother is already deceased.  Did express concern that if patient is sick enough her heart stops or she stops breathing, interventions such as cardiac resuscitation and intubation would likely not lead to quality of life.  Melissa acknowledged this and noted that she is had to make the difficult decision before to remove life support for her own brother.  Empathized with difficult situation. Melissa continues to express hope for patient's improvement.  She is visiting today and wants to be able to interact with patient to provide her encouragement and help.  Acknowledged this.  Melissa voiced appreciation for continued calls and updates.   Noted palliative medicine team and continue following with patient's medical journey.  Review of Systems  Objective:   Vital Signs:  BP 134/69 (BP Location: Left Arm)   Pulse (!) 106   Temp 97.9 F (36.6 C) (Axillary)   Resp (!) 28   Ht 5\' 5"  (1.651 m)   Wt 50.4 kg   SpO2 98%   BMI 18.49 kg/m   Physical Exam: General: NAD, chronically ill appearing, cachectic, frail  HENT: poor dentition Cardiovascular: RRR Respiratory: not in respiratory distress, increased work of breathing noted  Neuro: minimally interactive (though slightly improved from 11/19 examination for this provider)  Imaging: I personally reviewed recent imaging.   Assessment & Plan:   Assessment: Patient is a 67 year old female with a past medical history of cirrhosis in the setting of untreated hepatitis C,  uncomplicated opioid dependence, protein calorie malnutrition, and hypertension who was admitted on 02/13/2023 for management of vomiting and decreased mentation.  During admission patient was found to have empyema and sepsis secondary to Streptococcus.  Patient has received management for empyema including antibiotics and chest tube placement.  Patient not a candidate for VATS.  Pulmonology following recommendations.  Patient also receiving management for AKI and encephalopathy.  Palliative medicine team consulted to assist with complex medical decision making.    Recommendations/Plan: # Complex medical decision making/goals of care:  -Patient unable to participate in complex medical decision making due to medical status.                -Discussed care with patient's NOK/daughter, Anita Price, as detailed above in HPI.  Melissa already noted this is difficult to process because patient was "functional" prior to this hospitalization including looking after her grandchildren.  Efraim Kaufmann acknowledged family difficulty of brother passing away.                 -Melissa is hopeful patient will be able to improve for her grandchildren sake since she does not want them to lose their grandmother as they lost their father.  At this time Efraim Kaufmann expresses desire for all medical interventions including full CODE STATUS.   -Melissa stated that she had to make the difficult decision to "remove" her brother from life support when he died. - Code Status: Full Code    # Psycho-social/Spiritual Support:  - Support System: daughter; patient's son is deceased    # Discharge Planning:  To Be Determined  Discussed with: patient, patient's daughter, hospitalist, RN  Thank you for allowing the palliative care team to participate in the care [name].  Alvester Morin, DO Palliative Care Provider PMT # [phone]  If patient remains symptomatic despite maximum doses, please call PMT at [phone] between  0700 and 1900. Outside of these hours, please call attending, as PMT does not have night coverage.  Personally spent 36 minutes in patient care including extensive chart review (labs, imaging, progress/consult notes, vital signs), medically appropraite exam, discussed with treatment team, education to patient, family, and staff, documenting clinical information, medication review and management, coordination of care, and available advanced directive documents.   *Please note that this is a verbal dictation therefore any spelling or grammatical errors are due to the "Dragon Medical One" system interpretation.

## 2023-02-19 NOTE — Progress Notes (Signed)
SLP Cancellation Note  Patient Details Name: ALVETA SWADER MRN: [id number] DOB: [birth year]   Cancelled treatment:       Reason Eval/Treat Not Completed: Other (comment);Fatigue/lethargy limiting ability to participate (pt sleepy at this time and has Cortrak for nutrition, will continue efforts)   Chales Abrahams 02/19/2023, 10:47 AM  [name], MS [facility] SLP Acute Rehab Services Office [phone]

## 2023-02-19 NOTE — Progress Notes (Incomplete)
Referring Physician(s): * No referring provider recorded for this case *  Supervising Physician: {Supervising Physician:[id number]}  Patient Status:  {IR Patient Status:21574}  Chief Complaint:  ***  Subjective:  ***  Allergies: Celecoxib, Duloxetine hcl, Tizanidine, Venlafaxine, Effersyllium [psyllium], Escitalopram, Neurontin [gabapentin], and Tramadol  Medications: Prior to Admission medications   Medication Sig Start Date End Date Taking? Authorizing Provider  morphine (MS CONTIN) 30 MG 12 hr tablet Take 30 mg by mouth every 8 (eight) hours.   Yes [provider]  pregabalin (LYRICA) 300 MG capsule Take 300 mg by mouth in the morning and at bedtime. 01/03/20  Yes [provider]  carvedilol (COREG) 3.125 MG tablet Take 1 tablet (3.125 mg total) by mouth 2 (two) times daily with a meal. Patient not taking: Reported on 02/13/2023 07/03/21   Leroy Sea, MD  cyclobenzaprine (FLEXERIL) 10 MG tablet Take 1 tablet (10 mg total) by mouth at bedtime as needed for muscle spasms. Patient not taking: Reported on 02/13/2023 07/03/21   Leroy Sea, MD  morphine (MS CONTIN) 15 MG 12 hr tablet Take 1 tablet (15 mg total) by mouth every 12 (twelve) hours. Patient not taking: Reported on 02/13/2023 07/03/21   Leroy Sea, MD     Vital Signs: BP 134/69 (BP Location: Left Arm)   Pulse (!) 106   Temp 97.6 F (36.4 C) (Axillary)   Resp (!) 28   Ht 5\' 5"  (1.651 m)   Wt 111 lb 1.8 oz (50.4 kg)   SpO2 98%   BMI 18.49 kg/m   Physical Exam  Imaging: DG Abd 1 View  Result Date: 02/18/2023 CLINICAL DATA:  Feeding tube placement. EXAM: ABDOMEN - 1 VIEW COMPARISON:  None Available. FINDINGS: Tip of the weighted enteric tube in the right upper quadrant in the region of the distal stomach or proximal duodenum. Drainage catheter in the left upper quadrant/lung base. Small amount of air in the adjacent soft tissues. IMPRESSION: Tip of the weighted enteric tube in the  right upper quadrant in the region of the distal stomach or proximal duodenum. Electronically Signed   By: Narda Rutherford M.D.   On: 02/18/2023 18:07   DG Chest Port 1 View  Result Date: 02/18/2023 CLINICAL DATA:  Chest tube. EXAM: PORTABLE CHEST 1 VIEW COMPARISON:  February 17, 2023. FINDINGS: There is been interval placement of another left-sided chest tube more superiorly. Pleural effusion is significantly smaller, although some degree of pneumothorax is now seen. IMPRESSION: Interval placement of another left-sided chest tube with decreased left pleural effusion, but small pneumothorax now seen. Electronically Signed   By: Lupita Raider M.D.   On: 02/18/2023 07:39   CT CHEST WO CONTRAST  Result Date: 02/17/2023 CLINICAL DATA:  LEFT chest empyema EXAM: CT CHEST WITHOUT CONTRAST TECHNIQUE: Multidetector CT imaging of the chest was performed following the standard protocol without IV contrast. RADIATION DOSE REDUCTION: This exam was performed according to the departmental dose-optimization program which includes automated exposure control, adjustment of the mA and/or kV according to patient size and/or use of iterative reconstruction technique. COMPARISON:  Chest XR, earlier same day. CT chest, 02/13/2023. US Thyroid, 07/11/2020. FINDINGS: Cardiovascular: No significant vascular findings. Normal heart size. No pericardial effusion. Mediastinum/Nodes: No enlarged mediastinal or axillary lymph nodes. 1.5 cm LEFT inferior thyroid nodule. Trachea, and esophagus demonstrate no significant findings. Lungs/Pleura: Nodular and tree-in-bud opacities at the RIGHT basilar lung. The RIGHT lung is otherwise clear. Moderate-to-large volume loculated-appearing, LEFT lateral pleural effusion, measuring  11.0 x 4.0 x 20.0 cm. Additional intrapulmonary component, measuring approximately 3.0 x 3.0 x 11.0 cm. Atelectatic LEFT lung.  No pneumothorax. Pigtail catheter positioned at the LEFT basilar chest, and suspected  subdiaphragmatic in location. See key image. Upper Abdomen: Nodular contour liver with small volume of perihepatic ascites. No acute abnormality. Musculoskeletal: No acute chest wall mass. No acute osseous abnormality. IMPRESSION: 1. Moderate-to-large volume loculated LEFT pleural effusion, measuring up to 20 cm in greatest dimension. 2. LEFT basilar pigtail drainage catheter appears sub-diaphragmatic in location. Consider removal. 3. RIGHT basilar nodular pulmonary opacities, consistent with multifocal pneumonia. 4. Cirrhotic morphology liver with small volume perihepatic ascites 5. Incidental 1.5 cm LEFT inferior thyroid nodule measuring 1.5 cm. Follow-up with a non-emergent, outpatient thyroid ultrasound. Reference: J Am Coll Radiol. 2015 Feb;12(2): 143-50 These results were called by telephone at the time of interpretation on 02/17/2023 to provider Janeann Forehand ICU APP, who verbally acknowledged these results. Roanna Banning, MD [facility] Electronically Signed   By: Roanna Banning M.D.   On: 02/17/2023 17:46   CT [facility] PLEURAL DRAIN W/INDWELL CATH W/IMG GUIDE  Result Date: 02/17/2023 INDICATION: 142230 Pleural effusion 142230 EXAM: CT-GUIDED LEFT NON-TUNNELED PLEURAL DRAINAGE CATHETER PLACEMENT COMPARISON:  CT chest and chest XR, earlier same day. MEDICATIONS: The patient is currently admitted to the hospital and receiving intravenous antibiotics. The antibiotics were administered within an appropriate time frame prior to the initiation of the procedure. ANESTHESIA/SEDATION: Moderate (conscious) sedation was employed during this procedure. A total of Versed 1 mg and Fentanyl 75 mcg was administered intravenously. Moderate Sedation Time: 23 minutes. The patient's level of consciousness and vital signs were monitored continuously by radiology nursing throughout the procedure under my direct supervision. CONTRAST:  None COMPLICATIONS: None immediate.  PROCEDURE: RADIATION DOSE REDUCTION: This exam was performed according to the departmental dose-optimization program which includes automated exposure control, adjustment of the mA and/or kV according to patient size and/or use of iterative reconstruction technique. Informed written consent was obtained from the patient and/or patient's representative after a discussion of the risks, benefits and alternatives to treatment. The patient was placed supine on the CT gantry and a pre procedural CT was performed re-demonstrating the known abscess/fluid collection within the LEFT chest. The procedure was planned. A timeout was performed prior to the initiation of the procedure. The LEFT lateral chest was prepped and draped in the usual sterile fashion. The overlying soft tissues were anesthetized with 1% lidocaine with epinephrine. Appropriate trajectory was planned with the use of a 22 gauge spinal needle. An 18 gauge trocar needle was advanced into the abscess/fluid collection and a short Amplatz super stiff wire was coiled within the collection. Appropriate positioning was confirmed with a limited CT scan. The tract was serially dilated allowing placement of a 14 Fr drainage catheter. Appropriate positioning was confirmed with a limited postprocedural CT scan. 25 mL of purulent fluid was aspirated. The tube was connected to a pleura vac and sutured in place. A dressing was placed. The patient tolerated the procedure well without immediate post procedural complication. IMPRESSION: Successful CT guided placement of a 14 Fr non tunneled pleural drain catheter into the LEFT chest with aspiration of 25 mL of purulent fluid. Samples were sent to the laboratory as requested by the ordering clinical team. PLAN: - Fibrinolysis per Pulmonary team. Roanna Banning, MD [facility] Electronically Signed   By: Roanna Banning M.D.   On: 02/17/2023 16:53  DG Chest 1 View  Result  Date: 02/17/2023 CLINICAL DATA:  Chest tube. EXAM: CHEST  1 VIEW COMPARISON:  February 16, 2023. FINDINGS: Stable position of left-sided chest tube. Stable probably loculated left pleural effusion. IMPRESSION: Stable left-sided chest tube and probably loculated left pleural effusion. Electronically Signed   By: Lupita Raider M.D.   On: 02/17/2023 11:30   DG Chest 1 View  Result Date: 02/16/2023 CLINICAL DATA:  Pleural effusion EXAM: CHEST  1 VIEW COMPARISON:  02/15/2023. FINDINGS: Large left-sided pleural effusion much of which is loculated left basilar chest tube is stable finding right lung clear. No pneumothorax. Aorta is calcified. IMPRESSION: Persistent large left-sided effusion. Electronically Signed   By: Layla Maw M.D.   On: 02/16/2023 08:14   DG Chest Port 1 View  Result Date: 02/15/2023 CLINICAL DATA:  Shortness of breath. EXAM: PORTABLE CHEST 1 VIEW COMPARISON:  Earlier chest radiograph dated 02/15/2023. FINDINGS: Left-sided chest tube in similar position. No significant interval change in the size of left pleural effusion since the earlier radiograph. The right lung is clear. No pneumothorax. Stable cardiac silhouette. No acute osseous pathology. IMPRESSION: No significant interval change since the earlier radiograph. Electronically Signed   By: Elgie Collard M.D.   On: 02/15/2023 17:58    Labs:  CBC: Recent Labs    02/15/23 0239 02/16/23 0330 02/17/23 0255 02/18/23 0307  WBC 4.8 5.5 5.3 11.0*  HGB 11.8* 12.0 11.9* 12.0  HCT 35.9* 36.0 35.5* 36.6  PLT 41* 43* 44* 63*    COAGS: Recent Labs    02/13/23 1022 02/13/23 1746 02/14/23 0847 02/17/23 0255  INR 2.0* 2.9* 2.3* 2.3*    BMP: Recent Labs    02/16/23 0331 02/17/23 0255 02/18/23 0307 02/19/23 0319  NA 133* 134* 130* 134*  K 4.6 3.7 3.4* 4.0  CL 104 106 106 107  CO2 21* 19* 16* 19*  GLUCOSE 118* 109* 98 118*  BUN 14 11 9 14   CALCIUM 7.4* 7.0* 6.7* 7.0*  CREATININE 0.67 0.55 0.51 0.58   GFRNONAA >60 >60 >60 >60    LIVER FUNCTION TESTS: Recent Labs    02/13/23 1022 02/14/23 0309 02/15/23 0239 02/16/23 0331 02/17/23 0255  BILITOT 1.5* 1.7*  --   --   --   AST 203* 100*  --   --   --   ALT 79* 48*  --   --   --   ALKPHOS 163* 106  --   --   --   PROT 6.7 5.2*  --   --   --   ALBUMIN 2.5* 2.3* 2.1* 2.2* 2.0*    Assessment and Plan:  ***  Electronically Signed: Sable Feil, PA-C 02/19/2023, 9:05 AM   I spent a total of {Evaluation Minutes:[id number]} at the the patient's bedside AND on the patient's hospital floor or unit, greater than 50% of which was counseling/coordinating care for ***

## 2023-02-19 NOTE — Progress Notes (Signed)
HOSPITALIST ROUNDING NOTE Anita Price BMW:[id number]  DOB: Jul 10, 1955  DOA: 02/13/2023  PCP: Jackie Plum, MD  02/19/2023,9:09 AM   LOS: 6 days      Code Status: Full code From: Home  current Dispo: Unclear     67 year old white female Chronic hepatitis C which has been untreated in the past--underlying cirrhosis, tobacco abuse Chronic pain syndrome due to chronic back pain--- underlying cervical radiculopathy status post C3-C6 ACDF Dr. Yevette Edwards--- follows with pain clinic in [place] is debating spinal cord stimulator trial Degenerative joint disease HTN Previous thyroid nodule  Presented to emergency room 02/13/2019 for generalized weakness anorexia emesis-she was confused disoriented to time and date Workup revealed large loculated left pleural effusion endometrial thickening splenic infarcts  11/14: Admitted on antibiotics, thoracentesis with purulent material 11/15: Pulmonology consulted, chest tube inserted 11/16: Breathing worse; Up to 35L HFNC; Lytics given 11/17: IR consulted for second chest tube 11/18: Second chest tube placed 11/19: Dobbhoff Cortrak placed--- palliative medicine consulted additionally for goals of care--remains full code     Significant studies: 11/14 CT head: NAD 11/14 CT chest: large left pleural effusion 11/15 Echo: Normal EF, normal valves 11/18 CT chest repeat:      Significant microbiology data: 11/14 Bcx x1: NGTD 11/14 pleural fluid: Strep intermedius     Procedures: 11/14: Thoracentesis 11/15: Chest tube insertion 11/16: Pleural lytics     Plan  Sepsis on admission secondary to strep intermedius empyema left-sided with large loculated effusion status post chest tube as above Thrombocytopenia Untreated hep C with cirrhosis Management as per pulmonary-output from tubes = about 1000 cc yesterday--blood culture NGTD--- is at suction 20 cm and has drainage system additionally Continues on Ancef--- repeat WBC a.m.--- stop checking  lactic acid-she has an empyema but is not currently septic CXR this morning my over read shows slight clearing of left upper lung fields, nonspecific findings on the right cardiac border--there is a small pneumothorax as well  Acute metabolic encephalopathy secondary to sepsis Not felt to be secondary to hepatic encephalopathy-initially lactulose, Xifaxan but held because persistent diarrhea, was on diuretics which were held Trial with core track --has not eaten much today--- see if can maintain appetite Follow Chem-12 INR a.m.  Severe protein energy malnutrition on dysphagia diet Continue Osmolite 45 cc/H Prosource 60 thiamine 100 daily She has diarrhea related to tube feeds and I will give a dose of Imodium today  Electrolyte abnormalities Hyponatremia has resolved Hypokalemia replaced, stop D5, potassium previously replaced-monitor labs Replace magnesium aggressively-2 g now and repeat in a.m. Check albumin in a.m.-probably has malnutrition so corrected calcium would be normal  Chronic pain syndrome from ACDF follows with pain clinic Continues on  MS IR 7.5 every 4 as needed--- home dose is 30 every 8 so we will hold high doses for now, Lyrica 300 twice daily, Flexeril 10 at bedtime and MS Contin 15 every 12 all held  Degenerative joint disease NOS  Endometrial thickening Needs outpatient characterization not emergently  Thyroid nodule Needs outpatient characterization not emergently   DVT prophylaxis: SCD  Status is: Inpatient Remains inpatient appropriate because:   Requires further improvement of p.o. intake, requires improvement of effusions and discussion about longer-term management    Subjective: Quiet coherent x 1 is able to tell me that she is at [facility] but cannot tell me any further orienting information Does not seem to be in pain No family is present today  Objective + exam Vitals:   02/19/23 0717 02/19/23 0725 02/19/23  0800 02/19/23 0837  BP:    134/69   Pulse: (!) 104 (!) 102 (!) 104 (!) 106  Resp: (!) 28 (!) 27 (!) 26 (!) 28  Temp:      TempSrc:      SpO2: 98% 99% 99% 98%  Weight:      Height:       Filed Weights   02/13/23 1700 02/13/23 2030 02/19/23 0413  Weight: 49.9 kg 46.9 kg 50.4 kg    Examination:  EOMI NCAT no focal deficit Chest tubes in place on the left side S1-S2 normal-on monitors seems to be in sinus with some small runs of SVT and PVCs Abdomen soft nontender no rebound no guarding she has a Flexi-Seal in additionally No lower extremity edema Rest of skin exam not examined   Data Reviewed: reviewed   CBC    Component Value Date/Time   WBC 11.0 (H) 02/18/2023 0307   RBC 3.93 02/18/2023 0307   HGB 12.0 02/18/2023 0307   HCT 36.6 02/18/2023 0307   PLT 63 (L) 02/18/2023 0307   MCV 93.1 02/18/2023 0307   MCH 30.5 02/18/2023 0307   MCHC 32.8 02/18/2023 0307   RDW 15.9 (H) 02/18/2023 0307   LYMPHSABS 1.8 02/18/2023 0307   MONOABS 0.7 02/18/2023 0307   EOSABS 0.2 02/18/2023 0307   BASOSABS 0.1 02/18/2023 0307      Latest Ref Rng & Units 02/19/2023    3:19 AM 02/18/2023    3:07 AM 02/17/2023    2:55 AM  CMP  Glucose 70 - 99 mg/dL 161  98  096   BUN 8 - 23 mg/dL 14  9  11    Creatinine 0.44 - 1.00 mg/dL 0.45  4.09  8.11   Sodium 135 - 145 mmol/L 134  130  134   Potassium 3.5 - 5.1 mmol/L 4.0  3.4  3.7   Chloride 98 - 111 mmol/L 107  106  106   CO2 22 - 32 mmol/L 19  16  19    Calcium 8.9 - 10.3 mg/dL 7.0  6.7  7.0      Scheduled Meds:  carvedilol  3.125 mg Oral BID WC   Chlorhexidine Gluconate Cloth  6 each Topical Daily   mouth rinse  15 mL Mouth Rinse 4 times per day   pantoprazole  40 mg Oral Daily   potassium chloride  40 mEq Oral BID   sodium chloride flush  10 mL Intravenous Q12H   sodium chloride flush  10 mL Intrapleural Q8H   thiamine  100 mg Per Tube Daily   Continuous Infusions:   ceFAZolin (ANCEF) IV Stopped (02/19/23 0644)   dextrose 5 % and 0.9 % NaCl with KCl 20 mEq/L  50 mL/hr at 02/19/23 0714   feeding supplement (OSMOLITE 1.5 CAL) 10 mL/hr at 02/19/23 9147    Time  45  Rhetta Mura, MD  [facility]

## 2023-02-19 NOTE — Progress Notes (Signed)
  Interdisciplinary Goals of Care Family Meeting   Date carried out: 02/19/2023  Location of the meeting: Phone conference  Member's involved: Physician and Family Member or next of kin  Durable Power of Attorney or Environmental health practitioner: Discussed with the daughter on the phone briefly going over the plan of care  Discussion: We discussed goals of care for Anita Price .   It looks like palliative care is spoken with the patient's daughter-I called additionally gave her an update medically-I impressed upon her the gravity of illness and her intermittent encephalopathy-while we are hopeful that this will resolve, it is not certain and if mental status does not clear in the next 24 to 48 hours we may need to de-escalate other meds Her urine output is dropped a little bit according to nursing-I have started back fluids-she is full code  Code status:   Code Status: Full Code   Disposition: Continue current acute care  Time spent for the meeting: 20    Rhetta Mura, MD  02/19/2023, 5:38 PM

## 2023-02-20 DIAGNOSIS — K7460 Unspecified cirrhosis of liver: Secondary | ICD-10-CM | POA: Diagnosis not present

## 2023-02-20 DIAGNOSIS — Z7189 Other specified counseling: Secondary | ICD-10-CM | POA: Diagnosis not present

## 2023-02-20 DIAGNOSIS — E440 Moderate protein-calorie malnutrition: Secondary | ICD-10-CM

## 2023-02-20 DIAGNOSIS — A419 Sepsis, unspecified organism: Secondary | ICD-10-CM | POA: Diagnosis not present

## 2023-02-20 DIAGNOSIS — J869 Pyothorax without fistula: Secondary | ICD-10-CM | POA: Diagnosis not present

## 2023-02-20 DIAGNOSIS — R4589 Other symptoms and signs involving emotional state: Secondary | ICD-10-CM | POA: Diagnosis not present

## 2023-02-20 LAB — CBC
HCT: 37 % (ref 36.0–46.0)
Hemoglobin: 12.2 g/dL (ref 12.0–15.0)
MCH: 30.3 pg (ref 26.0–34.0)
MCHC: 33 g/dL (ref 30.0–36.0)
MCV: 91.8 fL (ref 80.0–100.0)
Platelets: 77 10*3/uL — ABNORMAL LOW (ref 150–400)
RBC: 4.03 MIL/uL (ref 3.87–5.11)
RDW: 16.2 % — ABNORMAL HIGH (ref 11.5–15.5)
WBC: 11 10*3/uL — ABNORMAL HIGH (ref 4.0–10.5)
nRBC: 0.3 % — ABNORMAL HIGH (ref 0.0–0.2)

## 2023-02-20 LAB — COMPREHENSIVE METABOLIC PANEL
ALT: 17 U/L (ref 0–44)
AST: 69 U/L — ABNORMAL HIGH (ref 15–41)
Albumin: 1.9 g/dL — ABNORMAL LOW (ref 3.5–5.0)
Alkaline Phosphatase: 124 U/L (ref 38–126)
Anion gap: 7 (ref 5–15)
BUN: 15 mg/dL (ref 8–23)
CO2: 20 mmol/L — ABNORMAL LOW (ref 22–32)
Calcium: 7.1 mg/dL — ABNORMAL LOW (ref 8.9–10.3)
Chloride: 104 mmol/L (ref 98–111)
Creatinine, Ser: 0.4 mg/dL — ABNORMAL LOW (ref 0.44–1.00)
GFR, Estimated: >60 mL/min (ref >60)
Glucose, Bld: 115 mg/dL — ABNORMAL HIGH (ref 70–99)
Potassium: 3.7 mmol/L (ref 3.5–5.1)
Sodium: 131 mmol/L — ABNORMAL LOW (ref 135–145)
Total Bilirubin: 0.8 mg/dL (ref <1.2)
Total Protein: 5.8 g/dL — ABNORMAL LOW (ref 6.5–8.1)

## 2023-02-20 LAB — GLUCOSE, CAPILLARY
Glucose-Capillary: 104 mg/dL — ABNORMAL HIGH (ref 70–99)
Glucose-Capillary: 111 mg/dL — ABNORMAL HIGH (ref 70–99)
Glucose-Capillary: 120 mg/dL — ABNORMAL HIGH (ref 70–99)
Glucose-Capillary: 123 mg/dL — ABNORMAL HIGH (ref 70–99)
Glucose-Capillary: 105 mg/dL — ABNORMAL HIGH (ref 70–99)

## 2023-02-20 LAB — PROTIME-INR
INR: 1.6 — ABNORMAL HIGH (ref 0.8–1.2)
Prothrombin Time: 19 s — ABNORMAL HIGH (ref 11.4–15.2)

## 2023-02-20 LAB — MAGNESIUM: Magnesium: 1.8 mg/dL (ref 1.7–2.4)

## 2023-02-20 LAB — PHOSPHORUS: Phosphorus: 2.1 mg/dL — ABNORMAL LOW (ref 2.5–4.6)

## 2023-02-20 MED ORDER — OSMOLITE 1.5 CAL PO LIQD
1000.0000 mL | ORAL | Status: DC
  Administered 2023-02-23 – 2023-02-24 (×2): 1000 mL
  Filled 2023-02-20 (×3): qty 1000

## 2023-02-20 MED ORDER — SODIUM CHLORIDE 0.9 % IV SOLN: INTRAVENOUS | Status: AC

## 2023-02-20 NOTE — Progress Notes (Signed)
Daily Progress Note   Patient Name: Anita Price       Date: 02/20/2023 DOB: 1955/07/13  Age: 67 y.o. MRN#: [id number] Attending Physician: Rhetta Mura, MD Primary Care Physician: Jackie Plum, MD Admit Date: 02/13/2023 Length of Stay: 7 days  Reason for Consultation/Follow-up: Establishing goals of care  Subjective:   CC: Patient appreciated daughter's visit yesterday. Following up regarding complex medical decision making.   Subjective:  Reviewed EMR prior to presenting to bedside.  Discussed care with bedside RN for updates.  Patient had significant output from distal chest tube of over 1 L overnight.  PCCM provider already evaluated and planning for repeat chest CT today for evaluation.  Patient's mentation continues to wax and wane.  Presented to bedside to meet with patient.  Patient laying awake in bed.  Again introduced myself as a member of the palliative medicine team.  Patient able to answer short simple questions.  Patient does acknowledge that she was grateful her daughter was able to visit yesterday.  Inquired further about patient's overall known health assessment.  Patient notes she has problems with her liver though is "not sure" what caused it.  Inquired how long she has had liver problems and she is unable to state this either.  Patient does acknowledge that she has been reluctant to seek medical care in the past which her daughter had stated she has been.  Patient denied any symptoms of concern at this time.  Patient also acknowledges that she wants to work to get stronger to have more time with her grandsons.  Noted will continue to speak to her and daughter regarding her overall care.  To call daughter to update on medical care for the day.  Noted CT being obtained due to chest tube output.  Inquired about daughter's visit with patient yesterday.  Daughter felt patient "perked up" when she and his grandson came to bedside.  Daughter did try to encourage  patient to eat though patient would not eat even with her.  Again expressed concerns about patient's overall frailty.  Daughter acknowledged this while still expressing hope patient will "get better".  Spent time providing emotional support.  Noted palliative medicine team will continue following along with patient's medical journey.  Updated IDT after visit.  Objective:   Vital Signs:  BP 123/86 (BP Location: Left Arm)   Pulse 97   Temp 97.6 F (36.4 C) (Oral)   Resp (!) 30   Ht 5\' 5"  (1.651 m)   Wt 53 kg   SpO2 99%   BMI 19.44 kg/m   Physical Exam: General: NAD, chronically ill appearing, cachectic, frail  HENT: poor dentition Cardiovascular: RRR Respiratory: not in respiratory distress, increased work of breathing noted  Neuro: minimally interactive (though slightly improved from 11/19 examination for this provider)  Imaging: I personally reviewed recent imaging.   Assessment & Plan:   Assessment: Patient is a 67 year old female with a past medical history of cirrhosis in the setting of untreated hepatitis C, uncomplicated opioid dependence, protein calorie malnutrition, and hypertension who was admitted on 02/13/2023 for management of vomiting and decreased mentation.  During admission patient was found to have empyema and sepsis secondary to Streptococcus.  Patient has received management for empyema including antibiotics and chest tube placement.  Patient not a candidate for VATS.  Pulmonology following recommendations.  Patient also receiving management for AKI and encephalopathy.  Palliative medicine team consulted to assist with complex medical decision making.   Recommendations/Plan: # Complex medical decision  making/goals of care:  -Patient minimally participating in discussions. Unable to engage in complex medical decision making.                 -Discussed care with patient's NOK/daughter, [name], as detailed above in HPI.  Melissa already noted this is  difficult to process because patient was "functional" prior to this hospitalization including looking after her grandchildren.  Melissa continues to express hope for overall improvement as does nor want patient's nephews to loose their grandmother since already lost their father (patient's son). Continue to discuss and express concerns about patient's overall medical health.    -Melissa already stated that she had to make the difficult decision to "remove" her brother from life support when he died and as such wants to continue full scope of therapies/full code for patient.  - Code Status: Full Code    # Psycho-social/Spiritual Support:  - Support System: daughter; patient's son is deceased    # Discharge Planning:  To Be Determined  Discussed with: patient, patient's daughter, hospitalist, RN  Thank you for allowing the palliative care team to participate in the care [name].  Alvester Morin, DO Palliative Care Provider PMT # [phone]  If patient remains symptomatic despite maximum doses, please call PMT at [phone] between 0700 and 1900. Outside of these hours, please call attending, as PMT does not have night coverage.  *Please note that this is a verbal dictation therefore any spelling or grammatical errors are due to the "[employer]" system interpretation.

## 2023-02-20 NOTE — Progress Notes (Signed)
NAME:  Anita Price, MRN:  [id number], DOB:  October 19, 1955, LOS: 7 ADMISSION DATE:  02/13/2023, CONSULTATION DATE:  02/13/23 REFERRING MD:  Robb Matar - TRH, CHIEF COMPLAINT:  weakness6   History of Present Illness:  67 yo F PMH hep C, cirrhosis, HE, chronic pain, prolonged qtc who presented to [facility] ED 11/14 for 3-4d hx weakness + anorexia, n/v. In ED she was noted to be altered, unable to provide much reliable history. Had a CXR which showed L effusion. Underwent CT H c/a/p -- no intracranial abnormality, large L loculated pleural effusion, new thickening of endometrium, liver cirrhosis, splenomegaly, multiple splenic infarcts. Her labs revealed an AKI, meta cidosis, elevated LFTs, electrolyte disturbances, elevated ammonia, lactic acidosis, thrombocytopenia and coagulopathy. Mild leukocytosis. Started on vanc cefepime flagyl.  Underwent thora with IR, 1L milky purulent fluid off. Sent for cx. CXR post procedure with some residual effusion.  PCCM consulted in this setting, for pulm consult to see 11/15 AM.    In d/w daughter 11/15, this came out of nowhere a few days ago. Usually takes care of nephews and is functioning well w her chronic dz processes. Has had encephalopathy in the past r/t infections. Baseline opioid dependence -- follows w pain clinic in [place] & is considering a SCS trial   Pertinent  Medical History  Chronic pain DJD Prolonged qtc Protein calorie malnutrition Cirrhosis Hep C Hepatic encephalopathy   Significant Hospital Events: Including procedures, antibiotic start and stop dates in addition to other pertinent events   11/14 Admit to [facility]. IR thora.  11/15 pccm for chest tube, empyema LD> 10K, 71K TNC, 100 neutrophils and was turbid appearing   11/18 775 out from chest tube in the last 24hrs. S/p left apical chest tube placed by IR 11/19 Last 24 hours chest tube out from left lateral 840 cc and left apical 526 cc 11/20 24hr output 160 from lateral tube, 350 from anterior  tube.   Interim History / Subjective:  No complaints Significant output from the more distal of the chest tubes over 1 liter per RN The cephalad tube has 40 mL out in the last 24 hours per RN  Objective   Blood pressure 123/86, pulse 97, temperature 97.6 F (36.4 C), temperature source Oral, resp. rate (!) 30, height 5\' 5"  (1.651 m), weight 53 kg, SpO2 99%.        Intake/Output Summary (Last 24 hours) at 02/20/2023 0917 Last data filed at 02/20/2023 0800 Gross per 24 hour  Intake 1970.1 ml  Output 2220 ml  Net -249.9 ml   Filed Weights   02/13/23 2030 02/19/23 0413 02/20/23 0500  Weight: 46.9 kg 50.4 kg 53 kg   Physical Exam: General: Frail middle aged female in NAD HEENT: [place]/AT, PERRL, no JVD Respiratory: Clear bilateral breath sounds.  Cardiovascular: RRR, no MRG GI: BS+, soft, nontender, non-distended Extremities:-no acute deformity or ROM limitation Neuro: Alert, oriented, non-focal, drowsy  WBC 11 Plt 77 K 3.7 Mag 1.8  Resolved Hospital Problem list     Assessment & Plan:   Strep intermedius empyema on left Acute encephalopathy 2/2 sepsis Chronic opiate dependent pain Dysphagia Frail Severe protein calorie malnutrition Hep C cirrhosis w/ splenic sequestration of platelets Thora 11/14: 1 liter milky white output Chest tube 11/15 > lytics 11/15 and 11/16 without much response.  Additional apical chest tube placed 11/18  11/19  - 24 hour output 40 mL from caudad tube and 1290 mL from cephalad tube P: Continue Cefazolin, x4 weeks. Chest tube  care/flushes per protocol. Will repeat CT chest today considering ongoing high output from chest tubes. Continue to monitor chest tube output. If output from the cephalad tube remains low we can consider discontinuing tomorrow, but with up and down output will be more conservative.    Joneen Roach, AGACNP-BC [facility]  See Amion for personal pager PCCM on call pager [phone] until  7pm. Please call Elink 7p-7a. [phone]  02/20/2023 9:17 AM

## 2023-02-20 NOTE — Progress Notes (Signed)
HOSPITALIST ROUNDING NOTE Anita Price SAY:[id number]  DOB: Mar 10, 1956  DOA: 02/13/2023  PCP: Jackie Plum, MD  02/20/2023,5:21 PM   LOS: 7 days      Code Status: Full code From: Home  current Dispo: Unclear     67 year old white female Chronic hepatitis C which has been untreated in the past--underlying cirrhosis, tobacco abuse Chronic pain syndrome due to chronic back pain--- underlying cervical radiculopathy status post C3-C6 ACDF Dr. Yevette Edwards--- follows with pain clinic in [place] is debating spinal cord stimulator trial Degenerative joint disease HTN Previous thyroid nodule  Presented to emergency room 02/13/2019 for generalized weakness anorexia emesis-she was confused disoriented to time and date Workup revealed large loculated left pleural effusion endometrial thickening splenic infarcts  11/14: Admitted on antibiotics, thoracentesis with purulent material 11/15: Pulmonology consulted, chest tube inserted 11/16: Breathing worse; Up to 35L HFNC; Lytics given 11/17: IR consulted for second chest tube 11/18: Second chest tube placed 11/19: Dobbhoff Cortrak placed--- palliative medicine consulted additionally for goals of care--remains full code     Significant studies: 11/14 CT head: NAD 11/14 CT chest: large left pleural effusion 11/15 Echo: Normal EF, normal valves 11/18 CT chest repeat moderate large volume left pleural effusion 20 cm greatest dimension, left base pigtail catheter drainage subscribe pragmatic right basilar nodular pulmonary opacities multifocal pneumonia     Significant microbiology data: 11/14 Bcx x1: NGTD 11/14 pleural fluid: Strep intermedius     Procedures: 11/14: Thoracentesis 11/15: Chest tube insertion 11/16: Pleural lytics     Plan  Sepsis on admission secondary to strep intermedius empyema left-sided with large loculated effusion status post chest tube as above Thrombocytopenia Untreated hep C with cirrhosis Management as per  pulmonary-output from tubes = continues to be high (>800 cc total) CT chest ordered for tomorrow--- pulmonology managing Continues on Ancef  Abd pain Get CT abd with contrast if possible in am---back down off of feeds to 20cc/h If white count still up in am, would check lactoferrin/cdiff etc  Acute metabolic encephalopathy secondary to sepsis Not felt to be secondary to hepatic encephalopathy-initially lactulose, Xifaxan but held because persistent diarrhea,  Diuretics on hold  Trial with core track --not eating much mainly core track feeds which might be causing the diarrhea Follow Chem-12 INR a.m.  Severe protein energy malnutrition on dysphagia diet Cut back Osmolite to 20 cc Prosource 60 thiamine 100 daily Continue saline 40 cc/H  Electrolyte abnormalities, metabolic acidosis--acidosis is improved Hyponatremia persistent-on fluids Hypokalemia replaced, stop D5, potassium previously replaced-monitor labs Check albumin in a.m.-probably has malnutrition so corrected calcium would be normal  Impaired glucose tolerance likely secondary to feeds-no workup unless above 180  Chronic pain syndrome from ACDF follows with pain clinic Continues on  MS IR 7.5 every 4 as needed--- home dose is 30 every 8 so we will hold high doses for now, Lyrica 300 twice daily, Flexeril 10 at bedtime and MS Contin 15 every 12 all held  Degenerative joint disease NOS  Endometrial thickening Needs outpatient characterization not emergently  Thyroid nodule Needs outpatient characterization not emergently   DVT prophylaxis: SCD  Status is: Inpatient Remains inpatient appropriate because:   Requires further improvement of p.o. intake, requires improvement of effusions and discussion about longer-term management    Subjective:  Breathing is impaired-she is not really responsive but opens her eyes and responds with one-word answers Continues to have diarrhea has Foley in place Her abdomen is little  bit more distended today and is tender on light palpation  I reviewed the nursing notes and discussed with nursing personally reviewed multiple   Objective + exam Vitals:   02/20/23 1448 02/20/23 1524 02/20/23 1600 02/20/23 1700  BP:   135/84   Pulse: 88 86 96 95  Resp: (!) 25 (!) 34 (!) 35 (!) 30  Temp:   (!) 97.3 F (36.3 C)   TempSrc:   Oral   SpO2: 100% 99% 100% 100%  Weight:      Height:       Filed Weights   02/13/23 2030 02/19/23 0413 02/20/23 0500  Weight: 46.9 kg 50.4 kg 53 kg    Examination:  EOMI NCAT no focal deficit Chest tubes in place on the left side draining fairly well S1-S2 normal-on monitors seems to be in sinus with some small runs of SVT and PVCs Abdomen is slightly tender no rebound but is distended No lower extremity edema I did not examine skin   Data Reviewed: reviewed   CBC    Component Value Date/Time   WBC 11.0 (H) 02/20/2023 0254   RBC 4.03 02/20/2023 0254   HGB 12.2 02/20/2023 0254   HCT 37.0 02/20/2023 0254   PLT 77 (L) 02/20/2023 0254   MCV 91.8 02/20/2023 0254   MCH 30.3 02/20/2023 0254   MCHC 33.0 02/20/2023 0254   RDW 16.2 (H) 02/20/2023 0254   LYMPHSABS 1.8 02/18/2023 0307   MONOABS 0.7 02/18/2023 0307   EOSABS 0.2 02/18/2023 0307   BASOSABS 0.1 02/18/2023 0307      Latest Ref Rng & Units 02/20/2023    2:54 AM 02/19/2023    3:19 AM 02/18/2023    3:07 AM  CMP  Glucose 70 - 99 mg/dL 119  147  98   BUN 8 - 23 mg/dL 15  14  9    Creatinine 0.44 - 1.00 mg/dL 8.29  5.62  1.30   Sodium 135 - 145 mmol/L 131  134  130   Potassium 3.5 - 5.1 mmol/L 3.7  4.0  3.4   Chloride 98 - 111 mmol/L 104  107  106   CO2 22 - 32 mmol/L 20  19  16    Calcium 8.9 - 10.3 mg/dL 7.1  7.0  6.7   Total Protein 6.5 - 8.1 g/dL 5.8     Total Bilirubin <1.2 mg/dL 0.8     Alkaline Phos 38 - 126 U/L 124     AST 15 - 41 U/L 69     ALT 0 - 44 U/L 17        Scheduled Meds:  carvedilol  3.125 mg Per NG tube BID WC   Chlorhexidine Gluconate Cloth  6  each Topical Daily   feeding supplement (PROSource TF20)  60 mL Per Tube Daily   mouth rinse  15 mL Mouth Rinse 4 times per day   pantoprazole (PROTONIX) IV  40 mg Intravenous Q24H   thiamine  100 mg Per Tube Daily   Continuous Infusions:  sodium chloride      ceFAZolin (ANCEF) IV Stopped (02/20/23 1400)   feeding supplement (OSMOLITE 1.5 CAL) 20 mL/hr at 02/20/23 1700    Time  45  Rhetta Mura, MD  [facility]

## 2023-02-21 ENCOUNTER — Inpatient Hospital Stay (HOSPITAL_COMMUNITY): Payer: Medicare Other

## 2023-02-21 DIAGNOSIS — K7460 Unspecified cirrhosis of liver: Secondary | ICD-10-CM | POA: Diagnosis not present

## 2023-02-21 DIAGNOSIS — A419 Sepsis, unspecified organism: Secondary | ICD-10-CM | POA: Diagnosis not present

## 2023-02-21 DIAGNOSIS — Z7189 Other specified counseling: Secondary | ICD-10-CM | POA: Diagnosis not present

## 2023-02-21 DIAGNOSIS — E440 Moderate protein-calorie malnutrition: Secondary | ICD-10-CM | POA: Diagnosis not present

## 2023-02-21 DIAGNOSIS — G9340 Encephalopathy, unspecified: Secondary | ICD-10-CM | POA: Diagnosis not present

## 2023-02-21 DIAGNOSIS — Z515 Encounter for palliative care: Secondary | ICD-10-CM | POA: Diagnosis not present

## 2023-02-21 LAB — COMPREHENSIVE METABOLIC PANEL
ALT: 22 U/L (ref 0–44)
AST: 103 U/L — ABNORMAL HIGH (ref 15–41)
Albumin: 1.8 g/dL — ABNORMAL LOW (ref 3.5–5.0)
Alkaline Phosphatase: 138 U/L — ABNORMAL HIGH (ref 38–126)
Anion gap: 5 (ref 5–15)
BUN: 15 mg/dL (ref 8–23)
CO2: 22 mmol/L (ref 22–32)
Calcium: 7.2 mg/dL — ABNORMAL LOW (ref 8.9–10.3)
Chloride: 107 mmol/L (ref 98–111)
Creatinine, Ser: 0.43 mg/dL — ABNORMAL LOW (ref 0.44–1.00)
GFR, Estimated: >60 mL/min (ref >60)
Glucose, Bld: 108 mg/dL — ABNORMAL HIGH (ref 70–99)
Potassium: 4 mmol/L (ref 3.5–5.1)
Sodium: 134 mmol/L — ABNORMAL LOW (ref 135–145)
Total Bilirubin: 1 mg/dL (ref <1.2)
Total Protein: 5.6 g/dL — ABNORMAL LOW (ref 6.5–8.1)

## 2023-02-21 LAB — CBC
HCT: 37.2 % (ref 36.0–46.0)
Hemoglobin: 11.7 g/dL — ABNORMAL LOW (ref 12.0–15.0)
MCH: 29.9 pg (ref 26.0–34.0)
MCHC: 31.5 g/dL (ref 30.0–36.0)
MCV: 95.1 fL (ref 80.0–100.0)
Platelets: 83 10*3/uL — ABNORMAL LOW (ref 150–400)
RBC: 3.91 MIL/uL (ref 3.87–5.11)
RDW: 16.2 % — ABNORMAL HIGH (ref 11.5–15.5)
WBC: 14.7 10*3/uL — ABNORMAL HIGH (ref 4.0–10.5)
nRBC: 0.2 % (ref 0.0–0.2)

## 2023-02-21 LAB — GLUCOSE, CAPILLARY
Glucose-Capillary: 105 mg/dL — ABNORMAL HIGH (ref 70–99)
Glucose-Capillary: 105 mg/dL — ABNORMAL HIGH (ref 70–99)
Glucose-Capillary: 109 mg/dL — ABNORMAL HIGH (ref 70–99)
Glucose-Capillary: 88 mg/dL (ref 70–99)
Glucose-Capillary: 90 mg/dL (ref 70–99)
Glucose-Capillary: 95 mg/dL (ref 70–99)

## 2023-02-21 LAB — PROTIME-INR
INR: 1.3 — ABNORMAL HIGH (ref 0.8–1.2)
Prothrombin Time: 16.3 s — ABNORMAL HIGH (ref 11.4–15.2)

## 2023-02-21 MED ORDER — HEPARIN BOLUS VIA INFUSION
2900.0000 [IU] | Freq: Once | INTRAVENOUS | Status: AC
  Administered 2023-02-21: 2900 [IU] via INTRAVENOUS
  Filled 2023-02-21: qty 2900

## 2023-02-21 MED ORDER — SODIUM CHLORIDE 0.9% FLUSH
10.0000 mL | Freq: Three times a day (TID) | INTRAVENOUS | Status: DC
  Administered 2023-02-21 – 2023-02-23 (×7): 10 mL via INTRAPLEURAL

## 2023-02-21 MED ORDER — IOHEXOL 9 MG/ML PO SOLN
ORAL | Status: AC
  Filled 2023-02-21: qty 1000

## 2023-02-21 MED ORDER — LOPERAMIDE HCL 2 MG PO CAPS
2.0000 mg | ORAL_CAPSULE | Freq: Three times a day (TID) | ORAL | Status: DC
  Administered 2023-02-22: 2 mg via ORAL
  Filled 2023-02-21 (×2): qty 1

## 2023-02-21 MED ORDER — IOHEXOL 300 MG/ML  SOLN
100.0000 mL | Freq: Once | INTRAMUSCULAR | Status: AC | PRN
  Administered 2023-02-21: 100 mL via INTRAVENOUS

## 2023-02-21 MED ORDER — SPIRONOLACTONE 25 MG PO TABS
50.0000 mg | ORAL_TABLET | Freq: Every day | ORAL | Status: DC
  Administered 2023-02-21 – 2023-03-05 (×13): 50 mg via ORAL
  Filled 2023-02-21 (×14): qty 2

## 2023-02-21 MED ORDER — IOHEXOL 9 MG/ML PO SOLN
1000.0000 mL | ORAL | Status: AC
  Administered 2023-02-21: 1000 mL via ORAL

## 2023-02-21 MED ORDER — IOHEXOL 350 MG/ML SOLN
75.0000 mL | Freq: Once | INTRAVENOUS | Status: AC | PRN
  Administered 2023-02-21: 75 mL via INTRAVENOUS

## 2023-02-21 MED ORDER — HEPARIN (PORCINE) 25000 UT/250ML-% IV SOLN
1050.0000 [IU]/h | INTRAVENOUS | Status: DC
  Administered 2023-02-21: 850 [IU]/h via INTRAVENOUS
  Administered 2023-02-22: 1000 [IU]/h via INTRAVENOUS
  Administered 2023-02-24: 1050 [IU]/h via INTRAVENOUS
  Filled 2023-02-21 (×3): qty 250

## 2023-02-21 MED ORDER — BOOST / RESOURCE BREEZE PO LIQD CUSTOM
1.0000 | Freq: Three times a day (TID) | ORAL | Status: DC
  Administered 2023-02-21 – 2023-03-06 (×29): 1 via ORAL

## 2023-02-21 NOTE — Progress Notes (Addendum)
PCCM Progress Note  Lateral lower left chest tube removed. Suture x 1 placed. Post procedure CXR ordered

## 2023-02-21 NOTE — Progress Notes (Signed)
PHARMACY - ANTICOAGULATION CONSULT NOTE  Pharmacy Consult for IV heparin Indication: pulmonary embolus  Allergies  Allergen Reactions   Celecoxib Other (See Comments)    Insomnia   Duloxetine Hcl Other (See Comments)    Insomnia   Tizanidine Nausea And Vomiting   Venlafaxine Other (See Comments)    Hallucinations   Effersyllium [Psyllium] Other (See Comments)    Made the patient "feel badly"   Escitalopram Other (See Comments)    insomnia   Neurontin [Gabapentin] Other (See Comments)    Made the patient "feel badly"   Tramadol Other (See Comments)    Made the patient "feel badly"    Patient Measurements: Height: 5\' 5"  (165.1 cm) Weight: 49.4 kg (108 lb 14.5 oz) IBW/kg (Calculated) : 57 Heparin Dosing Weight: 49.4 kg   Vital Signs: Temp: 97.8 F (36.6 C) (11/22 2000) Temp Source: Oral (11/22 2000) BP: 112/60 (11/22 2000) Pulse Rate: 86 (11/22 2000)  Labs: Recent Labs    02/19/23 0319 02/20/23 0254 02/21/23 0316  HGB  --  12.2 11.7*  HCT  --  37.0 37.2  PLT  --  77* 83*  LABPROT  --  19.0* 16.3*  INR  --  1.6* 1.3*  CREATININE 0.58 0.40* 0.43*    Estimated Creatinine Clearance: 53.9 mL/min (A) (by C-G formula based on SCr of 0.43 mg/dL (L)).   Medical History: Past Medical History:  Diagnosis Date   Acute metabolic encephalopathy    Chronic back pain    DJD (degenerative joint disease)    Essential hypertension 07/10/2020   Gallbladder sludge    Hepatitis C 07/10/2020   Liver cirrhosis (HCC)    Pain management    Prolonged QT interval    Protein calorie malnutrition (HCC)    Thyroid nodule    Tobacco use 07/10/2020    Medications:  Medications Prior to Admission  Medication Sig Dispense Refill Last Dose   morphine (MS CONTIN) 30 MG 12 hr tablet Take 30 mg by mouth every 8 (eight) hours.   02/12/2023 at 2100   pregabalin (LYRICA) 300 MG capsule Take 300 mg by mouth in the morning and at bedtime.   02/12/2023 at pm   carvedilol (COREG) 3.125 MG  tablet Take 1 tablet (3.125 mg total) by mouth 2 (two) times daily with a meal. (Patient not taking: Reported on 02/13/2023) 60 tablet 0 Not Taking   cyclobenzaprine (FLEXERIL) 10 MG tablet Take 1 tablet (10 mg total) by mouth at bedtime as needed for muscle spasms. (Patient not taking: Reported on 02/13/2023) 30 tablet 0 Not Taking   morphine (MS CONTIN) 15 MG 12 hr tablet Take 1 tablet (15 mg total) by mouth every 12 (twelve) hours. (Patient not taking: Reported on 02/13/2023)   Not Taking    Assessment: Pharmacy is consulted to start heparin drip on 67 yo female diagnosed with PE. CT of chest today indicates right lower lobe segmental and subsegmental pulmonary emboli. No evidence of right heart strain. Pt not currently on anticoagulation.   Today, 02/21/23 Hgb 11.7, plt 83  INR elevated at 1.3 likely due to cirrhosis  Hx of cirrhosis    Goal of Therapy:  Heparin level 0.3-0.7 units/ml Monitor platelets by anticoagulation protocol: Yes   Plan:  Heparin 2900 unit bolus followed by heparin 850 units/hr  Obtain HL 8 hours after start of infusion  Daily CBC, HL  Monitor for signs and symptoms of bleeding   Adalberto Cole, PharmD, BCPS 02/21/2023 9:28 PM

## 2023-02-21 NOTE — Progress Notes (Signed)
Nutrition Follow-up  DOCUMENTATION CODES:   Non-severe (moderate) malnutrition in context of chronic illness  INTERVENTION:   - Monitor magnesium, potassium, and phosphorus BID for at least 3 days, MD to replete as needed, as pt is at risk for refeeding syndrome given malnutrition. - Continue 100mg  thiamine x5 days due to risk of refeeding.   If cleared to advance tube feeding: -Advance Osmolite 1.5 to 30 ml/hr, advance by 10 ml every 12 hours to goal rate of 45 ml/hr.  If unable to advance: -Recommend increasing 60 ml Prosource TF 20 to TID to help meet protein needs in combination with Osmolite 1.5 @ 20 ml/hr.  -Boost Breeze po TID, each supplement provides 250 kcal and 9 grams of protein   NUTRITION DIAGNOSIS:   Moderate Malnutrition related to chronic illness as evidenced by moderate fat depletion, severe muscle depletion.  Ongoing.  GOAL:   Patient will meet greater than or equal to 90% of their needs  Not meeting currently.  MONITOR:   PO intake, Supplement acceptance, Labs, Weight trends, TF tolerance  ASSESSMENT:   67 y.o. F with cirrhosis, HTH, hx HE who presented with vomiting and decreased mentation, found to have empyema and sepsis.   11/14 Admit , s/p thoracentesis 1L yield 11/15 Left lateral chest tube placed 11/18 Left anterior chest tube placed, diet: dysphagia 3 11/19 small bore NGT placed 11/22 Left lateral chest tube removed  Patient in room, very slow to respond. Osmolite 1.5 running at 20 ml/hr. No other BMs documented for today. Spoke with RN, states she swallowed some water fine this morning. Was going to order her a house tray and see how she does. Will provide a Boost Breeze for her to try as well.  Messaged MD regarding tube feeding advancement and was advised to wait until CT was read. Will leave recommendations above if feeds can be advanced over the weekend.   Admission weight: 103 lbs Current weight: 108 lbs  Medications: Aldactone,  Thiamine  Labs reviewed: CBGs: 88-123 Low Na   Diet Order:   Diet Order             DIET DYS 3 Room service appropriate? Yes; Fluid consistency: Thin  Diet effective now                   EDUCATION NEEDS:   Not appropriate for education at this time  Skin:  Skin Assessment: Reviewed RN Assessment  Last BM:  11/21 -type 7  Height:   Ht Readings from Last 1 Encounters:  02/13/23 5\' 5"  (1.651 m)    Weight:   Wt Readings from Last 1 Encounters:  02/21/23 49.4 kg    BMI:  Body mass index is 18.12 kg/m.  Estimated Nutritional Needs:   Kcal:  1650-1800 kcals  Protein:  80-100 grams  Fluid:  >/= 1.7L   Tilda Franco, MS, RD, LDN Inpatient Clinical Dietitian Contact information available via Amion

## 2023-02-21 NOTE — Progress Notes (Signed)
HOSPITALIST ROUNDING NOTE Anita Price ION:[id number]  DOB: 1955/04/08  DOA: 02/13/2023  PCP: Jackie Plum, MD  02/21/2023,9:12 AM   LOS: 8 days      Code Status: DNR From: Home  current Dispo: Unclear     67 year old white female Chronic hepatitis C which has been untreated in the past--underlying cirrhosis, tobacco abuse Chronic pain syndrome due to chronic back pain--- underlying cervical radiculopathy status post C3-C6 ACDF Dr. Yevette Edwards--- follows with pain clinic in [place] is debating spinal cord stimulator trial Degenerative joint disease HTN Previous thyroid nodule  Presented to emergency room 02/13/2019 for generalized weakness anorexia emesis-she was confused disoriented to time and date Workup revealed large loculated left pleural effusion endometrial thickening splenic infarcts  11/14: Admitted on antibiotics, thoracentesis with purulent material 11/15: Pulmonology consulted, chest tube inserted 11/16: Breathing worse; Up to 35L HFNC; Lytics given 11/17: IR consulted for second chest tube 11/18: Second chest tube placed 11/19: Dobbhoff Cortrak placed--- palliative medicine consulted additionally for goals of care--remains full code   Significant studies: 11/14 CT head: NAD 11/14 CT chest: large left pleural effusion 11/15 Echo: Normal EF, normal valves 11/18 CT chest repeat moderate large volume left pleural effusion 20 cm greatest dimension, left base pigtail catheter drainage subscribe pragmatic right basilar nodular pulmonary opacities multifocal pneumonia 11/22 CT chest = substantial improvement left empyema lateral component completely evacuated residue 2 loculations 2.8 X3.9, 2.4 X3.1 11/22 CT abdomen pelvis cirrhosis liver portal hypertension gastrorenal shunt distal varices moderate ascites anasarca-probable splenic infarct?  Laceration seems stable from prior with no extravasation?  Subsegmental pulmonary embolus posterior basilar right lower lobe    Significant microbiology data: 11/14 Bcx x1: NGTD 11/14 pleural fluid: Strep intermedius   Procedures: 11/14: Thoracentesis 11/15: Chest tube insertion 11/16: Pleural lytics   Plan  Sepsis on admission secondary to strep intermedius empyema left-sided with large loculated effusion status post chest tube as above Thrombocytopenia Untreated hep C with cirrhosis Management as per pulmonary-output from tubes   Subsegmental PE? Seen on contrast abdomen pelvis CT scan, will repeat scan before relegating patient to systemic anticoagulation-appreciate critical care input  Abd pain Likely from ascites with some distention D/w Dr. [name] seems in keeping with fever related diarrhea-discontinue C. difficile precautions, will give Imodium, follow lactoferrin Will add Aldactone 50 daily, at home was not on meds for this indication at all  Acute metabolic encephalopathy secondary to sepsis Not felt to be secondary to hepatic encephalopathy-initially lactulose, Xifaxan but held because persistent diarrhea--- she is a most awake today that she has been Trial with core track --not eating --- strongly encouraged to eat-core track feeds causing diarrhea Follow Chem-12 INR a.m.  Severe protein energy malnutrition on dysphagia diet Increase tube feeds per protocol-continue Prosource 60 thiamine 100 daily Stop IV fluids today  Electrolyte abnormalities, metabolic acidosis--acidosis is improved Hyponatremia intermittent-periodic lab checks Hypokalemia replaced Albumin is very low corrected calcium probably within normal range  Impaired glucose tolerance likely secondary to feeds-no workup unless above 180  Chronic pain syndrome from ACDF follows with pain clinic Continues on  MS IR 7.5 every 4 as needed--- home dose 30 every 8, hold high doses for now Stopped--Lyrica 300 twice daily, Flexeril 10 at bedtime and MS Contin 15 every 12  Degenerative joint disease NOS  Endometrial  thickening Needs outpatient characterization not emergently  Thyroid nodule Needs outpatient characterization not emergently   I had a long discussion with the patient about trying to eat and improving her nutritional status-we also  spoke to the fact that if she was to decompensate it would not be a good idea to put her on machines as she would not survive this I then discussed this with the patient's daughter on the phone and she is now no CODE BLUE and DNR but we will continue to monitor and address current issues such as chest tube Appreciative of palliative care input  DVT prophylaxis: SCD  Status is: Inpatient Remains inpatient appropriate because:   Requires further improvement of p.o. intake, requires improvement of effusions and discussion about longer-term management    Subjective:  Most improved since have seen her interactive No chest pain I had a long discussion with the patient about trying to eat if she wants to improve    Objective + exam Vitals:   02/21/23 0300 02/21/23 0400 02/21/23 0759 02/21/23 0909  BP:  133/83 125/68   Pulse:  93 88   Resp:  (!) 27    Temp: 98.5 F (36.9 C)   97.8 F (36.6 C)  TempSrc: Oral   Axillary  SpO2:  100%    Weight: 49.4 kg     Height:       Filed Weights   02/19/23 0413 02/20/23 0500 02/21/23 0300  Weight: 50.4 kg 53 kg 49.4 kg    Examination:  EOMI NCAT no focal deficit Chest tubes in place on the left side --one removed S1-S2 normal-on monitors seems to be in sinus with some small runs of SVT and PVCs Abdomen no rebound, but tedner on deep palaption     Data Reviewed: reviewed   CBC    Component Value Date/Time   WBC 14.7 (H) 02/21/2023 0316   RBC 3.91 02/21/2023 0316   HGB 11.7 (L) 02/21/2023 0316   HCT 37.2 02/21/2023 0316   PLT 83 (L) 02/21/2023 0316   MCV 95.1 02/21/2023 0316   MCH 29.9 02/21/2023 0316   MCHC 31.5 02/21/2023 0316   RDW 16.2 (H) 02/21/2023 0316   LYMPHSABS 1.8 02/18/2023 0307    MONOABS 0.7 02/18/2023 0307   EOSABS 0.2 02/18/2023 0307   BASOSABS 0.1 02/18/2023 0307      Latest Ref Rng & Units 02/21/2023    3:16 AM 02/20/2023    2:54 AM 02/19/2023    3:19 AM  CMP  Glucose 70 - 99 mg/dL 161  096  045   BUN 8 - 23 mg/dL 15  15  14    Creatinine 0.44 - 1.00 mg/dL 4.09  8.11  9.14   Sodium 135 - 145 mmol/L 134  131  134   Potassium 3.5 - 5.1 mmol/L 4.0  3.7  4.0   Chloride 98 - 111 mmol/L 107  104  107   CO2 22 - 32 mmol/L 22  20  19    Calcium 8.9 - 10.3 mg/dL 7.2  7.1  7.0   Total Protein 6.5 - 8.1 g/dL 5.6  5.8    Total Bilirubin <1.2 mg/dL 1.0  0.8    Alkaline Phos 38 - 126 U/L 138  124    AST 15 - 41 U/L 103  69    ALT 0 - 44 U/L 22  17       Scheduled Meds:  carvedilol  3.125 mg Per NG tube BID WC   Chlorhexidine Gluconate Cloth  6 each Topical Daily   feeding supplement (PROSource TF20)  60 mL Per Tube Daily   mouth rinse  15 mL Mouth Rinse 4 times per day   pantoprazole (  PROTONIX) IV  40 mg Intravenous Q24H   thiamine  100 mg Per Tube Daily   Continuous Infusions:  sodium chloride 40 mL/hr at 02/21/23 0631    ceFAZolin (ANCEF) IV Stopped (02/21/23 1610)   feeding supplement (OSMOLITE 1.5 CAL) 20 mL/hr at 02/21/23 0631    Time  45  Rhetta Mura, MD  [facility]

## 2023-02-21 NOTE — Progress Notes (Signed)
NAME:  Anita Price, MRN:  [id number], DOB:  07-Feb-1956, LOS: 8 ADMISSION DATE:  02/13/2023, CONSULTATION DATE:  02/13/23 REFERRING MD:  Robb Matar - TRH, CHIEF COMPLAINT:  weakness6   History of Present Illness:  67 yo F PMH hep C, cirrhosis, HE, chronic pain, prolonged qtc who presented to [facility] ED 11/14 for 3-4d hx weakness + anorexia, n/v. In ED she was noted to be altered, unable to provide much reliable history. Had a CXR which showed L effusion. Underwent CT H c/a/p -- no intracranial abnormality, large L loculated pleural effusion, new thickening of endometrium, liver cirrhosis, splenomegaly, multiple splenic infarcts. Her labs revealed an AKI, meta cidosis, elevated LFTs, electrolyte disturbances, elevated ammonia, lactic acidosis, thrombocytopenia and coagulopathy. Mild leukocytosis. Started on vanc cefepime flagyl.  Underwent thora with IR, 1L milky purulent fluid off. Sent for cx. CXR post procedure with some residual effusion.  PCCM consulted in this setting, for pulm consult to see 11/15 AM.    In d/w daughter 11/15, this came out of nowhere a few days ago. Usually takes care of nephews and is functioning well w her chronic dz processes. Has had encephalopathy in the past r/t infections. Baseline opioid dependence -- follows w pain clinic in [place] & is considering a SCS trial   Pertinent  Medical History  Chronic pain DJD Prolonged qtc Protein calorie malnutrition Cirrhosis Hep C Hepatic encephalopathy   Significant Hospital Events: Including procedures, antibiotic start and stop dates in addition to other pertinent events   11/14 Admit to [facility]. IR thora.  11/15 pccm for chest tube, empyema LD> 10K, 71K TNC, 100 neutrophils and was turbid appearing   11/18 775 out from chest tube in the last 24hrs. S/p left apical chest tube placed by IR 11/19 Last 24 hours chest tube out from left lateral 840 cc and left apical 526 cc 11/20 24hr output 160 from lateral tube, 350 from anterior  tube.  11/21 Significant output from the more distal of the chest tubes over 1 liter per RN 11/22 out of apical chest tube   Interim History / Subjective:  No acute issues overnight   Objective   Blood pressure 125/68, pulse 88, temperature 98.5 F (36.9 C), temperature source Oral, resp. rate (!) 27, height 5\' 5"  (1.651 m), weight 49.4 kg, SpO2 100%.        Intake/Output Summary (Last 24 hours) at 02/21/2023 0825 Last data filed at 02/21/2023 0631 Gross per 24 hour  Intake 1793.31 ml  Output 3020 ml  Net -1226.69 ml   Filed Weights   02/19/23 0413 02/20/23 0500 02/21/23 0300  Weight: 50.4 kg 53 kg 49.4 kg   Physical Exam: General: Acute on chronic ill appearing deconditioned elderly female lying in bed, in NAD HEENT: [place]/AT, MM pink/moist, PERRL,  Neuro: Alert and oriented x1,  CV: s1s2 regular rate and rhythm, no murmur, rubs, or gallops,  PULM:  Clear to auscultation, no increased work of breathing, no added breath sounds  GI: soft, bowel sounds active in all 4 quadrants, non-tender, non-distended, tolerating TF Extremities: warm/dry, no edema  Skin: no rashes or lesions  Resolved Hospital Problem list     Assessment & Plan:   Strep intermedius empyema on left Acute encephalopathy 2/2 sepsis Chronic opiate dependent pain Dysphagia Frail Severe protein calorie malnutrition Hep C cirrhosis w/ splenic sequestration of platelets -Thora 11/14: 1 liter milky white output -Chest tube 11/15 > lytics 11/15 and 11/16 without much response.  -Additional apical chest tube  placed 11/18 P: Continue Cefazolin x4 weeks  Routine chest tube care  Monitor chest tube output  Repeat CT chest pending, maintain both chest tube until CT resulted   Best Practice (right click and "Reselect all SmartList Selections" daily)  Per primary   Critical care time: N/A  Beula Joyner D. Harris, NP-C [facility] Personal contact information can be found on Amion  If  no contact or response made please call [phone] 02/21/2023, 8:29 AM

## 2023-02-21 NOTE — Plan of Care (Signed)
  Problem: Fluid Volume: Goal: Hemodynamic stability will improve Outcome: Progressing   Problem: Clinical Measurements: Goal: Signs and symptoms of infection will decrease Outcome: Progressing   Problem: Respiratory: Goal: Ability to maintain adequate ventilation will improve Outcome: Progressing   Problem: Clinical Measurements: Goal: Ability to maintain clinical measurements within normal limits will improve Outcome: Progressing Goal: Diagnostic test results will improve Outcome: Progressing

## 2023-02-21 NOTE — Progress Notes (Signed)
Speech Language Pathology Treatment: Dysphagia  Patient Details Name: Anita Price MRN: [id number] DOB: 12-13-1955 Today's Date: 02/21/2023 Time: 4098-1191 SLP Time Calculation (min) (ACUTE ONLY): 11 min  Assessment / Plan / Recommendation Clinical Impression  Immediate cough x1/5 thin boluses via straw - appeared very lethargic and needed moderate verbal/visual cues to stop intake if coughing more than a few times with intake; Repositioned pt more upright for maximal safety; further intake without indication of aspiration.   Will continue efforts to follow up with her following imaging studies; pt would benefit from [facility] during this hospital coarse due to poor intake, subtle indications of dysphagia and respiratory compromise when/if MD approves; Did not observe pt with other po due to imaging with contrast planned.   Pt states she is not eating because she is not hungry and denies dysphagia, which is concerning for her prognosis.      HPI HPI: Pt is a 67 yo presenting 11/14 with AMS and several days of N/V. Admitted with sepsis, possible PNA. CT Head negative; CXR on admission showed near complete opacification of the L hemithorax. Previous swallow eval in April 2023 with swallowing impacted by mentation at that time, but she advanced to regular solids and thin liquids quickly. PMH includes: hepatic encephalopathy, metabolic encephalopathy, history open hepatitis C, liver cirrhosis, chronic back pain, DJD, chronic pain syndrome, prolonged QT interval, protein calorie malnutrition, thyroid nodule. Pt is to have a CT chest, abdomen and pelvis 02/21/2023 with contrast      SLP Plan  Continue with current plan of careContinue SLP       Recommendations for follow up therapy are one component of a multi-disciplinary discharge planning process, led by the attending physician.  Recommendations may be updated based on patient status, additional functional criteria and insurance authorization.     Recommendations  Diet recommendations: Dysphagia 3 (mechanical soft);Thin liquid Liquids provided via: Straw;Cup Medication Administration: Other (Comment) (as tolerated) Supervision: Patient able to self feed Compensations: Slow rate;Small sips/bites Postural Changes and/or Swallow Maneuvers: Seated upright 90 degrees;Upright 30-60 min after meal                        Dysphagia, unspecified (R13.10)     Continue with current plan of care    [name], MS [facility] SLP Acute Rehab Services Office [phone]  Chales Abrahams  02/21/2023, 10:18 AM

## 2023-02-21 NOTE — Progress Notes (Signed)
Daily Progress Note   Patient Name: Anita Price       Date: 02/21/2023 DOB: 1955/11/24  Age: 67 y.o. MRN#: [id number] Attending Physician: Rhetta Mura, MD Primary Care Physician: Jackie Plum, MD Admit Date: 02/13/2023 Length of Stay: 8 days  Reason for Consultation/Follow-up: Establishing goals of care  Subjective:   CC: Patient noting she is tired overall though was willing to talk with this provider today. Following up regarding complex medical decision making.   Subjective:  Reviewed EMR prior to presenting to bedside.  Discussed care with RN and PCCM provider for medical updates.  Presented to bedside to meet with patient.  No family present at bedside patient resting in bed though easily awakened.  Patient acknowledged provider and was willing to talk today.  Patient notes that she is feeling tired overall.  Patient much more interactive and alert today.  With permission, was able to discuss her medical illnesses and concerns about how ill she is.  Inquired if she had ever talked to her daughter, Anita Price, regarding her wishes for medical care if she was very ill.  Patient noted that she had not.  Again with permission inquired about CODE STATUS.  Explained concern that if patient was ill and if her heart were to stop or she were to stop breathing, interventions such as cardiac resuscitation and intubation with mechanical ventilation would not reverse her underlying medical conditions such as her cirrhosis.  Patient acknowledged this.  Patient stated that "everyone has a time" and she knows her support, eventually.  Patient stated that she would not want to be put on life support.  Noted she hopes she can talk to her daughter further about desire to be DNR.  Inquired if this provider could have permission to speak with daughter and she acknowledged that I could.  Spent time providing emotional support as able via active listening.  Patient voiced appreciation for the visit  today.  Noted palliative medicine team continue to follow along with patient's medical journey.  I attempted to call daughter multiple times after visit with patient though unfortunately unable to reach her.  Did update IDT including hospitalist who is planning to follow-up with patient and daughter regarding patient's wishes regarding CODE STATUS.  Updated patient had appropriately been updated to DNR/DNI after further discussions.  Objective:   Vital Signs:  BP 125/68   Pulse 88   Temp 98.5 F (36.9 C) (Oral)   Resp (!) 27   Ht 5\' 5"  (1.651 m)   Wt 49.4 kg   SpO2 100%   BMI 18.12 kg/m   Physical Exam: General: NAD, chronically ill appearing, cachectic, frail  HENT: poor dentition Cardiovascular: RRR Respiratory: not in respiratory distress, increased work of breathing noted  Neuro: Interactive, appropriately answering questions  Imaging: I personally reviewed recent imaging.   Assessment & Plan:   Assessment: Patient is a 67 year old female with a past medical history of cirrhosis in the setting of untreated hepatitis C, uncomplicated opioid dependence, protein calorie malnutrition, and hypertension who was admitted on 02/13/2023 for management of vomiting and decreased mentation.  During admission patient was found to have empyema and sepsis secondary to Streptococcus.  Patient has received management for empyema including antibiotics and chest tube placement.  Patient not a candidate for VATS.  Pulmonology following recommendations.  Patient also receiving management for AKI and encephalopathy.  Palliative medicine team consulted to assist with complex medical decision making.   Recommendations/Plan: # Complex medical decision making/goals of care:  -  Patient appropriately interactive today when seen.  Patient willing to engage in conversation regarding her medical illness.  Patient noted that "everyone has a time" and she knows hers will come.  Patient stated that she did not  want to be put on life support or receive cardiac resuscitation in the event that she is sick and if her heart stops or she stops breathing as she would want to be allowed to have a natural death.                -Attempted to call patient's daughter, Anita Price, multiple times though unable to reach today.  Will continue to engage in conversation as able.    Code Status: Limited: Do not attempt resuscitation (DNR) -DNR-LIMITED -Do Not Intubate/DNI    # Psycho-social/Spiritual Support:  - Support System: daughter; patient's son is deceased    # Discharge Planning:  To Be Determined  Discussed with: patient, hospitalist, RN  Thank you for allowing the palliative care team to participate in the care [name].  Alvester Morin, DO Palliative Care Provider PMT # [phone]  If patient remains symptomatic despite maximum doses, please call PMT at [phone] between 0700 and 1900. Outside of these hours, please call attending, as PMT does not have night coverage.  Personally spent 37 minutes in patient care including extensive chart review (labs, imaging, progress/consult notes, vital signs), medically appropraite exam, discussed with treatment team, education to patient, family, and staff, documenting clinical information, medication review and management, coordination of care, and available advanced directive documents.    *Please note that this is a verbal dictation therefore any spelling or grammatical errors are due to the "[employer]" system interpretation.

## 2023-02-22 ENCOUNTER — Inpatient Hospital Stay (HOSPITAL_COMMUNITY): Payer: Medicare Other

## 2023-02-22 DIAGNOSIS — J869 Pyothorax without fistula: Secondary | ICD-10-CM | POA: Diagnosis not present

## 2023-02-22 DIAGNOSIS — Z515 Encounter for palliative care: Secondary | ICD-10-CM | POA: Diagnosis not present

## 2023-02-22 DIAGNOSIS — R188 Other ascites: Secondary | ICD-10-CM

## 2023-02-22 DIAGNOSIS — Z7189 Other specified counseling: Secondary | ICD-10-CM | POA: Diagnosis not present

## 2023-02-22 DIAGNOSIS — G9340 Encephalopathy, unspecified: Secondary | ICD-10-CM | POA: Diagnosis not present

## 2023-02-22 DIAGNOSIS — A419 Sepsis, unspecified organism: Secondary | ICD-10-CM | POA: Diagnosis not present

## 2023-02-22 DIAGNOSIS — E440 Moderate protein-calorie malnutrition: Secondary | ICD-10-CM | POA: Diagnosis not present

## 2023-02-22 LAB — CBC
HCT: 30.3 % — ABNORMAL LOW (ref 36.0–46.0)
Hemoglobin: 10.3 g/dL — ABNORMAL LOW (ref 12.0–15.0)
MCH: 30.9 pg (ref 26.0–34.0)
MCHC: 34 g/dL (ref 30.0–36.0)
MCV: 91 fL (ref 80.0–100.0)
Platelets: 83 10*3/uL — ABNORMAL LOW (ref 150–400)
RBC: 3.33 MIL/uL — ABNORMAL LOW (ref 3.87–5.11)
RDW: 16.2 % — ABNORMAL HIGH (ref 11.5–15.5)
WBC: 9.9 10*3/uL (ref 4.0–10.5)
nRBC: 0 % (ref 0.0–0.2)

## 2023-02-22 LAB — PROTIME-INR
INR: 1.4 — ABNORMAL HIGH (ref 0.8–1.2)
Prothrombin Time: 17 s — ABNORMAL HIGH (ref 11.4–15.2)

## 2023-02-22 LAB — GLUCOSE, CAPILLARY
Glucose-Capillary: 100 mg/dL — ABNORMAL HIGH (ref 70–99)
Glucose-Capillary: 120 mg/dL — ABNORMAL HIGH (ref 70–99)
Glucose-Capillary: 122 mg/dL — ABNORMAL HIGH (ref 70–99)
Glucose-Capillary: 136 mg/dL — ABNORMAL HIGH (ref 70–99)
Glucose-Capillary: 79 mg/dL (ref 70–99)
Glucose-Capillary: 95 mg/dL (ref 70–99)
Glucose-Capillary: 99 mg/dL (ref 70–99)

## 2023-02-22 LAB — COMPREHENSIVE METABOLIC PANEL
ALT: 20 U/L (ref 0–44)
AST: 78 U/L — ABNORMAL HIGH (ref 15–41)
Albumin: 1.6 g/dL — ABNORMAL LOW (ref 3.5–5.0)
Alkaline Phosphatase: 129 U/L — ABNORMAL HIGH (ref 38–126)
Anion gap: 5 (ref 5–15)
BUN: 17 mg/dL (ref 8–23)
CO2: 21 mmol/L — ABNORMAL LOW (ref 22–32)
Calcium: 7 mg/dL — ABNORMAL LOW (ref 8.9–10.3)
Chloride: 104 mmol/L (ref 98–111)
Creatinine, Ser: 0.45 mg/dL (ref 0.44–1.00)
GFR, Estimated: >60 mL/min (ref >60)
Glucose, Bld: 110 mg/dL — ABNORMAL HIGH (ref 70–99)
Potassium: 3.7 mmol/L (ref 3.5–5.1)
Sodium: 130 mmol/L — ABNORMAL LOW (ref 135–145)
Total Bilirubin: 0.7 mg/dL (ref <1.2)
Total Protein: 5.4 g/dL — ABNORMAL LOW (ref 6.5–8.1)

## 2023-02-22 LAB — AEROBIC/ANAEROBIC CULTURE W GRAM STAIN (SURGICAL/DEEP WOUND)

## 2023-02-22 LAB — LACTATE DEHYDROGENASE, PLEURAL OR PERITONEAL FLUID: LD, Fluid: 34 U/L — ABNORMAL HIGH (ref 3–23)

## 2023-02-22 LAB — BODY FLUID CELL COUNT WITH DIFFERENTIAL
Eos, Fluid: 2 %
Lymphs, Fluid: 12 %
Monocyte-Macrophage-Serous Fluid: 15 % — ABNORMAL LOW (ref 50–90)
Neutrophil Count, Fluid: 71 % — ABNORMAL HIGH (ref 0–25)
Total Nucleated Cell Count, Fluid: 82 uL (ref 0–1000)

## 2023-02-22 LAB — ALBUMIN, PLEURAL OR PERITONEAL FLUID: Albumin, Fluid: <1.5 g/dL

## 2023-02-22 LAB — HEPARIN LEVEL (UNFRACTIONATED)
Heparin Unfractionated: 0.19 [IU]/mL — ABNORMAL LOW (ref 0.30–0.70)
Heparin Unfractionated: 0.38 [IU]/mL (ref 0.30–0.70)

## 2023-02-22 MED ORDER — LIDOCAINE HCL 1 % IJ SOLN
INTRAMUSCULAR | Status: AC
  Filled 2023-02-22: qty 20

## 2023-02-22 MED ORDER — LIDOCAINE HCL 1 % IJ SOLN
INTRAMUSCULAR | Status: AC
Start: 2023-02-22 — End: ?
  Filled 2023-02-22: qty 20

## 2023-02-22 MED ORDER — HEPARIN BOLUS VIA INFUSION
1000.0000 [IU] | Freq: Once | INTRAVENOUS | Status: AC
  Administered 2023-02-22: 1000 [IU] via INTRAVENOUS
  Filled 2023-02-22: qty 1000

## 2023-02-22 MED ORDER — LOPERAMIDE HCL 2 MG PO CAPS
4.0000 mg | ORAL_CAPSULE | Freq: Four times a day (QID) | ORAL | Status: DC
  Administered 2023-02-22 – 2023-02-28 (×22): 4 mg via ORAL
  Filled 2023-02-22 (×30): qty 2

## 2023-02-22 NOTE — Progress Notes (Signed)
USGPIV placed to RUA. LUA now infiltrating. No further veins found suitable for USGPIV. If further access is required, consider central line.

## 2023-02-22 NOTE — Procedures (Signed)
Ultrasound-guided diagnostic and therapeutic paracentesis performed yielding 450 cc of light yellow fluid. No immediate complications. The fluid was sent to the lab for preordered studies. EBL none. Due to close proximity of bowel loops and somewhat loculated nature of ascites only the above amount could be removed today.

## 2023-02-22 NOTE — Progress Notes (Signed)
Nutrition Brief Note  Calorie count ordered by MD in afternoon. Pt is being followed by dietitians. See RD note from 02/21/23 for full follow-up assessment. RD updated calorie count order with instructions. Reached out to RN via secure chat to request RN to hang envelope to get calorie count started today.  Instructions: Please hang calorie count envelope on the patient's door. Document percent consumed for each item on the patient's meal tray ticket and keep in envelope. Also document percent of any supplement or snack pt consumes and keep documentation in envelope for RD to review.   RD will follow-up on results of calorie count after the weekend.  Letta Median, MS, RD, LDN, CNSC Pager number available on Amion

## 2023-02-22 NOTE — Progress Notes (Signed)
PHARMACY - ANTICOAGULATION CONSULT NOTE  Pharmacy Consult for IV heparin Indication: pulmonary embolus  Allergies  Allergen Reactions   Celecoxib Other (See Comments)    Insomnia   Duloxetine Hcl Other (See Comments)    Insomnia   Tizanidine Nausea And Vomiting   Venlafaxine Other (See Comments)    Hallucinations   Effersyllium [Psyllium] Other (See Comments)    Made the patient "feel badly"   Escitalopram Other (See Comments)    insomnia   Neurontin [Gabapentin] Other (See Comments)    Made the patient "feel badly"   Tramadol Other (See Comments)    Made the patient "feel badly"    Patient Measurements: Height: 5\' 5"  (165.1 cm) Weight: 53.6 kg (118 lb 2.7 oz) IBW/kg (Calculated) : 57 Heparin Dosing Weight: 49.4 kg   Vital Signs: Temp: 97.3 F (36.3 C) (11/23 0836) Temp Source: Axillary (11/23 0836) BP: 124/58 (11/23 0743) Pulse Rate: 81 (11/23 0743)  Labs: Recent Labs    02/20/23 0254 02/21/23 0316 02/22/23 0926 02/22/23 0927  HGB 12.2 11.7*  --  10.3*  HCT 37.0 37.2  --  30.3*  PLT 77* 83*  --  83*  LABPROT 19.0* 16.3* 17.0*  --   INR 1.6* 1.3* 1.4*  --   HEPARINUNFRC  --   --   --  0.19*  CREATININE 0.40* 0.43*  --  0.45    Estimated Creatinine Clearance: 58.5 mL/min (by C-G formula based on SCr of 0.45 mg/dL).   Medical History: Past Medical History:  Diagnosis Date   Acute metabolic encephalopathy    Chronic back pain    DJD (degenerative joint disease)    Essential hypertension 07/10/2020   Gallbladder sludge    Hepatitis C 07/10/2020   Liver cirrhosis (HCC)    Pain management    Prolonged QT interval    Protein calorie malnutrition (HCC)    Thyroid nodule    Tobacco use 07/10/2020    Medications:  Medications Prior to Admission  Medication Sig Dispense Refill Last Dose   morphine (MS CONTIN) 30 MG 12 hr tablet Take 30 mg by mouth every 8 (eight) hours.   02/12/2023 at 2100   pregabalin (LYRICA) 300 MG capsule Take 300 mg by mouth in the  morning and at bedtime.   02/12/2023 at pm   carvedilol (COREG) 3.125 MG tablet Take 1 tablet (3.125 mg total) by mouth 2 (two) times daily with a meal. (Patient not taking: Reported on 02/13/2023) 60 tablet 0 Not Taking   cyclobenzaprine (FLEXERIL) 10 MG tablet Take 1 tablet (10 mg total) by mouth at bedtime as needed for muscle spasms. (Patient not taking: Reported on 02/13/2023) 30 tablet 0 Not Taking   morphine (MS CONTIN) 15 MG 12 hr tablet Take 1 tablet (15 mg total) by mouth every 12 (twelve) hours. (Patient not taking: Reported on 02/13/2023)   Not Taking    Assessment: Pharmacy is consulted to start heparin drip on 67 yo female diagnosed with PE. CT of chest today indicates right lower lobe segmental and subsegmental pulmonary emboli. No evidence of right heart strain. Pt not currently on anticoagulation.   Today, 02/22/23 Hgb 10.3--stable, plt 83--low, stable INR elevated at 1.4 likely due to cirrhosis  Hx of cirrhosis    Goal of Therapy:  Heparin level 0.3-0.7 units/ml Monitor platelets by anticoagulation protocol: Yes   Plan:  Heparin 1000 unit bolus x1 Increase heparin gtt rate to 1000 units/hr  Obtain heparin level 8 hours after rate change Daily CBC,  HL  Monitor for signs and symptoms of bleeding    Cherylin Mylar, PharmD Clinical Pharmacist  11/23/202411:05 AM

## 2023-02-22 NOTE — Progress Notes (Addendum)
HOSPITALIST ROUNDING NOTE Anita Price:[id number]  DOB: 02/12/1956  DOA: 02/13/2023  PCP: Jackie Plum, MD  02/22/2023,10:46 AM   LOS: 9 days      Code Status: DNR From: Home  current Dispo: Unclear     67 year old white female Chronic hepatitis C which has been untreated in the past--underlying cirrhosis, tobacco abuse Chronic pain syndrome due to chronic back pain--- underlying cervical radiculopathy status post C3-C6 ACDF Dr. Yevette Edwards--- follows with pain clinic in [name] is debating spinal cord stimulator trial Degenerative joint disease HTN Previous thyroid nodule  Presented to emergency room 02/13/2019 for generalized weakness anorexia emesis-she was confused disoriented to time and date Workup revealed large loculated left pleural effusion endometrial thickening splenic infarcts  11/14: Admitted on antibiotics, thoracentesis with purulent material 11/15: Pulmonology consulted, chest tube inserted 11/16: Breathing worse; Up to 35L HFNC; Lytics given 11/17: IR consulted for second chest tube 11/18: Second chest tube placed 11/19: Dobbhoff Cortrak placed--- palliative medicine consulted additionally for goals of care--remains full code   Significant studies: 11/14 CT head: NAD 11/14 CT chest: large left pleural effusion 11/15 Echo: Normal EF, normal valves 11/18 CT chest repeat moderate large volume left pleural effusion 20 cm greatest dimension, left base pigtail catheter drainage subscribe pragmatic right basilar nodular pulmonary opacities multifocal pneumonia 11/22 CT chest = substantial improvement left empyema lateral component completely evacuated residue 2 loculations 2.8 X3.9, 2.4 X3.1 11/22 CT abdomen pelvis cirrhosis liver portal hypertension gastrorenal shunt distal varices moderate ascites anasarca-probable splenic infarct?  Laceration seems stable from prior with no extravasation?  Subsegmental pulmonary embolus posterior basilar right lower lobe 11/20 repeat  CT chest with contrast shows right lower lobe segmental and subsegmental emboli no heart strain loculated effusion left with pleural thickening left lower lobe atelectasis small right pleural effusion with patchy airspace disease and cirrhosis?  Splenic infarct again seen   Significant microbiology data: 11/14 Bcx x1: NGTD 11/14 pleural fluid: Strep intermedius   Procedures: 11/14: Thoracentesis 11/15: Chest tube insertion 11/16: Pleural lytics 11/22: Lower right-sided chest tube removed-currently leaking however about 400 cc   Plan  Strep intermedius sepsis with empyema  large loculated effusion status post chest tube as above Thrombocytopenia Untreated hep C with cirrhosis Continues on Ancef monotherapy at this time--blood cultures on admission NGTD Pleural fluid cultures show strep intermedius Management as per pulmonary-output from tubes is diminished however patient is having leakage from the tube replacement site on the left lower lobe which may need further intervention  Right sided PE Subsegmental PE additionally on CT scan Heparin GTT started 11/22-Will need probable 6 to 8 months of treatment Echocardiogram done recently therefore as patient is hemodynamically stable would hold repeat  Abd pain, splenic infarct ascites + distention D/w Dr. [name] seems in keeping with fever related diarrhea-discontinue C. difficile precautions, will give Imodium Given patient is still tender will obtain paracentesis (despite the fact that patient is on antibiotics)  Acute metabolic encephalopathy secondary to sepsis---has imrpioved well over 11/22 Unlikely hepatic encephalopathy-initially lactulose, Xifaxan but held 2/2 diarrhea--- Trial with core track --not eating --- strongly encouraged to eat-core track feeds causing diarrhea--calorie count Start Imodium scheduled 4mg  qid and observe if slows  Severe protein energy malnutrition on dysphagia diet Increase tube feeds per  protocol-continue Prosource 60 thiamine 100 daily Stop IV fluids 11/22  Electrolyte abnormalities, metabolic acidosis--acidosis is improved Hyponatremia intermittent-periodic lab checks Hypokalemia replaced Albumin is very low corrected calcium normal  Impaired glucose tolerance likely secondary to feeds-no workup unless  above 180  Chronic pain syndrome from ACDF follows with pain clinic Continues on  MS IR 7.5 every 4 as needed--- home dose 30 every 8, hold high doses for now Stopped--Lyrica 300 twice daily, Flexeril 10 at bedtime and MS Contin 15 every 12  Degenerative joint disease NOS  Endometrial thickening Needs outpatient characterization not emergently  Thyroid nodule Needs outpatient characterization not emergently   I had a long discussion with the patient about trying to eat and improving her nutritional status- I have discussed extensively with the patient's daughter who is aware of her overall poor prognosis and the patient is now DNR We are hopeful for some form of recovery in the next several days so that we can liberate her from the core track  DVT prophylaxis: SCD  Status is: Inpatient Remains inpatient appropriate because:   Requires further improvement of p.o. intake, requires improvement of effusions and discussion about longer-term management    Subjective:  Awake coherent does not appear to be in distress-most coherent have seen her over the past several days-ate some breakfast this morning-not really eating much  more interactive She is having drainage from left lower chest into a bag that has been changed by nursing The Pleurx catheter/catheter in the upper left chest is not draining   Objective + exam Vitals:   02/22/23 0600 02/22/23 0700 02/22/23 0743 02/22/23 0836  BP: (!) 124/58  (!) 124/58   Pulse: 73 61 81   Resp: (!) 24 19    Temp:    (!) 97.3 F (36.3 C)  TempSrc:    Axillary  SpO2: 98% 97%    Weight:      Height:       Filed  Weights   02/20/23 0500 02/21/23 0300 02/22/23 0500  Weight: 53 kg 49.4 kg 53.6 kg    Examination:  EOMI NCAT no focal deficit Chest tubes in place on the left side --one removed from bag in place left lower lobe S1-S2 normal-on monitors seems to be in sinus with some small runs of SVT and PVCs Abdomen no rebound, but tedner on deep palaption Neuro is intact no focal deficit    Data Reviewed: reviewed   CBC    Component Value Date/Time   WBC 9.9 02/22/2023 0927   RBC 3.33 (L) 02/22/2023 0927   HGB 10.3 (L) 02/22/2023 0927   HCT 30.3 (L) 02/22/2023 0927   PLT 83 (L) 02/22/2023 0927   MCV 91.0 02/22/2023 0927   MCH 30.9 02/22/2023 0927   MCHC 34.0 02/22/2023 0927   RDW 16.2 (H) 02/22/2023 0927   LYMPHSABS 1.8 02/18/2023 0307   MONOABS 0.7 02/18/2023 0307   EOSABS 0.2 02/18/2023 0307   BASOSABS 0.1 02/18/2023 0307      Latest Ref Rng & Units 02/22/2023    9:27 AM 02/21/2023    3:16 AM 02/20/2023    2:54 AM  CMP  Glucose 70 - 99 mg/dL 440  102  725   BUN 8 - 23 mg/dL 17  15  15    Creatinine 0.44 - 1.00 mg/dL 3.66  4.40  3.47   Sodium 135 - 145 mmol/L 130  134  131   Potassium 3.5 - 5.1 mmol/L 3.7  4.0  3.7   Chloride 98 - 111 mmol/L 104  107  104   CO2 22 - 32 mmol/L 21  22  20    Calcium 8.9 - 10.3 mg/dL 7.0  7.2  7.1   Total Protein 6.5 - 8.1  g/dL 5.4  5.6  5.8   Total Bilirubin <1.2 mg/dL 0.7  1.0  0.8   Alkaline Phos 38 - 126 U/L 129  138  124   AST 15 - 41 U/L 78  103  69   ALT 0 - 44 U/L 20  22  17       Scheduled Meds:  carvedilol  3.125 mg Per NG tube BID WC   Chlorhexidine Gluconate Cloth  6 each Topical Daily   feeding supplement  1 Container Oral TID BM   feeding supplement (PROSource TF20)  60 mL Per Tube Daily   loperamide  2 mg Oral TID   mouth rinse  15 mL Mouth Rinse 4 times per day   pantoprazole (PROTONIX) IV  40 mg Intravenous Q24H   sodium chloride flush  10 mL Intrapleural Q8H   spironolactone  50 mg Oral Daily   thiamine  100 mg Per  Tube Daily   Continuous Infusions:   ceFAZolin (ANCEF) IV Stopped (02/22/23 0552)   feeding supplement (OSMOLITE 1.5 CAL) 20 mL/hr at 02/22/23 0700   heparin 850 Units/hr (02/22/23 0700)    Time  55  Rhetta Mura, MD  [facility]

## 2023-02-22 NOTE — Progress Notes (Addendum)
NAME:  Anita Price, MRN:  [id number], DOB:  23-Jul-1955, LOS: 9 ADMISSION DATE:  02/13/2023, CONSULTATION DATE:  02/13/23 REFERRING MD:  Robb Matar - TR[facility], CHIEF COMPLAINT:  weakness6   History of Present Illness:  67 yo F PMH hep C, cirrhosis, HE, chronic pain, prolonged qtc who presented to [facility] ED 11/14 for 3-4d hx weakness + anorexia, n/v. In ED she was noted to be altered, unable to provide much reliable history. Had a CXR which showed L effusion. Underwent CT H c/a/p -- no intracranial abnormality, large L loculated pleural effusion, new thickening of endometrium, liver cirrhosis, splenomegaly, multiple splenic infarcts. Her labs revealed an AKI, meta cidosis, elevated LFTs, electrolyte disturbances, elevated ammonia, lactic acidosis, thrombocytopenia and coagulopathy. Mild leukocytosis. Started on vanc cefepime flagyl.  Underwent thora with IR, 1L milky purulent fluid off. Sent for cx. CXR post procedure with some residual effusion.  PCCM consulted in this setting, for pulm consult to see 11/15 AM.    In d/w daughter 11/15, this came out of nowhere a few days ago. Usually takes care of nephews and is functioning well w her chronic dz processes. Has had encephalopathy in the past r/t infections. Baseline opioid dependence -- follows w pain clinic in [place] & is considering a SCS trial   Pertinent  Medical History  Chronic pain DJD Prolonged qtc Protein calorie malnutrition Cirrhosis Hep C Hepatic encephalopathy   Significant Hospital Events: Including procedures, antibiotic start and stop dates in addition to other pertinent events   11/14 Admit to [facility]. IR thora.  11/15 pccm for chest tube, empyema LD> 10K, 71K TNC, 100 neutrophils and was turbid appearing   11/18 775 out from chest tube in the last 24hrs. S/p left apical chest tube placed by IR 11/19 Last 24 hours chest tube out from left lateral 840 cc and left apical 526 cc 11/20 24hr output 160 from lateral tube, 350 from anterior  tube.  11/21 Significant output from the more distal of the chest tubes over 1 liter per RN 11/22 out of apical chest tube. Left lower tube removed with possible subdiaphragmatic placement  Interim History / Subjective:  No complaints Left apical tube with 170 cc output Yesterday left lower tube removed with possible subdiaphragmatic placement given high volume ouput. Chest tube entry site suture with some drainage ranging from 200-400cc. CT A/P and CTA confirmed RLL segmental and subsegmental PE. Started on heparin gtt  Objective   Blood pressure (!) 124/58, pulse 81, temperature (!) 97.3 F (36.3 C), temperature source Axillary, resp. rate 19, height 5\' 5"  (1.651 m), weight 53.6 kg, SpO2 97%.        Intake/Output Summary (Last 24 hours) at 02/22/2023 1049 Last data filed at 02/22/2023 1043 Gross per 24 hour  Intake 1071.46 ml  Output 2570 ml  Net -1498.54 ml   Filed Weights   02/20/23 0500 02/21/23 0300 02/22/23 0500  Weight: 53 kg 49.4 kg 53.6 kg   Physical Exam: General: Chronically ill-appearing, no acute distress HENT: [place], AT, OP clear, MMM Eyes: EOMI, no scleral icterus Respiratory: Diminished but clear to auscultation bilaterally.  No crackles, wheezing or rales Cardiovascular: RRR, -M/R/G, no JVD GI: Mildly distended with fluid wave, BS+, soft, nontender Extremities:-Edema,-tenderness Neuro: AAO x2, CNII-XII grossly intact  Imaging, labs and test in EMR in the last 24 hours reviewed independently by me. Pertinent findings below: CTA 11/22 with RLL segmental and subsegmental PE, loculated pleural effusion, cirrhosis, ascites  Resolved Hospital Problem list  Assessment & Plan:   Strep intermedius empyema on left Acute encephalopathy 2/2 sepsis Chronic opiate dependent pain Dysphagia Frail Severe protein calorie malnutrition Hep C cirrhosis w/ splenic sequestration of platelets with ascites Newly diagnosed RLL pulmonary emboli -Thora 11/14: 1 liter  milky white output -Left lower chest tube 11/15 > lytics 11/15 and 11/16 without much response.  -Additional apical chest tube placed 11/18 -Left lower chest tube removed 11/22 P: Continue Cefazolin. Recommend total 4 weeks treatment Routine chest tube care Monitor chest tube output  Recommend paracentesis as lower chest tube placement may have been partially subdiaphragmatic and having leakage due to moderate ascites. Of note, pleural effusion did improve with placement of both chest tubes so do believe this was in pleural space initially and may have migrated. Discussed with radiology.  Best Practice (right click and "Reselect all SmartList Selections" daily)  Per primary   Critical care time: N/A   Discussed plan with primary team Care Time: 35 min  Mechele Collin, M.D. [facility] Pulmonary/Critical Care Medicine 02/22/2023 10:49 AM   See Amion for personal pager For hours between 7 PM to 7 AM, please call Elink for urgent questions

## 2023-02-22 NOTE — Progress Notes (Signed)
Daily Progress Note   Patient Name: Anita Price       Date: 02/22/2023 DOB: 18-Sep-1955  Age: 67 y.o. MRN#: [id number] Attending Physician: Rhetta Mura, MD Primary Care Physician: Jackie Plum, MD Admit Date: 02/13/2023 Length of Stay: 9 days  Reason for Consultation/Follow-up: Establishing goals of care  Subjective:   CC: Patient sipping on shake today. Following up regarding complex medical decision making.   Subjective:  Reviewed EMR prior to presenting to bedside.  Discussed care with bedside RN for updates.  Presented to bedside to meet with patient.  No family present at bedside.  Patient continues to be more interactive every day.  Patient seen sipping on shake today.  Inquired how patient was feeling at this time and she noted she was "okay".  Again spent time trying to review her overall medical goals.  Patient can state that her primary goal is to "get home".  Spent trying to explore what that time at home would look like.  With patient's permission discussed could likely get her home for quality focus time with hospice if that was her primary goal.  Discussed that if her goal is to prolong time with medical interventions, would need to work to get stronger before can get home.  Patient acknowledged this and noted she wanted some time to think about it.  Did states she is trying to "eat more" though it is still very minimal.  Spent time providing emotional support via active listening and answering questions as able.  Noted palliative medicine team will continue to follow with patient's medical journey.  Tried to call patient's daughter, Anita Price, again in the afternoon without answer.  Have been unable to reach patient's daughter yesterday or today.  Did inform hospitalist and RN of this.  Objective:   Vital Signs:  BP (!) 124/58   Pulse 81   Temp (!) 97.3 F (36.3 C) (Axillary)   Resp 19   Ht 5\' 5"  (1.651 m)   Wt 53.6 kg   SpO2 97%   BMI 19.66 kg/m    Physical Exam: General: NAD, chronically ill appearing, cachectic, frail  HENT: poor dentition Cardiovascular: RRR Respiratory: not in respiratory distress, increased work of breathing noted  Neuro: Interactive, appropriately answering questions  Imaging: I personally reviewed recent imaging.   Assessment & Plan:   Assessment: Patient is a 67 year old female with a past medical history of cirrhosis in the setting of untreated hepatitis C, uncomplicated opioid dependence, protein calorie malnutrition, and hypertension who was admitted on 02/13/2023 for management of vomiting and decreased mentation.  During admission patient was found to have empyema and sepsis secondary to Streptococcus.  Patient has received management for empyema including antibiotics and chest tube placement.  Patient not a candidate for VATS.  Pulmonology following recommendations.  Patient also receiving management for AKI and encephalopathy.  Palliative medicine team consulted to assist with complex medical decision making.   Recommendations/Plan: # Complex medical decision making/goals of care:  -Patient continues to be more interactive every day and appropriately answering questions as detailed above in HPI.  Patient notes her primary goal at this time is to "go home".  Discussed pathways for medical care to obtain this would include aggressive medical interventions to prolong the life versus focusing on quality time and getting home with hospice.  Patient wanted time to consider these options to determine what her ultimate overall goals are.  Encouraged consideration of this.                -  Attempted to call patient's daughter, Anita Price, multiple times though unable to reach again today.  Will continue to engage in conversation as able.    Code Status: Limited: Do not attempt resuscitation (DNR) -DNR-LIMITED -Do Not Intubate/DNI    # Psycho-social/Spiritual Support:  - Support System: daughter; patient's son is  deceased    # Discharge Planning:  To Be Determined  Discussed with: patient, hospitalist, RN  Thank you for allowing the palliative care team to participate in the care Anita Price.  Alvester Morin, DO Palliative Care Provider PMT # [phone]  If patient remains symptomatic despite maximum doses, please call PMT at [phone] between 0700 and 1900. Outside of these hours, please call attending, as PMT does not have night coverage.  Personally spent 35 minutes in patient care including extensive chart review (labs, imaging, progress/consult notes, vital signs), medically appropraite exam, discussed with treatment team, education to patient, family, and staff, documenting clinical information, medication review and management, coordination of care, and available advanced directive documents.    *Please note that this is a verbal dictation therefore any spelling or grammatical errors are due to the "[employer]agon [employer] One" system interpretation.

## 2023-02-22 NOTE — TOC Progression Note (Signed)
Transition of Care [facility]) - Progression Note    Patient Details  Name: ALEXZANDREA MANS MRN: [id number] Date of Birth: 01/08/1956  Transition of Care [facility] CM/SW Contact  Darleene Cleaver, [place] Phone Number: 02/22/2023, 5:47 PM  Clinical Narrative:     TOC continuing to follow patient's progress throughout discharge planning.  Palliative following patient, at this time discharge plan is unknown.   Expected Discharge Plan: Home/Self Care Barriers to Discharge: Continued Medical Work up  Expected Discharge Plan and Services In-house Referral: Clinical Social Work   Post Acute Care Choice: Home Health Living arrangements for the past 2 months: Apartment                                       Social Determinants of Health (SDOH) Interventions SDOH Screenings   Food Insecurity: No Food Insecurity (02/14/2023)  Housing: Low Risk  (02/14/2023)  Transportation Needs: No Transportation Needs (02/14/2023)  Utilities: Not At Risk (02/14/2023)  Social Connections: Unknown (08/01/2021)   Received from [facility]  Tobacco Use: High Risk (02/17/2023)    Readmission Risk Interventions    02/16/2023    3:49 PM 07/13/2020   11:39 AM  Readmission Risk Prevention Plan  Post Dischage Appt  Not Complete  Appt Comments  attempted to call PCP to schedule f/u- however office informed us that patient would need to call for appointment  Medication Screening  Complete  Transportation Screening Not Complete Complete  Transportation Screening Comment Patient only alert and orieted x1, unable to complete transportation screening.   PCP or Specialist Appt within 5-7 Days Not Complete   Not Complete comments Patient only alert and oriented x1 can't schedule an appt.   Home Care Screening Not Complete   Home Care Screening Not Completed Comments Alert and oriented x1 and not medically ready for HH.   Medication Review (RN CM) Referral to Pharmacy

## 2023-02-22 NOTE — Progress Notes (Signed)
PHARMACY - ANTICOAGULATION CONSULT NOTE  Pharmacy Consult for IV heparin Indication: pulmonary embolus  Allergies  Allergen Reactions   Celecoxib Other (See Comments)    Insomnia   Duloxetine Hcl Other (See Comments)    Insomnia   Tizanidine Nausea And Vomiting   Venlafaxine Other (See Comments)    Hallucinations   Effersyllium [Psyllium] Other (See Comments)    Made the patient "feel badly"   Escitalopram Other (See Comments)    insomnia   Neurontin [Gabapentin] Other (See Comments)    Made the patient "feel badly"   Tramadol Other (See Comments)    Made the patient "feel badly"    Patient Measurements: Height: 5\' 5"  (165.1 cm) Weight: 53.6 kg (118 lb 2.7 oz) IBW/kg (Calculated) : 57 Heparin Dosing Weight: 49.4 kg   Vital Signs: Temp: 98.1 F (36.7 C) (11/23 1730) Temp Source: Oral (11/23 1730) BP: 125/63 (11/23 1714) Pulse Rate: 95 (11/23 1714)  Labs: Recent Labs    02/20/23 0254 02/21/23 0316 02/22/23 0926 02/22/23 0927 02/22/23 1903  HGB 12.2 11.7*  --  10.3*  --   HCT 37.0 37.2  --  30.3*  --   PLT 77* 83*  --  83*  --   LABPROT 19.0* 16.3* 17.0*  --   --   INR 1.6* 1.3* 1.4*  --   --   HEPARINUNFRC  --   --   --  0.19* 0.38  CREATININE 0.40* 0.43*  --  0.45  --     Estimated Creatinine Clearance: 58.5 mL/min (by C-G formula based on SCr of 0.45 mg/dL).   Medical History: Past Medical History:  Diagnosis Date   Acute metabolic encephalopathy    Chronic back pain    DJD (degenerative joint disease)    Essential hypertension 07/10/2020   Gallbladder sludge    Hepatitis C 07/10/2020   Liver cirrhosis (HCC)    Pain management    Prolonged QT interval    Protein calorie malnutrition (HCC)    Thyroid nodule    Tobacco use 07/10/2020    Medications:  Medications Prior to Admission  Medication Sig Dispense Refill Last Dose   morphine (MS CONTIN) 30 MG 12 hr tablet Take 30 mg by mouth every 8 (eight) hours.   02/12/2023 at 2100   pregabalin  (LYRICA) 300 MG capsule Take 300 mg by mouth in the morning and at bedtime.   02/12/2023 at pm   carvedilol (COREG) 3.125 MG tablet Take 1 tablet (3.125 mg total) by mouth 2 (two) times daily with a meal. (Patient not taking: Reported on 02/13/2023) 60 tablet 0 Not Taking   cyclobenzaprine (FLEXERIL) 10 MG tablet Take 1 tablet (10 mg total) by mouth at bedtime as needed for muscle spasms. (Patient not taking: Reported on 02/13/2023) 30 tablet 0 Not Taking   morphine (MS CONTIN) 15 MG 12 hr tablet Take 1 tablet (15 mg total) by mouth every 12 (twelve) hours. (Patient not taking: Reported on 02/13/2023)   Not Taking    Assessment: Pharmacy is consulted to start heparin drip on 67 yo female diagnosed with PE. CT of chest today indicates right lower lobe segmental and subsegmental pulmonary emboli. No evidence of right heart strain. Pt not currently on anticoagulation.   Today, 02/22/23 Heparin level 0.38, therapeutic after heparin rate increased to 1000 units/hr Hgb 10.3--stable, plt 83--low, stable INR elevated at 1.4 likely due to cirrhosis  Paracentesis performed today No complications of therapy noted   Goal of Therapy:  Heparin  level 0.3-0.7 units/ml Monitor platelets by anticoagulation protocol: Yes   Plan:  Continue heparin infusion at 1000 units/hr  Daily CBC, HL  Monitor for signs and symptoms of bleeding   Pricilla Riffle, PharmD, BCPS Clinical Pharmacist 02/22/2023 7:44 PM

## 2023-02-23 ENCOUNTER — Inpatient Hospital Stay (HOSPITAL_COMMUNITY): Payer: Medicare Other

## 2023-02-23 DIAGNOSIS — A419 Sepsis, unspecified organism: Secondary | ICD-10-CM | POA: Diagnosis not present

## 2023-02-23 DIAGNOSIS — G9340 Encephalopathy, unspecified: Secondary | ICD-10-CM | POA: Diagnosis not present

## 2023-02-23 LAB — COMPREHENSIVE METABOLIC PANEL
ALT: 15 U/L (ref 0–44)
AST: 60 U/L — ABNORMAL HIGH (ref 15–41)
Albumin: 1.6 g/dL — ABNORMAL LOW (ref 3.5–5.0)
Alkaline Phosphatase: 131 U/L — ABNORMAL HIGH (ref 38–126)
Anion gap: 4 — ABNORMAL LOW (ref 5–15)
BUN: 16 mg/dL (ref 8–23)
CO2: 20 mmol/L — ABNORMAL LOW (ref 22–32)
Calcium: 7 mg/dL — ABNORMAL LOW (ref 8.9–10.3)
Chloride: 103 mmol/L (ref 98–111)
Creatinine, Ser: 0.47 mg/dL (ref 0.44–1.00)
GFR, Estimated: >60 mL/min (ref >60)
Glucose, Bld: 113 mg/dL — ABNORMAL HIGH (ref 70–99)
Potassium: 3.5 mmol/L (ref 3.5–5.1)
Sodium: 127 mmol/L — ABNORMAL LOW (ref 135–145)
Total Bilirubin: 0.6 mg/dL (ref <1.2)
Total Protein: 5.3 g/dL — ABNORMAL LOW (ref 6.5–8.1)

## 2023-02-23 LAB — GRAM STAIN: Gram Stain: NONE SEEN

## 2023-02-23 LAB — CBC
HCT: 31.7 % — ABNORMAL LOW (ref 36.0–46.0)
Hemoglobin: 10.2 g/dL — ABNORMAL LOW (ref 12.0–15.0)
MCH: 30.4 pg (ref 26.0–34.0)
MCHC: 32.2 g/dL (ref 30.0–36.0)
MCV: 94.3 fL (ref 80.0–100.0)
Platelets: 104 10*3/uL — ABNORMAL LOW (ref 150–400)
RBC: 3.36 MIL/uL — ABNORMAL LOW (ref 3.87–5.11)
RDW: 17.4 % — ABNORMAL HIGH (ref 11.5–15.5)
WBC: 10 10*3/uL (ref 4.0–10.5)
nRBC: 0 % (ref 0.0–0.2)

## 2023-02-23 LAB — GLUCOSE, CAPILLARY
Glucose-Capillary: 108 mg/dL — ABNORMAL HIGH (ref 70–99)
Glucose-Capillary: 127 mg/dL — ABNORMAL HIGH (ref 70–99)
Glucose-Capillary: 103 mg/dL — ABNORMAL HIGH (ref 70–99)
Glucose-Capillary: 119 mg/dL — ABNORMAL HIGH (ref 70–99)
Glucose-Capillary: 90 mg/dL (ref 70–99)

## 2023-02-23 LAB — HEPARIN LEVEL (UNFRACTIONATED): Heparin Unfractionated: 0.32 [IU]/mL (ref 0.30–0.70)

## 2023-02-23 NOTE — Progress Notes (Signed)
PHARMACY - ANTICOAGULATION CONSULT NOTE  Pharmacy Consult for IV heparin Indication: pulmonary embolus  Allergies  Allergen Reactions   Celecoxib Other (See Comments)    Insomnia   Duloxetine Hcl Other (See Comments)    Insomnia   Tizanidine Nausea And Vomiting   Venlafaxine Other (See Comments)    Hallucinations   Effersyllium [Psyllium] Other (See Comments)    Made the patient "feel badly"   Escitalopram Other (See Comments)    insomnia   Neurontin [Gabapentin] Other (See Comments)    Made the patient "feel badly"   Tramadol Other (See Comments)    Made the patient "feel badly"    Patient Measurements: Height: 5\' 5"  (165.1 cm) Weight: 52.1 kg (114 lb 13.8 oz) IBW/kg (Calculated) : 57 Heparin Dosing Weight: 49.4 kg   Vital Signs: Temp: 98.5 F (36.9 C) (11/24 0318) Temp Source: Oral (11/24 0318) BP: 114/72 (11/24 0400) Pulse Rate: 88 (11/24 0400)  Labs: Recent Labs    02/21/23 0316 02/22/23 0926 02/22/23 0927 02/22/23 1903 02/23/23 0305  HGB 11.7*  --  10.3*  --  10.2*  HCT 37.2  --  30.3*  --  31.7*  PLT 83*  --  83*  --  104*  LABPROT 16.3* 17.0*  --   --   --   INR 1.3* 1.4*  --   --   --   HEPARINUNFRC  --   --  0.19* 0.38 0.32  CREATININE 0.43*  --  0.45  --  0.47    Estimated Creatinine Clearance: 56.9 mL/min (by C-G formula based on SCr of 0.47 mg/dL).   Medical History: Past Medical History:  Diagnosis Date   Acute metabolic encephalopathy    Chronic back pain    DJD (degenerative joint disease)    Essential hypertension 07/10/2020   Gallbladder sludge    Hepatitis C 07/10/2020   Liver cirrhosis (HCC)    Pain management    Prolonged QT interval    Protein calorie malnutrition (HCC)    Thyroid nodule    Tobacco use 07/10/2020    Medications:  Medications Prior to Admission  Medication Sig Dispense Refill Last Dose   morphine (MS CONTIN) 30 MG 12 hr tablet Take 30 mg by mouth every 8 (eight) hours.   02/12/2023 at 2100   pregabalin  (LYRICA) 300 MG capsule Take 300 mg by mouth in the morning and at bedtime.   02/12/2023 at pm   carvedilol (COREG) 3.125 MG tablet Take 1 tablet (3.125 mg total) by mouth 2 (two) times daily with a meal. (Patient not taking: Reported on 02/13/2023) 60 tablet 0 Not Taking   cyclobenzaprine (FLEXERIL) 10 MG tablet Take 1 tablet (10 mg total) by mouth at bedtime as needed for muscle spasms. (Patient not taking: Reported on 02/13/2023) 30 tablet 0 Not Taking   morphine (MS CONTIN) 15 MG 12 hr tablet Take 1 tablet (15 mg total) by mouth every 12 (twelve) hours. (Patient not taking: Reported on 02/13/2023)   Not Taking    Assessment: Pharmacy is consulted to start heparin drip on 67 yo female diagnosed with PE. CT of chest today indicates right lower lobe segmental and subsegmental pulmonary emboli. No evidence of right heart strain. Pt not currently on anticoagulation.   Today, 02/23/23 Heparin level 0.32, barely therapeutic on heparin 1000 units/hr Hgb 10.2--stable, plt 104--low but improved INR elevated this admission (1.4 yesterday), likely due to cirrhosis  No s/sx of bleeding reported  Goal of Therapy:  Heparin level  0.3-0.7 units/ml Monitor platelets by anticoagulation protocol: Yes   Plan:  Slightly increase heparin infusion to 1050 units/hr  Daily CBC, HL  Monitor for signs and symptoms of bleeding    Cherylin Mylar, PharmD Clinical Pharmacist  11/24/20247:45 AM

## 2023-02-23 NOTE — Progress Notes (Signed)
NAME:  Anita Price, MRN:  [id number], DOB:  28-Jul-1955, LOS: 10 ADMISSION DATE:  02/13/2023, CONSULTATION DATE:  02/13/23 REFERRING MD:  Robb Matar - TRH, CHIEF COMPLAINT:  weakness6   History of Present Illness:  67 yo F PMH hep C, cirrhosis, HE, chronic pain, prolonged qtc who presented to [facility] ED 11/14 for 3-4d hx weakness + anorexia, n/v. In ED she was noted to be altered, unable to provide much reliable history. Had a CXR which showed L effusion. Underwent CT H c/a/p -- no intracranial abnormality, large L loculated pleural effusion, new thickening of endometrium, liver cirrhosis, splenomegaly, multiple splenic infarcts. Her labs revealed an AKI, meta cidosis, elevated LFTs, electrolyte disturbances, elevated ammonia, lactic acidosis, thrombocytopenia and coagulopathy. Mild leukocytosis. Started on vanc cefepime flagyl.  Underwent thora with IR, 1L milky purulent fluid off. Sent for cx. CXR post procedure with some residual effusion.  PCCM consulted in this setting, for pulm consult to see 11/15 AM.    In d/w daughter 11/15, this came out of nowhere a few days ago. Usually takes care of nephews and is functioning well w her chronic dz processes. Has had encephalopathy in the past r/t infections. Baseline opioid dependence -- follows w pain clinic in [place] & is considering a SCS trial   Pertinent  Medical History  Chronic pain DJD Prolonged qtc Protein calorie malnutrition Cirrhosis Hep C Hepatic encephalopathy   Significant Hospital Events: Including procedures, antibiotic start and stop dates in addition to other pertinent events   11/14 Admit to [facility]. IR thora.  11/15 pccm for chest tube, empyema LD> 10K, 71K TNC, 100 neutrophils and was turbid appearing   11/18 775 out from chest tube in the last 24hrs. S/p left apical chest tube placed by IR 11/19 Last 24 hours chest tube out from left lateral 840 cc and left apical 526 cc 11/20 24hr output 160 from lateral tube, 350 from anterior  tube.  11/21 Significant output from the more distal of the chest tubes over 1 liter per RN 11/22 out of apical chest tube. Left lower tube removed with possible subdiaphragmatic placement 11/24 Remove apical chest tube  Interim History / Subjective:  Apical chest tube with 0 output. Will plan to remove today. Not a candidate for lytics with previous lytics administered earlier this admission and now currently on systemic anticoagulation  Prior lateral chest tube drain site with ~1L output in the last 24 hours however 0 output since am shift change  Objective   Blood pressure (!) 125/59, pulse 92, temperature 98.4 F (36.9 C), temperature source Oral, resp. rate (!) 22, height 5\' 5"  (1.651 m), weight 52.1 kg, SpO2 94%.        Intake/Output Summary (Last 24 hours) at 02/23/2023 1008 Last data filed at 02/23/2023 0716 Gross per 24 hour  Intake 1280.06 ml  Output 1875 ml  Net -594.94 ml   Filed Weights   02/21/23 0300 02/22/23 0500 02/23/23 0638  Weight: 49.4 kg 53.6 kg 52.1 kg   Physical Exam: General: Chronically ill-appearing, no acute distress HENT: [place], AT, OP clear, MMM Eyes: EOMI, no scleral icterus Respiratory: Diminished but clear to auscultation bilaterally.  No crackles, wheezing or rales Cardiovascular: RRR, -M/R/G, no JVD GI: Mildly distended with fluid wave, BS+, soft, nontender Extremities:-Edema,-tenderness Neuro: AAO x2, CNII-XII grossly intact  Imaging, labs and test in EMR in the last 24 hours reviewed independently by me. Pertinent findings below: CTA 11/22 with RLL segmental and subsegmental PE, loculated pleural effusion,  cirrhosis, ascites  Resolved Hospital Problem list     Assessment & Plan:   Strep intermedius empyema on left Acute encephalopathy 2/2 sepsis Chronic opiate dependent pain Dysphagia Frail Severe protein calorie malnutrition Hep C cirrhosis w/ splenic sequestration of platelets with ascites Newly diagnosed RLL pulmonary  emboli -Thora 11/14: 1 liter milky white output -Left lower chest tube 11/15 > lytics 11/15 and 11/16 without much response.  -Additional apical chest tube placed 11/18 -Left lower chest tube removed 11/22  Apical chest tube with 0 output. Will plan to remove today. Although small loculated apical left effusion present, not a candidate for lytics with previous lytics administered earlier this admission and now currently on systemic anticoagulation  Possible left lower chest tube placement may have been partially subdiaphragmatic and having leakage due to moderate ascites. Of note, pleural effusion did improve with placement of both chest tubes so do believe this was in pleural space initially and may have migrated. Had discussed with radiology.  P: Continue Cefazolin. Recommend total 4 weeks treatment Remove left apical chest tube Monitor prior left lateral chest tube drain site for further output  Best Practice (right click and "Reselect all SmartList Selections" daily)  Per primary   Critical care time: N/A   Care Time: 37 min  Mechele Collin, M.D. [facility] Pulmonary/Critical Care Medicine 02/23/2023 10:08 AM   See Amion for personal pager For hours between 7 PM to 7 AM, please call Elink for urgent questions

## 2023-02-23 NOTE — Progress Notes (Signed)
PCCM Progress Note  Left anterior chest tube removed. CXR ordered

## 2023-02-23 NOTE — Progress Notes (Addendum)
HOSPITALIST ROUNDING NOTE Anita Price UVO:[id number]  DOB: 08-04-1955  DOA: 02/13/2023  PCP: Jackie Plum, MD  02/23/2023,2:07 PM   LOS: 10 days      Code Status: DNR From: Home  current Dispo: Unclear     67 year old white female Chronic hepatitis C which has been untreated in the past--underlying cirrhosis, tobacco abuse Chronic pain syndrome due to chronic back pain--- underlying cervical radiculopathy status post C3-C6 ACDF Dr. Yevette Edwards--- follows with pain clinic in [place] is debating spinal cord stimulator trial Degenerative joint disease HTN Previous thyroid nodule  Presented to emergency room 02/13/2019 for generalized weakness anorexia emesis-she was confused disoriented to time and date Workup revealed large loculated left pleural effusion endometrial thickening splenic infarcts  11/14: Admitted on antibiotics, thoracentesis with purulent material 11/15: Pulmonology consulted, chest tube inserted 11/16: Breathing worse; Up to 35L HFNC; Lytics given 11/17: IR consulted for second chest tube 11/18: Second chest tube placed 11/19: Dobbhoff Cortrak placed--- palliative medicine consulted additionally for goals of care--remains full code   Significant studies: 11/14 CT head: NAD 11/14 CT chest: large left pleural effusion 11/15 Echo: Normal EF, normal valves 11/18 CT chest repeat moderate large volume left pleural effusion 20 cm greatest dimension, left base pigtail catheter drainage subscribe pragmatic right basilar nodular pulmonary opacities multifocal pneumonia 11/22 CT chest = substantial improvement left empyema lateral component completely evacuated residue 2 loculations 2.8 X3.9, 2.4 X3.1 11/22 CT abdomen pelvis cirrhosis liver portal hypertension gastrorenal shunt distal varices moderate ascites anasarca-probable splenic infarct?  Laceration seems stable from prior with no extravasation?  Subsegmental pulmonary embolus posterior basilar right lower lobe 11/20 repeat  CT chest with contrast shows right lower lobe segmental and subsegmental emboli no heart strain loculated effusion left with pleural thickening left lower lobe atelectasis small right pleural effusion with patchy airspace disease and cirrhosis?  Splenic infarct again seen   Significant microbiology data: 11/14 Bcx x1: NGTD 11/14 pleural fluid: Strep intermedius   Procedures: 11/14: Thoracentesis 11/15: Chest tube insertion 11/16: Pleural lytics 11/22: Lower right-sided chest tube removed-currently leaking however about 400 cc 11/23: all chest tubes removed 11/23: abd paracentesis done 450 cc   Plan  Strep intermedius sepsis with empyema  large loculated effusion + chest tubes as above Thrombocytopenia Untreated hep C with cirrhosis Continues on Ancef monotherapy at this time--blood cultures on admission NGTD Pleural fluid cultures =strep intermedius Management as per pulmonary-ches tubes removed  LEaking chest tube site LLL Defer to CCM--suture was placed by CCM but still leakage--migt need gen surgery intervention if no better in am  Right sided PE Subsegmental PE additionally on CT scan Heparin GTT started 11/22-Will need probable 6 to 8 months of treatment Echocardiogram done recently therefore as patient is hemodynamically stable would hold repeat  Abd pain, splenic infarct ascites + distention--SAAG < 1.1---in determinant if 2/2 portal htn--paracetesis done as above Indices do not suggest SBP  Abx for Empyema alone  diarr D/w Dr. [name] ? 2/2 feeds-discontinue C. difficile precautions,--Start Imodium scheduled 4mg  qid and observe if slows  Acute metabolic encephalopathy secondary to sepsis---has imrpioved well over 11/22 Unlikely hepatic encephalopathy-initially lactulose, Xifaxan but held 2/2 diarrhea---  Severe protein energy malnutrition on dysphagia diet Trial with core track --encourage diet---calorie count pending Increase tube feeds per  protocol-continue Prosource 60 thiamine 100 daily Stop IV fluids 11/22  Electrolyte abnormalities, metabolic acidosis--acidosis is improved Hyponatremia intermittent-periodic lab checks--limit free water and fluid restrict 11/24 Hypokalemia replaced Albumin is very low corrected calcium normal  Impaired  glucose tolerance likely secondary to feeds-no workup unless above 180  Chronic pain syndrome from ACDF follows with pain clinic Continues on  MS IR 7.5 every 4 as needed--- home dose 30 every 8, hold high doses for now Stopped--Lyrica 300 twice daily, Flexeril 10 at bedtime and MS Contin 15 every 12  Degenerative joint disease NOS  Endometrial thickening Needs outpatient characterization not emergently  Thyroid nodule Needs outpatient characterization not emergently   I had a long discussion with the patient about trying to eat and improving her nutritional status- Discussed with daughter in person 11/24  DVT prophylaxis: SCD  Status is: Inpatient Remains inpatient appropriate because:   Mentation much improved Remaining issues are poor appetite, draining L side where pleurx was placed and dispo--likely can d/c if eating enough in the next several days-- she will need SNF Can triage to floor if stable and bed needed    Subjective:  Leakage at LL side remains and re-in forced dressing placed. No fever Can orient to time place, cannot tell me month president Eating better   Objective + exam Vitals:   02/23/23 0842 02/23/23 1100 02/23/23 1200 02/23/23 1203  BP: (!) 125/59 105/65 119/70   Pulse: 92 72 82   Resp: (!) 22 20 (!) 22   Temp:    (!) 97.5 F (36.4 C)  TempSrc:    Oral  SpO2: 94% 94% 94%   Weight:      Height:       Filed Weights   02/21/23 0300 02/22/23 0500 02/23/23 0638  Weight: 49.4 kg 53.6 kg 52.1 kg    Examination:  EOMI NCAT no focal deficit cachectic Chest tubes out S1-S2 normal-on monitors seems to be in sinus  Abdomen no rebound, but  tender on deep palaption Neuro is intact no focal deficit--no asterixis    Data Reviewed: reviewed   CBC    Component Value Date/Time   WBC 10.0 02/23/2023 0305   RBC 3.36 (L) 02/23/2023 0305   HGB 10.2 (L) 02/23/2023 0305   HCT 31.7 (L) 02/23/2023 0305   PLT 104 (L) 02/23/2023 0305   MCV 94.3 02/23/2023 0305   MCH 30.4 02/23/2023 0305   MCHC 32.2 02/23/2023 0305   RDW 17.4 (H) 02/23/2023 0305   LYMPHSABS 1.8 02/18/2023 0307   MONOABS 0.7 02/18/2023 0307   EOSABS 0.2 02/18/2023 0307   BASOSABS 0.1 02/18/2023 0307      Latest Ref Rng & Units 02/23/2023    3:05 AM 02/22/2023    9:27 AM 02/21/2023    3:16 AM  CMP  Glucose 70 - 99 mg/dL 409  811  914   BUN 8 - 23 mg/dL 16  17  15    Creatinine 0.44 - 1.00 mg/dL 7.82  9.56  2.13   Sodium 135 - 145 mmol/L 127  130  134   Potassium 3.5 - 5.1 mmol/L 3.5  3.7  4.0   Chloride 98 - 111 mmol/L 103  104  107   CO2 22 - 32 mmol/L 20  21  22    Calcium 8.9 - 10.3 mg/dL 7.0  7.0  7.2   Total Protein 6.5 - 8.1 g/dL 5.3  5.4  5.6   Total Bilirubin <1.2 mg/dL 0.6  0.7  1.0   Alkaline Phos 38 - 126 U/L 131  129  138   AST 15 - 41 U/L 60  78  103   ALT 0 - 44 U/L 15  20  22  Scheduled Meds:  carvedilol  3.125 mg Per NG tube BID WC   Chlorhexidine Gluconate Cloth  6 each Topical Daily   feeding supplement  1 Container Oral TID BM   feeding supplement (PROSource TF20)  60 mL Per Tube Daily   loperamide  4 mg Oral QID   mouth rinse  15 mL Mouth Rinse 4 times per day   pantoprazole (PROTONIX) IV  40 mg Intravenous Q24H   sodium chloride flush  10 mL Intrapleural Q8H   spironolactone  50 mg Oral Daily   thiamine  100 mg Per Tube Daily   Continuous Infusions:   ceFAZolin (ANCEF) IV Stopped (02/23/23 0630)   feeding supplement (OSMOLITE 1.5 CAL) 1,000 mL (02/23/23 0552)   heparin 1,050 Units/hr (02/23/23 0839)    Time  45  Rhetta Mura, MD  [facility]

## 2023-02-23 NOTE — Plan of Care (Signed)
Chest tube removed per order by PCCM MD.  Declined repositioning multiple times.   Problem: Fluid Volume: Goal: Hemodynamic stability will improve Outcome: Progressing   Problem: Respiratory: Goal: Ability to maintain adequate ventilation will improve Outcome: Progressing   Problem: Education: Goal: Knowledge of General Education information will improve Description: Including pain rating scale, medication(s)/side effects and non-pharmacologic comfort measures Outcome: Progressing   Problem: Health Behavior/Discharge Planning: Goal: Ability to manage health-related needs will improve Outcome: Progressing   Problem: Clinical Measurements: Goal: Ability to maintain clinical measurements within normal limits will improve Outcome: Progressing Goal: Will remain free from infection Outcome: Progressing Goal: Diagnostic test results will improve Outcome: Progressing

## 2023-02-24 DIAGNOSIS — Z7189 Other specified counseling: Secondary | ICD-10-CM | POA: Diagnosis not present

## 2023-02-24 DIAGNOSIS — R4589 Other symptoms and signs involving emotional state: Secondary | ICD-10-CM | POA: Diagnosis not present

## 2023-02-24 DIAGNOSIS — Z515 Encounter for palliative care: Secondary | ICD-10-CM | POA: Diagnosis not present

## 2023-02-24 DIAGNOSIS — R630 Anorexia: Secondary | ICD-10-CM

## 2023-02-24 DIAGNOSIS — Z79899 Other long term (current) drug therapy: Secondary | ICD-10-CM

## 2023-02-24 DIAGNOSIS — A419 Sepsis, unspecified organism: Secondary | ICD-10-CM | POA: Diagnosis not present

## 2023-02-24 DIAGNOSIS — J869 Pyothorax without fistula: Secondary | ICD-10-CM | POA: Diagnosis not present

## 2023-02-24 DIAGNOSIS — J90 Pleural effusion, not elsewhere classified: Secondary | ICD-10-CM | POA: Diagnosis not present

## 2023-02-24 LAB — BASIC METABOLIC PANEL
Anion gap: 7 (ref 5–15)
BUN: 14 mg/dL (ref 8–23)
CO2: 21 mmol/L — ABNORMAL LOW (ref 22–32)
Calcium: 7.4 mg/dL — ABNORMAL LOW (ref 8.9–10.3)
Chloride: 102 mmol/L (ref 98–111)
Creatinine, Ser: 0.45 mg/dL (ref 0.44–1.00)
GFR, Estimated: >60 mL/min (ref >60)
Glucose, Bld: 131 mg/dL — ABNORMAL HIGH (ref 70–99)
Potassium: 3.3 mmol/L — ABNORMAL LOW (ref 3.5–5.1)
Sodium: 130 mmol/L — ABNORMAL LOW (ref 135–145)

## 2023-02-24 LAB — CBC
HCT: 31.8 % — ABNORMAL LOW (ref 36.0–46.0)
Hemoglobin: 10.6 g/dL — ABNORMAL LOW (ref 12.0–15.0)
MCH: 31.2 pg (ref 26.0–34.0)
MCHC: 33.3 g/dL (ref 30.0–36.0)
MCV: 93.5 fL (ref 80.0–100.0)
Platelets: 122 10*3/uL — ABNORMAL LOW (ref 150–400)
RBC: 3.4 MIL/uL — ABNORMAL LOW (ref 3.87–5.11)
RDW: 18.2 % — ABNORMAL HIGH (ref 11.5–15.5)
WBC: 9.6 10*3/uL (ref 4.0–10.5)
nRBC: 0 % (ref 0.0–0.2)

## 2023-02-24 LAB — GLUCOSE, CAPILLARY
Glucose-Capillary: 103 mg/dL — ABNORMAL HIGH (ref 70–99)
Glucose-Capillary: 124 mg/dL — ABNORMAL HIGH (ref 70–99)
Glucose-Capillary: 126 mg/dL — ABNORMAL HIGH (ref 70–99)
Glucose-Capillary: 134 mg/dL — ABNORMAL HIGH (ref 70–99)

## 2023-02-24 LAB — HEPARIN LEVEL (UNFRACTIONATED): Heparin Unfractionated: 0.47 [IU]/mL (ref 0.30–0.70)

## 2023-02-24 MED ORDER — OSMOLITE 1.5 CAL PO LIQD
1000.0000 mL | ORAL | Status: DC
  Filled 2023-02-24: qty 1000

## 2023-02-24 MED ORDER — OSMOLITE 1.5 CAL PO LIQD
360.0000 mL | ORAL | Status: DC
  Administered 2023-02-24: 360 mL
  Filled 2023-02-24: qty 474

## 2023-02-24 MED ORDER — DRONABINOL 2.5 MG PO CAPS
2.5000 mg | ORAL_CAPSULE | Freq: Two times a day (BID) | ORAL | Status: DC
  Administered 2023-02-24 – 2023-03-05 (×19): 2.5 mg via ORAL
  Filled 2023-02-24 (×19): qty 1

## 2023-02-24 MED ORDER — APIXABAN 5 MG PO TABS
10.0000 mg | ORAL_TABLET | Freq: Two times a day (BID) | ORAL | Status: AC
  Administered 2023-02-24 – 2023-03-02 (×13): 10 mg via ORAL
  Filled 2023-02-24 (×13): qty 2

## 2023-02-24 MED ORDER — PROSOURCE TF20 ENFIT COMPATIBL EN LIQD
60.0000 mL | Freq: Two times a day (BID) | ENTERAL | Status: DC
  Administered 2023-02-25: 60 mL
  Filled 2023-02-24 (×3): qty 60

## 2023-02-24 MED ORDER — APIXABAN 5 MG PO TABS
5.0000 mg | ORAL_TABLET | Freq: Two times a day (BID) | ORAL | Status: DC
  Administered 2023-03-03 – 2023-03-06 (×7): 5 mg via ORAL
  Filled 2023-02-24 (×7): qty 1

## 2023-02-24 MED ORDER — CEFADROXIL 500 MG PO CAPS
500.0000 mg | ORAL_CAPSULE | Freq: Two times a day (BID) | ORAL | Status: DC
  Administered 2023-02-24 – 2023-02-26 (×4): 500 mg via ORAL
  Filled 2023-02-24 (×5): qty 1

## 2023-02-24 NOTE — Progress Notes (Signed)
Speech Language Pathology Treatment: Dysphagia  Patient Details Name: Anita Price MRN: [id number] DOB: Jan 12, 1956 Today's Date: 02/24/2023 Time: 1100-1117 SLP Time Calculation (min) (ACUTE ONLY): 17 min  Assessment / Plan / Recommendation Clinical Impression  Patient seen by SLP for skilled intervention focused on dysphagia management goals.  Patient was awake when SLP entered the room and was cooperative throughout the session. Per documentation, mentation and level of alertness much improved on this date as compared to previous. Patient was directly observed with sips of thin liquid via straw without overt s/sx of aspiration. Patient reported she has not had any difficulties swallowing. SLP inquired about dys 3 (mechanical soft) diet toleration and patient stated that given she is edentulous, she would like to remain on dys 3. She stated that a regular solids diet would be too difficult to masticate fully without tiring. SLP recommending patient remain on dys 3, thin liquid diet at this time. Medication may be administered via thin liquid as tolerated. No further ST needs indicated at this time.   HPI HPI: Pt is a 67 yo presenting 11/14 with AMS and several days of N/V. Admitted with sepsis, possible PNA. CT Head negative; CXR on admission showed near complete opacification of the L hemithorax. Previous swallow eval in April 2023 with swallowing impacted by mentation at that time, but she advanced to regular solids and thin liquids quickly. PMH includes: hepatic encephalopathy, metabolic encephalopathy, history open hepatitis C, liver cirrhosis, chronic back pain, DJD, chronic pain syndrome, prolonged QT interval, protein calorie malnutrition, thyroid nodule. Pt is to have a CT chest, abdomen and pelvis 02/21/2023 with contrast      SLP Plan  Discharge SLP treatment due to (comment) (all goals met)      Recommendations for follow up therapy are one component of a multi-disciplinary discharge  planning process, led by the attending physician.  Recommendations may be updated based on patient status, additional functional criteria and insurance authorization.    Recommendations  Diet recommendations: Dysphagia 3 (mechanical soft);Thin liquid Liquids provided via: Straw;Cup Medication Administration: Whole meds with liquid (as tolerated) Supervision: Patient able to self feed Compensations: Slow rate;Small sips/bites;Minimize environmental distractions Postural Changes and/or Swallow Maneuvers: Seated upright 90 degrees;Upright 30-60 min after meal                  Oral care BID;Staff/trained caregiver to provide oral care   None Dysphagia, unspecified (R13.10)     Discharge SLP treatment due to (comment) (all goals met)     Marline Backbone, B.S., Speech Therapy Student    02/24/2023, 11:23 AM

## 2023-02-24 NOTE — Progress Notes (Addendum)
NAME:  Anita Price, MRN:  [id number], DOB:  Mar 29, 1956, LOS: 11 ADMISSION DATE:  02/13/2023, CONSULTATION DATE:  02/13/23 REFERRING MD:  Robb Matar - TRH, CHIEF COMPLAINT:  weakness6   History of Present Illness:  67 yo F PMH hep C, cirrhosis, HE, chronic pain, prolonged qtc who presented to [facility] ED 11/14 for 3-4d hx weakness + anorexia, n/v. In ED she was noted to be altered, unable to provide much reliable history. Had a CXR which showed L effusion. Underwent CT H c/a/p -- no intracranial abnormality, large L loculated pleural effusion, new thickening of endometrium, liver cirrhosis, splenomegaly, multiple splenic infarcts. Her labs revealed an AKI, meta cidosis, elevated LFTs, electrolyte disturbances, elevated ammonia, lactic acidosis, thrombocytopenia and coagulopathy. Mild leukocytosis. Started on vanc cefepime flagyl.  Underwent thora with IR, 1L milky purulent fluid off. Sent for cx. CXR post procedure with some residual effusion.  PCCM consulted in this setting, for pulm consult to see 11/15 AM.    In d/w daughter 11/15, this came out of nowhere a few days ago. Usually takes care of nephews and is functioning well w her chronic dz processes. Has had encephalopathy in the past r/t infections. Baseline opioid dependence -- follows w pain clinic in [place] & is considering a SCS trial   Pertinent  Medical History  Chronic pain DJD Prolonged qtc Protein calorie malnutrition Cirrhosis Hep C Hepatic encephalopathy   Significant Hospital Events: Including procedures, antibiotic start and stop dates in addition to other pertinent events   11/14 Admit to [facility]. IR thora.  11/15 pccm for chest tube, empyema LD> 10K, 71K TNC, 100 neutrophils and was turbid appearing   11/18 775 out from chest tube in the last 24hrs. S/p left apical chest tube placed by IR 11/19 Last 24 hours chest tube out from left lateral 840 cc and left apical 526 cc 11/20 24hr output 160 from lateral tube, 350 from anterior  tube.  11/21 Significant output from the more distal of the chest tubes over 1 liter per RN 11/22 out of apical chest tube. Left lower tube removed with possible subdiaphragmatic placement 11/24 Remove apical chest tube  Interim History / Subjective:  Left apical chest tube removed today  Overnight prior left lateral chest tube site with no output  Objective   Blood pressure (!) 106/58, pulse 86, temperature (!) 97.2 F (36.2 C), temperature source Axillary, resp. rate 16, height 5\' 5"  (1.651 m), weight 53.4 kg, SpO2 94%.        Intake/Output Summary (Last 24 hours) at 02/24/2023 0815 Last data filed at 02/24/2023 0800 Gross per 24 hour  Intake 1367.98 ml  Output 1200 ml  Net 167.98 ml   Filed Weights   02/22/23 0500 02/23/23 0638 02/24/23 0535  Weight: 53.6 kg 52.1 kg 53.4 kg   Physical Exam: General: Chronically ill-appearing, no acute distress HENT: [place], AT, OP clear, MMM Eyes: EOMI, no scleral icterus Respiratory: Diminished but clear to auscultation bilaterally.  No crackles, wheezing or rales Cardiovascular: RRR, -M/R/G, no JVD GI: BS+, distended with fluid wave, nontender Extremities:-Edema,-tenderness Neuro: AAO x2, CNII-XII grossly intact  Resolved Hospital Problem list     Assessment & Plan:   Strep intermedius empyema on left Acute encephalopathy 2/2 sepsis Chronic opiate dependent pain Dysphagia Frail Severe protein calorie malnutrition Hep C cirrhosis w/ splenic sequestration of platelets with ascites Newly diagnosed RLL pulmonary emboli -Thora 11/14: 1 liter milky white output -Left lower chest tube 11/15 > lytics 11/15 and 11/16 without  much response.  -Additional apical chest tube placed 11/18 -Left lower chest tube removed 11/22 -Left anterior chest tube removed 11/24  Small loculated apical left effusion present, not a candidate for lytics with previous lytics administered earlier this admission and now currently on systemic  anticoagulation. This will likely self resolve or scar down. Recommend antibiotics as noted below.  Regarding prior left lower chest tube placement site: may have been partially subdiaphragmatic and having leakage due to moderate ascites. Of note, pleural effusion did improve with placement of both chest tubes so do believe this was in pleural space initially and may have migrated. Had discussed with radiology. At this point, no further drainage. Advised primary team to monitor and drainage recurs, consider surgery consult  P: Continue Cefazolin. Recommend total 4 weeks treatment Monitor prior left lateral chest tube drain site for further output  Best Practice (right click and "Reselect all SmartList Selections" daily)  Per primary   Critical care time: N/A   Care Time: 25 min  Pulmonary team will sign off  Mechele Collin, M.D. [facility] Pulmonary/Critical Care Medicine 02/24/2023 8:15 AM   See Amion for personal pager For hours between 7 PM to 7 AM, please call Elink for urgent questions

## 2023-02-24 NOTE — Progress Notes (Signed)
Nutrition Follow-up  DOCUMENTATION CODES:   Non-severe (moderate) malnutrition in context of chronic illness  INTERVENTION:  - 48 hour calorie count to start today (11/25) at breakfast and run through tomorrow (11/26) after dinner.  - Please document % intake of all foods, drinks, and nutrition supplements patient consumes on meal tickets and place in envelope on patient's door.  - If patient skips/refuses meals please document 0% for that meal.  - Discussed with RN.  - DYS 3 diet per SLP. - Boost Breeze po TID, each supplement provides 250 kcal and 9 grams of protein - Encourage intake at all meals and of supplements.  - Marinol for appetite stimulant.  - Plan to transition to nocturnal TF to promote a better appetite during the day. - Tonight: Osmolite 1.5 at 35mL/hr for 12 hours overnight (1800-0600) + ProSource Plus BID - Tomorrow evening (11/26): Osmolite 1.5 at 60mL/hr for 12 hours overnight (1800-0600) + ProSource Plus BID  - Provides: 880 kcals (53% needs), 70g protein (88% needs), and free water  - Continue to monitor magnesium, potassium, and phosphorus, MD to replete as needed.  - Monitor weight trends.  NUTRITION DIAGNOSIS:   Moderate Malnutrition related to chronic illness as evidenced by moderate fat depletion, severe muscle depletion. *ongoing  GOAL:   Patient will meet greater than or equal to 90% of their needs *progressing, on TF and oral diet  MONITOR:   PO intake, Supplement acceptance, Labs, Weight trends, TF tolerance  REASON FOR ASSESSMENT:   Consult Enteral/tube feeding initiation and management  ASSESSMENT:   67 y.o. F with cirrhosis, HTH, hx HE who presented with vomiting and decreased mentation, found to have empyema and sepsis.  11/14 Admit , s/p thoracentesis 1L yield 11/15 Left lateral chest tube placed 11/18 Left anterior chest tube placed, diet: dysphagia 3 11/19 small bore NGT placed, TF started 11/22 Left lateral chest tube  removed 11/23 Calorie count started  Patient much more alert at time of visit today, answering all questions appropriately.   Reports no issues with TF. She has remained at 36mL/hr since tube feeds started 11/19.   She endorses very poor appetite and also poor dentition making it difficult to eat. SLP following and patient on DYS 3 diet for easier chewing. SLP signed off today.  Patient has been getting automatic trays. She has also been getting Boost Breeze and accepting supplement 1-2 times per day.   Unfortunately, calorie count conducted over the weekend did not follow with patient when she transferred rooms this AM.  Will restart calorie count to begin today and run through tomorrow.   Stressed importance of trying to eat well with 3 meals a day. Encouraged intake of supplements in addition to meals.   As biggest issue seems to be appetite, will plan to transition to nocturnal TF to support a better appetite during the day. Change discussed with MD who is in agreement. Will plan for goal nocturnal TF to be 23mL/hr to continue to provide patient with ~50% of kcal needs. Will also increase ProSource to BID to meet ~90% of protein needs. Palliative care also planning to start Marinol today for appetite stimulant.   Admit weight: 103# Current weight:  117# I&O's: +3.4L + generalized edema  Medications reviewed and include: -  Labs reviewed:  Na 130 K+ 3.3   Diet Order:   Diet Order             DIET DYS 3 Room service appropriate? Yes; Fluid consistency: Thin;  Fluid restriction: 1500 mL Fluid  Diet effective now                   EDUCATION NEEDS:  Not appropriate for education at this time  Skin:  Skin Assessment: Reviewed RN Assessment  Last BM:  11/25 - rectal tube  Height:  Ht Readings from Last 1 Encounters:  02/13/23 5\' 5"  (1.651 m)   Weight:  Wt Readings from Last 1 Encounters:  02/24/23 53.4 kg    BMI:  Body mass index is 19.59 kg/m.  Estimated  Nutritional Needs:  Kcal:  1650-1800 kcals Protein:  80-100 grams Fluid:  >/= 1.7L    Shelle Iron RD, LDN For contact information, refer to [facility].

## 2023-02-24 NOTE — Progress Notes (Signed)
PHARMACY - ANTICOAGULATION CONSULT NOTE  Pharmacy Consult for IV heparin Indication: pulmonary embolus  Allergies  Allergen Reactions   Celecoxib Other (See Comments)    Insomnia   Duloxetine Hcl Other (See Comments)    Insomnia   Tizanidine Nausea And Vomiting   Venlafaxine Other (See Comments)    Hallucinations   Effersyllium [Psyllium] Other (See Comments)    Made the patient "feel badly"   Escitalopram Other (See Comments)    insomnia   Neurontin [Gabapentin] Other (See Comments)    Made the patient "feel badly"   Tramadol Other (See Comments)    Made the patient "feel badly"    Patient Measurements: Height: 5\' 5"  (165.1 cm) Weight: 53.4 kg (117 lb 11.6 oz) IBW/kg (Calculated) : 57 Heparin Dosing Weight: 49.4 kg   Vital Signs: Temp: 97.5 F (36.4 C) (11/25 0800) Temp Source: Oral (11/25 0800) BP: 106/58 (11/25 0800) Pulse Rate: 86 (11/25 0800)  Labs: Recent Labs    02/22/23 0926 02/22/23 0927 02/22/23 0927 02/22/23 1903 02/23/23 0305 02/24/23 0310  HGB  --  10.3*   < >  --  10.2* 10.6*  HCT  --  30.3*  --   --  31.7* 31.8*  PLT  --  83*  --   --  104* 122*  LABPROT 17.0*  --   --   --   --   --   INR 1.4*  --   --   --   --   --   HEPARINUNFRC  --  0.19*   < > 0.38 0.32 0.47  CREATININE  --  0.45  --   --  0.47  --    < > = values in this interval not displayed.    Estimated Creatinine Clearance: 58.3 mL/min (by C-G formula based on SCr of 0.47 mg/dL).   Medical History: Past Medical History:  Diagnosis Date   Acute metabolic encephalopathy    Chronic back pain    DJD (degenerative joint disease)    Essential hypertension 07/10/2020   Gallbladder sludge    Hepatitis C 07/10/2020   Liver cirrhosis (HCC)    Pain management    Prolonged QT interval    Protein calorie malnutrition (HCC)    Thyroid nodule    Tobacco use 07/10/2020    Medications:  Medications Prior to Admission  Medication Sig Dispense Refill Last Dose   morphine (MS CONTIN)  30 MG 12 hr tablet Take 30 mg by mouth every 8 (eight) hours.   02/12/2023 at 2100   pregabalin (LYRICA) 300 MG capsule Take 300 mg by mouth in the morning and at bedtime.   02/12/2023 at pm   carvedilol (COREG) 3.125 MG tablet Take 1 tablet (3.125 mg total) by mouth 2 (two) times daily with a meal. (Patient not taking: Reported on 02/13/2023) 60 tablet 0 Not Taking   cyclobenzaprine (FLEXERIL) 10 MG tablet Take 1 tablet (10 mg total) by mouth at bedtime as needed for muscle spasms. (Patient not taking: Reported on 02/13/2023) 30 tablet 0 Not Taking   morphine (MS CONTIN) 15 MG 12 hr tablet Take 1 tablet (15 mg total) by mouth every 12 (twelve) hours. (Patient not taking: Reported on 02/13/2023)   Not Taking    Assessment: Pharmacy is consulted to start heparin drip on 67 yo female diagnosed with PE. CT of chest today indicates right lower lobe segmental and subsegmental pulmonary emboli. No evidence of right heart strain. Pt not currently on anticoagulation.  Today, 02/24/23 Heparin level 0.47, therapeutic on heparin 1050 units/hr Hgb 10.6--stable, plt 122--low but improving INR elevated this admission (1.4 on 11/23), likely due to cirrhosis  No s/sx of bleeding reported  Goal of Therapy:  Heparin level 0.3-0.7 units/ml Monitor platelets by anticoagulation protocol: Yes   Plan:  Continue heparin infusion at 1050 units/hr  Daily CBC, HL  Monitor for signs and symptoms of bleeding    Adalberto Cole, PharmD, BCPS 02/24/2023 8:41 AM

## 2023-02-24 NOTE — Evaluation (Signed)
Occupational Therapy Evaluation Patient Details Name: Anita Price MRN: [id number] DOB: 09-11-1955 Today's Date: 02/24/2023   History of Present Illness Patient is a 67 year old female who presented on 11/14 with confusion. Patient was found to have strep intermedius empyema, acute encephalopathy, severe protein calorie malnutrition, and RLL pulmonary emboli. Patient had multiple chest tubes from 11/5 to 11/24.   PMH: Hep C, cirrhosis, chronic pain, anorexia, tobacco use, chronic pain syndrome, ACDF c3-c6   Clinical Impression   Patient is a 67 year old female who was admitted for above. Patient reported living at home with daughter with unclear level of assistance for ADLs. Conflicting PLOF reports from patient during session. Patient was agreeable to move with therapist and then when offering A to advance to EOB patient declined to move without clear reason. Of note patient did have increased sensitivity in BLE especially R calf with attempting to get to EOB. Nurse made aware.Patient was noted to have decreased functional activity tolernace, decreased ROM, decreased BUE strength, decreased endurance, decreased sitting balance, decreased standing balanced, decreased safety awareness, and decreased knowledge of AE/AD impacting participation in ADLs. With unclear level of support at home and current level of assistance being 24/7 caregiver support. Patient will benefit from continued inpatient follow up therapy, <3 hours/day.  Patient would continue to benefit from skilled OT services at this time while admitted and after d/c to address noted deficits in order to improve overall safety and independence in ADLs.         If plan is discharge home, recommend the following: Two people to help with walking and/or transfers;A lot of help with bathing/dressing/bathroom;Assistance with cooking/housework;Direct supervision/assist for medications management;Assist for transportation;Help with stairs or ramp for  entrance;Direct supervision/assist for financial management    Functional Status Assessment  Patient has had a recent decline in their functional status and demonstrates the ability to make significant improvements in function in a reasonable and predictable amount of time.  Equipment Recommendations  None recommended by OT       Precautions / Restrictions Precautions Precautions: Fall Precaution Comments: cervical precautions(H/O ACDF), cortrack Restrictions Weight Bearing Restrictions: No      Mobility Bed Mobility Overal bed mobility: Needs Assistance             General bed mobility comments: patient declined to complete roll and unable to bend knees up to assist with getting into sidelying at bed level. patient then asking to be left alone. session stopped.            ADL either performed or assessed with clinical judgement   ADL Overall ADL's : Needs assistance/impaired Eating/Feeding: Set up;Bed level Eating/Feeding Details (indicate cue type and reason): cortrax in place. Grooming: Bed level;Moderate assistance   Upper Body Bathing: Bed level;Total assistance   Lower Body Bathing: Bed level;Maximal assistance   Upper Body Dressing : Bed level;Moderate assistance   Lower Body Dressing: Bed level;Total assistance Lower Body Dressing Details (indicate cue type and reason): patient with limited movement of BLE with increased tenderness with touch BLE> nurse made aware of sesitivitiy in R calf.   Toilet Transfer Details (indicate cue type and reason): declined to attempt to get to EOB Toileting- Clothing Manipulation and Hygiene: Bed level;Total assistance               Vision   Vision Assessment?: No apparent visual deficits            Pertinent Vitals/Pain Pain Assessment Pain Assessment: Faces Faces Pain  Scale: Hurts even more Pain Location: pain in R calf with touch, sensitivities in toes as well (no redness observed limited movement of BLE  to be able to fully observe. patient decliened for therapist to touch or assess further) Pain Descriptors / Indicators: Discomfort, Grimacing, Guarding Pain Intervention(s): Limited activity within patient's tolerance, Monitored during session, Repositioned, Other (comment) (nurse made aware)     Extremity/Trunk Assessment Upper Extremity Assessment Upper Extremity Assessment: Generalized weakness;Right hand dominant (ROM WFL)   Lower Extremity Assessment Lower Extremity Assessment: Defer to PT evaluation (limited movement in knees, and ankles with sensitivity in toes/toe nails with blanket movement.)   Cervical / Trunk Assessment Cervical / Trunk Assessment: Normal   Communication Communication Communication: No apparent difficulties   Cognition Arousal: Alert Behavior During Therapy: Flat affect Overall Cognitive Status: Difficult to assess         General Comments: patient reported date was 05/03/22. when asked what month patient reported November and the year was correct as well. patient agreeable to participate in session then once room was set up patient declined to move further.     General Comments  patient reporting increased pain with touch on calf. unable to fully visualize underside of leg with limited ability to move RLE in bed. patient declined for therapist to touch it after removing SCDs. nurse made aware.            Home Living Family/patient expects to be discharged to:: Private residence Living Arrangements: Children Available Help at Discharge: Family;Available PRN/intermittently Type of Home: Apartment       Home Layout: One level               Home Equipment: Agricultural consultant (2 wheels)          Prior Functioning/Environment Prior Level of Function : Independent/Modified Independent                        OT Problem List: Decreased activity tolerance;Impaired balance (sitting and/or standing);Decreased coordination;Decreased  safety awareness;Decreased knowledge of precautions;Decreased knowledge of use of DME or AE;Pain;Decreased strength;Decreased cognition;Decreased range of motion      OT Treatment/Interventions: Self-care/ADL training;Therapeutic exercise;DME and/or AE instruction;Therapeutic activities;Patient/family education;Balance training    OT Goals(Current goals can be found in the care plan section) Acute Rehab OT Goals Patient Stated Goal: to stay in bed OT Goal Formulation: Patient unable to participate in goal setting Time For Goal Achievement: 03/10/23 Potential to Achieve Goals: Fair  OT Frequency: Min 1X/week       AM-PAC OT "6 Clicks" Daily Activity     Outcome Measure Help from another person eating meals?: A Little Help from another person taking care of personal grooming?: A Lot Help from another person toileting, which includes using toliet, bedpan, or urinal?: Total Help from another person bathing (including washing, rinsing, drying)?: Total Help from another person to put on and taking off regular upper body clothing?: Total Help from another person to put on and taking off regular lower body clothing?: Total 6 Click Score: 9   End of Session Nurse Communication: Mobility status;Other (comment) (pain and sensitivity in BLE especially R calf)  Activity Tolerance: Patient limited by pain Patient left: in bed;with call bell/phone within reach  OT Visit Diagnosis: Muscle weakness (generalized) (M62.81);Pain;Other abnormalities of gait and mobility (R26.89)                Time: 1308-6578 OT Time Calculation (min): 15 min Charges:  OT General Charges $  OT Visit: 1 Visit OT Evaluation $OT Eval Low Complexity: 1 Low  Anurag Scarfo OTR/L, MS Acute Rehabilitation Department Office# [phone]   Selinda Flavin 02/24/2023, 2:43 PM

## 2023-02-24 NOTE — Progress Notes (Signed)
Daily Progress Note   Patient Name: Anita Price       Date: 02/24/2023 DOB: 12-19-1955  Age: 67 y.o. MRN#: [id number] Attending Physician: Rhetta Mura, MD Primary Care Physician: Jackie Plum, MD Admit Date: 02/13/2023 Length of Stay: 11 days  Reason for Consultation/Follow-up: Establishing goals of care  Subjective:   CC: Patient sitting up in bed noting she is feeling "better". Following up regarding complex medical decision making.   Subjective:  Reviewed EMR prior to presenting to bedside.  Patient has been transferred out of ICU to floor room.  Presented to bedside to meet with patient.  Patient's daughter, Efraim Kaufmann, present at bedside.  Able to introduce myself in person since we had talked multiple times over the phone.  Patient laying comfortably in bed.  RN present at bedside as well. Patient able to discuss that she is feeling "better" at this time.  She feels that her breathing is improving.  Patient still does not have an appetite at this time.  Her diet is already limited due to her dentition.  Discussed starting appetite stimulant.  Did review that appetite stimulants will not magically fix underlying problem.  Hope is that can improve patient's appetite to the point where she can regain strength and regain her appetite.  Patient has been sleeping well so discussed will not start nighttime medication.  Patient denied any history of hallucinations or mental health disorders (beyond uncomplicated OUD).  Discussed and will start patient on low-dose Marinol at lunch and dinner.  Informed patient that if feels medication affects her mentation at all, please inform staff so it can be discontinued and can try a different appetite stimulant.  Patient is motivated to get home.  Discussed neck steps would include being able to eat enough calories to maintain her needs.  Need to get cortrack tube removed.  Also need to have patient work with physical therapy to regain  strength.  Patient motivated to work with physical therapy at this time.  Noted will reach out to hospitalist about consulting them.  Time answering questions as able.  Noted palliative medicine team will continue to follow along with patient's medical journey.  This palliative provider is going off service though palliative medicine team would continue to follow along with.  Patient and daughter voiced appreciation for discussion today.   Vital Signs:  BP (!) 106/58 (BP Location: Left Wrist)   Pulse 86   Temp (!) 97.5 F (36.4 C) (Oral)   Resp 16   Ht 5\' 5"  (1.651 m)   Wt 53.4 kg   SpO2 94%   BMI 19.59 kg/m   Physical Exam: General: NAD, chronically ill appearing, cachectic, frail  HENT: poor dentition, Cortrack in place Cardiovascular: RRR Respiratory: not in respiratory distress, increased work of breathing noted  Neuro: Interactive, appropriately answering questions, pleasant  Imaging: I personally reviewed recent imaging.   Assessment & Plan:   Assessment: Patient is a 67 year old female with a past medical history of cirrhosis in the setting of untreated hepatitis C, uncomplicated opioid dependence, protein calorie malnutrition, and hypertension who was admitted on 02/13/2023 for management of vomiting and decreased mentation.  During admission patient was found to have empyema and sepsis secondary to Streptococcus.  Patient has received management for empyema including antibiotics and chest tube placement.  Patient not a candidate for VATS.  Pulmonology following recommendations.  Patient also receiving management for AKI and encephalopathy.  Palliative medicine team consulted to assist with complex medical decision making.  Recommendations/Plan: # Complex medical decision making/goals of care:  -Able to discuss care with patient while daughter, Efraim Kaufmann, present at bedside today.  Patient appropriately interactive today.  Patient motivated to get home.  Discussed need for  improved appetite so core track can be removed and patient will need to work with PT/OT for evaluation.  Patient motivated to do this.  Going to start appetite stimulant to assist with care which patient and daughter agree with; did inform this is not a "fix" for patient's underlying medical conditions and may not improve patient's appetite.  They acknowledged this.     Code Status: Limited: Do not attempt resuscitation (DNR) -DNR-LIMITED -Do Not Intubate/DNI    # Symptom Management  -Appetite, poor; in setting of poor dentition   -Encouraged patient to eat food as desired.  Patient already on softer diet due to poor dentition with nerve root exposure.  Encouraged daughter to bring in food if patient requesting something specific, like a type of yogurt she eats at home.   -Start Marinol 2.5 mg with lunch and dinner.  May need to increase based on patient's symptom response.  #Psycho-social/Spiritual Support:  - Support System: daughter; patient's son is deceased    # Discharge Planning:  To Be Determined  Discussed with: patient, hospitalist, RN, patient's daughter   Thank you for allowing the palliative care team to participate in the care [name].  Alvester Morin, DO Palliative Care Provider PMT # [phone]  If patient remains symptomatic despite maximum doses, please call PMT at [phone] between 0700 and 1900. Outside of these hours, please call attending, as PMT does not have night coverage.  *Please note that this is a verbal dictation therefore any spelling or grammatical errors are due to the "[employer]" system interpretation.

## 2023-02-24 NOTE — Progress Notes (Signed)
HOSPITALIST ROUNDING NOTE Anita Price YNW:[id number]  DOB: March 20, 1956  DOA: 02/13/2023  PCP: Jackie Plum, MD  02/24/2023,5:24 PM   LOS: 11 days      Code Status: DNR From: Home  current Dispo: Unclear     67 year old white female Chronic hepatitis C which has been untreated in the past--underlying cirrhosis, tobacco abuse Chronic pain syndrome due to chronic back pain--- underlying cervical radiculopathy status post C3-C6 ACDF Dr. Yevette Edwards--- follows with pain clinic in [place] is debating spinal cord stimulator trial Degenerative joint disease HTN Previous thyroid nodule  Presented to emergency room 02/13/2019 for generalized weakness anorexia emesis-she was confused disoriented to time and date Workup revealed large loculated left pleural effusion endometrial thickening splenic infarcts  11/14: Admitted on antibiotics, thoracentesis with purulent material 11/15: Pulmonology consulted, chest tube inserted 11/16: Breathing worse; Up to 35L HFNC; Lytics given 11/17: IR consulted for second chest tube 11/18: Second chest tube placed 11/19: Dobbhoff Cortrak placed--- palliative medicine consulted additionally for goals of care--remains full code   Significant studies: 11/14 CT head: NAD 11/14 CT chest: large left pleural effusion 11/15 Echo: Normal EF, normal valves 11/18 CT chest repeat moderate large volume left pleural effusion 20 cm greatest dimension, left base pigtail catheter drainage subscribe pragmatic right basilar nodular pulmonary opacities multifocal pneumonia 11/22 CT chest = substantial improvement left empyema lateral component completely evacuated residue 2 loculations 2.8 X3.9, 2.4 X3.1 11/22 CT abdomen pelvis cirrhosis liver portal hypertension gastrorenal shunt distal varices moderate ascites anasarca-probable splenic infarct?  Laceration seems stable from prior with no extravasation?  Subsegmental pulmonary embolus posterior basilar right lower lobe 11/20 repeat  CT chest with contrast shows right lower lobe segmental and subsegmental emboli no heart strain loculated effusion left with pleural thickening left lower lobe atelectasis small right pleural effusion with patchy airspace disease and cirrhosis?  Splenic infarct again seen   Significant microbiology data: 11/14 Bcx x1: NGTD 11/14 pleural fluid: Strep intermedius   Procedures: 11/14: Thoracentesis 11/15: Chest tube insertion 11/16: Pleural lytics 11/22: Lower right-sided chest tube removed-currently leaking however about 400 cc 11/23: all chest tubes removed 11/23: abd paracentesis done 450 cc   Plan  Strep intermedius sepsis with empyema -Pleural fluid cultures =strep intermedius-blood cultures on admission NGTD large loculated effusion + chest tubes as above Thrombocytopenia 2/2 Untreated hep C with cirrhosis Switch ancef to cefadroxil 500 bid--would complete ~ total 3 weeks EOT 03/05/23 Pulm s/o as below  Leaking chest tube site LLL Defer to CCM--suture was placed by CCM , some leakge 11/23 + 11/24 but has slowed--watch area---seems dry today  Right sided PE Subsegmental PE additionally on CT scan No procedures planned --heparin--> Elliquis for at least 6 mo [relatively immobile] Echocardiogram done recently -- hemodynamically stable --hold repeat  Abd pain, splenic infarct ascites + distention--SAAG < 1.1---in determinant if 2/2 portal htn--paracetesis done as above Indices do not suggest SBP  Abx for Empyema alone  diarr D/w Dr. [name] ? 2/2 feeds-discontinue C. difficile precautions,--Start Imodium scheduled 4mg  qid and observe if slows--remove flexiseal as able  Acute metabolic encephalopathy secondary to sepsis---much improved Unlikely hepatic encephalopathy-initially lactulose, Xifaxan but held 2/2 diarrhea---  Severe protein energy malnutrition on dysphagia diet Trial with core track --encourage diet---calorie count -=nocturnal feeds--marinol added 11/15 Stop  IV fluids 11/22  Electrolyte abnormalities, metabolic acidosis--acidosis is improved Hyponatremia intermittent-periodic lab checks--limit free water and fluid restrict 11/24--improved Hypokalemia replaced Albumin is very low corrected calcium normal  Impaired glucose tolerance likely secondary to feeds-no workup  unless above 180  Chronic pain syndrome from ACDF follows with pain clinic Continues on  MS IR 7.5 every 4 as needed--- home dose 30 every 8, hold high doses for now Stopped--Lyrica 300 twice daily, Flexeril 10 at bedtime and MS Contin 15 every 12  Degenerative joint disease NOS  Endometrial thickening Needs outpatient characterization not emergently  Thyroid nodule Needs outpatient characterization not emergently  Discussed with daughter in person 11/24  DVT prophylaxis: SCD  Status is: Inpatient Remains inpatient appropriate because:   Mentation much improved Remaining issues are poor appetite, draining L side where pleurx was placed and dispo--likely can d/c if eating enough in the next several days-- she will need SNF Can triage to floor if stable and bed needed    Subjective:  Less L sided leakage Much more alert coherent In nad Eating some--calorie count in process No pain fever   Objective + exam Vitals:   02/24/23 0700 02/24/23 0800 02/24/23 0932 02/24/23 1424  BP: (!) 117/55 (!) 106/58 108/69 112/75  Pulse: 84 86 83 82  Resp: 20 16 16 16   Temp:  (!) 97.5 F (36.4 C) 97.8 F (36.6 C) 98.3 F (36.8 C)  TempSrc:  Oral Oral Oral  SpO2: 93% 94% 94% 96%  Weight:      Height:       Filed Weights   02/22/23 0500 02/23/23 0638 02/24/23 0535  Weight: 53.6 kg 52.1 kg 53.4 kg    Examination:  EOMI NCAT no focal deficit cachectic Chest tubes out--L side no real leakage into urostomy bag over the stoma S1-S2 normal-on monitors seems to be in sinus  Abdomen no rebound, slight tender on deep palpation--no rebound Neuro is intact no focal  deficit--no asterixis    Data Reviewed: reviewed   CBC    Component Value Date/Time   WBC 9.6 02/24/2023 0310   RBC 3.40 (L) 02/24/2023 0310   HGB 10.6 (L) 02/24/2023 0310   HCT 31.8 (L) 02/24/2023 0310   PLT 122 (L) 02/24/2023 0310   MCV 93.5 02/24/2023 0310   MCH 31.2 02/24/2023 0310   MCHC 33.3 02/24/2023 0310   RDW 18.2 (H) 02/24/2023 0310   LYMPHSABS 1.8 02/18/2023 0307   MONOABS 0.7 02/18/2023 0307   EOSABS 0.2 02/18/2023 0307   BASOSABS 0.1 02/18/2023 0307      Latest Ref Rng & Units 02/24/2023    8:09 AM 02/23/2023    3:05 AM 02/22/2023    9:27 AM  CMP  Glucose 70 - 99 mg/dL 098  119  147   BUN 8 - 23 mg/dL 14  16  17    Creatinine 0.44 - 1.00 mg/dL 8.29  5.62  1.30   Sodium 135 - 145 mmol/L 130  127  130   Potassium 3.5 - 5.1 mmol/L 3.3  3.5  3.7   Chloride 98 - 111 mmol/L 102  103  104   CO2 22 - 32 mmol/L 21  20  21    Calcium 8.9 - 10.3 mg/dL 7.4  7.0  7.0   Total Protein 6.5 - 8.1 g/dL  5.3  5.4   Total Bilirubin <1.2 mg/dL  0.6  0.7   Alkaline Phos 38 - 126 U/L  131  129   AST 15 - 41 U/L  60  78   ALT 0 - 44 U/L  15  20      Scheduled Meds:  carvedilol  3.125 mg Per NG tube BID WC   Chlorhexidine Gluconate Cloth  6 each Topical Daily   dronabinol  2.5 mg Oral BID AC   feeding supplement  1 Container Oral TID BM   feeding supplement (OSMOLITE 1.5 CAL)  360 mL Per Tube Q24H   [START ON 02/25/2023] feeding supplement (PROSource TF20)  60 mL Per Tube BID   loperamide  4 mg Oral QID   mouth rinse  15 mL Mouth Rinse 4 times per day   pantoprazole (PROTONIX) IV  40 mg Intravenous Q24H   spironolactone  50 mg Oral Daily   Continuous Infusions:   ceFAZolin (ANCEF) IV 2 g (02/24/23 1559)   heparin 1,050 Units/hr (02/24/23 0800)    Time  35  Rhetta Mura, MD  [facility]

## 2023-02-25 ENCOUNTER — Other Ambulatory Visit (HOSPITAL_COMMUNITY): Payer: Self-pay

## 2023-02-25 DIAGNOSIS — A419 Sepsis, unspecified organism: Secondary | ICD-10-CM | POA: Diagnosis not present

## 2023-02-25 LAB — GLUCOSE, CAPILLARY
Glucose-Capillary: 106 mg/dL — ABNORMAL HIGH (ref 70–99)
Glucose-Capillary: 118 mg/dL — ABNORMAL HIGH (ref 70–99)
Glucose-Capillary: 121 mg/dL — ABNORMAL HIGH (ref 70–99)
Glucose-Capillary: 127 mg/dL — ABNORMAL HIGH (ref 70–99)
Glucose-Capillary: 135 mg/dL — ABNORMAL HIGH (ref 70–99)
Glucose-Capillary: 86 mg/dL (ref 70–99)
Glucose-Capillary: 93 mg/dL (ref 70–99)

## 2023-02-25 LAB — COMPREHENSIVE METABOLIC PANEL
ALT: 12 U/L (ref 0–44)
AST: 45 U/L — ABNORMAL HIGH (ref 15–41)
Albumin: 1.8 g/dL — ABNORMAL LOW (ref 3.5–5.0)
Alkaline Phosphatase: 135 U/L — ABNORMAL HIGH (ref 38–126)
Anion gap: 5 (ref 5–15)
BUN: 11 mg/dL (ref 8–23)
CO2: 23 mmol/L (ref 22–32)
Calcium: 7.4 mg/dL — ABNORMAL LOW (ref 8.9–10.3)
Chloride: 102 mmol/L (ref 98–111)
Creatinine, Ser: 0.43 mg/dL — ABNORMAL LOW (ref 0.44–1.00)
GFR, Estimated: >60 mL/min (ref >60)
Glucose, Bld: 92 mg/dL (ref 70–99)
Potassium: 3.8 mmol/L (ref 3.5–5.1)
Sodium: 130 mmol/L — ABNORMAL LOW (ref 135–145)
Total Bilirubin: 0.6 mg/dL (ref <1.2)
Total Protein: 5.5 g/dL — ABNORMAL LOW (ref 6.5–8.1)

## 2023-02-25 LAB — CBC
HCT: 30.1 % — ABNORMAL LOW (ref 36.0–46.0)
Hemoglobin: 10.1 g/dL — ABNORMAL LOW (ref 12.0–15.0)
MCH: 31 pg (ref 26.0–34.0)
MCHC: 33.6 g/dL (ref 30.0–36.0)
MCV: 92.3 fL (ref 80.0–100.0)
Platelets: 145 10*3/uL — ABNORMAL LOW (ref 150–400)
RBC: 3.26 MIL/uL — ABNORMAL LOW (ref 3.87–5.11)
RDW: 19 % — ABNORMAL HIGH (ref 11.5–15.5)
WBC: 7.4 10*3/uL (ref 4.0–10.5)
nRBC: 0 % (ref 0.0–0.2)

## 2023-02-25 MED ORDER — INFLUENZA VAC A&B SURF ANT ADJ 0.5 ML IM SUSY
0.5000 mL | PREFILLED_SYRINGE | INTRAMUSCULAR | Status: DC
  Filled 2023-02-25: qty 0.5

## 2023-02-25 MED ORDER — PANCRELIPASE (LIP-PROT-AMYL) 10440-39150 UNITS PO TABS
20880.0000 [IU] | ORAL_TABLET | Freq: Once | ORAL | Status: AC
Start: 2023-02-25 — End: 2023-02-25
  Administered 2023-02-25: 20880 [IU]
  Filled 2023-02-25: qty 2

## 2023-02-25 MED ORDER — SODIUM BICARBONATE 650 MG PO TABS
650.0000 mg | ORAL_TABLET | Freq: Once | ORAL | Status: AC
Start: 2023-02-25 — End: 2023-02-25
  Administered 2023-02-25: 650 mg
  Filled 2023-02-25: qty 1

## 2023-02-25 MED ORDER — PANCRELIPASE (LIP-PROT-AMYL) 10440-39150 UNITS PO TABS
20880.0000 [IU] | ORAL_TABLET | Freq: Once | ORAL | Status: AC
  Administered 2023-02-25: 20880 [IU]
  Filled 2023-02-25: qty 2

## 2023-02-25 MED ORDER — SODIUM BICARBONATE 650 MG PO TABS
650.0000 mg | ORAL_TABLET | Freq: Once | ORAL | Status: AC
  Administered 2023-02-25: 650 mg
  Filled 2023-02-25: qty 1

## 2023-02-25 MED ORDER — OSMOLITE 1.5 CAL PO LIQD
480.0000 mL | ORAL | Status: DC
  Administered 2023-02-25 – 2023-02-26 (×2): 480 mL
  Filled 2023-02-25 (×4): qty 711

## 2023-02-25 NOTE — Progress Notes (Signed)
Nutrition Follow-up  DOCUMENTATION CODES:   Non-severe (moderate) malnutrition in context of chronic illness  INTERVENTION:   - 48 hour calorie count to continue  and run through tonight 11/26 after dinner.             - Please document % intake of all foods, drinks, and nutrition supplements patient consumes on meal tickets and place in envelope on patient's door.             - If patient skips/refuses meals please document 0% for that meal.  - Boost Breeze po TID, each supplement provides 250 kcal and 9 grams of protein - Encourage intake at all meals and of supplements.  - Marinol for appetite stimulant.   - Continue nocturnal TF  - Tonight: Osmolite 1.5 at 47mL/hr for 12 hours overnight (1800-0600) + ProSource Plus BID - Provides: 880 kcals (53% needs), 70g protein (88% needs), and free water  - Continue to monitor magnesium, potassium, and phosphorus, MD to replete as needed.   NUTRITION DIAGNOSIS:   Moderate Malnutrition related to chronic illness as evidenced by moderate fat depletion, severe muscle depletion.  Ongoing  GOAL:   Patient will meet greater than or equal to 90% of their needs  Meeting mostly with tube feeds. PO poor.  MONITOR:   PO intake, Supplement acceptance, Labs, Weight trends, TF tolerance  ASSESSMENT:   67 y.o. F with cirrhosis, HTH, hx HE who presented with vomiting and decreased mentation, found to have empyema and sepsis.  11/14 Admit , s/p thoracentesis 1L yield 11/15 Left lateral chest tube placed 11/18 Left anterior chest tube placed, diet: dysphagia 3 11/19 small bore NGT placed, TF started 11/22 Left lateral chest tube removed 11/23 Calorie count started, not completed 11/25 Calorie Count restarted, TF transitioned to night feeds  Patient received night feed regimen of Osmolite 1.5 @ 30 ml/hr x 12 hours overnight. Also receiving Prosource TF BID.   Calorie Count results from 11/25: B: 0% L: mashed potatoes order but not  documented if eaten D: 60 kcals, 2g protein Supplements: 1 Boost Breeze= 250 kcals, 9g protein Total: 310 kcals (18% of needs), 11g protein (13% of needs)  Admission weight: 103 lbs Current weight: 111 lbs  Medications: Marinol, Viokace, Imodium, Aldactone  Labs reviewed: CBGs: 86-126 Low Na   Diet Order:   Diet Order             DIET DYS 3 Room service appropriate? Yes; Fluid consistency: Thin; Fluid restriction: 1500 mL Fluid  Diet effective now                   EDUCATION NEEDS:   Not appropriate for education at this time  Skin:  Skin Assessment: Skin Integrity Issues: Skin Integrity Issues:: Other (Comment) Other: IAD buttocks  Last BM:  11/26- rectal tube  Height:   Ht Readings from Last 1 Encounters:  02/13/23 5\' 5"  (1.651 m)    Weight:   Wt Readings from Last 1 Encounters:  02/25/23 50.6 kg   BMI:  Body mass index is 18.56 kg/m.  Estimated Nutritional Needs:   Kcal:  1650-1800 kcals  Protein:  80-100 grams  Fluid:  >/= 1.7L   Tilda Franco, MS, RD, LDN Inpatient Clinical Dietitian Contact information available via Amion

## 2023-02-25 NOTE — Progress Notes (Signed)
This nurse is trying to flush his tube as tube feeding is already done at 0600 and it seems its clogged. Flushes was scheduled every 4 hours and it has not beeped all night. Tried to change the 3 way connector but still met with resistance. Tries warm water but still cannot get in. Breath sound of patient is clear. This nurse called ICU to ask for some help and Anita Price will try to order some enzyme to help unclogged the tube.

## 2023-02-25 NOTE — Evaluation (Signed)
Physical Therapy Evaluation Patient Details Name: LAICEY SCITES MRN: [id number] DOB: January 06, 1956 Today's Date: 02/25/2023  History of Present Illness  Patient is a 67 year old female who presented on 11/14 with confusion. Patient was found to have strep intermedius empyema, acute encephalopathy, severe protein calorie malnutrition, and RLL pulmonary emboli. Patient had multiple chest tubes from 11/5 to 11/24.   PMH: Hep C, cirrhosis, chronic pain, anorexia, tobacco use, chronic pain syndrome, ACDF c3-c6  Clinical Impression  Pt admitted with above diagnosis.  Pt currently with functional limitations due to the deficits listed below (see PT Problem List). Pt will benefit from acute skilled PT to increase their independence and safety with mobility to allow discharge.     PT evaluation limited as patient did not want to mobilize  to sitting/OOB. Patient also not providing  information on her  prior level of function. No family present. Patient will benefit from continued inpatient follow up therapy, <3 hours/day       If plan is discharge home, recommend the following: Two people to help with walking and/or transfers;A lot of help with bathing/dressing/bathroom;Assist for transportation;Assistance with cooking/housework;Help with stairs or ramp for entrance   Can travel by private vehicle   No    Equipment Recommendations  (TBD)  Recommendations for Other Services       Functional Status Assessment Patient has had a recent decline in their functional status and demonstrates the ability to make significant improvements in function in a reasonable and predictable amount of time.     Precautions / Restrictions Precautions Precaution Comments: cervical precautions(H/O ACDF), cortrack      Mobility  Bed Mobility               General bed mobility comments: began to  attempt bed  mobility , patient declined to move to sitting.    Transfers                   General  transfer comment: NT, pt declined    Ambulation/Gait                  Stairs            Wheelchair Mobility     Tilt Bed    Modified Rankin (Stroke Patients Only)       Balance       Sitting balance - Comments: NT                                     Pertinent Vitals/Pain Pain Assessment Faces Pain Scale: Hurts even more Pain Location: legs, tube in nose Pain Descriptors / Indicators: Discomfort, Grimacing, Guarding Pain Intervention(s): Monitored during session    Home Living Family/patient expects to be discharged to:: Private residence Living Arrangements: Children Available Help at Discharge: Family;Available PRN/intermittently Type of Home: Apartment Home Access: Stairs to enter       Home Layout: One level Home Equipment: Agricultural consultant (2 wheels)      Prior Function Prior Level of Function : Independent/Modified Independent             Mobility Comments: patient limited in providing iformation about PLOF       Extremity/Trunk Assessment   Upper Extremity Assessment Upper Extremity Assessment: Generalized weakness    Lower Extremity Assessment Lower Extremity Assessment: Generalized weakness    Cervical / Trunk Assessment Cervical / Trunk Assessment: Normal  Communication   Communication Communication: No apparent difficulties  Cognition Arousal: Alert Behavior During Therapy: Flat affect Overall Cognitive Status: No family/caregiver present to determine baseline cognitive functioning                                 General Comments: patient limited in answering  orientation, frequently asked  for me to call her dtr.        General Comments      Exercises     Assessment/Plan    PT Assessment Patient needs continued PT services  PT Problem List Decreased strength;Decreased mobility;Decreased activity tolerance;Pain       PT Treatment Interventions DME instruction;Therapeutic  activities;Gait training;Therapeutic exercise;Functional mobility training;Patient/family education    PT Goals (Current goals can be found in the Care Plan section)  Acute Rehab PT Goals Patient Stated Goal: to go home soon PT Goal Formulation: With patient Time For Goal Achievement: 03/11/23 Potential to Achieve Goals: Fair    Frequency Min 1X/week     Co-evaluation               AM-PAC PT "6 Clicks" Mobility  Outcome Measure Help needed turning from your back to your side while in a flat bed without using bedrails?: A Lot Help needed moving from lying on your back to sitting on the side of a flat bed without using bedrails?: Total Help needed moving to and from a bed to a chair (including a wheelchair)?: Total Help needed standing up from a chair using your arms (e.g., wheelchair or bedside chair)?: Total Help needed to walk in hospital room?: Total Help needed climbing 3-5 steps with a railing? : Total 6 Click Score: 7    End of Session   Activity Tolerance: Patient limited by fatigue Patient left: in bed;with bed alarm set Nurse Communication: Mobility status PT Visit Diagnosis: Muscle weakness (generalized) (M62.81);Difficulty in walking, not elsewhere classified (R26.2)    Time: 1610-9604 PT Time Calculation (min) (ACUTE ONLY): 19 min   Charges:   PT Evaluation $PT Eval Low Complexity: 1 Low   PT General Charges $$ ACUTE PT VISIT: 1 Visit         Blanchard Kelch PT Acute Rehabilitation Services Office [phone] Weekend [phone]   Rada Hay 02/25/2023, 4:17 PM

## 2023-02-25 NOTE — Progress Notes (Signed)
HOSPITALIST ROUNDING NOTE Anita Price ZOX:[id number]  DOB: March 02, 1956  DOA: 02/13/2023  PCP: Jackie Plum, MD  02/25/2023,3:50 PM   LOS: 12 days      Code Status: DNR From: Home  current Dispo: Unclear     67 year old white female Chronic hepatitis C which has been untreated in the past--underlying cirrhosis, tobacco abuse Chronic pain syndrome due to chronic back pain--- underlying cervical radiculopathy status post C3-C6 ACDF Dr. Yevette Edwards--- follows with pain clinic in [place] is debating spinal cord stimulator trial Degenerative joint disease HTN Previous thyroid nodule  Presented to emergency room 02/13/2019 for generalized weakness anorexia emesis-she was confused disoriented to time and date Workup revealed large loculated left pleural effusion endometrial thickening splenic infarcts  11/14: Admitted on antibiotics, thoracentesis with purulent material 11/15: Pulmonology consulted, chest tube inserted 11/16: Breathing worse; Up to 35L HFNC; Lytics given 11/17: IR consulted for second chest tube 11/18: Second chest tube placed 11/19: Dobbhoff Cortrak placed--- palliative medicine consulted additionally for goals of care--remains full code   Significant studies: 11/14 CT head: NAD 11/14 CT chest: large left pleural effusion 11/15 Echo: Normal EF, normal valves 11/18 CT chest repeat moderate large volume left pleural effusion 20 cm greatest dimension, left base pigtail catheter drainage subscribe pragmatic right basilar nodular pulmonary opacities multifocal pneumonia 11/22 CT chest = substantial improvement left empyema lateral component completely evacuated residue 2 loculations 2.8 X3.9, 2.4 X3.1 11/22 CT abdomen pelvis cirrhosis liver portal hypertension gastrorenal shunt distal varices moderate ascites anasarca-probable splenic infarct?  Laceration seems stable from prior with no extravasation?  Subsegmental pulmonary embolus posterior basilar right lower lobe 11/20 repeat  CT chest with contrast shows right lower lobe segmental and subsegmental emboli no heart strain loculated effusion left with pleural thickening left lower lobe atelectasis small right pleural effusion with patchy airspace disease and cirrhosis?  Splenic infarct again seen   Significant microbiology data: 11/14 Bcx x1: NGTD 11/14 pleural fluid: Strep intermedius   Procedures: 11/14: Thoracentesis 11/15: Chest tube insertion 11/16: Pleural lytics 11/22: Lower right-sided chest tube removed-currently leaking however about 400 cc 11/23: all chest tubes removed 11/23: abd paracentesis done 450 cc   Plan  Strep intermedius sepsis with empyema -Pleural fluid cultures =strep intermedius-blood cultures on admission NGTD large loculated effusion + chest tubes as above Thrombocytopenia 2/2 Untreated hep C with cirrhosis Switch ancef to cefadroxil 500 bid--would complete ~ total 3 weeks EOT 03/05/23 Pulm s/o as below  Leaking chest tube site LLL Defer to CCM--suture was placed by CCM , some leakge 11/23 + 11/24 --- no further leakage-remove small urostomy bag and place reinforce bandage and reassess  Right sided PE Subsegmental PE additionally on CT scan No procedures planned --heparin--> Elliquis for at least 6 mo [relatively immobile] Echocardiogram done recently -- hemodynamically stable --hold repeat of echocardiogram at this time  Abd pain, splenic infarct ascites + distention--SAAG < 1.1---in determinant if 2/2 portal htn--paracetesis done as above Indices do not suggest SBP  Abx for Empyema alone  diarr D/w Dr. [name] ? 2/2 feeds-discontinue C. difficile precautions,--Start Imodium scheduled 4mg  qid and observe if slows--remove flexiseal as able  Acute metabolic encephalopathy secondary to sepsis---much improved Unlikely hepatic encephalopathy-initially lactulose, Xifaxan but held 2/2 diarrhea---  Severe protein energy malnutrition on dysphagia diet Trial with core track  --encourage diet---calorie count -=nocturnal feeds--marinol added 1125 and we will reassess Patient is getting nocturnal feeds but the core track is plugged and not working-this may need to be removed and replaced until we  can get the calorie count that was ordered and results of the same Stop IV fluids 11/22  Electrolyte abnormalities, metabolic acidosis--acidosis is improved Hyponatremia intermittent-periodic lab checks--limit free water and fluid restrict 11/24--improved Hypokalemia replaced Albumin is very low corrected calcium normal  Impaired glucose tolerance likely secondary to feeds-no workup unless above 180  Chronic pain syndrome from ACDF follows with pain clinic Continues on  MS IR 7.5 every 4 as needed--- home dose 30 every 8, hold high doses for now Stopped--Lyrica 300 twice daily, Flexeril 10 at bedtime and MS Contin 15 every 12  Degenerative joint disease NOS  Endometrial thickening Needs outpatient characterization not emergently  Thyroid nodule Needs outpatient characterization not emergently   No family present today-patient will need probable skilled care removal of core track prior to this and will probably be here for the next several days  DVT prophylaxis: SCD  Status is: Inpatient Remains inpatient appropriate because:   Mentation much improved Remaining issues are poor appetite, draining L side where pleurx was placed and dispo--likely can d/c if eating enough in the next several days-- she will need SNF     Subjective:  Much more coherent Eating some Core track was plugged today and is not working despite several trials and may need to be replaced She is unable to keep up with her calorie needs at this time and the hope is to transition to oral feeds and remove the core track in several days No other complaints   Objective + exam Vitals:   02/25/23 0437 02/25/23 0500 02/25/23 0734 02/25/23 1302  BP: 137/75  132/74 132/80  Pulse: 83  80 82   Resp:    16  Temp: 98 F (36.7 C)   98.4 F (36.9 C)  TempSrc: Oral     SpO2: 98%     Weight:  50.6 kg    Height:       Filed Weights   02/23/23 0638 02/24/23 0535 02/25/23 0500  Weight: 52.1 kg 53.4 kg 50.6 kg    Examination:  Awake coherent pleasant no distress Chest is clear decreased air entry posterolaterally on the left side-urostomy bag present but no fluid in Abdomen is soft slightly distended no rebound S1-S2 no murmur Neuro intact moving 4 limbs equally    Data Reviewed: reviewed   CBC    Component Value Date/Time   WBC 7.4 02/25/2023 0454   RBC 3.26 (L) 02/25/2023 0454   HGB 10.1 (L) 02/25/2023 0454   HCT 30.1 (L) 02/25/2023 0454   PLT 145 (L) 02/25/2023 0454   MCV 92.3 02/25/2023 0454   MCH 31.0 02/25/2023 0454   MCHC 33.6 02/25/2023 0454   RDW 19.0 (H) 02/25/2023 0454   LYMPHSABS 1.8 02/18/2023 0307   MONOABS 0.7 02/18/2023 0307   EOSABS 0.2 02/18/2023 0307   BASOSABS 0.1 02/18/2023 0307      Latest Ref Rng & Units 02/25/2023    4:54 AM 02/24/2023    8:09 AM 02/23/2023    3:05 AM  CMP  Glucose 70 - 99 mg/dL 92  657  846   BUN 8 - 23 mg/dL 11  14  16    Creatinine 0.44 - 1.00 mg/dL 9.62  9.52  8.41   Sodium 135 - 145 mmol/L 130  130  127   Potassium 3.5 - 5.1 mmol/L 3.8  3.3  3.5   Chloride 98 - 111 mmol/L 102  102  103   CO2 22 - 32 mmol/L 23  21  20   Calcium 8.9 - 10.3 mg/dL 7.4  7.4  7.0   Total Protein 6.5 - 8.1 g/dL 5.5   5.3   Total Bilirubin <1.2 mg/dL 0.6   0.6   Alkaline Phos 38 - 126 U/L 135   131   AST 15 - 41 U/L 45   60   ALT 0 - 44 U/L 12   15      Scheduled Meds:  apixaban  10 mg Oral BID   Followed by   Melene Muller ON 03/03/2023] apixaban  5 mg Oral BID   carvedilol  3.125 mg Per NG tube BID WC   cefadroxil  500 mg Oral BID   Chlorhexidine Gluconate Cloth  6 each Topical Daily   dronabinol  2.5 mg Oral BID AC   feeding supplement  1 Container Oral TID BM   feeding supplement (OSMOLITE 1.5 CAL)  480 mL Per Tube Q24H    feeding supplement (PROSource TF20)  60 mL Per Tube BID   [START ON 02/26/2023] influenza vaccine adjuvanted  0.5 mL Intramuscular Tomorrow-1000   loperamide  4 mg Oral QID   mouth rinse  15 mL Mouth Rinse 4 times per day   pantoprazole (PROTONIX) IV  40 mg Intravenous Q24H   sodium bicarbonate  650 mg Per Tube Once   spironolactone  50 mg Oral Daily   Continuous Infusions:    Time  25  Rhetta Mura, MD  [facility]

## 2023-02-25 NOTE — TOC Progression Note (Addendum)
Transition of Care [facility]) - Progression Note    Patient Details  Name: Anita Price MRN: [id number] Date of Birth: 01-Oct-1955  [facility]) CM/SW Contact  Darleene Cleaver, [place] Phone Number: 02/25/2023, 1:23 PM  Clinical Narrative:     CSW received message that patient needs assistance for her Eliquis.  CSW referred her to pharmacy who can provide free 30 day coupon.  Patient will then have to enroll in their program to help with reduced cost of medications.  TOC continuing to follow for any other TOC needs.  Expected Discharge Plan: Home/Self Care Barriers to Discharge: Continued Medical Work up  Expected Discharge Plan and Services In-house Referral: Clinical Social Work   Post Acute Care Choice: Home Health Living arrangements for the past 2 months: Apartment                                       Social Determinants of Health (SDOH) Interventions SDOH Screenings   Food Insecurity: No Food Insecurity (02/14/2023)  Housing: Low Risk  (02/14/2023)  Transportation Needs: No Transportation Needs (02/14/2023)  Utilities: Not At Risk (02/14/2023)  Social Connections: Unknown (08/01/2021)   Received from [facility]  Tobacco Use: High Risk (02/17/2023)    Readmission Risk Interventions    02/16/2023    3:49 PM 07/13/2020   11:39 AM  Readmission Risk Prevention Plan  Post Dischage Appt  Not Complete  Appt Comments  attempted to call PCP to schedule f/u- however office informed us that patient would need to call for appointment  Medication Screening  Complete  Transportation Screening Not Complete Complete  Transportation Screening Comment Patient only alert and orieted x1, unable to complete transportation screening.   PCP or Specialist Appt within 5-7 Days Not Complete   Not Complete comments Patient only alert and oriented x1 can't schedule an appt.   Home Care Screening Not Complete   Home Care Screening Not Completed Comments Alert and oriented  x1 and not medically ready for HH.   Medication Review (RN CM) Referral to Pharmacy

## 2023-02-25 NOTE — Progress Notes (Signed)
  Daily Progress Note   Patient Name: Anita Price       Date: 02/25/2023 DOB: 01/15/1956  Age: 67 y.o. MRN#: [id number] Attending Physician: Rhetta Mura, MD Primary Care Physician: Jackie Plum, MD Admit Date: 02/13/2023 Length of Stay: 12 days  Reason for Consultation/Follow-up: Establishing goals of care  Subjective:   CC: Patient sitting up in bed noting she is feeling "better". Following up regarding complex medical decision making.   Subjective:  Reviewed EMR prior to presenting to bedside.    Patient still does not have an appetite at this time.    Discussed next steps would include being able to eat enough calories to maintain her needs.  Need to get cortrack tube removed.  Also need to have patient work with physical therapy to regain strength.  Patient motivated to work with physical therapy at this time.      Vital Signs:  BP 132/80 (BP Location: Left Wrist)   Pulse 82   Temp 98.4 F (36.9 C)   Resp 16   Ht 5\' 5"  (1.651 m)   Wt 50.6 kg   SpO2 98%   BMI 18.56 kg/m   Physical Exam: General: NAD, chronically ill appearing, cachectic, frail  HENT: poor dentition, Cortrack in place Cardiovascular: RRR Respiratory: not in respiratory distress, increased work of breathing noted  Neuro: Interactive, appropriately answering questions, pleasant  Imaging: I personally reviewed recent imaging.   Assessment & Plan:   Assessment: Patient is a 67 year old female with a past medical history of cirrhosis in the setting of untreated hepatitis C, uncomplicated opioid dependence, protein calorie malnutrition, and hypertension who was admitted on 02/13/2023 for management of vomiting and decreased mentation.  During admission patient was found to have empyema and sepsis secondary to Streptococcus.  Patient has received management for empyema including antibiotics and chest tube placement.  Patient not a candidate for VATS.  Pulmonology following recommendations.   Patient also receiving management for AKI and encephalopathy.  Palliative medicine team consulted to assist with complex medical decision making.   Recommendations/Plan: # Complex medical decision making/goals of care:  -now on appetite stimulant, calorie count underway, encourage PO.      Code Status: Limited: Do not attempt resuscitation (DNR) -DNR-LIMITED -Do Not Intubate/DNI    # Symptom Management  -Appetite, poor; in setting of poor dentition   -Encouraged patient to eat food as desired.  Patient already on softer diet due to poor dentition with nerve root exposure.      -Start Marinol 2.5 mg with lunch and dinner.  May need to increase based on patient's symptom response.  #Psycho-social/Spiritual Support:  - Support System: daughter; patient's son is deceased    # Discharge Planning:  To Be Determined  Discussed with: patient   Thank you for allowing the palliative care team to participate in the care [name].  Low MDM Rosalin Hawking MD Palliative Care Provider PMT # [phone]  If patient remains symptomatic despite maximum doses, please call PMT at [phone] between 0700 and 1900. Outside of these hours, please call attending, as PMT does not have night coverage.  *Please note that this is a verbal dictation therefore any spelling or grammatical errors are due to the "[employer]agon [employer] One" system interpretation.

## 2023-02-25 NOTE — Discharge Instructions (Addendum)
Information on my medicine - ELIQUIS (apixaban)   Why was Eliquis prescribed for you? Eliquis was prescribed to treat blood clots that may have been found in the veins of your legs (deep vein thrombosis) or in your lungs (pulmonary embolism) and to reduce the risk of them occurring again.  What do You need to know about Eliquis ? The starting dose is 10 mg (two 5 mg tablets) taken TWICE daily for the FIRST SEVEN (7) DAYS, then on 03/03/2023  the dose is reduced to ONE 5 mg tablet taken TWICE daily.  Eliquis may be taken with or without food.   Try to take the dose about the same time in the morning and in the evening. If you have difficulty swallowing the tablet whole please discuss with your pharmacist how to take the medication safely.  Take Eliquis exactly as prescribed and DO NOT stop taking Eliquis without talking to the doctor who prescribed the medication.  Stopping may increase your risk of developing a new blood clot.  Refill your prescription before you run out.  After discharge, you should have regular check-up appointments with your healthcare provider that is prescribing your Eliquis.    What do you do if you miss a dose? If a dose of ELIQUIS is not taken at the scheduled time, take it as soon as possible on the same day and twice-daily administration should be resumed. The dose should not be doubled to make up for a missed dose.  Important Safety Information A possible side effect of Eliquis is bleeding. You should call your healthcare provider right away if you experience any of the following: Bleeding from an injury or your nose that does not stop. Unusual colored urine (red or dark brown) or unusual colored stools (red or black). Unusual bruising for unknown reasons. A serious fall or if you hit your head (even if there is no bleeding).  Some medicines may interact with Eliquis and might increase your risk of bleeding or clotting while on Eliquis. To help avoid  this, consult your healthcare provider or pharmacist prior to using any new prescription or non-prescription medications, including herbals, vitamins, non-steroidal anti-inflammatory drugs (NSAIDs) and supplements.  This website has more information on Eliquis (apixaban): http://www.eliquis.com/eliquis/home Eliquis 360 Support: (757)669-0212 for discount and patient assistance information.

## 2023-02-26 DIAGNOSIS — A419 Sepsis, unspecified organism: Secondary | ICD-10-CM | POA: Diagnosis not present

## 2023-02-26 LAB — GLUCOSE, CAPILLARY
Glucose-Capillary: 115 mg/dL — ABNORMAL HIGH (ref 70–99)
Glucose-Capillary: 121 mg/dL — ABNORMAL HIGH (ref 70–99)
Glucose-Capillary: 144 mg/dL — ABNORMAL HIGH (ref 70–99)
Glucose-Capillary: 88 mg/dL (ref 70–99)
Glucose-Capillary: 92 mg/dL (ref 70–99)
Glucose-Capillary: 96 mg/dL (ref 70–99)

## 2023-02-26 MED ORDER — ORAL CARE MOUTH RINSE: 15.0000 mL | OROMUCOSAL | Status: DC

## 2023-02-26 MED ORDER — CEFADROXIL 500 MG PO CAPS
1000.0000 mg | ORAL_CAPSULE | Freq: Two times a day (BID) | ORAL | Status: DC
  Administered 2023-02-27 – 2023-03-06 (×15): 1000 mg via ORAL
  Filled 2023-02-26 (×16): qty 2

## 2023-02-26 MED ORDER — ORAL CARE MOUTH RINSE: 15.0000 mL | OROMUCOSAL | Status: DC | PRN

## 2023-02-26 MED ORDER — PROSOURCE PLUS PO LIQD
30.0000 mL | Freq: Three times a day (TID) | ORAL | Status: DC
  Administered 2023-02-26 – 2023-02-28 (×5): 30 mL via ORAL
  Filled 2023-02-26 (×13): qty 30

## 2023-02-26 NOTE — TOC Progression Note (Signed)
Transition of Care [facility]) - Progression Note    Patient Details  Name: Anita Price MRN: [id number] Date of Birth: [birth year]  [facility]) CM/SW Contact  Christianne Zacher, Olegario Messier, RN Phone Number: 02/26/2023, 10:03 AM  Clinical Narrative: Noted PT recc-attempted to contact dtr Melisa-left vm, Noted has NGT,Rectal tube-once these are d/c can be appropriate for exploring ST SNF-w. Noted opiod dependence. Will continue to follow, & discuss if patient/dtr in agreement to ST SNF.      Expected Discharge Plan:  (TBD) Barriers to Discharge: Continued Medical Work up  Expected Discharge Plan and Services In-house Referral: Clinical Social Work   Post Acute Care Choice: Home Health Living arrangements for the past 2 months: Apartment                                       Social Determinants of Health (SDOH) Interventions SDOH Screenings   Food Insecurity: No Food Insecurity (02/14/2023)  Housing: Low Risk  (02/14/2023)  Transportation Needs: No Transportation Needs (02/14/2023)  Utilities: Not At Risk (02/14/2023)  Social Connections: Unknown (08/01/2021)   Received from [facility]  Tobacco Use: High Risk (02/17/2023)    Readmission Risk Interventions    02/16/2023    3:49 PM 07/13/2020   11:39 AM  Readmission Risk Prevention Plan  Post Dischage Appt  Not Complete  Appt Comments  attempted to call PCP to schedule f/u- however office informed us that patient would need to call for appointment  Medication Screening  Complete  Transportation Screening Not Complete Complete  Transportation Screening Comment Patient only alert and orieted x1, unable to complete transportation screening.   PCP or Specialist Appt within 5-7 Days Not Complete   Not Complete comments Patient only alert and oriented x1 can't schedule an appt.   Home Care Screening Not Complete   Home Care Screening Not Completed Comments Alert and oriented x1 and not medically ready for HH.   Medication  Review (RN CM) Referral to Pharmacy

## 2023-02-26 NOTE — Progress Notes (Signed)
Calorie Count Note  48 hour calorie count ordered.  Diet: Dysphagia 3 diet Supplements:  -Boost Breeze po TID, each supplement provides 250 kcal and 9 grams of protein   11/26: Breakfast: 200 kcals, 3g protein Lunch: 150 kcals, 2g protein Dinner: 245 kcals, 16g protein Additional: 1 Sundrop soda =250 kcals Supplements: 2 Boost Breeze = 500 kcals, 18g protein  Total intake: 1345 kcal (81% of minimum estimated needs)  39g protein (48% of minimum estimated needs)  Nutrition Dx: Moderate Malnutrition related to chronic illness as evidenced by moderate fat depletion, severe muscle depletion   Goal: Pt to meet >/= 90% of their estimated nutrition needs    Intervention:  -Continue Boost Breeze po TID, each supplement provides 250 kcal and 9 grams of protein  -Provide Prosource Plus PO TID, each provides 100 kcals and 15g protein -If intakes remain this way, can likely d/c NGT. Pt will need to take her supplements in order to meet protein needs.  Tilda Franco, MS, RD, LDN Inpatient Clinical Dietitian Contact information available via Amion

## 2023-02-26 NOTE — Progress Notes (Signed)
PROGRESS NOTE Anita Price  ZOX:[id number] DOB: 09-Jan-1956 DOA: 02/13/2023 PCP: Jackie Plum, MD  Brief Narrative/Hospital Course: 67 year old female with chronic medical history of Chronic hepatitis C/liver cirrhosis which has been untreated in the past--underlying cirrhosis, tobacco abuse,  Chronic pain syndrome due to chronic back pain--- underlying cervical radiculopathy status post C3-C6 ACDF Dr. Yevette Edwards--- follows with pain clinic in [place] is debating spinal cord stimulator trial, Degenerative joint disease, HTN,previous thyroid nodule, brought to the ED 11/14 for generalized weakness, anorexia nausea and vomiting multiple times x 3 to 4 days PTA. In the ED arousable but confused workup revealed large loculated left pleural effusion endometrial thickening splenic infarcts Patient was admitted on antibiotics underwent thoracentesis pulmonary IR consult with prolonged hospitalization and multiple procedure as below:   11/14: Admitted on antibiotics, thoracentesis with purulent material 11/15: Pulmonology consulted, chest tube inserted 11/16: Breathing worse; Up to 35L HFNC; Pleural Lytics given 11/18: Second chest tube placed by IR 11/19: Dobbhoff Cortrak placed--- palliative medicine consulted additionally for goals of care--remains full code. 11/22: Lower right-sided chest tube removed 11/23: all chest tubes removed 11/23: abd paracentesis done 450 cc   Significant studies: 11/14 CT head: NAD 11/14 CT chest: large left pleural effusion 11/15 Echo: Normal EF, normal valves 11/18 CT chest repeat moderate large volume left pleural effusion 20 cm greatest dimension, left base pigtail catheter drainage subscribe pragmatic right basilar nodular pulmonary opacities multifocal pneumonia 11/22 CT chest = substantial improvement left empyema lateral component completely evacuated residue 2 loculations 2.8 X3.9, 2.4 X3.1 11/22 CT abdomen pelvis cirrhosis liver portal hypertension  gastrorenal shunt distal varices moderate ascites anasarca-probable splenic infarct?  Laceration seems stable from prior with no extravasation?  Subsegmental pulmonary embolus posterior basilar right lower lobe 11/20 repeat CT chest with contrast shows right lower lobe segmental and subsegmental emboli no heart strain loculated effusion left with pleural thickening left lower lobe atelectasis small right pleural effusion with patchy airspace disease and cirrhosis?  Splenic infarct again seen   Significant microbiology data: 11/14 Bcx x1: NGTD 11/14 pleural fluid: Strep intermedius       Subjective: Seen and examined this morning she is alert awake oriented to self, place and year No asterixis. NGT ,FMS in place Overnight patient had been afebrile, not hypoxic This morning with blood sugar 92 CBC CMP reviewed from 11/26-improving thrombocytopenia normal LFTs albumin 1.8 and Na at 130  Assessment and Plan: Principal Problem:   Sepsis due to Streptococcus intermedius empyema (HCC) Active Problems:   Empyema (HCC)   Acute metabolic encephalopathy   Essential hypertension   Tobacco use   Hyponatremia   Prolonged QT interval   Hepatic cirrhosis in setting of untreated hepatitis C    AKI (acute kidney injury) (HCC)   Abnormal ultrasound of uterus   Hypokalemia   Uncomplicated opioid dependence (HCC)   Thrombocytopenia (HCC)   Hypophosphatemia   Lactic acidosis   Malnutrition of moderate degree   Palliative care encounter   Goals of care, counseling/discussion   Counseling and coordination of care   Need for emotional support   Sepsis (HCC)   DNR (do not resuscitate) discussion   Poor appetite   Medication management   Streptococcus intermedius empyema on left Leaking chest tube site LLL Sepsis POA due to empyema: Patient underwent multiple procedures including thoracentesis 11/14 with milky white output left lower chest tube initially 11/15 please give endolitis additional  chest tube placed 11/18, left lower chest tube removed 11/22 and left anterior chest tube in  11/24.  Pulmonary has signed, has this morning fluid pericardial left effusion and not a candidate for lytics, will likely self resolve or a scar down.  Initially on cefazolin> changed to cefadroxil 11/24, needs total 4 weeks course per PCCM- EOT 03/05/23. Continue supplemental oxygen, incentive spirometry PT OT and monitor left lateral chest tube drain site. Recent Labs  Lab 02/21/23 0316 02/22/23 0927 02/23/23 0305 02/24/23 0310 02/25/23 0454  WBC 14.7* 9.9 10.0 9.6 7.4    RLL pulmonary emboli: Continue anticoagulation with Eliquis-loading and maintenance dose per pharmacy.  Acute metabolic encephalopathy History hepatoencephalopathy: Multifactorial due to sepsis, on chronic opiates.Unlikely hepatic encephalopathy.  No asterixis noted. Last BM 11/24, resume rifaximin soon .Avoid constipation   Diarrhea: On Imodium scheduled ID was consulted and discussed.Likely vit G feeding.Discussed with nursing staff hopefully remove Flexi-Seal today.  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  On antibiotics.  Monitor, continue on Aldactone, Coreg.  Metabolic acidosis:improved Hyponatremia intermittent-periodic lab checks--limit free water and fluid restrict 11/24--improved Hypokalemia replaced Hypoalbuminemia:Augment nutrition   Impaired glucose tolerance likely secondary to feeds-no workup unless above 180   Chronic pain syndrome from ACDF : Follows with pain clinic.  Continue on pain control MS IR 7.5 mg q 4 hr prn-Home dose 30 every 8, hold high doses for now Stopped--Lyrica , Flexeril 10 at bedtime and MS Contin 15 every 12   Degenerative joint disease NOS   Endometrial thickening Needs outpatient  characterization not emergently   Thyroid nodule Needs outpatient characterization not emergently  Failure to thrive/debility/deconditioning Palliative care already following.  Continue core track diet Overall prognosis does not appear bright.  Moderate malnutrition: Completing calorie count today. Nutrition Status: Nutrition Problem: Moderate Malnutrition Etiology: chronic illness Signs/Symptoms: moderate fat depletion, severe muscle depletion Interventions: Refer to RD note for recommendations, Boost Breeze, Tube feeding  DVT prophylaxis: SCDs Start: 02/13/23 1735 Eliquis Code Status:   Code Status: Limited: Do not attempt resuscitation (DNR) -DNR-LIMITED -Do Not Intubate/DNI  Family Communication: plan of care discussed with patient/NO FAMILY at bedside. Patient status is: Inpatient because of encephalopathy and malnutrition Level of care: Progressive   Dispo: The patient is from: HOME, with daughter            Anticipated disposition: Skilled nursing facility once stable Objective: Vitals last 24 hrs: Vitals:   02/26/23 0409 02/26/23 0458 02/26/23 0500 02/26/23 1000  BP: 129/83   136/87  Pulse: 91     Resp: (!) 28 18    Temp: 97.8 F (36.6 C)   98.2 F (36.8 C)  TempSrc: Oral   Oral  SpO2: 97%   98%  Weight:   48.4 kg   Height:       Weight change: -2.2 kg  Physical Examination: General exam: alert awake, oriented x 2-3, ill-appearing HEENT:Oral mucosa moist, Ear/Nose WNL grossly Respiratory system: Bilaterally clear BS,no use of accessory muscle Cardiovascular system: S1 & S2 +, No JVD. Gastrointestinal system: Abdomen soft, NGT+, FMS+, ND, BS+ Nervous System: Alert, awake, moving all extremities Extremities: LE edema neg,distal peripheral pulses palpable and warm.  Skin: No rashes,no icterus. MSK: SMALL muscle bulk,tone, power   Medications reviewed:  Scheduled Meds:  apixaban  10 mg Oral BID   Followed by   Melene Muller ON 03/03/2023] apixaban  5 mg Oral BID    carvedilol  3.125 mg Per NG tube  BID WC   cefadroxil  500 mg Oral BID   Chlorhexidine Gluconate Cloth  6 each Topical Daily   dronabinol  2.5 mg Oral BID AC   feeding supplement  1 Container Oral TID BM   feeding supplement (OSMOLITE 1.5 CAL)  480 mL Per Tube Q24H   feeding supplement (PROSource TF20)  60 mL Per Tube BID   influenza vaccine adjuvanted  0.5 mL Intramuscular Tomorrow-1000   loperamide  4 mg Oral QID   mouth rinse  15 mL Mouth Rinse 4 times per day   pantoprazole (PROTONIX) IV  40 mg Intravenous Q24H   spironolactone  50 mg Oral Daily  Continuous Infusions:   Diet Order             DIET DYS 3 Room service appropriate? Yes; Fluid consistency: Thin; Fluid restriction: 1500 mL Fluid  Diet effective now                   Intake/Output Summary (Last 24 hours) at 02/26/2023 1033 Last data filed at 02/26/2023 1000 Gross per 24 hour  Intake 380 ml  Output 1650 ml  Net -1270 ml   Net IO Since Admission: 1,738.39 mL [02/26/23 1033]  Wt Readings from Last 3 Encounters:  02/26/23 48.4 kg  06/30/21 49.9 kg  07/10/20 54.4 kg     Unresulted Labs (From admission, onward)     Start     Ordered   02/27/23 0600  Comprehensive metabolic panel  Every Mon,Thu,   R     Question:  Specimen collection method  Answer:  Lab=Lab collect   02/25/23 1750   02/27/23 0600  CBC  Every Mon,Thu,   R     Question:  Specimen collection method  Answer:  Lab=Lab collect   02/25/23 1750   02/22/23 1328  Acid Fast Culture with reflexed sensitivities  RELEASE UPON ORDERING,   TIMED        02/22/23 1328   02/22/23 1328  Acid Fast Smear (AFB)  RELEASE UPON ORDERING,   TIMED        02/22/23 1328   02/22/23 1326  Pathologist smear review  Once,   TIMED        02/22/23 1326   02/14/23 0500  CBC with Differential  Daily,   R,   Status:  Canceled      02/13/23 1733          Data Reviewed: I have personally reviewed following labs and imaging studies CBC: Recent Labs  Lab  02/21/23 0316 02/22/23 0927 02/23/23 0305 02/24/23 0310 02/25/23 0454  WBC 14.7* 9.9 10.0 9.6 7.4  HGB 11.7* 10.3* 10.2* 10.6* 10.1*  HCT 37.2 30.3* 31.7* 31.8* 30.1*  MCV 95.1 91.0 94.3 93.5 92.3  PLT 83* 83* 104* 122* 145*   Basic Metabolic Panel:  Recent Labs  Lab 02/19/23 1037 02/20/23 0254 02/20/23 0254 02/21/23 0316 02/22/23 0927 02/23/23 0305 02/24/23 0809 02/25/23 0454  NA  --  131*   < > 134* 130* 127* 130* 130*  K  --  3.7   < > 4.0 3.7 3.5 3.3* 3.8  CL  --  104   < > 107 104 103 102 102  CO2  --  20*   < > 22 21* 20* 21* 23  GLUCOSE  --  115*   < > 108* 110* 113* 131* 92  BUN  --  15   < > 15 17 16 14 11   CREATININE  --  0.40*   < > 0.43* 0.45 0.47 0.45 0.43*  CALCIUM  --  7.1*   < > 7.2* 7.0* 7.0* 7.4* 7.4*  MG  --  1.8  --   --   --   --   --   --   PHOS 2.1* 2.1*  --   --   --   --   --   --    < > = values in this interval not displayed.   GFR: Estimated Creatinine Clearance: 52.9 mL/min (A) (by C-G formula based on SCr of 0.43 mg/dL (L)). Liver Function Tests:  Recent Labs  Lab 02/20/23 0254 02/21/23 0316 02/22/23 0927 02/23/23 0305 02/25/23 0454  AST 69* 103* 78* 60* 45*  ALT 17 22 20 15 12   ALKPHOS 124 138* 129* 131* 135*  BILITOT 0.8 1.0 0.7 0.6 0.6  PROT 5.8* 5.6* 5.4* 5.3* 5.5*  ALBUMIN 1.9* 1.8* 1.6* 1.6* 1.8*   No results for input(s): "LIPASE", "AMYLASE" in the last 168 hours. No results for input(s): "AMMONIA" in the last 168 hours. Coagulation Profile:  Recent Labs  Lab 02/20/23 0254 02/21/23 0316 02/22/23 0926  INR 1.6* 1.3* 1.4*   Recent Labs  Lab 02/25/23 1621 02/25/23 2001 02/25/23 2342 02/26/23 0410 02/26/23 0727  GLUCAP 127* 121* 135* 121* 92   Recent Results (from the past 240 hour(s))  Aerobic/Anaerobic Culture w Gram Stain (surgical/deep wound)     Status: None   Collection Time: 02/17/23  3:15 PM   Specimen: Pleural Fluid  Result Value Ref Range Status   Specimen Description   Final    PLEURAL LEFT  LUNG Performed at [facility], [address] [place], [place] [address]    Special Requests   Final    NONE Performed at [facility], [address], [place], [place] [address]    Gram Stain   Final    ABUNDANT WBC PRESENT, PREDOMINANTLY PMN FEW GRAM POSITIVE COCCI IN CHAINS FEW GRAM POSITIVE COCCI IN PAIRS    Culture   Final    RARE STREPTOCOCCUS INTERMEDIUS NO ANAEROBES ISOLATED Performed at [facility], [address], [place], [place] [address]    Report Status 02/22/2023 FINAL  Final   Organism ID, Bacteria STREPTOCOCCUS INTERMEDIUS  Final      Susceptibility   Streptococcus intermedius - MIC*    PENICILLIN <=0.06 SENSITIVE Sensitive     CEFTRIAXONE <=0.12 SENSITIVE Sensitive     ERYTHROMYCIN >=8 RESISTANT Resistant     LEVOFLOXACIN 0.5 SENSITIVE Sensitive     VANCOMYCIN 0.5 SENSITIVE Sensitive     * RARE STREPTOCOCCUS INTERMEDIUS  Aerobic/Anaerobic Culture w Gram Stain (surgical/deep wound)     Status: None (Preliminary result)   Collection Time: 02/22/23  1:26 PM   Specimen: PATH Cytology Peritoneal fluid  Result Value Ref Range Status   Specimen Description   Final    PERITONEAL Performed at [facility], [address], [place], [place] [address]    Special Requests   Final    NONE Performed at [facility], [address] [place], [place] [address]    Gram Stain   Final    CYTOSPIN SMEAR WBC PRESENT,BOTH PMN AND MONONUCLEAR NO ORGANISMS SEEN    Culture   Final    NO GROWTH 3 DAYS NO ANAEROBES ISOLATED; CULTURE IN PROGRESS FOR 5 DAYS Performed at [facility], [address], [place], [place] [address]    Report  Status PENDING  Incomplete  Culture, body fluid w Gram Stain-bottle     Status: None (Preliminary result)   Collection Time: 02/22/23  1:26 PM   Specimen: Peritoneal Washings  Result Value Ref Range Status   Specimen Description PERITONEAL  Final    Special Requests NONE  Final   Gram Stain   Final    CYTOSPIN SMEAR WBC PRESENT,BOTH PMN AND MONONUCLEAR NO ORGANISMS SEEN    Culture   Final    NO GROWTH 4 DAYS Performed at [facility], [address], [place], [place] [address]    Report Status PENDING  Incomplete  Gram stain     Status: None   Collection Time: 02/22/23  1:26 PM   Specimen: Peritoneal Washings  Result Value Ref Range Status   Specimen Description PERITONEAL  Final   Special Requests NONE  Final   Gram Stain   Final    NO ORGANISMS SEEN WBC PRESENT, PREDOMINANTLY PMN CYTOSPIN SMEAR Performed at [facility], [address], [place], [place] [address]    Report Status 02/23/2023 FINAL  Final    Antimicrobials: Anti-infectives (From admission, onward)    Start     Dose/Rate Route Frequency Ordered Stop   02/24/23 2200  cefadroxil (DURICEF) capsule 500 mg        500 mg Oral 2 times daily 02/24/23 1730     02/16/23 0930  ceFAZolin (ANCEF) IVPB 2g/100 mL premix  Status:  Discontinued        2 g 200 mL/hr over 30 Minutes Intravenous Every 8 hours 02/16/23 0843 02/24/23 1730   02/15/23 1800  vancomycin (VANCOCIN) IVPB 1000 mg/200 mL premix  Status:  Discontinued        1,000 mg 200 mL/hr over 60 Minutes Intravenous Every 48 hours 02/13/23 1754 02/14/23 1437   02/15/23 1000  vancomycin (VANCOREADY) IVPB 750 mg/150 mL  Status:  Discontinued        750 mg 150 mL/hr over 60 Minutes Intravenous Daily 02/14/23 1437 02/16/23 0843   02/14/23 1400  vancomycin (VANCOREADY) IVPB 750 mg/150 mL        750 mg 150 mL/hr over 60 Minutes Intravenous  Once 02/14/23 1122 02/14/23 1501   02/14/23 1000  rifaximin (XIFAXAN) tablet 550 mg  Status:  Discontinued        550 mg Oral 3 times daily 02/14/23 0852 02/14/23 0855   02/14/23 1000  rifaximin (XIFAXAN) tablet 550 mg  Status:  Discontinued        550 mg Oral 2 times daily 02/14/23 0855 02/18/23 1438   02/13/23 2300  piperacillin-tazobactam (ZOSYN) IVPB 3.375 g   Status:  Discontinued        3.375 g 12.5 mL/hr over 240 Minutes Intravenous Every 8 hours 02/13/23 1803 02/16/23 0843   02/13/23 2200  metroNIDAZOLE (FLAGYL) IVPB 500 mg  Status:  Discontinued        500 mg 100 mL/hr over 60 Minutes Intravenous Every 12 hours 02/13/23 1733 02/13/23 1802   02/13/23 2000  ceFEPIme (MAXIPIME) 2 g in sodium chloride 0.9 % 100 mL IVPB  Status:  Discontinued        2 g 200 mL/hr over 30 Minutes Intravenous Every 8 hours 02/13/23 1733 02/13/23 1749   02/13/23 1800  vancomycin (VANCOCIN) IVPB 1000 mg/200 mL premix  Status:  Discontinued        1,000 mg 200 mL/hr over 60 Minutes Intravenous  Once 02/13/23 1745 02/13/23 1802   02/13/23 1100  ceFEPIme (MAXIPIME) 2 g in sodium chloride 0.9 % 100 mL IVPB        2 g 200 mL/hr over 30 Minutes Intravenous  Once 02/13/23 1052 02/13/23 1235   02/13/23 1100  metroNIDAZOLE (FLAGYL) IVPB 500 mg        500 mg 100 mL/hr over 60 Minutes Intravenous  Once 02/13/23 1052 02/13/23 1235   02/13/23 1100  vancomycin (VANCOCIN) IVPB 1000 mg/200 mL premix        1,000 mg 200 mL/hr over 60 Minutes Intravenous  Once 02/13/23 1052 02/13/23 1355      Culture/Microbiology    Component Value Date/Time   SDES  02/22/2023 1326    PERITONEAL Performed at [facility], [address], [place], [place] [address]    SDES PERITONEAL 02/22/2023 1326   SDES PERITONEAL 02/22/2023 1326   SPECREQUEST  02/22/2023 1326    NONE Performed at [facility], [address] [place], [place] [address]    SPECREQUEST NONE 02/22/2023 1326   SPECREQUEST NONE 02/22/2023 1326   CULT  02/22/2023 1326    NO GROWTH 3 DAYS NO ANAEROBES ISOLATED; CULTURE IN PROGRESS FOR 5 DAYS Performed at [facility], [address] [place], [place] [address]    CULT  02/22/2023 1326    NO GROWTH 4 DAYS Performed at [facility], [address] [place], [place] [address]    REPTSTATUS PENDING 02/22/2023 1326    REPTSTATUS PENDING 02/22/2023 1326   REPTSTATUS 02/23/2023 FINAL 02/22/2023 1326     Radiology Studies: No results found.   LOS: 13 days   Total time spent in review of labs and imaging, patient evaluation, formulation of plan, documentation and communication with family: 50 minutes  Lanae Boast, MD [facility]  02/26/2023, 10:33 AM

## 2023-02-27 DIAGNOSIS — A419 Sepsis, unspecified organism: Secondary | ICD-10-CM | POA: Diagnosis not present

## 2023-02-27 LAB — CULTURE, BODY FLUID W GRAM STAIN -BOTTLE: Culture: NO GROWTH

## 2023-02-27 LAB — CBC
HCT: 31.3 % — ABNORMAL LOW (ref 36.0–46.0)
Hemoglobin: 10.2 g/dL — ABNORMAL LOW (ref 12.0–15.0)
MCH: 31 pg (ref 26.0–34.0)
MCHC: 32.6 g/dL (ref 30.0–36.0)
MCV: 95.1 fL (ref 80.0–100.0)
Platelets: 190 10*3/uL (ref 150–400)
RBC: 3.29 MIL/uL — ABNORMAL LOW (ref 3.87–5.11)
RDW: 20.2 % — ABNORMAL HIGH (ref 11.5–15.5)
WBC: 6.1 10*3/uL (ref 4.0–10.5)
nRBC: 0 % (ref 0.0–0.2)

## 2023-02-27 LAB — COMPREHENSIVE METABOLIC PANEL
ALT: 19 U/L (ref 0–44)
AST: 57 U/L — ABNORMAL HIGH (ref 15–41)
Albumin: 1.8 g/dL — ABNORMAL LOW (ref 3.5–5.0)
Alkaline Phosphatase: 141 U/L — ABNORMAL HIGH (ref 38–126)
Anion gap: 5 (ref 5–15)
BUN: 13 mg/dL (ref 8–23)
CO2: 23 mmol/L (ref 22–32)
Calcium: 7.4 mg/dL — ABNORMAL LOW (ref 8.9–10.3)
Chloride: 102 mmol/L (ref 98–111)
Creatinine, Ser: 0.42 mg/dL — ABNORMAL LOW (ref 0.44–1.00)
GFR, Estimated: >60 mL/min (ref >60)
Glucose, Bld: 108 mg/dL — ABNORMAL HIGH (ref 70–99)
Potassium: 4.1 mmol/L (ref 3.5–5.1)
Sodium: 130 mmol/L — ABNORMAL LOW (ref 135–145)
Total Bilirubin: 0.6 mg/dL (ref <1.2)
Total Protein: 5.6 g/dL — ABNORMAL LOW (ref 6.5–8.1)

## 2023-02-27 LAB — GLUCOSE, CAPILLARY
Glucose-Capillary: 99 mg/dL (ref 70–99)
Glucose-Capillary: 78 mg/dL (ref 70–99)
Glucose-Capillary: 85 mg/dL (ref 70–99)
Glucose-Capillary: 127 mg/dL — ABNORMAL HIGH (ref 70–99)
Glucose-Capillary: 91 mg/dL (ref 70–99)

## 2023-02-27 LAB — AEROBIC/ANAEROBIC CULTURE W GRAM STAIN (SURGICAL/DEEP WOUND): Culture: NO GROWTH

## 2023-02-27 LAB — ACID FAST SMEAR (AFB, MYCOBACTERIA): Acid Fast Smear: NEGATIVE

## 2023-02-27 MED ORDER — ORAL CARE MOUTH RINSE
15.0000 mL | OROMUCOSAL | Status: DC
  Administered 2023-02-27 – 2023-03-02 (×12): 15 mL via OROMUCOSAL

## 2023-02-27 MED ORDER — ORAL CARE MOUTH RINSE: 15.0000 mL | OROMUCOSAL | Status: DC | PRN

## 2023-02-27 NOTE — Progress Notes (Signed)
PROGRESS NOTE Anita Price  PXT:[id number] DOB: July 28, 1955 DOA: 02/13/2023 PCP: Jackie Plum, MD  Brief Narrative/Hospital Course: 67 year old female with chronic medical history of Chronic hepatitis C/liver cirrhosis which has been untreated in the past--underlying cirrhosis, tobacco abuse,  Chronic pain syndrome due to chronic back pain--- underlying cervical radiculopathy status post C3-C6 ACDF Dr. Yevette Edwards--- follows with pain clinic in [place] is debating spinal cord stimulator trial, Degenerative joint disease, HTN,previous thyroid nodule, brought to the ED 11/14 for generalized weakness, anorexia nausea and vomiting multiple times x 3 to 4 days PTA. In the ED arousable but confused workup revealed large loculated left pleural effusion endometrial thickening splenic infarcts Patient was admitted on antibiotics underwent thoracentesis pulmonary IR consult with prolonged hospitalization and multiple procedure as below:   11/14: Admitted on antibiotics, thoracentesis with purulent material 11/15: Pulmonology consulted, chest tube inserted 11/16: Breathing worse; Up to 35L HFNC; Pleural Lytics given 11/18: Second chest tube placed by IR 11/19: Dobbhoff Cortrak placed--- palliative medicine consulted additionally for goals of care--remains full code. 11/22: Lower right-sided chest tube removed 11/23: all chest tubes removed 11/23: abd paracentesis done 450 cc   Significant studies: 11/14 CT head: NAD 11/14 CT chest: large left pleural effusion 11/15 Echo: Normal EF, normal valves 11/18 CT chest repeat moderate large volume left pleural effusion 20 cm greatest dimension, left base pigtail catheter drainage subscribe pragmatic right basilar nodular pulmonary opacities multifocal pneumonia 11/22 CT chest = substantial improvement left empyema lateral component completely evacuated residue 2 loculations 2.8 X3.9, 2.4 X3.1 11/22 CT abdomen pelvis cirrhosis liver portal hypertension  gastrorenal shunt distal varices moderate ascites anasarca-probable splenic infarct?  Laceration seems stable from prior with no extravasation?  Subsegmental pulmonary embolus posterior basilar right lower lobe 11/20 repeat CT chest with contrast shows right lower lobe segmental and subsegmental emboli no heart strain loculated effusion left with pleural thickening left lower lobe atelectasis small right pleural effusion with patchy airspace disease and cirrhosis?  Splenic infarct again seen        Subjective: Patient seen and examined this morning Overnight patient has been afebrile BP stable labs unchanged  Calorie count completed 11/27-for moderate malnutrition patient remained more than 90% of estimated nutritional diet if able to eat otherwise she is doing can discontinue NG tube feeding continue with boost breeze supplement Prosource She has been eating fairly again  Assessment and Plan: Principal Problem:   Sepsis due to Streptococcus intermedius empyema (HCC) Active Problems:   Empyema (HCC)   Acute metabolic encephalopathy   Essential hypertension   Tobacco use   Hyponatremia   Prolonged QT interval   Hepatic cirrhosis in setting of untreated hepatitis C    AKI (acute kidney injury) (HCC)   Abnormal ultrasound of uterus   Hypokalemia   Uncomplicated opioid dependence (HCC)   Thrombocytopenia (HCC)   Hypophosphatemia   Lactic acidosis   Malnutrition of moderate degree   Palliative care encounter   Goals of care, counseling/discussion   Counseling and coordination of care   Need for emotional support   Sepsis (HCC)   DNR (do not resuscitate) discussion   Poor appetite   Medication management   Streptococcus intermedius empyema on left Leaking chest tube site LLL Sepsis POA due to empyema: S/p multiple procedures including thoracentesis 11/14 with milky white output> left lower chest tube initially 11/15  s/p lytics, additional chest tube placed 11/18 and left lower  chest tube removed 11/22 and left anterior chest tube in 02/23/23. Pulmonary has signed off,  small effusion present but will likely self resolve or a scar down. Initially on cefazolin> changed to cefadroxil 11/24-dose adjusted 100 big 11.18> needs total 4 weeks course per PCCM- EOT 03/05/23.  Continue supplemental oxygen, incentive spirometry PT OT and monitor left lateral chest tube drain site. Recent Labs  Lab 02/22/23 0927 02/23/23 0305 02/24/23 0310 02/25/23 0454 02/27/23 0531  WBC 9.9 10.0 9.6 7.4 6.1    RLL pulmonary emboli: Stable continue Eliquis.    Acute metabolic encephalopathy History hepatoencephalopathy: Multifactorial due to sepsis, on chronic opiates.Unlikely hepatic encephalopathy.  No asterixis noted. Mentation fairly stable following commands. Has FMS.  Lactulose on hold, will resume rifaximin in next 24 hours  Diarrhea: While on tube feeding.  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  On antibiotics.  Monitor, continue on Aldactone, Coreg.  Metabolic acidosis:improved Hyponatremia intermittent-periodic lab checks--limit free water and fluid restrict 11/24> encourage oral intake mild but stable Hypokalemia replaced Hypoalbuminemia:Augment nutrition   Impaired glucose tolerance likely secondary to feeds-no workup unless above 180   Chronic pain syndrome from ACDF : Follows with pain clinic.  Continue on pain control MS IR 7.5 mg q 4 hr prn-Home dose 30 every 8, hold high doses for now Stopped--Lyrica , Flexeril 10 at bedtime and MS Contin 15 every 12   Degenerative joint disease NOS   Endometrial thickening Needs outpatient characterization not emergently   Thyroid  nodule Needs outpatient characterization not emergently  Failure to thrive/debility/deconditioning Palliative care already following.  Continue core track diet Overall prognosis does not appear bright.  Moderate malnutrition: Completing calorie count today. Nutrition Status: Nutrition Problem: Moderate Malnutrition Etiology: chronic illness Signs/Symptoms: moderate fat depletion, severe muscle depletion Interventions: Refer to RD note for recommendations, Boost Breeze, Tube feeding  DVT prophylaxis: SCDs Start: 02/13/23 1735 Eliquis Code Status:   Code Status: Limited: Do not attempt resuscitation (DNR) -DNR-LIMITED -Do Not Intubate/DNI  Family Communication: plan of care discussed with patient/NO FAMILY at bedside. Called to udpated her daughter but no answer 11/28. Patient status is: Inpatient because of encephalopathy and malnutrition Level of care: Progressive   Dispo: The patient is from: HOME, with daughter            Anticipated disposition: Skilled nursing facility once stable Objective: Vitals last 24 hrs: Vitals:   02/26/23 1129 02/26/23 2023 02/27/23 0410 02/27/23 0454  BP: 136/68 113/75 117/78   Pulse: 78 99 95   Resp: 16 16 (!) 22   Temp: 98.2 F (36.8 C) 98 F (36.7 C) 98.8 F (37.1 C)   TempSrc: Oral Oral Oral   SpO2: 100% 97% 100%   Weight:    48.6 kg  Height:       Weight change: 0.2 kg  Physical Examination: General exam: alert awake, pleasant, frail looking HEENT:Oral mucosa moist, Ear/Nose WNL grossly Respiratory system: Bilaterally clear BS,no use of accessory muscle Cardiovascular system: S1 & S2 +, No JVD. Gastrointestinal system: Abdomen soft,NT,ND, BS+ Nervous System: Alert, awake, moving all extremities,and following commands. Extremities: LE edema neg,distal peripheral pulses palpable and warm.  Skin: No rashes,no icterus. MSK: Normal muscle bulk,tone, power  Foley catheter present, FMS present NG tube present  Medications reviewed:   Scheduled Meds:  (feeding supplement) PROSource Plus  30 mL  Oral TID BM   apixaban  10 mg Oral BID   Followed by   Melene Muller ON 03/03/2023] apixaban  5 mg Oral BID   carvedilol  3.125 mg Per NG tube BID WC   cefadroxil  1,000 mg Oral BID   Chlorhexidine Gluconate Cloth  6 each Topical Daily   dronabinol  2.5 mg Oral BID AC   feeding supplement  1 Container Oral TID BM   feeding supplement (OSMOLITE 1.5 CAL)  480 mL Per Tube Q24H   influenza vaccine adjuvanted  0.5 mL Intramuscular Tomorrow-1000   loperamide  4 mg Oral QID   mouth rinse  15 mL Mouth Rinse 4 times per day   mouth rinse  15 mL Mouth Rinse 4 times per day   pantoprazole (PROTONIX) IV  40 mg Intravenous Q24H   spironolactone  50 mg Oral Daily  Continuous Infusions:   Diet Order             DIET DYS 3 Room service appropriate? Yes; Fluid consistency: Thin; Fluid restriction: 1500 mL Fluid  Diet effective now                   Intake/Output Summary (Last 24 hours) at 02/27/2023 1034 Last data filed at 02/27/2023 1610 Gross per 24 hour  Intake 520 ml  Output 925 ml  Net -405 ml   Net IO Since Admission: 1,325.39 mL [02/27/23 1034]  Wt Readings from Last 3 Encounters:  02/27/23 48.6 kg  06/30/21 49.9 kg  07/10/20 54.4 kg     Unresulted Labs (From admission, onward)     Start     Ordered   02/27/23 0600  Comprehensive metabolic panel  Every Mon,Thu,   R     Question:  Specimen collection method  Answer:  Lab=Lab collect   02/25/23 1750   02/27/23 0600  CBC  Every Mon,Thu,   R     Question:  Specimen collection method  Answer:  Lab=Lab collect   02/25/23 1750   02/22/23 1328  Acid Fast Culture with reflexed sensitivities  RELEASE UPON ORDERING,   TIMED        02/22/23 1328   02/22/23 1328  Acid Fast Smear (AFB)  RELEASE UPON ORDERING,   TIMED        02/22/23 1328   02/22/23 1326  Pathologist smear review  Once,   TIMED        02/22/23 1326   02/14/23 0500  CBC with Differential  Daily,   R,   Status:   Canceled      02/13/23 1733          Data Reviewed: I have personally reviewed following labs and imaging studies CBC: Recent Labs  Lab 02/22/23 0927 02/23/23 0305 02/24/23 0310 02/25/23 0454 02/27/23 0531  WBC 9.9 10.0 9.6 7.4 6.1  HGB 10.3* 10.2* 10.6* 10.1* 10.2*  HCT 30.3* 31.7* 31.8* 30.1* 31.3*  MCV 91.0 94.3 93.5 92.3 95.1  PLT 83* 104* 122* 145* 190   Basic Metabolic Panel:  Recent Labs  Lab 02/22/23 0927 02/23/23 0305 02/24/23 0809 02/25/23 0454 02/27/23 0531  NA 130* 127* 130* 130* 130*  K 3.7 3.5 3.3* 3.8 4.1  CL 104 103 102 102 102  CO2 21* 20* 21* 23 23  GLUCOSE 110* 113* 131* 92 108*  BUN 17 16 14 11 13   CREATININE 0.45 0.47 0.45 0.43* 0.42*  CALCIUM 7.0* 7.0* 7.4* 7.4* 7.4*   GFR: Estimated Creatinine Clearance: 53.1 mL/min (A) (  by C-G formula based on SCr of 0.42 mg/dL (L)). Liver Function Tests:  Recent Labs  Lab 02/21/23 0316 02/22/23 0927 02/23/23 0305 02/25/23 0454 02/27/23 0531  AST 103* 78* 60* 45* 57*  ALT 22 20 15 12 19   ALKPHOS 138* 129* 131* 135* 141*  BILITOT 1.0 0.7 0.6 0.6 0.6  PROT 5.6* 5.4* 5.3* 5.5* 5.6*  ALBUMIN 1.8* 1.6* 1.6* 1.8* 1.8*   No results for input(s): "LIPASE", "AMYLASE" in the last 168 hours. No results for input(s): "AMMONIA" in the last 168 hours. Coagulation Profile:  Recent Labs  Lab 02/21/23 0316 02/22/23 0926  INR 1.3* 1.4*   Recent Labs  Lab 02/26/23 1537 02/26/23 2021 02/26/23 2338 02/27/23 0408 02/27/23 0735  GLUCAP 144* 96 115* 99 78   Recent Results (from the past 240 hour(s))  Aerobic/Anaerobic Culture w Gram Stain (surgical/deep wound)     Status: None   Collection Time: 02/17/23  3:15 PM   Specimen: Pleural Fluid  Result Value Ref Range Status   Specimen Description   Final    PLEURAL LEFT LUNG Performed at [facility], [address] [place], [place] [address]    Special Requests   Final    NONE Performed at [facility], [address] [place], [place] [address]    Gram Stain   Final    ABUNDANT WBC PRESENT, PREDOMINANTLY PMN FEW GRAM POSITIVE COCCI IN CHAINS FEW GRAM POSITIVE COCCI IN PAIRS    Culture   Final    RARE STREPTOCOCCUS INTERMEDIUS NO ANAEROBES ISOLATED Performed at [facility], [address], [place], [place] [address]    Report Status 02/22/2023 FINAL  Final   Organism ID, Bacteria STREPTOCOCCUS INTERMEDIUS  Final      Susceptibility   Streptococcus intermedius - MIC*    PENICILLIN <=0.06 SENSITIVE Sensitive     CEFTRIAXONE <=0.12 SENSITIVE Sensitive     ERYTHROMYCIN >=8 RESISTANT Resistant     LEVOFLOXACIN 0.5 SENSITIVE Sensitive     VANCOMYCIN 0.5 SENSITIVE Sensitive     * RARE STREPTOCOCCUS INTERMEDIUS  Aerobic/Anaerobic Culture w Gram Stain (surgical/deep wound)     Status: None (Preliminary result)   Collection Time: 02/22/23  1:26 PM   Specimen: PATH Cytology Peritoneal fluid  Result Value Ref Range Status   Specimen Description   Final    PERITONEAL Performed at [facility], [address] [place], [place] [address]    Special Requests   Final    NONE Performed at [facility], [address], [place], [place] [address]    Gram Stain   Final    CYTOSPIN SMEAR WBC PRESENT,BOTH PMN AND MONONUCLEAR NO ORGANISMS SEEN    Culture   Final    NO GROWTH 4 DAYS NO ANAEROBES ISOLATED; CULTURE IN PROGRESS FOR 5 DAYS Performed at [facility], [address], [place], [place] [address]    Report Status PENDING  Incomplete  Culture, body fluid w Gram Stain-bottle     Status: None   Collection Time: 02/22/23  1:26 PM   Specimen: Peritoneal Washings  Result Value Ref Range Status   Specimen Description PERITONEAL  Final   Special Requests NONE  Final   Gram Stain   Final    CYTOSPIN SMEAR WBC PRESENT,BOTH PMN AND MONONUCLEAR NO ORGANISMS SEEN    Culture   Final    NO GROWTH 5 DAYS Performed at [facility], [address], [place], [place] [address]    Report Status 02/27/2023 FINAL  Final  Gram stain     Status: None   Collection Time: 02/22/23  1:26 PM   Specimen: Peritoneal Washings  Result Value Ref Range Status   Specimen Description PERITONEAL  Final   Special Requests NONE  Final   Gram Stain   Final    NO ORGANISMS SEEN WBC PRESENT, PREDOMINANTLY PMN CYTOSPIN SMEAR Performed at [facility], [address], [place], [place] [address]    Report Status 02/23/2023 FINAL  Final    Antimicrobials: Anti-infectives (From admission, onward)    Start     Dose/Rate Route Frequency Ordered Stop   02/26/23 2200  cefadroxil (DURICEF) capsule 1,000 mg        1,000 mg Oral 2 times daily 02/26/23 1444     02/24/23 2200  cefadroxil (DURICEF) capsule 500 mg  Status:  Discontinued        500 mg Oral 2 times daily 02/24/23 1730 02/26/23 1444   02/16/23 0930  ceFAZolin (ANCEF) IVPB 2g/100 mL premix  Status:  Discontinued        2 g 200 mL/hr over 30 Minutes Intravenous Every 8 hours 02/16/23 0843 02/24/23 1730   02/15/23 1800  vancomycin (VANCOCIN) IVPB 1000 mg/200 mL premix  Status:  Discontinued        1,000 mg 200 mL/hr over 60 Minutes Intravenous Every 48 hours 02/13/23 1754 02/14/23 1437   02/15/23 1000  vancomycin (VANCOREADY) IVPB 750 mg/150 mL  Status:  Discontinued        750 mg 150 mL/hr over 60 Minutes Intravenous Daily 02/14/23 1437 02/16/23 0843   02/14/23 1400  vancomycin (VANCOREADY) IVPB 750 mg/150 mL        750 mg 150 mL/hr over 60 Minutes Intravenous  Once 02/14/23 1122 02/14/23 1501   02/14/23 1000  rifaximin (XIFAXAN) tablet 550 mg  Status:  Discontinued        550 mg Oral 3 times daily 02/14/23 0852 02/14/23 0855   02/14/23 1000  rifaximin (XIFAXAN) tablet 550 mg  Status:  Discontinued        550 mg Oral 2 times daily 02/14/23 0855 02/18/23 1438   02/13/23 2300  piperacillin-tazobactam (ZOSYN) IVPB 3.375 g  Status:  Discontinued        3.375 g 12.5 mL/hr over 240 Minutes  Intravenous Every 8 hours 02/13/23 1803 02/16/23 0843   02/13/23 2200  metroNIDAZOLE (FLAGYL) IVPB 500 mg  Status:  Discontinued        500 mg 100 mL/hr over 60 Minutes Intravenous Every 12 hours 02/13/23 1733 02/13/23 1802   02/13/23 2000  ceFEPIme (MAXIPIME) 2 g in sodium chloride 0.9 % 100 mL IVPB  Status:  Discontinued        2 g 200 mL/hr over 30 Minutes Intravenous Every 8 hours 02/13/23 1733 02/13/23 1749   02/13/23 1800  vancomycin (VANCOCIN) IVPB 1000 mg/200 mL premix  Status:  Discontinued        1,000 mg 200 mL/hr over 60 Minutes Intravenous  Once 02/13/23 1745 02/13/23 1802   02/13/23 1100  ceFEPIme (MAXIPIME) 2 g in sodium chloride 0.9 % 100 mL IVPB        2 g 200 mL/hr over 30 Minutes Intravenous  Once 02/13/23 1052 02/13/23 1235   02/13/23 1100  metroNIDAZOLE (FLAGYL) IVPB 500 mg        500 mg 100 mL/hr over 60 Minutes Intravenous  Once 02/13/23 1052 02/13/23  1235   02/13/23 1100  vancomycin (VANCOCIN) IVPB 1000 mg/200 mL premix        1,000 mg 200 mL/hr over 60 Minutes Intravenous  Once 02/13/23 1052 02/13/23 1355      Culture/Microbiology    Component Value Date/Time   SDES  02/22/2023 1326    PERITONEAL Performed at [facility], [address] [place], [place] [address]    SDES PERITONEAL 02/22/2023 1326   SDES PERITONEAL 02/22/2023 1326   SPECREQUEST  02/22/2023 1326    NONE Performed at [facility], [address], [place], [place] [address]    SPECREQUEST NONE 02/22/2023 1326   SPECREQUEST NONE 02/22/2023 1326   CULT  02/22/2023 1326    NO GROWTH 4 DAYS NO ANAEROBES ISOLATED; CULTURE IN PROGRESS FOR 5 DAYS Performed at [facility], [address] [place], [place] [address]    CULT  02/22/2023 1326    NO GROWTH 5 DAYS Performed at [facility], [address], [place], [place] [address]    REPTSTATUS PENDING 02/22/2023 1326   REPTSTATUS 02/27/2023 FINAL 02/22/2023 1326   REPTSTATUS 02/23/2023  FINAL 02/22/2023 1326     Radiology Studies: No results found.   LOS: 14 days   Total time spent in review of labs and imaging, patient evaluation, formulation of plan, documentation and communication with family: 35 minutes  Lanae Boast, MD [facility]  02/27/2023, 10:34 AM

## 2023-02-28 DIAGNOSIS — A419 Sepsis, unspecified organism: Secondary | ICD-10-CM | POA: Diagnosis not present

## 2023-02-28 LAB — GLUCOSE, CAPILLARY
Glucose-Capillary: 133 mg/dL — ABNORMAL HIGH (ref 70–99)
Glucose-Capillary: 137 mg/dL — ABNORMAL HIGH (ref 70–99)
Glucose-Capillary: 159 mg/dL — ABNORMAL HIGH (ref 70–99)
Glucose-Capillary: 163 mg/dL — ABNORMAL HIGH (ref 70–99)
Glucose-Capillary: 74 mg/dL (ref 70–99)
Glucose-Capillary: 87 mg/dL (ref 70–99)

## 2023-02-28 MED ORDER — ZINC OXIDE 40 % EX OINT
TOPICAL_OINTMENT | Freq: Two times a day (BID) | CUTANEOUS | Status: DC | PRN
  Administered 2023-03-01: 1 via TOPICAL
  Filled 2023-02-28: qty 57

## 2023-02-28 MED ORDER — MORPHINE SULFATE 15 MG PO TABS
7.5000 mg | ORAL_TABLET | ORAL | Status: DC | PRN
  Administered 2023-02-28 – 2023-03-05 (×12): 7.5 mg via ORAL
  Filled 2023-02-28 (×13): qty 1

## 2023-02-28 MED ORDER — ZINC OXIDE 40 % EX OINT: TOPICAL_OINTMENT | Freq: Every day | CUTANEOUS | Status: DC

## 2023-02-28 MED ORDER — ACETAMINOPHEN 650 MG RE SUPP: 650.0000 mg | Freq: Four times a day (QID) | RECTAL | Status: DC | PRN

## 2023-02-28 MED ORDER — ACETAMINOPHEN 160 MG/5ML PO SOLN
650.0000 mg | Freq: Four times a day (QID) | ORAL | Status: DC | PRN
  Administered 2023-03-04: 650 mg via ORAL
  Filled 2023-02-28: qty 20.3

## 2023-02-28 MED ORDER — CARVEDILOL 3.125 MG PO TABS
3.1250 mg | ORAL_TABLET | Freq: Two times a day (BID) | ORAL | Status: DC
  Administered 2023-02-28 – 2023-03-02 (×5): 3.125 mg via ORAL
  Filled 2023-02-28 (×5): qty 1

## 2023-02-28 NOTE — Plan of Care (Signed)
?  Problem: Nutrition: Goal: Adequate nutrition will be maintained Outcome: Progressing   Problem: Elimination: Goal: Will not experience complications related to urinary retention Outcome: Progressing   Problem: Safety: Goal: Ability to remain free from injury will improve Outcome: Progressing   

## 2023-02-28 NOTE — Plan of Care (Signed)
  Problem: Fluid Volume: Goal: Hemodynamic stability will improve Outcome: Progressing   Problem: Clinical Measurements: Goal: Diagnostic test results will improve Outcome: Progressing Goal: Signs and symptoms of infection will decrease Outcome: Progressing   Problem: Education: Goal: Knowledge of General Education information will improve Description: Including pain rating scale, medication(s)/side effects and non-pharmacologic comfort measures Outcome: Progressing   Problem: Health Behavior/Discharge Planning: Goal: Ability to manage health-related needs will improve Outcome: Progressing   Problem: Clinical Measurements: Goal: Ability to maintain clinical measurements within normal limits will improve Outcome: Progressing Goal: Will remain free from infection Outcome: Progressing Goal: Diagnostic test results will improve Outcome: Progressing   Problem: Activity: Goal: Risk for activity intolerance will decrease Outcome: Progressing   Problem: Nutrition: Goal: Adequate nutrition will be maintained Outcome: Progressing   Problem: Coping: Goal: Level of anxiety will decrease Outcome: Progressing   Problem: Pain Management: Goal: General experience of comfort will improve Outcome: Progressing   Problem: Safety: Goal: Ability to remain free from injury will improve Outcome: Progressing   Problem: Skin Integrity: Goal: Risk for impaired skin integrity will decrease Outcome: Progressing   Problem: Respiratory: Goal: Ability to maintain adequate ventilation will improve Outcome: Adequate for Discharge   Problem: Clinical Measurements: Goal: Respiratory complications will improve Outcome: Adequate for Discharge Goal: Cardiovascular complication will be avoided Outcome: Adequate for Discharge

## 2023-02-28 NOTE — Progress Notes (Signed)
PROGRESS NOTE Anita Price  MVH:[id number] DOB: [birth year] DOA: 02/13/2023 PCP: Jackie Plum, MD  Brief Narrative/Hospital Course: 67 year old female with chronic medical history of Chronic hepatitis C/liver cirrhosis which has been untreated in the past--underlying cirrhosis, tobacco abuse,  Chronic pain syndrome due to chronic back pain--- underlying cervical radiculopathy status post C3-C6 ACDF Dr. Yevette Edwards--- follows with pain clinic in [place] is debating spinal cord stimulator trial, Degenerative joint disease, HTN,previous thyroid nodule, brought to the ED 11/14 for generalized weakness, anorexia nausea and vomiting multiple times x 3 to 4 days PTA. In the ED arousable but confused workup revealed large loculated left pleural effusion endometrial thickening splenic infarcts Patient was admitted on antibiotics underwent thoracentesis pulmonary IR consult with prolonged hospitalization and multiple procedure as below:   11/14: Admitted on antibiotics, thoracentesis with purulent material 11/15: Pulmonology consulted, chest tube inserted 11/16: Breathing worse; Up to 35L HFNC; Pleural Lytics given 11/18: Second chest tube placed by IR 11/19: Dobbhoff Cortrak placed--- palliative medicine consulted additionally for goals of care--remains full code. 11/22: Lower right-sided chest tube removed 11/23: all chest tubes removed 11/23: abd paracentesis done 450 cc  11/28: Doing well oriented NG tube feeding, FMS and Foley catheter discontinued  Significant studies: 11/14 CT head: NAD 11/14 CT chest: large left pleural effusion 11/15 Echo: Normal EF, normal valves 11/18 CT chest repeat moderate large volume left pleural effusion 20 cm greatest dimension, left base pigtail catheter drainage subscribe pragmatic right basilar nodular pulmonary opacities multifocal pneumonia 11/22 CT chest = substantial improvement left empyema lateral component completely evacuated residue 2 loculations 2.8  X3.9, 2.4 X3.1 11/22 CT abdomen pelvis cirrhosis liver portal hypertension gastrorenal shunt distal varices moderate ascites anasarca-probable splenic infarct?  Laceration seems stable from prior with no extravasation?  Subsegmental pulmonary embolus posterior basilar right lower lobe 11/20 repeat CT chest with contrast shows right lower lobe segmental and subsegmental emboli no heart strain loculated effusion left with pleural thickening left lower lobe atelectasis small right pleural effusion with patchy airspace disease and cirrhosis?  Splenic infarct again seen  Subjective: Patient seen and examined Resting comfortably no complaints Not febrile BP stable overnight. Blood sugar has been returning 74-1 63  Assessment and Plan: Principal Problem:   Sepsis due to Streptococcus intermedius empyema (HCC) Active Problems:   Empyema (HCC)   Acute metabolic encephalopathy   Essential hypertension   Tobacco use   Hyponatremia   Prolonged QT interval   Hepatic cirrhosis in setting of untreated hepatitis C    AKI (acute kidney injury) (HCC)   Abnormal ultrasound of uterus   Hypokalemia   Uncomplicated opioid dependence (HCC)   Thrombocytopenia (HCC)   Hypophosphatemia   Lactic acidosis   Malnutrition of moderate degree   Palliative care encounter   Goals of care, counseling/discussion   Counseling and coordination of care   Need for emotional support   Sepsis (HCC)   DNR (do not resuscitate) discussion   Poor appetite   Medication management   Streptococcus intermedius empyema on left Leaking chest tube site LLL Sepsis POA due to empyema: S/p multiple procedures including thoracentesis 11/14 with milky white output> left lower chest tube initially 11/15  s/p lytics, additional chest tube placed 11/18 and left lower chest tube removed 11/22 and left anterior chest tube in 02/23/23. Pulmonary has signed off, small effusion present but will likely self resolve or a scar down- has  ostomy bag placed at the site of previous chest tube drainage and appears dry-continue wound care. Initially  on cefazolin> changed to cefadroxil 11/24-dose adjusted 11/28> needs total 4 weeks course per PCCM- EOT 03/05/23.  Continue supplemental oxygen, incentive spirometry PT OT and monitor left lateral chest tube drain site. 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  Without asterixis and mentation status at baseline. Of FMS.  Avoid constipation can use as needed lactulose  Diarrhea: While on tube feeding. Off FMS. Continue Imodium scheduled -previously ID was consulted and discussed.  Dysphagia: SLP following continue DOS 3 diet.  Tolerating. 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 Continue on Aldactone, Coreg, avoid constipation.  Metabolic acidosis:improved Hyponatremia : low but stable encourage oral intake  Hypokalemia resolved Hypoalbuminemia:Augment nutrition   Impaired glucose tolerance likely secondary to feeds-no workup unless above 180   Chronic pain syndrome from ACDF : Follows with pain clinic.  Continue on pain control MS IR 7.5 mg q 4 hr prn-Home dose 30 every 8, hold high doses for now Stopped--Lyrica , Flexeril 10 at bedtime and MS Contin 15 every 12   Degenerative joint disease NOS   Endometrial thickening Needs outpatient characterization not emergently   Thyroid nodule Needs outpatient characterization not emergently  Failure to thrive/debility/deconditioning Palliative care already following.  Continue core track diet Overall prognosis does  not appear bright.  Moderate malnutrition: Completing calorie count today. Nutrition Status: Nutrition Problem: Moderate Malnutrition Etiology: chronic illness Signs/Symptoms: moderate fat depletion, severe muscle depletion Interventions: Refer to RD note for recommendations, Boost Breeze, Tube feeding  DVT prophylaxis: SCDs Start: 02/13/23 1735 Eliquis Code Status:   Code Status: Limited: Do not attempt resuscitation (DNR) -DNR-LIMITED -Do Not Intubate/DNI  Family Communication: plan of care discussed with patient/NO FAMILY at bedside. Called to udpated her daughter but no answer 11/28. Patient status is: Inpatient because of encephalopathy and malnutrition Level of care: Progressive   Dispo: The patient is from: HOME, with daughter            Anticipated disposition: Skilled nursing facility once stable Objective: Vitals last 24 hrs: Vitals:   02/27/23 1401 02/27/23 2011 02/28/23 0406 02/28/23 0839  BP: 101/62 115/68 123/74 129/73  Pulse: 99 85 98 97  Resp: 16 18 16    Temp: 97.6 F (36.4 C) 98.4 F (36.9 C) 99.5 F (37.5 C)   TempSrc: Oral Oral Oral   SpO2: 100% 95% 100%   Weight:   50.2 kg   Height:       Weight change: 1.6 kg  Physical Examination: General exam: alert awake, frail ill-appearing  HEENT:Oral mucosa moist, Ear/Nose WNL grossly Respiratory system: Bilaterally clear BS,no use of accessory muscle, left lower chest wall PREVIOUS Chest tube site covered w/ ostomy bag-no drainage Cardiovascular system: S1 & S2 +, No JVD. Gastrointestinal system: Abdomen soft,NT,ND, BS+ Nervous System: Alert, awake, moving all extremities,and w/ generalized weakness Extremities: LE edema neg,distal peripheral pulses palpable and warm.  Skin: No rashes,no icterus. MSK: Normal muscle bulk,tone, power   Medications reviewed:  Scheduled Meds:  (feeding supplement) PROSource Plus  30 mL Oral TID BM   apixaban  10 mg Oral BID   Followed by   Melene Muller ON 03/03/2023] apixaban  5 mg  Oral BID  carvedilol  3.125 mg Oral BID WC   cefadroxil  1,000 mg Oral BID   Chlorhexidine Gluconate Cloth  6 each Topical Daily   dronabinol  2.5 mg Oral BID AC   feeding supplement  1 Container Oral TID BM   feeding supplement (OSMOLITE 1.5 CAL)  480 mL Per Tube Q24H   influenza vaccine adjuvanted  0.5 mL Intramuscular Tomorrow-1000   loperamide  4 mg Oral QID   mouth rinse  15 mL Mouth Rinse 4 times per day   mouth rinse  15 mL Mouth Rinse 4 times per day   pantoprazole (PROTONIX) IV  40 mg Intravenous Q24H   spironolactone  50 mg Oral Daily  Continuous Infusions:   Diet Order             DIET DYS 3 Room service appropriate? Yes; Fluid consistency: Thin; Fluid restriction: 1500 mL Fluid  Diet effective now                   Intake/Output Summary (Last 24 hours) at 02/28/2023 1040 Last data filed at 02/28/2023 0923 Gross per 24 hour  Intake 777 ml  Output 750 ml  Net 27 ml   Net IO Since Admission: 1,352.39 mL [02/28/23 1040]  Wt Readings from Last 3 Encounters:  02/28/23 50.2 kg  06/30/21 49.9 kg  07/10/20 54.4 kg     Unresulted Labs (From admission, onward)     Start     Ordered   02/27/23 0600  Comprehensive metabolic panel  Every Mon,Thu,   R     Question:  Specimen collection method  Answer:  Lab=Lab collect   02/25/23 1750   02/27/23 0600  CBC  Every Mon,Thu,   R     Question:  Specimen collection method  Answer:  Lab=Lab collect   02/25/23 1750   02/22/23 1328  Acid Fast Culture with reflexed sensitivities  RELEASE UPON ORDERING,   TIMED        02/22/23 1328   02/22/23 1326  Pathologist smear review  Once,   TIMED        02/22/23 1326   02/14/23 0500  CBC with Differential  Daily,   R,   Status:  Canceled      02/13/23 1733          Data Reviewed: I have personally reviewed following labs and imaging studies CBC: Recent Labs  Lab 02/22/23 0927 02/23/23 0305 02/24/23 0310 02/25/23 0454 02/27/23 0531  WBC 9.9 10.0 9.6 7.4 6.1  HGB 10.3*  10.2* 10.6* 10.1* 10.2*  HCT 30.3* 31.7* 31.8* 30.1* 31.3*  MCV 91.0 94.3 93.5 92.3 95.1  PLT 83* 104* 122* 145* 190   Basic Metabolic Panel:  Recent Labs  Lab 02/22/23 0927 02/23/23 0305 02/24/23 0809 02/25/23 0454 02/27/23 0531  NA 130* 127* 130* 130* 130*  K 3.7 3.5 3.3* 3.8 4.1  CL 104 103 102 102 102  CO2 21* 20* 21* 23 23  GLUCOSE 110* 113* 131* 92 108*  BUN 17 16 14 11 13   CREATININE 0.45 0.47 0.45 0.43* 0.42*  CALCIUM 7.0* 7.0* 7.4* 7.4* 7.4*   GFR: Estimated Creatinine Clearance: 54.8 mL/min (A) (by C-G formula based on SCr of 0.42 mg/dL (L)). Liver Function Tests:  Recent Labs  Lab 02/22/23 0927 02/23/23 0305 02/25/23 0454 02/27/23 0531  AST 78* 60* 45* 57*  ALT 20 15 12 19   ALKPHOS 129* 131* 135* 141*  BILITOT 0.7 0.6 0.6 0.6  PROT 5.4* 5.3* 5.5*  5.6*  ALBUMIN 1.6* 1.6* 1.8* 1.8*   No results for input(s): "LIPASE", "AMYLASE" in the last 168 hours. No results for input(s): "AMMONIA" in the last 168 hours. Coagulation Profile:  Recent Labs  Lab 02/22/23 0926  INR 1.4*   Recent Labs  Lab 02/27/23 1532 02/27/23 2013 02/28/23 0001 02/28/23 0407 02/28/23 0720  GLUCAP 127* 91 87 163* 74   Recent Results (from the past 240 hour(s))  Aerobic/Anaerobic Culture w Gram Stain (surgical/deep wound)     Status: None   Collection Time: 02/22/23  1:26 PM   Specimen: PATH Cytology Peritoneal fluid  Result Value Ref Range Status   Specimen Description   Final    PERITONEAL Performed at [facility], [address] [place], [place] [address]    Special Requests   Final    NONE Performed at [facility], [address], [place], [place] [address]    Gram Stain   Final    CYTOSPIN SMEAR WBC PRESENT,BOTH PMN AND MONONUCLEAR NO ORGANISMS SEEN    Culture   Final    No growth aerobically or anaerobically. Performed at [facility], [address] [place], [place] [address]    Report Status 02/27/2023 FINAL   Final  Acid Fast Smear (AFB)     Status: None   Collection Time: 02/22/23  1:26 PM   Specimen: PATH Cytology Peritoneal fluid  Result Value Ref Range Status   AFB Specimen Processing Concentration  Final   Acid Fast Smear Negative  Final    Comment: (NOTE) Performed At: [facility] [address] [place], [place] [id number] Jolene Schimke MD VH:[id number]    Source (AFB) PERITONEAL  Final    Comment: Performed at [facility], [address], [place], [place] [address]  Culture, body fluid w Gram Stain-bottle     Status: None   Collection Time: 02/22/23  1:26 PM   Specimen: Peritoneal Washings  Result Value Ref Range Status   Specimen Description PERITONEAL  Final   Special Requests NONE  Final   Gram Stain   Final    CYTOSPIN SMEAR WBC PRESENT,BOTH PMN AND MONONUCLEAR NO ORGANISMS SEEN    Culture   Final    NO GROWTH 5 DAYS Performed at [facility], [address], [place], [place] [address]    Report Status 02/27/2023 FINAL  Final  Gram stain     Status: None   Collection Time: 02/22/23  1:26 PM   Specimen: Peritoneal Washings  Result Value Ref Range Status   Specimen Description PERITONEAL  Final   Special Requests NONE  Final   Gram Stain   Final    NO ORGANISMS SEEN WBC PRESENT, PREDOMINANTLY PMN CYTOSPIN SMEAR Performed at [facility], [address] [place], [place] [address]    Report Status 02/23/2023 FINAL  Final    Antimicrobials: Anti-infectives (From admission, onward)    Start     Dose/Rate Route Frequency Ordered Stop   02/26/23 2200  cefadroxil (DURICEF) capsule 1,000 mg        1,000 mg Oral 2 times daily 02/26/23 1444     02/24/23 2200  cefadroxil (DURICEF) capsule 500 mg  Status:  Discontinued        500 mg Oral 2 times daily 02/24/23 1730 02/26/23 1444   02/16/23 0930  ceFAZolin (ANCEF) IVPB 2g/100 mL premix  Status:  Discontinued        2 g 200 mL/hr  over 30 Minutes Intravenous Every 8 hours 02/16/23  0843 02/24/23 1730   02/15/23 1800  vancomycin (VANCOCIN) IVPB 1000 mg/200 mL premix  Status:  Discontinued        1,000 mg 200 mL/hr over 60 Minutes Intravenous Every 48 hours 02/13/23 1754 02/14/23 1437   02/15/23 1000  vancomycin (VANCOREADY) IVPB 750 mg/150 mL  Status:  Discontinued        750 mg 150 mL/hr over 60 Minutes Intravenous Daily 02/14/23 1437 02/16/23 0843   02/14/23 1400  vancomycin (VANCOREADY) IVPB 750 mg/150 mL        750 mg 150 mL/hr over 60 Minutes Intravenous  Once 02/14/23 1122 02/14/23 1501   02/14/23 1000  rifaximin (XIFAXAN) tablet 550 mg  Status:  Discontinued        550 mg Oral 3 times daily 02/14/23 0852 02/14/23 0855   02/14/23 1000  rifaximin (XIFAXAN) tablet 550 mg  Status:  Discontinued        550 mg Oral 2 times daily 02/14/23 0855 02/18/23 1438   02/13/23 2300  piperacillin-tazobactam (ZOSYN) IVPB 3.375 g  Status:  Discontinued        3.375 g 12.5 mL/hr over 240 Minutes Intravenous Every 8 hours 02/13/23 1803 02/16/23 0843   02/13/23 2200  metroNIDAZOLE (FLAGYL) IVPB 500 mg  Status:  Discontinued        500 mg 100 mL/hr over 60 Minutes Intravenous Every 12 hours 02/13/23 1733 02/13/23 1802   02/13/23 2000  ceFEPIme (MAXIPIME) 2 g in sodium chloride 0.9 % 100 mL IVPB  Status:  Discontinued        2 g 200 mL/hr over 30 Minutes Intravenous Every 8 hours 02/13/23 1733 02/13/23 1749   02/13/23 1800  vancomycin (VANCOCIN) IVPB 1000 mg/200 mL premix  Status:  Discontinued        1,000 mg 200 mL/hr over 60 Minutes Intravenous  Once 02/13/23 1745 02/13/23 1802   02/13/23 1100  ceFEPIme (MAXIPIME) 2 g in sodium chloride 0.9 % 100 mL IVPB        2 g 200 mL/hr over 30 Minutes Intravenous  Once 02/13/23 1052 02/13/23 1235   02/13/23 1100  metroNIDAZOLE (FLAGYL) IVPB 500 mg        500 mg 100 mL/hr over 60 Minutes Intravenous  Once 02/13/23 1052 02/13/23 1235   02/13/23 1100  vancomycin (VANCOCIN) IVPB 1000 mg/200 mL premix        1,000 mg 200 mL/hr over 60  Minutes Intravenous  Once 02/13/23 1052 02/13/23 1355      Culture/Microbiology    Component Value Date/Time   SDES  02/22/2023 1326    PERITONEAL Performed at [facility], [address], [place], [place] [address]    SDES PERITONEAL 02/22/2023 1326   SDES PERITONEAL 02/22/2023 1326   SPECREQUEST  02/22/2023 1326    NONE Performed at [facility], [address] [place], [place] [address]    SPECREQUEST NONE 02/22/2023 1326   SPECREQUEST NONE 02/22/2023 1326   CULT  02/22/2023 1326    No growth aerobically or anaerobically. Performed at [facility], [address], [place], [place] [address]    CULT  02/22/2023 1326    NO GROWTH 5 DAYS Performed at [facility], [address], [place], [place] [address]    REPTSTATUS 02/27/2023 FINAL 02/22/2023 1326   REPTSTATUS 02/27/2023 FINAL 02/22/2023 1326   REPTSTATUS 02/23/2023 FINAL 02/22/2023 1326     Radiology Studies:  No results found.   LOS: 15 days   Total time spent in review of labs and imaging, patient evaluation, formulation of plan, documentation and communication with family: 35 minutes  Lanae Boast, MD [facility]  02/28/2023, 10:40 AM

## 2023-02-28 NOTE — TOC Progression Note (Addendum)
Transition of Care [facility]) - Progression Note    Patient Details  Name: Anita Price MRN: [id number] Date of Birth: 10-17-1955  [facility] CM/SW Contact  Adrian Prows, RN Phone Number: 02/28/2023, 4:09 PM  Clinical Narrative:    Notified pt ready for SNF; spoke w/ pt in room; her dtr [name] was placed on speaker phone; they agree to SNF; they do not have facility preference; explained SNF process; her dtr would like bed search limited to [facility]; faxed out; awaiting bed offers.   Expected Discharge Plan:  (TBD) Barriers to Discharge: Continued Medical Work up  Expected Discharge Plan and Services In-house Referral: Clinical Social Work   Post Acute Care Choice: Home Health Living arrangements for the past 2 months: Apartment                                       Social Determinants of Health (SDOH) Interventions SDOH Screenings   Food Insecurity: No Food Insecurity (02/14/2023)  Housing: Low Risk  (02/14/2023)  Transportation Needs: No Transportation Needs (02/14/2023)  Utilities: Not At Risk (02/14/2023)  Social Connections: Unknown (08/01/2021)   Received from [facility]  Tobacco Use: High Risk (02/17/2023)    Readmission Risk Interventions    02/26/2023   10:09 AM 02/16/2023    3:49 PM 07/13/2020   11:39 AM  Readmission Risk Prevention Plan  Post Dischage Appt   Not Complete  Appt Comments   attempted to call PCP to schedule f/u- however office informed us that patient would need to call for appointment  Medication Screening   Complete  Transportation Screening Complete Not Complete Complete  Transportation Screening Comment  Patient only alert and orieted x1, unable to complete transportation screening.   PCP or Specialist Appt within 5-7 Days  Not Complete   Not Complete comments  Patient only alert and oriented x1 can't schedule an appt.   PCP or Specialist Appt within 3-5 Days Complete    Home Care  Screening  Not Complete   Home Care Screening Not Completed Comments  Alert and oriented x1 and not medically ready for HH.   Medication Review (RN CM)  Referral to Pharmacy   HRI or Home Care Consult Complete    Social Work Consult for Recovery Care Planning/Counseling Complete    Palliative Care Screening Complete    Medication Review Oceanographer) Complete

## 2023-02-28 NOTE — [place] FL2 (Signed)
[place] MEDICAID FL2 LEVEL OF CARE FORM     IDENTIFICATION  Patient Name: Anita Price Birthdate: 07/13/1955 Sex: female Admission Date (Current Location): 02/13/2023  [facility] and [place] Number:  Producer, television/film/video and Address:  [facility],  [address] [place], [place] [address]      Provider Number: [id number]  Attending Physician Name and Address:  Lanae Boast, MD  Relative Name and Phone Number:  [name] (grand daughter) [phone]    Current Level of Care: Hospital Recommended Level of Care: Skilled Nursing Facility Prior Approval Number:    Date Approved/Denied:   PASRR Number: [id number]  Discharge Plan: SNF    Current Diagnoses: Patient Active Problem List   Diagnosis Date Noted   Poor appetite 02/24/2023   Medication management 02/24/2023   DNR (do not resuscitate) discussion 02/21/2023   Lactic acidosis 02/18/2023   Malnutrition of moderate degree 02/18/2023   Palliative care encounter 02/18/2023   Goals of care, counseling/discussion 02/18/2023   Counseling and coordination of care 02/18/2023   Need for emotional support 02/18/2023   Sepsis (HCC) 02/18/2023   Hypophosphatemia 02/16/2023   Thrombocytopenia (HCC) 02/15/2023   Abnormal ultrasound of uterus 02/14/2023   Hypokalemia 02/14/2023   Uncomplicated opioid dependence (HCC) 02/14/2023   Empyema (HCC) 02/13/2023   AKI (acute kidney injury) (HCC) 02/13/2023   Acute metabolic encephalopathy 06/30/2021   Diarrhea 06/30/2021   Hepatic cirrhosis in setting of untreated hepatitis C     Essential hypertension 07/10/2020   Hepatitis C 07/10/2020   Tobacco use 07/10/2020   Macrocytosis 07/10/2020   Hyponatremia 07/10/2020   Hyperglycemia 07/10/2020   Prolonged QT interval 07/10/2020   Sepsis due to Streptococcus intermedius empyema (HCC) 07/10/2020   Cervical radiculopathy 12/04/2014   Chronic pain syndrome due to chronic back pain  08/27/2011    Orientation RESPIRATION  BLADDER Height & Weight     Self, Situation, Place  Normal External catheter Weight: 50.2 kg Height:  5\' 5"  (165.1 cm)  BEHAVIORAL SYMPTOMS/MOOD NEUROLOGICAL BOWEL NUTRITION STATUS      Incontinent Diet (Dysphagia 3, thin liquids, 1500 ml fluid restriction)  AMBULATORY STATUS COMMUNICATION OF NEEDS Skin   Limited Assist Verbally Other (Comment), Skin abrasions (skin dry/flaky; incontinence associated dermatitis to buttocks, excoriated and reddened; erythema to groin/labia; petchiae bilateral legs)                       Personal Care Assistance Level of Assistance  Bathing, Feeding, Dressing Bathing Assistance: Limited assistance Feeding assistance: Independent Dressing Assistance: Limited assistance     Functional Limitations Info  Sight, Hearing, Speech Sight Info: Impaired Hearing Info: Adequate Speech Info: Adequate    SPECIAL CARE FACTORS FREQUENCY  PT (By licensed PT), OT (By licensed OT)     PT Frequency: 5x/week OT Frequency: 5x/week            Contractures Contractures Info: Not present    Additional Factors Info  Code Status, Allergies Code Status Info: DNR Allergies Info: Celecoxib, Duloxetine Hcl, Tizanidine, Venlafaxine, Effersyllium (Psyllium), Escitalopram, Neurontin (Gabapentin), Tramadol           Current Medications (02/28/2023):  This is the current hospital active medication list Current Facility-Administered Medications  Medication Dose Route Frequency Provider Last Rate Last Admin   (feeding supplement) PROSource Plus liquid 30 mL  30 mL Oral TID BM Kc, Ramesh, MD   30 mL at 02/28/23 1422   acetaminophen (TYLENOL) suppository 650 mg  650 mg Rectal Q6H  PRN Lanae Boast, MD       Or   acetaminophen (TYLENOL) 160 MG/5ML solution 650 mg  650 mg Oral Q6H PRN Kc, Ramesh, MD       apixaban (ELIQUIS) tablet 10 mg  10 mg Oral BID Rhetta Mura, MD   10 mg at 02/28/23 0841   Followed by   Melene Muller ON 03/03/2023] apixaban (ELIQUIS) tablet 5 mg   5 mg Oral BID Rhetta Mura, MD       benzocaine (ORAJEL) 10 % mucosal gel   Mouth/Throat QID PRN Danford, Earl Lites, MD       carvedilol (COREG) tablet 3.125 mg  3.125 mg Oral BID WC Kc, Ramesh, MD       cefadroxil (DURICEF) capsule 1,000 mg  1,000 mg Oral BID Kc, Ramesh, MD   1,000 mg at 02/28/23 0841   Chlorhexidine Gluconate Cloth 2 % PADS 6 each  6 each Topical Daily Bobette Mo, MD   6 each at 02/28/23 1503   dronabinol (MARINOL) capsule 2.5 mg  2.5 mg Oral BID AC Mims, Lauren W, DO   2.5 mg at 02/28/23 1133   feeding supplement (BOOST / RESOURCE BREEZE) liquid 1 Container  1 Container Oral TID BM Rhetta Mura, MD   1 Container at 02/28/23 1422   feeding supplement (OSMOLITE 1.5 CAL) liquid 480 mL  480 mL Per Tube Q24H Rhetta Mura, MD   480 mL at 02/26/23 1752   influenza vaccine adjuvanted (FLUAD) injection 0.5 mL  0.5 mL Intramuscular Tomorrow-1000 Samtani, Jai-Gurmukh, MD       lip balm (CARMEX) ointment   Topical PRN Danford, Earl Lites, MD       liver oil-zinc oxide (DESITIN) 40 % ointment   Topical PRN Luiz Iron, NP   Given at 02/19/23 0447   loperamide (IMODIUM) capsule 4 mg  4 mg Oral QID Rhetta Mura, MD   4 mg at 02/28/23 1422   morphine (MSIR) tablet 7.5 mg  7.5 mg Oral Q4H PRN Kc, Dayna Barker, MD       Oral care mouth rinse  15 mL Mouth Rinse PRN Danford, Earl Lites, MD       Oral care mouth rinse  15 mL Mouth Rinse 4 times per day Alberteen Sam, MD   15 mL at 02/28/23 1615   Oral care mouth rinse  15 mL Mouth Rinse PRN Danford, Earl Lites, MD       Oral care mouth rinse  15 mL Mouth Rinse PRN Kc, Ramesh, MD       Oral care mouth rinse  15 mL Mouth Rinse 4 times per day Lanae Boast, MD   15 mL at 02/28/23 1614   Oral care mouth rinse  15 mL Mouth Rinse PRN Kc, Dayna Barker, MD       pantoprazole (PROTONIX) injection 40 mg  40 mg Intravenous Q24H Rhetta Mura, MD   40 mg at 02/28/23 0841   spironolactone (ALDACTONE)  tablet 50 mg  50 mg Oral Daily Rhetta Mura, MD   50 mg at 02/28/23 0840     Discharge Medications: Please see discharge summary for a list of discharge medications.  Relevant Imaging Results:  Relevant Lab Results:   Additional Information SSN [id number]  Adrian Prows, RN

## 2023-02-28 NOTE — Progress Notes (Signed)
Physical Therapy Treatment Patient Details Name: Anita Price MRN: [id number] DOB: 16-Jul-1955 Today's Date: 02/28/2023   History of Present Illness Patient is a 67 year old female who presented on 11/14 with confusion. Patient was found to have strep intermedius empyema, acute encephalopathy, severe protein calorie malnutrition, and RLL pulmonary emboli. Patient had multiple chest tubes from 11/5 to 11/24.   PMH: Hep C, cirrhosis, chronic pain, anorexia, tobacco use, chronic pain syndrome, ACDF c3-c6    PT Comments  General Comments: quick to say "no".  HIGH ANXIETY  Pt found in bed incont BM.  Daughter present during session.  Helpful to encourage pt to participate.  General bed mobility comments: Max encouragement due to HIGH ANXIETY and c/o pain "all over".  Pt found to be incont of BM. General transfer comment: pt required + 2 Mod/Max Assist to transfer from bed to [facility] then from [facility] to walker.  VERY unsteady/fearful with increased c/o pain B feet when standing. General Gait Details: Pt required + 2 side by side asisst to amb a few feet from [facility] to recliner.  HIGH ANXIETY.  Unsteady. Pt will need ST Rehab at SNF to address mobility and functional decline prior to safely returning home.    If plan is discharge home, recommend the following: Two people to help with walking and/or transfers;A lot of help with bathing/dressing/bathroom;Assist for transportation;Assistance with cooking/housework;Help with stairs or ramp for entrance   Can travel by private vehicle     No  Equipment Recommendations       Recommendations for Other Services       Precautions / Restrictions Precautions Precautions: Fall Precaution Comments: cervical precautions(H/O ACDF), cortrack Restrictions Weight Bearing Restrictions: No     Mobility  Bed Mobility Overal bed mobility: Needs Assistance Bed Mobility: Supine to Sit     Supine to sit: Mod assist, Max assist     General bed mobility comments: Max  encouragement due to HIGH ANXIETY and c/o pain "all over".  Pt found to be incont of BM.    Transfers Overall transfer level: Needs assistance Equipment used: Rolling walker (2 wheels) Transfers: Sit to/from Stand Sit to Stand: Mod assist, Max assist, +2 safety/equipment, +2 physical assistance           General transfer comment: pt required + 2 Mod/Max Assist to transfer from bed to [facility] then from [facility] to walker.  VERY unsteady/fearful with increased c/o pain B feet when standing.    Ambulation/Gait Ambulation/Gait assistance: Max assist, +2 physical assistance, +2 safety/equipment Gait Distance (Feet): 2 Feet Assistive device: Rolling walker (2 wheels) Gait Pattern/deviations: Step-to pattern Gait velocity: decreased     General Gait Details: Pt required + 2 side by side asisst to amb a few feet from [facility] to recliner.  HIGH ANXIETY.  Unsteady.   Stairs             Wheelchair Mobility     Tilt Bed    Modified Rankin (Stroke Patients Only)       Balance                                            Cognition Arousal: Alert Behavior During Therapy: WFL for tasks assessed/performed, Anxious  General Comments: quick to say "no".  HIGH ANXIETY  Pt found in bed incont BM        Exercises      General Comments        Pertinent Vitals/Pain Pain Assessment Pain Assessment: 0-10 Pain Score: 10-Worst pain ever Pain Location: "all over" her back during supine to sit and her feet during standing Pain Descriptors / Indicators: Discomfort, Grimacing, Guarding, Sharp, Shooting Pain Intervention(s): Monitored during session    Home Living                          Prior Function            PT Goals (current goals can now be found in the care plan section) Progress towards PT goals: Progressing toward goals    Frequency    Min 1X/week      PT Plan      Co-evaluation               AM-PAC PT "6 Clicks" Mobility   Outcome Measure  Help needed turning from your back to your side while in a flat bed without using bedrails?: A Lot Help needed moving from lying on your back to sitting on the side of a flat bed without using bedrails?: A Lot Help needed moving to and from a bed to a chair (including a wheelchair)?: A Lot Help needed standing up from a chair using your arms (e.g., wheelchair or bedside chair)?: A Lot Help needed to walk in hospital room?: A Lot Help needed climbing 3-5 steps with a railing? : Total 6 Click Score: 11    End of Session Equipment Utilized During Treatment: Gait belt Activity Tolerance: Patient limited by fatigue Patient left: in chair;with call bell/phone within reach;with family/visitor present;with chair alarm set Nurse Communication: Mobility status PT Visit Diagnosis: Muscle weakness (generalized) (M62.81);Difficulty in walking, not elsewhere classified (R26.2)     Time: 1444-1500 PT Time Calculation (min) (ACUTE ONLY): 16 min  Charges:    $Therapeutic Activity: 8-22 mins PT General Charges $$ ACUTE PT VISIT: 1 Visit                     Felecia Shelling  PTA Acute  Rehabilitation Services Office M-F          [phone]

## 2023-02-28 NOTE — Progress Notes (Signed)
CBG result is 163 at 0407. Patient is asymptomatic. Notified the provider Anthoney Harada, NP per order parameters. No new orders. Will continue to monitor.

## 2023-03-01 DIAGNOSIS — A419 Sepsis, unspecified organism: Secondary | ICD-10-CM | POA: Diagnosis not present

## 2023-03-01 LAB — GLUCOSE, CAPILLARY
Glucose-Capillary: 103 mg/dL — ABNORMAL HIGH (ref 70–99)
Glucose-Capillary: 108 mg/dL — ABNORMAL HIGH (ref 70–99)
Glucose-Capillary: 157 mg/dL — ABNORMAL HIGH (ref 70–99)
Glucose-Capillary: 86 mg/dL (ref 70–99)
Glucose-Capillary: 86 mg/dL (ref 70–99)
Glucose-Capillary: 87 mg/dL (ref 70–99)
Glucose-Capillary: 97 mg/dL (ref 70–99)

## 2023-03-01 MED ORDER — PANTOPRAZOLE SODIUM 40 MG PO TBEC
40.0000 mg | DELAYED_RELEASE_TABLET | Freq: Every day | ORAL | Status: DC
  Administered 2023-03-02 – 2023-03-06 (×5): 40 mg via ORAL
  Filled 2023-03-01 (×5): qty 1

## 2023-03-01 NOTE — Plan of Care (Signed)
  Problem: Health Behavior/Discharge Planning: Goal: Ability to manage health-related needs will improve Outcome: Progressing   Problem: Coping: Goal: Level of anxiety will decrease Outcome: Progressing   Problem: Elimination: Goal: Will not experience complications related to urinary retention Outcome: Progressing

## 2023-03-01 NOTE — Progress Notes (Signed)
PHARMACIST - PHYSICIAN COMMUNICATION  DR:  Jonathon Bellows  CONCERNING: IV to Oral Route Change Policy  RECOMMENDATION: This patient is receiving Pantoprazole by the intravenous route.  Based on criteria approved by the Pharmacy and Therapeutics Committee, the intravenous medication(s) is/are being converted to the equivalent oral dose form(s).   DESCRIPTION: These criteria include: The patient is eating (either orally or via tube) and/or has been taking other orally administered medications for a least 24 hours The patient has no evidence of active gastrointestinal bleeding or impaired GI absorption (gastrectomy, short bowel, patient on TNA or NPO).  If you have questions about this conversion, please contact the Pharmacy Department  []   [phone] )  [facility] []   [phone] )  [facility] []   [phone] )  [facility] []   [phone] )  [facility] [x]   [phone] )  [facility]   Greer Pickerel [place], [facility] 03/01/2023 11:40 AM

## 2023-03-01 NOTE — Progress Notes (Signed)
PROGRESS NOTE Anita Price  WUJ:[id number] DOB: 06/15/1955 DOA: 02/13/2023 PCP: Jackie Plum, MD  Brief Narrative/Hospital Course: 67 year old female with chronic medical history of Chronic hepatitis C/liver cirrhosis which has been untreated in the past--underlying cirrhosis, tobacco abuse,  Chronic pain syndrome due to chronic back pain--- underlying cervical radiculopathy status post C3-C6 ACDF Dr. Yevette Edwards--- follows with pain clinic in [place] is debating spinal cord stimulator trial, Degenerative joint disease, HTN,previous thyroid nodule, brought to the ED 11/14 for generalized weakness, anorexia nausea and vomiting multiple times x 3 to 4 days PTA. In the ED arousable but confused workup revealed large loculated left pleural effusion endometrial thickening splenic infarcts Patient was admitted on antibiotics underwent thoracentesis pulmonary IR consult with prolonged hospitalization and multiple procedure as below:   11/14: Admitted on antibiotics, thoracentesis with purulent material 11/15: Pulmonology consulted, chest tube inserted 11/16: Breathing worse; Up to 35L HFNC; Pleural Lytics given 11/18: Second chest tube placed by IR 11/19: Dobbhoff Cortrak placed--- palliative medicine consulted additionally for goals of care--remains full code. 11/22: Lower right-sided chest tube removed 11/23: all chest tubes removed 11/23: abd paracentesis done 450 cc  11/28: Doing well oriented NG tube feeding, FMS and Foley catheter discontinued  Significant studies: 11/14 CT head: NAD 11/14 CT chest: large left pleural effusion 11/15 Echo: Normal EF, normal valves 11/18 CT chest repeat moderate large volume left pleural effusion 20 cm greatest dimension, left base pigtail catheter drainage subscribe pragmatic right basilar nodular pulmonary opacities multifocal pneumonia 11/22 CT chest = substantial improvement left empyema lateral component completely evacuated residue 2 loculations 2.8  X3.9, 2.4 X3.1 11/22 CT abdomen pelvis cirrhosis liver portal hypertension gastrorenal shunt distal varices moderate ascites anasarca-probable splenic infarct?  Laceration seems stable from prior with no extravasation?  Subsegmental pulmonary embolus posterior basilar right lower lobe 11/20 repeat CT chest with contrast shows right lower lobe segmental and subsegmental emboli no heart strain loculated effusion left with pleural thickening left lower lobe atelectasis small right pleural effusion with patchy airspace disease and cirrhosis?  Splenic infarct again seen  Subjective: Seen and examined Doing well and wants to go home eating fairly okay Overnight afebrile BP stable blood sugar  normal She has been tolerating diet and her from 25 to 50% and taking supplements Having multiple Bms still  Assessment and Plan: Principal Problem:   Sepsis due to Streptococcus intermedius empyema (HCC) Active Problems:   Empyema (HCC)   Acute metabolic encephalopathy   Essential hypertension   Tobacco use   Hyponatremia   Prolonged QT interval   Hepatic cirrhosis in setting of untreated hepatitis C    AKI (acute kidney injury) (HCC)   Abnormal ultrasound of uterus   Hypokalemia   Uncomplicated opioid dependence (HCC)   Thrombocytopenia (HCC)   Hypophosphatemia   Lactic acidosis   Malnutrition of moderate degree   Palliative care encounter   Goals of care, counseling/discussion   Counseling and coordination of care   Need for emotional support   Sepsis (HCC)   DNR (do not resuscitate) discussion   Poor appetite   Medication management   Streptococcus intermedius empyema on left Leaking chest tube site LLL- ostomy in place now Sepsis POA due to empyema: S/p multiple procedures including thoracentesis 11/14 with milky white output> left lower chest tube initially 11/15  s/p lytics, additional chest tube placed 11/18 and left lower chest tube removed 11/22 and left anterior chest tube in  02/23/23. Small effusion present but will likely self resolve or a scar  down- has ostomy bag placed at the site of previous chest tube drainage and appears dry-continue wound care. PCCM signed off. Initially on cefazolin> changed to cefadroxil -dose adjusted 11/28> EOT 03/05/23.  Continue supplemental oxygen, incentive spirometry PT OT and monitor left lateral chest tube drain site. 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  Mentation has been stable aaoand pleasant. Cont to mobilize with PT OT  Diarrhea: While on tube feeding. Off FMS. Continue Imodium scheduled- previously ID was consulted and discussed AND C DIFF TESTING DEFERRED.  Dysphagia: SLP following continue DOS 3 diet.  Tolerating. 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  Metabolic acidosis:improved Hyponatremia : low but stable encourage oral intake  Hypokalemia resolved Hypoalbuminemia:Augment nutrition   Impaired glucose tolerance likely secondary to feeds-no workup unless above 180   Chronic pain syndrome from ACDF : Follows with pain clinic.  Continue on pain control MS IR 7.5 mg q 4 hr prn-Home dose 30 every 8, hold high doses for now Stopped--Lyrica , Flexeril 10 at bedtime and MS Contin 15 every 12   Degenerative joint disease NOS   Endometrial thickening Needs outpatient characterization not emergently   Thyroid nodule Needs outpatient characterization not emergently  Failure to thrive/debility/deconditioning Palliative care already  following.  Continue core track diet Overall prognosis does not appear bright.  Moderate malnutrition: Completing calorie count today. Nutrition Status: Nutrition Problem: Moderate Malnutrition Etiology: chronic illness Signs/Symptoms: moderate fat depletion, severe muscle depletion Interventions: Refer to RD note for recommendations, Boost Breeze, Tube feeding  DVT prophylaxis: SCDs Start: 02/13/23 1735 Eliquis Code Status:   Code Status: Limited: Do not attempt resuscitation (DNR) -DNR-LIMITED -Do Not Intubate/DNI  Family Communication: plan of care discussed with patient/NO FAMILY at bedside. Called to udpated her daughter but no answer 11/28. Was able to update daughter 11/29 and agrees for SNF [facility] Patient status is: Inpatient because of encephalopathy and malnutrition Level of care: Progressive   Dispo: The patient is from: HOME, with daughter            Anticipated disposition: SNF once bed available .she is medically stable  Objective: Vitals last 24 hrs: Vitals:   02/28/23 1319 02/28/23 2022 03/01/23 0428 03/01/23 0500  BP: 118/79 116/72 127/76   Pulse: 97 96 88   Resp: 14 20 15    Temp: 97.9 F (36.6 C) 98.1 F (36.7 C) 97.9 F (36.6 C)   TempSrc: Oral Oral Oral   SpO2: 100% 95% 98%   Weight:    46.9 kg  Height:       Weight change: -3.3 kg  Physical Examination: General exam: alert awake, oriented , thin frail appearing  HEENT:Oral mucosa moist, Ear/Nose WNL grossly. LLL chest wall tube site w/ ostomy bag- dry Respiratory system: Bilaterally clear BS,no use of accessory muscle Cardiovascular system: S1 & S2 +, No JVD. Gastrointestinal system: Abdomen soft,NT,ND, BS+ Nervous System: Alert, awake, moving all extremities,and following commands. Extremities: LE edema neg,distal peripheral pulses palpable and warm.  Skin: No rashes,no icterus. MSK: Normal muscle bulk,tone, power   Medications reviewed:  Scheduled Meds:  (feeding supplement) PROSource Plus  30 mL Oral TID BM   apixaban  10 mg Oral BID   Followed by   Melene Muller ON 03/03/2023] apixaban  5 mg Oral BID   carvedilol  3.125 mg Oral BID WC   cefadroxil  1,000 mg Oral BID   Chlorhexidine Gluconate Cloth  6 each Topical Daily   dronabinol  2.5 mg Oral BID AC   feeding supplement  1 Container Oral TID BM   influenza vaccine adjuvanted  0.5 mL Intramuscular Tomorrow-1000   loperamide  4 mg Oral QID   mouth rinse  15 mL Mouth Rinse 4 times per day   mouth rinse  15 mL Mouth Rinse 4 times per day   pantoprazole (PROTONIX) IV  40 mg Intravenous Q24H   spironolactone  50 mg Oral Daily  Continuous Infusions:   Diet Order             DIET DYS 3 Room service appropriate? Yes; Fluid consistency: Thin; Fluid restriction: 1500 mL Fluid  Diet effective now                   Intake/Output Summary (Last 24 hours) at 03/01/2023 1019 Last data filed at 03/01/2023 0958 Gross per 24 hour  Intake 390 ml  Output 1 ml  Net 389 ml   Net IO Since Admission: 1,741.39 mL [03/01/23 1019]  Wt Readings from Last 3 Encounters:  03/01/23 46.9 kg  06/30/21 49.9 kg  07/10/20 54.4 kg     Unresulted Labs (From admission, onward)     Start     Ordered   02/27/23 0600  Comprehensive metabolic panel  Every Mon,Thu,   R     Question:  Specimen collection method  Answer:  Lab=Lab collect   02/25/23 1750   02/27/23 0600  CBC  Every Mon,Thu,   R     Question:  Specimen collection method  Answer:  Lab=Lab collect   02/25/23 1750   02/22/23 1328  Acid Fast Culture with reflexed sensitivities  RELEASE UPON ORDERING,   TIMED        02/22/23 1328   02/14/23 0500  CBC with Differential  Daily,   R,   Status:  Canceled      02/13/23 1733          Data Reviewed: I have personally reviewed following labs and imaging studies CBC: Recent Labs  Lab 02/23/23 0305 02/24/23 0310 02/25/23 0454 02/27/23 0531  WBC 10.0 9.6 7.4 6.1  HGB 10.2* 10.6* 10.1* 10.2*  HCT 31.7* 31.8* 30.1* 31.3*  MCV 94.3 93.5  92.3 95.1  PLT 104* 122* 145* 190   Basic Metabolic Panel:  Recent Labs  Lab 02/23/23 0305 02/24/23 0809 02/25/23 0454 02/27/23 0531  NA 127* 130* 130* 130*  K 3.5 3.3* 3.8 4.1  CL 103 102 102 102  CO2 20* 21* 23 23  GLUCOSE 113* 131* 92 108*  BUN 16 14 11 13   CREATININE 0.47 0.45 0.43* 0.42*  CALCIUM 7.0* 7.4* 7.4* 7.4*   GFR: Estimated Creatinine Clearance: 51.2 mL/min (A) (by C-G formula based on SCr of 0.42 mg/dL (L)). Liver Function Tests:  Recent Labs  Lab 02/23/23 0305 02/25/23 0454 02/27/23 0531  AST 60* 45* 57*  ALT 15 12 19   ALKPHOS 131* 135* 141*  BILITOT 0.6 0.6 0.6  PROT 5.3* 5.5* 5.6*  ALBUMIN 1.6* 1.8* 1.8*   No results for input(s): "LIPASE", "AMYLASE" in the last 168 hours. No results for input(s): "AMMONIA" in the last 168 hours.  Coagulation Profile:  No results for input(s): "INR", "PROTIME" in the last 168 hours.  Recent Labs  Lab 02/28/23 1708 02/28/23 2020 03/01/23 0011 03/01/23 0425 03/01/23 0725  GLUCAP 159* 133* 108* 86 103*   Recent Results (from the past 240 hour(s))  Aerobic/Anaerobic Culture w Gram Stain (surgical/deep wound)     Status: None   Collection Time: 02/22/23  1:26 PM   Specimen: PATH Cytology Peritoneal fluid  Result Value Ref Range Status   Specimen Description   Final    PERITONEAL Performed at [facility], [address], [place], [place] [address]    Special Requests   Final    NONE Performed at [facility], [address] [place], [place] [address]    Gram Stain   Final    CYTOSPIN SMEAR WBC PRESENT,BOTH PMN AND MONONUCLEAR NO ORGANISMS SEEN    Culture   Final    No growth aerobically or anaerobically. Performed at [facility], [address] [place], [place] [address]    Report Status 02/27/2023 FINAL  Final  Acid Fast Smear (AFB)     Status: None   Collection Time: 02/22/23  1:26 PM   Specimen: PATH Cytology Peritoneal fluid  Result Value Ref Range  Status   AFB Specimen Processing Concentration  Final   Acid Fast Smear Negative  Final    Comment: (NOTE) Performed At: [facility] [address] [place], [place] [id number] Jolene Schimke MD [id number]    Source (AFB) PERITONEAL  Final    Comment: Performed at [facility], [address] [place], [place] [address]  Culture, body fluid w Gram Stain-bottle     Status: None   Collection Time: 02/22/23  1:26 PM   Specimen: Peritoneal Washings  Result Value Ref Range Status   Specimen Description PERITONEAL  Final   Special Requests NONE  Final   Gram Stain   Final    CYTOSPIN SMEAR WBC PRESENT,BOTH PMN AND MONONUCLEAR NO ORGANISMS SEEN    Culture   Final    NO GROWTH 5 DAYS Performed at [facility], [address] [place], [place] [address]    Report Status 02/27/2023 FINAL  Final  Gram stain     Status: None   Collection Time: 02/22/23  1:26 PM   Specimen: Peritoneal Washings  Result Value Ref Range Status   Specimen Description PERITONEAL  Final   Special Requests NONE  Final   Gram Stain   Final    NO ORGANISMS SEEN WBC PRESENT, PREDOMINANTLY PMN CYTOSPIN SMEAR Performed at [facility], [address], [place], [place] [address]    Report Status 02/23/2023 FINAL  Final    Antimicrobials: Anti-infectives (From admission, onward)    Start     Dose/Rate Route Frequency Ordered Stop   02/26/23 2200  cefadroxil (DURICEF) capsule 1,000 mg        1,000 mg Oral 2 times daily 02/26/23 1444     02/24/23 2200  cefadroxil (DURICEF) capsule 500 mg  Status:  Discontinued        500 mg Oral 2 times daily 02/24/23 1730 02/26/23 1444   02/16/23 0930  ceFAZolin (ANCEF) IVPB 2g/100 mL premix  Status:  Discontinued        2 g 200 mL/hr over 30 Minutes Intravenous Every 8 hours 02/16/23 0843 02/24/23 1730   02/15/23 1800  vancomycin (VANCOCIN) IVPB 1000 mg/200 mL premix  Status:  Discontinued  1,000 mg 200 mL/hr over 60 Minutes  Intravenous Every 48 hours 02/13/23 1754 02/14/23 1437   02/15/23 1000  vancomycin (VANCOREADY) IVPB 750 mg/150 mL  Status:  Discontinued        750 mg 150 mL/hr over 60 Minutes Intravenous Daily 02/14/23 1437 02/16/23 0843   02/14/23 1400  vancomycin (VANCOREADY) IVPB 750 mg/150 mL        750 mg 150 mL/hr over 60 Minutes Intravenous  Once 02/14/23 1122 02/14/23 1501   02/14/23 1000  rifaximin (XIFAXAN) tablet 550 mg  Status:  Discontinued        550 mg Oral 3 times daily 02/14/23 0852 02/14/23 0855   02/14/23 1000  rifaximin (XIFAXAN) tablet 550 mg  Status:  Discontinued        550 mg Oral 2 times daily 02/14/23 0855 02/18/23 1438   02/13/23 2300  piperacillin-tazobactam (ZOSYN) IVPB 3.375 g  Status:  Discontinued        3.375 g 12.5 mL/hr over 240 Minutes Intravenous Every 8 hours 02/13/23 1803 02/16/23 0843   02/13/23 2200  metroNIDAZOLE (FLAGYL) IVPB 500 mg  Status:  Discontinued        500 mg 100 mL/hr over 60 Minutes Intravenous Every 12 hours 02/13/23 1733 02/13/23 1802   02/13/23 2000  ceFEPIme (MAXIPIME) 2 g in sodium chloride 0.9 % 100 mL IVPB  Status:  Discontinued        2 g 200 mL/hr over 30 Minutes Intravenous Every 8 hours 02/13/23 1733 02/13/23 1749   02/13/23 1800  vancomycin (VANCOCIN) IVPB 1000 mg/200 mL premix  Status:  Discontinued        1,000 mg 200 mL/hr over 60 Minutes Intravenous  Once 02/13/23 1745 02/13/23 1802   02/13/23 1100  ceFEPIme (MAXIPIME) 2 g in sodium chloride 0.9 % 100 mL IVPB        2 g 200 mL/hr over 30 Minutes Intravenous  Once 02/13/23 1052 02/13/23 1235   02/13/23 1100  metroNIDAZOLE (FLAGYL) IVPB 500 mg        500 mg 100 mL/hr over 60 Minutes Intravenous  Once 02/13/23 1052 02/13/23 1235   02/13/23 1100  vancomycin (VANCOCIN) IVPB 1000 mg/200 mL premix        1,000 mg 200 mL/hr over 60 Minutes Intravenous  Once 02/13/23 1052 02/13/23 1355      Culture/Microbiology    Component Value Date/Time   SDES  02/22/2023 1326     PERITONEAL Performed at [facility], [address], [place], [place] [address]    SDES PERITONEAL 02/22/2023 1326   SDES PERITONEAL 02/22/2023 1326   SPECREQUEST  02/22/2023 1326    NONE Performed at [facility], [address] [place], [place] [address]    SPECREQUEST NONE 02/22/2023 1326   SPECREQUEST NONE 02/22/2023 1326   CULT  02/22/2023 1326    No growth aerobically or anaerobically. Performed at [facility], [address], [place], [place] [address]    CULT  02/22/2023 1326    NO GROWTH 5 DAYS Performed at [facility], [address], [place], [place] [address]    REPTSTATUS 02/27/2023 FINAL 02/22/2023 1326   REPTSTATUS 02/27/2023 FINAL 02/22/2023 1326   REPTSTATUS 02/23/2023 FINAL 02/22/2023 1326     Radiology Studies: No results found.   LOS: 16 days   Total time spent in review of labs and imaging, patient evaluation, formulation of plan, documentation and communication with family: 35 minutes  Lanae Boast,  MD [facility]  03/01/2023, 10:19 AM

## 2023-03-02 DIAGNOSIS — A419 Sepsis, unspecified organism: Secondary | ICD-10-CM | POA: Diagnosis not present

## 2023-03-02 LAB — COMPREHENSIVE METABOLIC PANEL
ALT: 25 U/L (ref 0–44)
AST: 50 U/L — ABNORMAL HIGH (ref 15–41)
Albumin: 1.8 g/dL — ABNORMAL LOW (ref 3.5–5.0)
Alkaline Phosphatase: 145 U/L — ABNORMAL HIGH (ref 38–126)
Anion gap: 7 (ref 5–15)
BUN: 12 mg/dL (ref 8–23)
CO2: 21 mmol/L — ABNORMAL LOW (ref 22–32)
Calcium: 7.5 mg/dL — ABNORMAL LOW (ref 8.9–10.3)
Chloride: 101 mmol/L (ref 98–111)
Creatinine, Ser: 0.43 mg/dL — ABNORMAL LOW (ref 0.44–1.00)
GFR, Estimated: >60 mL/min (ref >60)
Glucose, Bld: 94 mg/dL (ref 70–99)
Potassium: 3.6 mmol/L (ref 3.5–5.1)
Sodium: 129 mmol/L — ABNORMAL LOW (ref 135–145)
Total Bilirubin: 0.8 mg/dL (ref <1.2)
Total Protein: 5.7 g/dL — ABNORMAL LOW (ref 6.5–8.1)

## 2023-03-02 LAB — CBC
HCT: 32.7 % — ABNORMAL LOW (ref 36.0–46.0)
Hemoglobin: 10.2 g/dL — ABNORMAL LOW (ref 12.0–15.0)
MCH: 30.8 pg (ref 26.0–34.0)
MCHC: 31.2 g/dL (ref 30.0–36.0)
MCV: 98.8 fL (ref 80.0–100.0)
Platelets: 274 10*3/uL (ref 150–400)
RBC: 3.31 MIL/uL — ABNORMAL LOW (ref 3.87–5.11)
RDW: 19.9 % — ABNORMAL HIGH (ref 11.5–15.5)
WBC: 8.1 10*3/uL (ref 4.0–10.5)
nRBC: 0 % (ref 0.0–0.2)

## 2023-03-02 LAB — GLUCOSE, CAPILLARY
Glucose-Capillary: 94 mg/dL (ref 70–99)
Glucose-Capillary: 105 mg/dL — ABNORMAL HIGH (ref 70–99)
Glucose-Capillary: 115 mg/dL — ABNORMAL HIGH (ref 70–99)
Glucose-Capillary: 112 mg/dL — ABNORMAL HIGH (ref 70–99)
Glucose-Capillary: 76 mg/dL (ref 70–99)

## 2023-03-02 MED ORDER — LOPERAMIDE HCL 2 MG PO CAPS
4.0000 mg | ORAL_CAPSULE | ORAL | Status: DC | PRN
  Administered 2023-03-04: 4 mg via ORAL
  Filled 2023-03-02: qty 2

## 2023-03-02 NOTE — Progress Notes (Signed)
PROGRESS NOTE Anita Price  ZOX:[id number] DOB: Oct 28, 1955 DOA: 02/13/2023 PCP: Jackie Plum, MD  Brief Narrative/Hospital Course: 67 year old female with chronic medical history of Chronic hepatitis C/liver cirrhosis which has been untreated in the past--underlying cirrhosis, tobacco abuse,  Chronic pain syndrome due to chronic back pain--- underlying cervical radiculopathy status post C3-C6 ACDF Dr. Yevette Edwards--- follows with pain clinic in [place] is debating spinal cord stimulator trial, Degenerative joint disease, HTN,previous thyroid nodule, brought to the ED 11/14 for generalized weakness, anorexia nausea and vomiting multiple times x 3 to 4 days PTA. In the ED arousable but confused workup revealed large loculated left pleural effusion endometrial thickening splenic infarcts Patient was admitted on antibiotics underwent thoracentesis pulmonary IR consult with prolonged hospitalization and multiple procedure as below:   11/14: Admitted on antibiotics, thoracentesis with purulent material 11/15: Pulmonology consulted, chest tube inserted 11/16: Breathing worse; Up to 35L HFNC; Pleural Lytics given 11/18: Second chest tube placed by IR 11/19: Dobbhoff Cortrak placed--- palliative medicine consulted additionally for goals of care--remains full code. 11/22: Lower right-sided chest tube removed 11/23: all chest tubes removed 11/23: abd paracentesis done 450 cc  11/28: Doing well oriented NG tube feeding, FMS and Foley catheter discontinued  Significant studies: 11/14 CT head: NAD 11/14 CT chest: large left pleural effusion 11/15 Echo: Normal EF, normal valves 11/18 CT chest repeat moderate large volume left pleural effusion 20 cm greatest dimension, left base pigtail catheter drainage subscribe pragmatic right basilar nodular pulmonary opacities multifocal pneumonia 11/22 CT chest = substantial improvement left empyema lateral component completely evacuated residue 2 loculations 2.8  X3.9, 2.4 X3.1 11/22 CT abdomen pelvis cirrhosis liver portal hypertension gastrorenal shunt distal varices moderate ascites anasarca-probable splenic infarct?  Laceration seems stable from prior with no extravasation?  Subsegmental pulmonary embolus posterior basilar right lower lobe 11/20 repeat CT chest with contrast shows right lower lobe segmental and subsegmental emboli no heart strain loculated effusion left with pleural thickening left lower lobe atelectasis small right pleural effusion with patchy airspace disease and cirrhosis?  Splenic infarct again seen  Patient has been tolerating diet remains medically stable awaiting for skilled nursing facility at this time [facility] awaiting for bed offers  Subjective: Seen and examined Appears alert awake but frail No complaints.  Reports she wants to go home and has spoken to the daughter Overnight afebrile BP stable on room air  Assessment and Plan: Principal Problem:   Sepsis due to Streptococcus intermedius empyema (HCC) Active Problems:   Empyema (HCC)   Acute metabolic encephalopathy   Essential hypertension   Tobacco use   Hyponatremia   Prolonged QT interval   Hepatic cirrhosis in setting of untreated hepatitis C    AKI (acute kidney injury) (HCC)   Abnormal ultrasound of uterus   Hypokalemia   Uncomplicated opioid dependence (HCC)   Thrombocytopenia (HCC)   Hypophosphatemia   Lactic acidosis   Malnutrition of moderate degree   Palliative care encounter   Goals of care, counseling/discussion   Counseling and coordination of care   Need for emotional support   Sepsis (HCC)   DNR (do not resuscitate) discussion   Poor appetite   Medication management   Streptococcus intermedius empyema on left Leaking chest tube site LLL- ostomy in place now Sepsis POA due to empyema: S/p multiple procedures including thoracentesis 11/14 with milky white output> left lower chest tube initially 11/15  s/p lytics, additional chest tube  placed 11/18 and left lower chest tube removed 11/22 and left anterior chest tube  in 02/23/23.Small effusion present but will likely self resolve or a scar down- has ostomy bag placed at the site of previous LLchest tube  and is dry, stitch+ Wound care eval pending.  Initially on cefazolin> changed to cefadroxil -dose adjusted 11/28> EOT 03/05/23.  Encourage mobility as she remains very deconditioned and weak.  RLL pulmonary emboli: No acute issues, continue Eliquis   Acute metabolic encephalopathy History hepatoencephalopathy: She appears weak and frail, mental status improved at times mild confusion but stable  multifactorial due to sepsis, on chronic opiates.Unlikely hepatic encephalopathy Cont to mobilize with PT OT  Diarrhea: While on tube feeding. Off FMS. Resolved. Changed imodium to prn.. previously ID was consulted and discussed AND C DIFF TESTING DEFERRED.  Dysphagia: SLP following continue DOS 3 diet.  Tolerating. 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  Metabolic acidosis:improved Hyponatremia : low but stable encourage oral intake  Hypokalemia resolved Hypoalbuminemia:Augment nutrition   Impaired glucose tolerance likely secondary to feeds-no workup unless above 180   Chronic pain syndrome from ACDF : Follows with pain clinic.  Continue on pain control MS IR 7.5 mg q 4 hr prn-Home dose 30 every 8, hold high doses for now Stopped--Lyrica , Flexeril 10 at bedtime and MS Contin 15 every 12   Degenerative joint disease NOS   Endometrial thickening Needs outpatient characterization not emergently   Thyroid nodule Needs outpatient characterization not emergently  Failure to thrive/debility/deconditioning Palliative care already following.  Continue core track  diet Overall prognosis does not appear bright.  Moderate malnutrition: Completing calorie count today. Nutrition Status: Nutrition Problem: Moderate Malnutrition Etiology: chronic illness Signs/Symptoms: moderate fat depletion, severe muscle depletion Interventions: Refer to RD note for recommendations, Boost Breeze, Tube feeding  DVT prophylaxis: SCDs Start: 02/13/23 1735 Eliquis Code Status:   Code Status: Limited: Do not attempt resuscitation (DNR) -DNR-LIMITED -Do Not Intubate/DNI  Family Communication: plan of care discussed with patient/NO FAMILY at bedside. Called to udpated her daughter but no answer 11/28. Was able to update daughter 11/29 and agrees for SNF [facility] AWARE Patient status is: Inpatient because of encephalopathy and malnutrition Level of care: Progressive   Dispo: The patient is from: HOME, with daughter            Anticipated disposition: SNF vs HH.  TOC has been informed, patient adamant on going home instead of rehab but she is very weak and deconditioned/discussed with nursing staff and TOC.  Anticipate discharge 1-2 days.  Objective: Vitals last 24 hrs: Vitals:   03/01/23 2002 03/02/23 0403 03/02/23 0412 03/02/23 0803  BP: 110/65  111/70 125/72  Pulse: 88  96 94  Resp: 17  17 18   Temp: 98.3 F (36.8 C)  98.2 F (36.8 C)   TempSrc: Oral  Oral   SpO2: 90%  100% 95%  Weight:  44.5 kg    Height:       Weight change: -2.4 kg  Physical Examination: General exam: alert awake frail HEENT:Oral mucosa moist, Ear/Nose WNL grossly Respiratory system: Bilaterally clear BS,no use of accessory muscle , LLL chest tube w/  ostomy bag. Cardiovascular system: S1 & S2 +, No JVD. Gastrointestinal system: Abdomen soft,NT,ND, BS+ Nervous System: Alert, awake, moving all extremities,and following commands. Extremities: LE edema neg,distal peripheral pulses palpable and warm.  Skin: No rashes,no icterus. MSK: weak and thin muscle bulk,tone,  power   Medications reviewed:   Scheduled Meds:  (feeding supplement) PROSource Plus  30 mL Oral TID BM   apixaban  10 mg Oral BID   Followed by   Melene Muller ON 03/03/2023] apixaban  5 mg Oral BID   carvedilol  3.125 mg Oral BID WC   cefadroxil  1,000 mg Oral BID   Chlorhexidine Gluconate Cloth  6 each Topical Daily   dronabinol  2.5 mg Oral BID AC   feeding supplement  1 Container Oral TID BM   influenza vaccine adjuvanted  0.5 mL Intramuscular Tomorrow-1000   mouth rinse  15 mL Mouth Rinse 4 times per day   mouth rinse  15 mL Mouth Rinse 4 times per day   pantoprazole  40 mg Oral Daily   spironolactone  50 mg Oral Daily  Continuous Infusions:   Diet Order             DIET DYS 3 Room service appropriate? Yes; Fluid consistency: Thin; Fluid restriction: 1500 mL Fluid  Diet effective now                   Intake/Output Summary (Last 24 hours) at 03/02/2023 1133 Last data filed at 03/02/2023 0447 Gross per 24 hour  Intake 720 ml  Output --  Net 720 ml   Net IO Since Admission: 2,461.39 mL [03/02/23 1133]  Wt Readings from Last 3 Encounters:  03/02/23 44.5 kg  06/30/21 49.9 kg  07/10/20 54.4 kg     Unresulted Labs (From admission, onward)     Start     Ordered   02/27/23 0600  Comprehensive metabolic panel  Every Mon,Thu,   R     Question:  Specimen collection method  Answer:  Lab=Lab collect   02/25/23 1750   02/27/23 0600  CBC  Every Mon,Thu,   R     Question:  Specimen collection method  Answer:  Lab=Lab collect   02/25/23 1750   02/22/23 1328  Acid Fast Culture with reflexed sensitivities  RELEASE UPON ORDERING,   TIMED        02/22/23 1328   02/14/23 0500  CBC with Differential  Daily,   R,   Status:  Canceled      02/13/23 1733          Data Reviewed: I have personally reviewed following labs and imaging studies CBC: Recent Labs  Lab 02/24/23 0310 02/25/23 0454 02/27/23 0531  WBC 9.6 7.4 6.1  HGB 10.6* 10.1* 10.2*  HCT 31.8* 30.1* 31.3*  MCV 93.5 92.3 95.1  PLT 122* 145* 190    Basic Metabolic Panel:  Recent Labs  Lab 02/24/23 0809 02/25/23 0454 02/27/23 0531  NA 130* 130* 130*  K 3.3* 3.8 4.1  CL 102 102 102  CO2 21* 23 23  GLUCOSE 131* 92 108*  BUN 14 11 13   CREATININE 0.45 0.43* 0.42*  CALCIUM 7.4* 7.4* 7.4*   GFR: Estimated Creatinine Clearance: 48.6 mL/min (A) (by C-G formula based on SCr of 0.42 mg/dL (L)). Liver Function Tests:  Recent Labs  Lab 02/25/23 0454 02/27/23 0531  AST 45* 57*  ALT 12 19  ALKPHOS 135* 141*  BILITOT 0.6 0.6  PROT 5.5* 5.6*  ALBUMIN 1.8* 1.8*   No results for input(s): "LIPASE", "AMYLASE" in the last 168 hours. No results for input(s): "AMMONIA" in the last 168 hours. Coagulation Profile:  No results for input(s): "INR", "PROTIME" in the last 168 hours.  Recent Labs  Lab 03/01/23 1710  03/01/23 1935 03/01/23 2354 03/02/23 0354 03/02/23 0736  GLUCAP 86 87 157* 94 105*   Recent Results (from the past 240 hour(s))  Aerobic/Anaerobic Culture w Gram Stain (surgical/deep wound)     Status: None   Collection Time: 02/22/23  1:26 PM   Specimen: PATH Cytology Peritoneal fluid  Result Value Ref Range Status   Specimen Description   Final    PERITONEAL Performed at [facility], [address], [place], [place] [address]    Special Requests   Final    NONE Performed at [facility], [address], [place], [place] [address]    Gram Stain   Final    CYTOSPIN SMEAR WBC PRESENT,BOTH PMN AND MONONUCLEAR NO ORGANISMS SEEN    Culture   Final    No growth aerobically or anaerobically. Performed at [facility], [address], [place], [place] [address]    Report Status 02/27/2023 FINAL  Final  Acid Fast Smear (AFB)     Status: None   Collection Time: 02/22/23  1:26 PM   Specimen: PATH Cytology Peritoneal fluid  Result Value Ref Range Status   AFB Specimen Processing Concentration  Final   Acid Fast Smear Negative  Final    Comment: (NOTE) Performed At: [facility] [address] [place], [place] [id number] Jolene Schimke MD FT:[id number]    Source (AFB) PERITONEAL  Final    Comment: Performed at [facility], [address], [place], [place] [address]  Culture, body fluid w Gram Stain-bottle     Status: None   Collection Time: 02/22/23  1:26 PM   Specimen: Peritoneal Washings  Result Value Ref Range Status   Specimen Description PERITONEAL  Final   Special Requests NONE  Final   Gram Stain   Final    CYTOSPIN SMEAR WBC PRESENT,BOTH PMN AND MONONUCLEAR NO ORGANISMS SEEN    Culture   Final    NO GROWTH 5 DAYS Performed at [facility], [address] [place], [place] [address]    Report Status 02/27/2023 FINAL  Final  Gram stain     Status: None   Collection Time: 02/22/23  1:26 PM   Specimen: Peritoneal Washings  Result Value Ref Range Status   Specimen Description PERITONEAL  Final   Special Requests NONE  Final   Gram Stain   Final    NO ORGANISMS SEEN WBC PRESENT, PREDOMINANTLY PMN CYTOSPIN SMEAR Performed at [facility], [address] [place], [place] [address]    Report Status 02/23/2023 FINAL  Final    Antimicrobials: Anti-infectives (From admission, onward)    Start     Dose/Rate Route Frequency Ordered Stop   02/26/23 2200  cefadroxil (DURICEF) capsule 1,000 mg        1,000 mg Oral 2 times daily 02/26/23 1444     02/24/23 2200  cefadroxil (DURICEF) capsule 500 mg  Status:  Discontinued        500 mg Oral 2 times daily 02/24/23 1730 02/26/23 1444   02/16/23 0930  ceFAZolin (ANCEF) IVPB 2g/100 mL premix  Status:  Discontinued        2 g 200 mL/hr over 30 Minutes Intravenous Every 8 hours 02/16/23 0843 02/24/23 1730   02/15/23 1800  vancomycin (VANCOCIN) IVPB 1000 mg/200 mL premix  Status:  Discontinued        1,000 mg 200 mL/hr over 60 Minutes Intravenous Every 48 hours 02/13/23 1754 02/14/23 1437  02/15/23 1000  vancomycin (VANCOREADY) IVPB 750 mg/150 mL  Status:   Discontinued        750 mg 150 mL/hr over 60 Minutes Intravenous Daily 02/14/23 1437 02/16/23 0843   02/14/23 1400  vancomycin (VANCOREADY) IVPB 750 mg/150 mL        750 mg 150 mL/hr over 60 Minutes Intravenous  Once 02/14/23 1122 02/14/23 1501   02/14/23 1000  rifaximin (XIFAXAN) tablet 550 mg  Status:  Discontinued        550 mg Oral 3 times daily 02/14/23 0852 02/14/23 0855   02/14/23 1000  rifaximin (XIFAXAN) tablet 550 mg  Status:  Discontinued        550 mg Oral 2 times daily 02/14/23 0855 02/18/23 1438   02/13/23 2300  piperacillin-tazobactam (ZOSYN) IVPB 3.375 g  Status:  Discontinued        3.375 g 12.5 mL/hr over 240 Minutes Intravenous Every 8 hours 02/13/23 1803 02/16/23 0843   02/13/23 2200  metroNIDAZOLE (FLAGYL) IVPB 500 mg  Status:  Discontinued        500 mg 100 mL/hr over 60 Minutes Intravenous Every 12 hours 02/13/23 1733 02/13/23 1802   02/13/23 2000  ceFEPIme (MAXIPIME) 2 g in sodium chloride 0.9 % 100 mL IVPB  Status:  Discontinued        2 g 200 mL/hr over 30 Minutes Intravenous Every 8 hours 02/13/23 1733 02/13/23 1749   02/13/23 1800  vancomycin (VANCOCIN) IVPB 1000 mg/200 mL premix  Status:  Discontinued        1,000 mg 200 mL/hr over 60 Minutes Intravenous  Once 02/13/23 1745 02/13/23 1802   02/13/23 1100  ceFEPIme (MAXIPIME) 2 g in sodium chloride 0.9 % 100 mL IVPB        2 g 200 mL/hr over 30 Minutes Intravenous  Once 02/13/23 1052 02/13/23 1235   02/13/23 1100  metroNIDAZOLE (FLAGYL) IVPB 500 mg        500 mg 100 mL/hr over 60 Minutes Intravenous  Once 02/13/23 1052 02/13/23 1235   02/13/23 1100  vancomycin (VANCOCIN) IVPB 1000 mg/200 mL premix        1,000 mg 200 mL/hr over 60 Minutes Intravenous  Once 02/13/23 1052 02/13/23 1355      Culture/Microbiology    Component Value Date/Time   SDES  02/22/2023 1326    PERITONEAL Performed at [facility], [address] [place], [place] [address]    SDES PERITONEAL 02/22/2023 1326    SDES PERITONEAL 02/22/2023 1326   SPECREQUEST  02/22/2023 1326    NONE Performed at [facility], [address] [place], [place] [address]    SPECREQUEST NONE 02/22/2023 1326   SPECREQUEST NONE 02/22/2023 1326   CULT  02/22/2023 1326    No growth aerobically or anaerobically. Performed at [facility], [address] [place], [place] [address]    CULT  02/22/2023 1326    NO GROWTH 5 DAYS Performed at [facility], [address], [place], [place] [address]    REPTSTATUS 02/27/2023 FINAL 02/22/2023 1326   REPTSTATUS 02/27/2023 FINAL 02/22/2023 1326   REPTSTATUS 02/23/2023 FINAL 02/22/2023 1326     Radiology Studies: No results found.   LOS: 17 days   Total time spent in review of labs and imaging, patient evaluation, formulation of plan, documentation and communication with family: 35 minutes  Lanae Boast, MD [facility]  03/02/2023, 11:33 AM

## 2023-03-02 NOTE — Plan of Care (Signed)
?  Problem: Coping: ?Goal: Level of anxiety will decrease ?Outcome: Progressing ?  ?Problem: Safety: ?Goal: Ability to remain free from injury will improve ?Outcome: Progressing ?  ?

## 2023-03-03 ENCOUNTER — Inpatient Hospital Stay (HOSPITAL_COMMUNITY): Payer: Medicare Other

## 2023-03-03 DIAGNOSIS — A419 Sepsis, unspecified organism: Secondary | ICD-10-CM | POA: Diagnosis not present

## 2023-03-03 DIAGNOSIS — I2699 Other pulmonary embolism without acute cor pulmonale: Secondary | ICD-10-CM

## 2023-03-03 DIAGNOSIS — I4891 Unspecified atrial fibrillation: Secondary | ICD-10-CM

## 2023-03-03 DIAGNOSIS — B182 Chronic viral hepatitis C: Secondary | ICD-10-CM

## 2023-03-03 LAB — GLUCOSE, CAPILLARY
Glucose-Capillary: 104 mg/dL — ABNORMAL HIGH (ref 70–99)
Glucose-Capillary: 138 mg/dL — ABNORMAL HIGH (ref 70–99)
Glucose-Capillary: 151 mg/dL — ABNORMAL HIGH (ref 70–99)
Glucose-Capillary: 204 mg/dL — ABNORMAL HIGH (ref 70–99)
Glucose-Capillary: 215 mg/dL — ABNORMAL HIGH (ref 70–99)
Glucose-Capillary: 94 mg/dL (ref 70–99)
Glucose-Capillary: 97 mg/dL (ref 70–99)

## 2023-03-03 LAB — PATHOLOGIST SMEAR REVIEW

## 2023-03-03 LAB — TSH: TSH: 0.441 u[IU]/mL (ref 0.350–4.500)

## 2023-03-03 MED ORDER — GERHARDT'S BUTT CREAM
TOPICAL_CREAM | Freq: Three times a day (TID) | CUTANEOUS | Status: DC
  Administered 2023-03-03: 1 via TOPICAL
  Filled 2023-03-03: qty 1

## 2023-03-03 MED ORDER — CARVEDILOL 3.125 MG PO TABS
3.1250 mg | ORAL_TABLET | Freq: Two times a day (BID) | ORAL | Status: DC
  Administered 2023-03-03 (×2): 3.125 mg via ORAL
  Filled 2023-03-03 (×2): qty 1

## 2023-03-03 MED ORDER — METOPROLOL TARTRATE 5 MG/5ML IV SOLN
2.5000 mg | INTRAVENOUS | Status: AC | PRN
  Administered 2023-03-03: 2.5 mg via INTRAVENOUS
  Filled 2023-03-03: qty 5

## 2023-03-03 MED ORDER — PROPRANOLOL HCL 20 MG PO TABS
20.0000 mg | ORAL_TABLET | Freq: Three times a day (TID) | ORAL | Status: DC
  Administered 2023-03-03 – 2023-03-04 (×4): 20 mg via ORAL
  Filled 2023-03-03 (×4): qty 1

## 2023-03-03 MED ORDER — METOPROLOL TARTRATE 5 MG/5ML IV SOLN
2.5000 mg | Freq: Once | INTRAVENOUS | Status: AC | PRN
  Administered 2023-03-03: 2.5 mg via INTRAVENOUS
  Filled 2023-03-03: qty 5

## 2023-03-03 MED ORDER — SODIUM CHLORIDE 0.9 % IV BOLUS
250.0000 mL | Freq: Once | INTRAVENOUS | Status: AC
  Administered 2023-03-03: 250 mL via INTRAVENOUS

## 2023-03-03 MED ORDER — DILTIAZEM HCL 30 MG PO TABS
30.0000 mg | ORAL_TABLET | Freq: Three times a day (TID) | ORAL | Status: DC
  Administered 2023-03-03 – 2023-03-04 (×3): 30 mg via ORAL
  Filled 2023-03-03 (×3): qty 1

## 2023-03-03 NOTE — Plan of Care (Signed)
  Problem: Elimination: Goal: Will not experience complications related to bowel motility Outcome: Progressing Goal: Will not experience complications related to urinary retention Outcome: Progressing   Problem: Safety: Goal: Ability to remain free from injury will improve Outcome: Progressing   

## 2023-03-03 NOTE — Consult Note (Signed)
WOC Nurse Consult Note: Reason for Consult: ostomy and buttocks wounds  Wound type: Moisture Associated Skin Damage  Pressure Injury POA: NA  Measurement: widespread erythema with scattered areas of partial thickness skin loss, what appear to be satellite lesions indicating possible fungal component  Wound bed:  as above  Drainage (amount, consistency, odor) minimal serosanguinous  Periwound: erythema  Dressing procedure/placement/frequency: Cleanse buttocks/sacrum/coccyx with Vashe wound cleanser Hart [place] [phone]), apply Gerhardt's Butt Cream 3 times a day to entire area.  May sprinkle over Gerhardt's with floor stock antifungal powder (Microguard white and green label).    Assessed area of previous L sided chest tube, this area is dry and stitched. Per note appears that bedside staff previously put ostomy pouch on to catch effluent.  Re-consult if further needs arise here.  POC discussed with bedside nurse. WOC team will not follow. Re-consult if further needs arise.   Thank you,     Priscella Mann MSN, RN-BC, [employer] [phone]

## 2023-03-03 NOTE — TOC Progression Note (Addendum)
Transition of Care [facility]) - Progression Note    Patient Details  Name: Anita Price MRN: [id number] Date of Birth: Mar 23, 1956  Transition of Care [facility]) CM/SW Contact  Guy Toney, Olegario Messier, RN Phone Number: 03/03/2023, 10:18 AM  Clinical Narrative: Medically stable for d/c. Melissa(dtr) chose [facility]. [facility] rep Raul-he will check with intake if bed available today. MD updated.  -11:57a-[facility] rep Raul-has bed available when stable for d/c. MD-not medically stable for d/c-tachycardia. Continue to monitor.    Expected Discharge Plan: Skilled Nursing Facility Barriers to Discharge: No Barriers Identified  Expected Discharge Plan and Services In-house Referral: Clinical Social Work   Post Acute Care Choice: Home Health Living arrangements for the past 2 months: Apartment                                       Social Determinants of Health (SDOH) Interventions SDOH Screenings   Food Insecurity: No Food Insecurity (02/14/2023)  Housing: Low Risk  (02/14/2023)  Transportation Needs: No Transportation Needs (02/14/2023)  Utilities: Not At Risk (02/14/2023)  Social Connections: Unknown (08/01/2021)   Received from [facility]  Tobacco Use: High Risk (02/17/2023)    Readmission Risk Interventions    02/26/2023   10:09 AM 02/16/2023    3:49 PM 07/13/2020   11:39 AM  Readmission Risk Prevention Plan  Post Dischage Appt   Not Complete  Appt Comments   attempted to call PCP to schedule f/u- however office informed us that patient would need to call for appointment  Medication Screening   Complete  Transportation Screening Complete Not Complete Complete  Transportation Screening Comment  Patient only alert and orieted x1, unable to complete transportation screening.   PCP or Specialist Appt within 5-7 Days  Not Complete   Not Complete comments  Patient only alert and oriented x1 can't schedule an appt.   PCP or Specialist Appt within 3-5 Days  Complete    Home Care Screening  Not Complete   Home Care Screening Not Completed Comments  Alert and oriented x1 and not medically ready for HH.   Medication Review (RN CM)  Referral to Pharmacy   HRI or Home Care Consult Complete    Social Work Consult for Recovery Care Planning/Counseling Complete    Palliative Care Screening Complete    Medication Review [employer]) Complete

## 2023-03-03 NOTE — Progress Notes (Signed)
PT Cancellation Note  Patient Details Name: Anita Price MRN: [id number] DOB: 03-11-1956   Cancelled Treatment:    per chart review, pt having issue with Tachycardia.  Will hold off for today as she also exhibity HIGH anxiety.  Will attempt to see another day.    Felecia Shelling  PTA Acute  Rehabilitation Services Office M-F          [phone]

## 2023-03-03 NOTE — Progress Notes (Signed)
CCMD called, patient's heart rate is 153, RN checked the patient. Patient is sleep when RN walk in the room, patient denied chest pain, pressure, any discomfort. Patient rate the pain on 7/10. 12-lead EKG performed. Morphine given for pain. RN reach the on-call, 2 L Oxygen given for comfort, and obtain Lopressor order for lower the heart rate. Patient's heart rate 150 after 30 minutes.

## 2023-03-03 NOTE — Consult Note (Addendum)
Cardiology Consultation   Patient ID: JAKERIA DHANRAJ MRN: [id number]; DOB: [birth year]  Admit date: 02/13/2023 Date of Consult: 03/03/2023  PCP:  Jackie Plum, MD   [facility] Providers Cardiologist:  New to Dr Odis Hollingshead    Patient Profile:   Anita Price is a 67 y.o. female with a hx of chronic hepatitis C, liver cirrhosis, tobacco abuse, chronic pain syndrome, hypertension, thyroid nodule , who is being seen 03/03/2023 for the evaluation of tachycardia at the request of Dr. Jonathon Bellows.  History of Present Illness:   Anita Price with above past medical history who has been hospitalized here for 18 days since 02/13/2023.  Anita Price initially came in with generalized weakness, recurrent nausea and vomiting. CT torso on 02/13/2023 showed large loculated left pleural effusion, several splenic infarcts, new thickening of endometrium, liver cirrhosis with mild splenomegaly and multiple venous collaterals near the fundus/cardia of the stomach.  Anita Price has underwent thoracentesis with pleural fluid culture grew Streptococcus intermedius.  Anita Price has required recurrent chest tube insertion and lytic therapy for left sided empyema, high flow nasal cannula oxygen support for hypoxia, and antibiotic for sepsis.  CT repeat 02/21/2023 revealed substantial improvement of left empyema.  CTA PE study 02/21/2023 confirmed right lower lobe segmental and subsegmental PE without right heart strain.  Anita Price was started on Eliquis for anticoagulation. Anita Price underwent paracentesis for ascites and 02/22/2023.  Anita Price had acute metabolic encephalopathy that was felt due to sepsis and opioid use, mentation had overall improved.  Anita Price is currently waiting for SNF.   Anita Price has been tachycardic 110 at baseline, over the past 24 hours, Anita Price was noted having tachycardia up to 150s intermittently telemetry.  Anita Price was given metoprolol and Coreg, with subsequent improvement of heart rate.  Due to recurrent episode of tachycardia at 150s,  cardiology is consulted for further evaluation today.   Upon encounter, patient states Anita Price has never had any cardiac diagnoses in the past, denied any history of MI, CHF, atrial fibrillation.  Anita Price feels significantly tired due to prolonged hospitalization.  Anita Price is not aware of any tachycardic episode, denied any chest pain, shortness of breath, heart palpitation, dizziness, syncope, PND, leg edema.    Lab today revealed hyponatremia 129, bicarb 21, creatinine 0.43, alk phos 145, albumin 1.8, AST 50, ALT 25, total bilirubin 0.8.  CBC with hemoglobin 10.2, platelet 274k. EKG from 03/03/2023 at 4:56 appears to be consistent with atrial flutter with ventricular rate of 152 bpm. Repeat EKG on 03/03/2023 11:14 appears to be consistent with A-fib RVR 138 bpm.  EKG on 03/03/2023 at 11:16 appears to be consistent with sinus tachycardia 116 bpm, PACs and PVC. Echo from 02/14/23 showed LVEF 60 to 65%, no regional motion abnormality, normal diastolic parameter, normal RV, no significant valvular disease.  IVC normal   Past Medical History:  Diagnosis Date   Acute metabolic encephalopathy    Chronic back pain    DJD (degenerative joint disease)    Essential hypertension 07/10/2020   Gallbladder sludge    Hepatitis C 07/10/2020   Liver cirrhosis (HCC)    Pain management    Prolonged QT interval    Protein calorie malnutrition (HCC)    Thyroid nodule    Tobacco use 07/10/2020    Past Surgical History:  Procedure Laterality Date   BACK SURGERY     btl     HERNIA REPAIR     WRIST SURGERY       Home Medications:  Prior to Admission medications  Medication Sig Start Date End Date Taking? Authorizing Provider  morphine (MS CONTIN) 30 MG 12 hr tablet Take 30 mg by mouth every 8 (eight) hours.   Yes [provider]  pregabalin (LYRICA) 300 MG capsule Take 300 mg by mouth in the morning and at bedtime. 01/03/20  Yes [provider]    Inpatient Medications: Scheduled Meds:  (feeding  supplement) PROSource Plus  30 mL Oral TID BM   apixaban  5 mg Oral BID   carvedilol  3.125 mg Oral BID WC   cefadroxil  1,000 mg Oral BID   dronabinol  2.5 mg Oral BID AC   feeding supplement  1 Container Oral TID BM   Gerhardt's butt cream   Topical TID   influenza vaccine adjuvanted  0.5 mL Intramuscular Tomorrow-1000   pantoprazole  40 mg Oral Daily   spironolactone  50 mg Oral Daily   Continuous Infusions:  PRN Meds: acetaminophen **OR** acetaminophen (TYLENOL) oral liquid 160 mg/5 mL, benzocaine, lip balm, loperamide, morphine, mouth rinse  Allergies:    Allergies  Allergen Reactions   Celecoxib Other (See Comments)    Insomnia   Duloxetine Hcl Other (See Comments)    Insomnia   Tizanidine Nausea And Vomiting   Venlafaxine Other (See Comments)    Hallucinations   Effersyllium [Psyllium] Other (See Comments)    Made the patient "feel badly"   Escitalopram Other (See Comments)    insomnia   Neurontin [Gabapentin] Other (See Comments)    Made the patient "feel badly"   Tramadol Other (See Comments)    Made the patient "feel badly"    Social History:   Social History   Socioeconomic History   Marital status: Single    Spouse name: Not on file   Number of children: Not on file   Years of education: Not on file   Highest education level: Not on file  Occupational History   Not on file  Tobacco Use   Smoking status: Every Day    Current packs/day: 0.50    Types: Cigarettes   Smokeless tobacco: Never  Vaping Use   Vaping status: Never Used  Substance and Sexual Activity   Alcohol use: No    Comment: used to drink wine on regular basis but not in the past 20 years-as per chart notes 06/2020   Drug use: Not Currently    Comment: remote hx IV drug use in her early 64s   Sexual activity: Not on file  Other Topics Concern   Not on file  Social History Narrative   Not on file   Social Determinants of Health   Financial Resource Strain: Not on file  Food  Insecurity: No Food Insecurity (02/14/2023)   Hunger Vital Sign    Worried About Running Out of Food in the Last Year: Never true    Ran Out of Food in the Last Year: Never true  Transportation Needs: No Transportation Needs (02/14/2023)   PRAPARE - Administrator, Civil Service (Medical): No    Lack of Transportation (Non-Medical): No  Physical Activity: Not on file  Stress: Not on file  Social Connections: Unknown (08/01/2021)   Received from [facility]   Social Network    Social Network: Not on file  Intimate Partner Violence: Not At Risk (02/14/2023)   Humiliation, Afraid, Rape, and Kick questionnaire    Fear of Current or Ex-Partner: No    Emotionally Abused: No    Physically Abused: No  Sexually Abused: No    Family History:    Family History  Problem Relation Age of Onset   Scoliosis Mother      ROS:  Constitutional: fatigue Eyes: Denied vision change or loss Ears/Nose/Mouth/Throat: Denied ear ache, sore throat, coughing, sinus pain Cardiovascular: see HPI Respiratory: see HPI Gastrointestinal: see HPI Genital/Urinary: Denied dysuria, hematuria, urinary frequency/urgency Musculoskeletal: Denied muscle ache, joint pain, weakness Skin: Denied rash, wound Neuro: Denied headache, dizziness, syncope Psych: Denied history of depression/anxiety  Endocrine: Denied history of diabetes    Physical Exam/Data:   Vitals:   03/03/23 0604 03/03/23 0642 03/03/23 1110 03/03/23 1334  BP:  115/74 (!) 98/59 122/70  Pulse: (!) 150 (!) 151 (!) 134 (!) 105  Resp:  16 20 (!) 26  Temp:  98.5 F (36.9 C)  97.7 F (36.5 C)  TempSrc:  Oral  Oral  SpO2:  100% 97% 100%  Weight:      Height:        Intake/Output Summary (Last 24 hours) at 03/03/2023 1439 Last data filed at 03/03/2023 0641 Gross per 24 hour  Intake 67.91 ml  Output --  Net 67.91 ml      03/03/2023    4:25 AM 03/02/2023    4:03 AM 03/01/2023    5:00 AM  Last 3 Weights  Weight (lbs) 99 lb 3.3 oz  98 lb 1.7 oz 103 lb 6.3 oz  Weight (kg) 45 kg 44.5 kg 46.9 kg     Body mass index is 16.51 kg/m.  Vitals:  Vitals:   03/03/23 1110 03/03/23 1334  BP: (!) 98/59 122/70  Pulse: (!) 134 (!) 105  Resp: 20 (!) 26  Temp:  97.7 F (36.5 C)  SpO2: 97% 100%   General Appearance: In no apparent distress, laying in bed, chronic ill appearing  HEENT: Normocephalic, atraumatic.  Neck: Supple, trachea midline, no JVDs Cardiovascular: Regular rate and rhythm, normal S1-S2,  no murmur Respiratory: Resting breathing unlabored, lungs sounds clear to auscultation bilaterally, no use of accessory muscles. On [place] oxygen  Gastrointestinal: Bowel sounds positive, abdomen soft  Extremities: Able to move all extremities in bed without difficulty, no edema of BLE Musculoskeletal: generalized muscle wasting  Skin: Intact, warm, dry.  Neurologic: Alert, oriented to person, place and time. no gross focal neuro deficit Psychiatric: Normal affect. Mood is appropriate.    EKG:  The EKG was personally reviewed and demonstrates:    EKG from 03/03/2023 at 4:56 appears to be consistent with atrial flutter with ventricular rate of 152 bpm.   Repeat EKG on 03/03/2023 11:14 appears to be consistent with A-fib RVR 138 bpm.    EKG on 03/03/2023 at 11:16 appears to be consistent with sinus tachycardia 116 bpm, PACs and PVC.   Telemetry:  Telemetry was personally reviewed and demonstrates:    Sinus tachycardia mid 110s, frequent PVCs and PACs; noted episodes of narrow complex tachycardia likely atrial flutter RVR 150bpm from 4:41 AM to 6:57 AM on 03/03/2023, recurrent episode of narrow complex tachycardia consistent with atrial flutter RVR 150 bpm from 10:37 AM to 11:36 AM and 12:02 PM to 12:17 PM on 03/03/23, spontaneous conversion to sinus tachycardia with each episode, there is some irregularity of the rhythm immediate before each conversion suggestive of atrial fibrillation  Relevant CV Studies:   Echo 02/14/23:    1. Left ventricular ejection fraction, by estimation, is 60 to 65%. The  left ventricle has normal function. The left ventricle has no regional  wall motion abnormalities.  Left ventricular diastolic parameters were  normal.   2. Right ventricular systolic function is normal. The right ventricular  size is normal.   3. The mitral valve is normal in structure. No evidence of mitral valve  regurgitation. No evidence of mitral stenosis.   4. The aortic valve is normal in structure. Aortic valve regurgitation is  not visualized. No aortic stenosis is present.   5. The inferior vena cava is normal in size with greater than 50%  respiratory variability, suggesting right atrial pressure of 3 mmHg     Laboratory Data:  High Sensitivity Troponin:  No results for input(s): "TROPONINIHS" in the last 720 hours.   Chemistry Recent Labs  Lab 02/25/23 0454 02/27/23 0531 03/02/23 1235  NA 130* 130* 129*  K 3.8 4.1 3.6  CL 102 102 101  CO2 23 23 21*  GLUCOSE 92 108* 94  BUN 11 13 12   CREATININE 0.43* 0.42* 0.43*  CALCIUM 7.4* 7.4* 7.5*  GFRNONAA >60 >60 >60  ANIONGAP 5 5 7     Recent Labs  Lab 02/25/23 0454 02/27/23 0531 03/02/23 1235  PROT 5.5* 5.6* 5.7*  ALBUMIN 1.8* 1.8* 1.8*  AST 45* 57* 50*  ALT 12 19 25   ALKPHOS 135* 141* 145*  BILITOT 0.6 0.6 0.8   Lipids No results for input(s): "CHOL", "TRIG", "HDL", "LABVLDL", "LDLCALC", "CHOLHDL" in the last 168 hours.  Hematology Recent Labs  Lab 02/25/23 0454 02/27/23 0531 03/02/23 1235  WBC 7.4 6.1 8.1  RBC 3.26* 3.29* 3.31*  HGB 10.1* 10.2* 10.2*  HCT 30.1* 31.3* 32.7*  MCV 92.3 95.1 98.8  MCH 31.0 31.0 30.8  MCHC 33.6 32.6 31.2  RDW 19.0* 20.2* 19.9*  PLT 145* 190 274   Thyroid No results for input(s): "TSH", "FREET4" in the last 168 hours.  BNPNo results for input(s): "BNP", "PROBNP" in the last 168 hours.  DDimer No results for input(s): "DDIMER" in the last 168 hours.   Radiology/Studies:  No results  found.   Assessment and Plan:   Paroxysmal atrial flutter/fibrillation with RVR - Reviewed patient's EKG and telemetry today, Anita Price has been having recurrent episodes of narrow complex tachycardia that appears to be consistent with atrial flutter/fibrillation with ventricular rate of 150s over the past 24 hours. See MD addendum for further review.  Anita Price has spontaneously converted to sinus rhythm/tachycardia with each episode so far.  Anita Price is not aware of these episodes.  Given significant medical illness Anita Price has been through over the past 18 days, suspect this is acute illness mediated. Echo from 02/14/23 reassuring. Will check TSH today. Please keep Mag >2 and K >4. OK to continue coreg 3.125mg  BID, if recurrent sustained episode, may switch to metoprolol XL 50mg  daily for rate control.  - CHA2DS2-VASc Score = 3 , CT showed splenic infarct and PE, Anita Price is already on Eliquis, would continue anticoagulation  - will refer to A fib clinic in 1 month, re-assess A fib/flutter once acute issues resolve   Streptococcus intermedius empyema  Pulmonary embolism  Cirrhosis  Chronic HCV  Hyponatremia  Chronic pain - per primary team    Risk Assessment/Risk Scores:   CHA2DS2-VASc Score = 3  This indicates a 3.2% annual risk of stroke. The patient's score is based upon: CHF History: 0 HTN History: 1 Diabetes History: 0 Stroke History: 0 Vascular Disease History: 0 Age Score: 1 Gender Score: 1     For questions or updates, please contact [facility] Please consult [email or web address] for contact info under  Signed, Cyndi Bender, NP  03/03/2023 2:39 PM  ADDENDUM:   Patient seen and examined with Cyndi Bender, NP .  I personally taken a history, examined the patient, reviewed relevant notes,  laboratory data / imaging studies.  I performed a substantive portion of this encounter and formulated the important aspects of the plan.  I agree with the APP's note, impression, and recommendations;  however, I have edited the note to reflect changes or salient points (which are noted in italics).   Patient is resting in bed comfortably. No family or friends at bedside.  Cardiology was consulted for evaluation of tachycardia.  No prior history of myocardial infarction/CAD/heart failure/TIA/CVA. Prolonged hospitalization -empyema status post thoracentesis cultures grew Streptococcus intermedius.  Underwent chest tube placement and drainage of empyema.  CT studies noted pulmonary embolism involving the right lower lobe segmental and subsegmental branches.  Underwent paracentesis on 02/22/2023.  CT imaging done earlier this admission is also noted several splenic infarcts.  PHYSICAL EXAM: Today's Vitals   03/03/23 0840 03/03/23 1110 03/03/23 1334 03/03/23 1709  BP:  (!) 98/59 122/70 126/73  Pulse:  (!) 134 (!) 105   Resp:  20 (!) 26 (!) 25  Temp:   97.7 F (36.5 C) 98.1 F (36.7 C)  TempSrc:   Oral Oral  SpO2:  97% 100% 96%  Weight:      Height:      PainSc: 7       Body mass index is 16.51 kg/m.   Net IO Since Admission: 3,729.3 mL [03/03/23 1731]  Filed Weights   03/01/23 0500 03/02/23 0403 03/03/23 0425  Weight: 46.9 kg 44.5 kg 45 kg    Physical Exam  Constitutional: Anita Price appears cachectic. No distress. Anita Price appears chronically ill.  hemodynamically stable  HENT:  Dry oral membranes, poor dentition  Neck: No JVD present.  Cardiovascular: S1 normal and S2 normal. An irregular rhythm present. Tachycardia present.  No murmurs rubs or gallops appreciated secondary to tachycardia  Pulmonary/Chest: No stridor. Anita Price has no wheezes. Anita Price has no rales. Anita Price exhibits no tenderness.  Decreased breath sounds bilaterally, poor inspiratory effort  Abdominal: Soft. Bowel sounds are normal. Anita Price exhibits no distension. There is no abdominal tenderness.  Musculoskeletal:        General: No edema.     Cervical back: Neck supple.  Neurological: Anita Price is alert and oriented to person, place,  and time. Anita Price has intact cranial nerves (2-12).  Skin: Skin is warm.    EKG: (personally reviewed by me) March 03, 2023:  4:56 AM atrial flutter with variable conduction, 152 bpm, nonspecific ST-T abnormality.  11:14 AM: Atrial fibrillation, 138 bpm, nonspecific ST-T changes  Telemetry: (personally reviewed by me) Sinus tachycardia with PACs. Atrial flutter with variable conduction Intermittent atrial fibrillation   Impression:  New onset of atrial flutter/fibrillation: Likely precipitated by her acute illness -infective empyema requiring thoracentesis and chest tube placement, paracentesis, underlying cirrhosis likely due to hepatitis C, newly discovered right-sided pulmonary embolism Rate control: Discontinue carvedilol, transition to propranolol 20 mg p.o. 3 times daily with holding parameters (given her history of cirrhosis and hep C).  Rhythm control: N/A. Thromboembolic prophylaxis: Already on Eliquis given her history of PE CHA2DS2-VASc score is 3 as noted above CT findings of splenic infarct as well. Patient spontaneously converts to sinus tachycardia. Very hopeful that Anita Price will be able to maintain sinus rhythm as the underlying acute illnesses start to resolve.  No prior history of intracranial or gastrointestinal bleeding despite her history  of cirrhosis. Risks, benefits, and alternatives to long-term oral anticoagulation discussed Agree with outpatient atrial fibrillation clinic follow-up.  Continue telemetry Will uptitrate AV nodal blocking agents based on her hemodynamics.  Acute pulmonary embolism: Diagnosed during this hospitalization. Currently on Eliquis dosing  Infected empyema status post thoracentesis and chest tube placement: Management per primary team  Hepatitis C with complications of cirrhosis: Management per primary team  I offered speaking to her next of kin, daughter, but patient would like to hold off for now..  Her questions and concerns were  addressed to her satisfaction.   This note was created using a voice recognition software as a result there may be grammatical errors inadvertently enclosed that do not reflect the nature of this encounter. Every attempt is made to correct such errors.   Tessa Lerner, DO, [facility]  [address] [place], [place] [address] Pager: [phone] Office: [phone] 03/03/2023 5:31 PM

## 2023-03-03 NOTE — Progress Notes (Signed)
PROGRESS NOTE Anita Price  UJW:[id number] DOB: 07-25-1955 DOA: 02/13/2023 PCP: Jackie Plum, MD  Brief Narrative/Hospital Course: 67 year old female with chronic medical history of Chronic hepatitis C/liver cirrhosis which has been untreated in the past--underlying cirrhosis, tobacco abuse,  Chronic pain syndrome due to chronic back pain--- underlying cervical radiculopathy status post C3-C6 ACDF Dr. Yevette Edwards--- follows with pain clinic in [place] is debating spinal cord stimulator trial, Degenerative joint disease, HTN,previous thyroid nodule, brought to the ED 11/14 for generalized weakness, anorexia nausea and vomiting multiple times x 3 to 4 days PTA. In the ED arousable but confused workup revealed large loculated left pleural effusion endometrial thickening splenic infarcts Patient was admitted on antibiotics underwent thoracentesis pulmonary IR consult with prolonged hospitalization and multiple procedure as below:   11/14: Admitted on antibiotics, thoracentesis with purulent material 11/15: Pulmonology consulted, chest tube inserted 11/16: Breathing worse; Up to 35L HFNC; Pleural Lytics given 11/18: Second chest tube placed by IR 11/19: Dobbhoff Cortrak placed--- palliative medicine consulted additionally for goals of care--remains full code. 11/22: Lower right-sided chest tube removed 11/23: all chest tubes removed 11/23: abd paracentesis done 450 cc  11/28: Doing well oriented NG tube feeding, FMS and Foley catheter discontinued  Significant studies: 11/14 CT head: NAD 11/14 CT chest: large left pleural effusion 11/15 Echo: Normal EF, normal valves 11/18 CT chest repeat moderate large volume left pleural effusion 20 cm greatest dimension, left base pigtail catheter drainage subscribe pragmatic right basilar nodular pulmonary opacities multifocal pneumonia 11/22 CT chest = substantial improvement left empyema lateral component completely evacuated residue 2 loculations 2.8  X3.9, 2.4 X3.1 11/22 CT abdomen pelvis cirrhosis liver portal hypertension gastrorenal shunt distal varices moderate ascites anasarca-probable splenic infarct?  Laceration seems stable from prior with no extravasation?  Subsegmental pulmonary embolus posterior basilar right lower lobe 11/20 repeat CT chest with contrast shows right lower lobe segmental and subsegmental emboli no heart strain loculated effusion left with pleural thickening left lower lobe atelectasis small right pleural effusion with patchy airspace disease and cirrhosis?  Splenic infarct again seen  Patient has been tolerating diet remains medically stable awaiting for skilled nursing facility at this time [facility] awaiting for bed offers  Subjective: Seen and examined, Overnight vitals/labs/events reviewed  Overnight HR in 150s and improved with lopressor and coreg HR improed in 90s Has no new complaints Agreeable for SNF now  Assessment and Plan: Principal Problem:   Sepsis due to Streptococcus intermedius empyema (HCC) Active Problems:   Empyema (HCC)   Acute metabolic encephalopathy   Essential hypertension   Tobacco use   Hyponatremia   Prolonged QT interval   Hepatic cirrhosis in setting of untreated hepatitis C    AKI (acute kidney injury) (HCC)   Abnormal ultrasound of uterus   Hypokalemia   Uncomplicated opioid dependence (HCC)   Thrombocytopenia (HCC)   Hypophosphatemia   Lactic acidosis   Malnutrition of moderate degree   Palliative care encounter   Goals of care, counseling/discussion   Counseling and coordination of care   Need for emotional support   Sepsis (HCC)   DNR (do not resuscitate) discussion   Poor appetite   Medication management   Streptococcus intermedius empyema on left Leaking chest tube site LLL- ostomy in place now Sepsis POA due to empyema: S/p multiple procedures including thoracentesis 11/14 with milky white output> left lower chest tube initially 11/15  s/p lytics, additional  chest tube placed 11/18 and left lower chest tube removed 11/22 and left anterior chest tube in  02/23/23.Small effusion present but will likely self resolve or a scar down- has ostomy bag placed at the site of previous LLchest tube  and is dry, stitch+ and ostomy bag has been removed 12/1 and no issues. Initially on cefazolin> changed to cefadroxil -dose adjusted 11/28> EOT 03/13/23. Encourage mobility as she remains very deconditioned and weak.  Tachycardia 12/2 am: HR improved after Lopressor and placed on coreg.  Again having recurrent issues this morning will get EKG continue as needed Lopressor and consult cardiology as HR in 150s in sleep  RLL pulmonary emboli: No acute issues, continue Eliquis   Acute metabolic encephalopathy History hepatoencephalopathy: She appears weak and frail, mental status improved at times mild confusion but stable  multifactorial due to sepsis, on chronic opiates.Unlikely hepatic encephalopathy Cont to mobilize with PT OT  Diarrhea: While on tube feeding. Off FMS. Resolved. Changed imodium to prn.. previously ID was consulted and discussed c diff testing defferred  Dysphagia: SLP following continue DOS 3 diet.  Tolerating. 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  Metabolic acidosis Chronic hyponatremia 2/2 cirrhosis Hypokalemia Hypoalbuminemia: Monitor electrolytes and replace as needed.  Augment diet.     Impaired glucose tolerance likely secondary to feeds-no workup unless above 180   Chronic pain syndrome from ACDF : Follows with pain clinic. Cont  MS IR 7.5 mg q 4 hr prn-Home dose 30 every 8, hold high doses for now Stopped--Lyrica , Flexeril 10 at bedtime    Degenerative joint disease NOS   Endometrial thickening Needs outpatient  characterization not emergently   Thyroid nodule Needs outpatient characterization not emergently  Failure to thrive/debility/deconditioning Palliative care already following.  Continue core track diet Overall prognosis does not appear bright.  Moderate malnutrition: Completing calorie count today. Nutrition Status: Nutrition Problem: Moderate Malnutrition Etiology: chronic illness Signs/Symptoms: moderate fat depletion, severe muscle depletion Interventions: Refer to RD note for recommendations, Boost Breeze, Tube feeding  DVT prophylaxis: SCDs Start: 02/13/23 1735 Eliquis Code Status:   Code Status: Limited: Do not attempt resuscitation (DNR) -DNR-LIMITED -Do Not Intubate/DNI  Family Communication: plan of care discussed with patient/NO FAMILY at bedside. Called to udpated her daughter but no answer 11/28. Was able to update daughter 11/29 and agrees for SNF [facility] AWARE Patient status is: Inpatient because of encephalopathy and malnutrition Level of care: Progressive   Dispo: The patient is from: HOME, with daughter            Anticipated disposition: Agreeable for SNF.  Awaiting for bed  Objective: Vitals last 24 hrs: Vitals:   03/03/23 0416 03/03/23 0425 03/03/23 0604 03/03/23 0642  BP: (!) 147/88   115/74  Pulse: (!) 104  (!) 150 (!) 151  Resp: 18   16  Temp: 98 F (36.7 C)   98.5 F (36.9 C)  TempSrc:    Oral  SpO2: 98%   100%  Weight:  45 kg    Height:       Weight change: 0.5 kg  Physical Examination: General exam: alert awake, ill-appearing, frail HEENT:Oral mucosa moist, Ear/Nose WNL grossly Respiratory system: Bilaterally diminished BS,no use of accessory muscle Cardiovascular system: S1 & S2 +, No JVD. Gastrointestinal system: Abdomen soft,NT,ND, BS+ Nervous System: Alert, awake, moving all extremities,and following commands. Extremities: LE edema neg,distal peripheral pulses palpable and warm.  Skin: No rashes,no icterus. MSK: Normal  muscle bulk,tone,  power   Medications reviewed:  Scheduled Meds:  (feeding supplement) PROSource Plus  30 mL Oral TID BM   apixaban  5 mg Oral BID   carvedilol  3.125 mg Oral BID WC   cefadroxil  1,000 mg Oral BID   dronabinol  2.5 mg Oral BID AC   feeding supplement  1 Container Oral TID BM   influenza vaccine adjuvanted  0.5 mL Intramuscular Tomorrow-1000   mouth rinse  15 mL Mouth Rinse 4 times per day   mouth rinse  15 mL Mouth Rinse 4 times per day   pantoprazole  40 mg Oral Daily   spironolactone  50 mg Oral Daily  Continuous Infusions:   Diet Order             DIET DYS 3 Room service appropriate? Yes; Fluid consistency: Thin; Fluid restriction: 1500 mL Fluid  Diet effective now                   Intake/Output Summary (Last 24 hours) at 03/03/2023 0845 Last data filed at 03/03/2023 0641 Gross per 24 hour  Intake 547.91 ml  Output --  Net 547.91 ml   Net IO Since Admission: 3,009.3 mL [03/03/23 0845]  Wt Readings from Last 3 Encounters:  03/03/23 45 kg  06/30/21 49.9 kg  07/10/20 54.4 kg     Unresulted Labs (From admission, onward)     Start     Ordered   02/22/23 1328  Acid Fast Culture with reflexed sensitivities  RELEASE UPON ORDERING,   TIMED        02/22/23 1328   02/14/23 0500  CBC with Differential  Daily,   R,   Status:  Canceled      02/13/23 1733          Data Reviewed: I have personally reviewed following labs and imaging studies CBC: Recent Labs  Lab 02/25/23 0454 02/27/23 0531 03/02/23 1235  WBC 7.4 6.1 8.1  HGB 10.1* 10.2* 10.2*  HCT 30.1* 31.3* 32.7*  MCV 92.3 95.1 98.8  PLT 145* 190 274   Basic Metabolic Panel:  Recent Labs  Lab 02/25/23 0454 02/27/23 0531 03/02/23 1235  NA 130* 130* 129*  K 3.8 4.1 3.6  CL 102 102 101  CO2 23 23 21*  GLUCOSE 92 108* 94  BUN 11 13 12   CREATININE 0.43* 0.42* 0.43*  CALCIUM 7.4* 7.4* 7.5*   GFR: Estimated Creatinine Clearance: 49.1 mL/min (A) (by C-G formula based on SCr of 0.43 mg/dL (L)). Liver  Function Tests:  Recent Labs  Lab 02/25/23 0454 02/27/23 0531 03/02/23 1235  AST 45* 57* 50*  ALT 12 19 25   ALKPHOS 135* 141* 145*  BILITOT 0.6 0.6 0.8  PROT 5.5* 5.6* 5.7*  ALBUMIN 1.8* 1.8* 1.8*   No results for input(s): "LIPASE", "AMYLASE" in the last 168 hours. No results for input(s): "AMMONIA" in the last 168 hours. Coagulation Profile:  No results for input(s): "INR", "PROTIME" in the last 168 hours.  Recent Labs  Lab 03/02/23 1638 03/02/23 2036 03/03/23 0029 03/03/23 0417 03/03/23 0746  GLUCAP 112* 76 97 94 215*   Recent Results (from the past 240 hour(s))  Aerobic/Anaerobic Culture w Gram Stain (surgical/deep wound)     Status: None   Collection Time: 02/22/23  1:26 PM   Specimen: PATH Cytology Peritoneal fluid  Result Value Ref Range Status   Specimen Description   Final    PERITONEAL Performed at [facility], [address] [place], [place] [address]    Special Requests   Final    NONE Performed at [facility], [address] [place], [place] [address]    Gram Stain   Final    CYTOSPIN SMEAR WBC PRESENT,BOTH PMN AND MONONUCLEAR NO ORGANISMS SEEN    Culture   Final    No growth aerobically or anaerobically. Performed at [facility], [address], [place], [place] [address]    Report Status 02/27/2023 FINAL  Final  Acid Fast Smear (AFB)     Status: None   Collection Time: 02/22/23  1:26 PM   Specimen: PATH Cytology Peritoneal fluid  Result Value Ref Range Status   AFB Specimen Processing Concentration  Final   Acid Fast Smear Negative  Final    Comment: (NOTE) Performed At: [facility] [address] [place], [place] [id number] Jolene Schimke MD OZ:[id number]    Source (AFB) PERITONEAL  Final    Comment: Performed at [facility], [address] [place], [place] [address]  Culture, body fluid w Gram Stain-bottle     Status: None   Collection Time: 02/22/23  1:26 PM    Specimen: Peritoneal Washings  Result Value Ref Range Status   Specimen Description PERITONEAL  Final   Special Requests NONE  Final   Gram Stain   Final    CYTOSPIN SMEAR WBC PRESENT,BOTH PMN AND MONONUCLEAR NO ORGANISMS SEEN    Culture   Final    NO GROWTH 5 DAYS Performed at [facility], [address] [place], [place] [address]    Report Status 02/27/2023 FINAL  Final  Gram stain     Status: None   Collection Time: 02/22/23  1:26 PM   Specimen: Peritoneal Washings  Result Value Ref Range Status   Specimen Description PERITONEAL  Final   Special Requests NONE  Final   Gram Stain   Final    NO ORGANISMS SEEN WBC PRESENT, PREDOMINANTLY PMN CYTOSPIN SMEAR Performed at [facility], [address], [place], [place] [address]    Report Status 02/23/2023 FINAL  Final    Antimicrobials: Anti-infectives (From admission, onward)    Start     Dose/Rate Route Frequency Ordered Stop   02/26/23 2200  cefadroxil (DURICEF) capsule 1,000 mg        1,000 mg Oral 2 times daily 02/26/23 1444     02/24/23 2200  cefadroxil (DURICEF) capsule 500 mg  Status:  Discontinued        500 mg Oral 2 times daily 02/24/23 1730 02/26/23 1444   02/16/23 0930  ceFAZolin (ANCEF) IVPB 2g/100 mL premix  Status:  Discontinued        2 g 200 mL/hr over 30 Minutes Intravenous Every 8 hours 02/16/23 0843 02/24/23 1730   02/15/23 1800  vancomycin (VANCOCIN) IVPB 1000 mg/200 mL premix  Status:  Discontinued        1,000 mg 200 mL/hr over 60 Minutes Intravenous Every 48 hours 02/13/23 1754 02/14/23 1437   02/15/23 1000  vancomycin (VANCOREADY) IVPB 750 mg/150 mL  Status:  Discontinued        750 mg 150 mL/hr over 60 Minutes Intravenous Daily 02/14/23 1437 02/16/23 0843   02/14/23 1400  vancomycin (VANCOREADY) IVPB 750 mg/150 mL        750 mg 150 mL/hr over 60 Minutes Intravenous  Once 02/14/23 1122 02/14/23 1501   02/14/23 1000  rifaximin (XIFAXAN) tablet 550 mg  Status:  Discontinued         550 mg Oral 3 times daily 02/14/23 0852 02/14/23 0855   02/14/23 1000  rifaximin (XIFAXAN) tablet 550 mg  Status:  Discontinued        550 mg Oral 2 times daily 02/14/23 0855 02/18/23 1438   02/13/23 2300  piperacillin-tazobactam (ZOSYN) IVPB 3.375 g  Status:  Discontinued        3.375 g 12.5 mL/hr over 240 Minutes Intravenous Every 8 hours 02/13/23 1803 02/16/23 0843   02/13/23 2200  metroNIDAZOLE (FLAGYL) IVPB 500 mg  Status:  Discontinued        500 mg 100 mL/hr over 60 Minutes Intravenous Every 12 hours 02/13/23 1733 02/13/23 1802   02/13/23 2000  ceFEPIme (MAXIPIME) 2 g in sodium chloride 0.9 % 100 mL IVPB  Status:  Discontinued        2 g 200 mL/hr over 30 Minutes Intravenous Every 8 hours 02/13/23 1733 02/13/23 1749   02/13/23 1800  vancomycin (VANCOCIN) IVPB 1000 mg/200 mL premix  Status:  Discontinued        1,000 mg 200 mL/hr over 60 Minutes Intravenous  Once 02/13/23 1745 02/13/23 1802   02/13/23 1100  ceFEPIme (MAXIPIME) 2 g in sodium chloride 0.9 % 100 mL IVPB        2 g 200 mL/hr over 30 Minutes Intravenous  Once 02/13/23 1052 02/13/23 1235   02/13/23 1100  metroNIDAZOLE (FLAGYL) IVPB 500 mg        500 mg 100 mL/hr over 60 Minutes Intravenous  Once 02/13/23 1052 02/13/23 1235   02/13/23 1100  vancomycin (VANCOCIN) IVPB 1000 mg/200 mL premix        1,000 mg 200 mL/hr over 60 Minutes Intravenous  Once 02/13/23 1052 02/13/23 1355      Culture/Microbiology    Component Value Date/Time   SDES  02/22/2023 1326    PERITONEAL Performed at [facility], [address] [place], [place] [address]    SDES PERITONEAL 02/22/2023 1326   SDES PERITONEAL 02/22/2023 1326   SPECREQUEST  02/22/2023 1326    NONE Performed at [facility], [address] [place], [place] [address]    SPECREQUEST NONE 02/22/2023 1326   SPECREQUEST NONE 02/22/2023 1326   CULT  02/22/2023 1326    No growth aerobically or anaerobically. Performed at [facility], [address] [place], [place] [address]    CULT  02/22/2023 1326    NO GROWTH 5 DAYS Performed at [facility], [address] [place], [place] [address]    REPTSTATUS 02/27/2023 FINAL 02/22/2023 1326   REPTSTATUS 02/27/2023 FINAL 02/22/2023 1326   REPTSTATUS 02/23/2023 FINAL 02/22/2023 1326     Radiology Studies: No results found.   LOS: 18 days   Total time spent in review of labs and imaging, patient evaluation, formulation of plan, documentation and communication with family: 35 minutes  Lanae Boast, MD [facility]  03/03/2023, 8:45 AM

## 2023-03-04 DIAGNOSIS — I480 Paroxysmal atrial fibrillation: Secondary | ICD-10-CM

## 2023-03-04 DIAGNOSIS — I2609 Other pulmonary embolism with acute cor pulmonale: Secondary | ICD-10-CM

## 2023-03-04 DIAGNOSIS — I4892 Unspecified atrial flutter: Secondary | ICD-10-CM

## 2023-03-04 DIAGNOSIS — R9431 Abnormal electrocardiogram [ECG] [EKG]: Secondary | ICD-10-CM

## 2023-03-04 LAB — BASIC METABOLIC PANEL
Anion gap: 9 (ref 5–15)
Anion gap: 7 (ref 5–15)
BUN: 11 mg/dL (ref 8–23)
BUN: 9 mg/dL (ref 8–23)
CO2: 16 mmol/L — ABNORMAL LOW (ref 22–32)
CO2: 20 mmol/L — ABNORMAL LOW (ref 22–32)
Calcium: 7.5 mg/dL — ABNORMAL LOW (ref 8.9–10.3)
Calcium: 7.8 mg/dL — ABNORMAL LOW (ref 8.9–10.3)
Chloride: 110 mmol/L (ref 98–111)
Chloride: 111 mmol/L (ref 98–111)
Creatinine, Ser: <0.3 mg/dL — ABNORMAL LOW (ref 0.44–1.00)
Creatinine, Ser: <0.3 mg/dL — ABNORMAL LOW (ref 0.44–1.00)
Glucose, Bld: 221 mg/dL — ABNORMAL HIGH (ref 70–99)
Glucose, Bld: 97 mg/dL (ref 70–99)
Potassium: 2.8 mmol/L — ABNORMAL LOW (ref 3.5–5.1)
Potassium: 2.9 mmol/L — ABNORMAL LOW (ref 3.5–5.1)
Sodium: 135 mmol/L (ref 135–145)
Sodium: 138 mmol/L (ref 135–145)

## 2023-03-04 LAB — GLUCOSE, CAPILLARY
Glucose-Capillary: 160 mg/dL — ABNORMAL HIGH (ref 70–99)
Glucose-Capillary: 79 mg/dL (ref 70–99)
Glucose-Capillary: 91 mg/dL (ref 70–99)
Glucose-Capillary: 155 mg/dL — ABNORMAL HIGH (ref 70–99)
Glucose-Capillary: 81 mg/dL (ref 70–99)

## 2023-03-04 LAB — MAGNESIUM: Magnesium: 1.7 mg/dL (ref 1.7–2.4)

## 2023-03-04 LAB — AMMONIA: Ammonia: 36 umol/L — ABNORMAL HIGH (ref 9–35)

## 2023-03-04 MED ORDER — MAGNESIUM SULFATE IN D5W 1-5 GM/100ML-% IV SOLN
1.0000 g | Freq: Once | INTRAVENOUS | Status: AC
  Administered 2023-03-04: 1 g via INTRAVENOUS
  Filled 2023-03-04: qty 100

## 2023-03-04 MED ORDER — POTASSIUM CHLORIDE 10 MEQ/100ML IV SOLN
10.0000 meq | INTRAVENOUS | Status: AC
  Administered 2023-03-04 (×3): 10 meq via INTRAVENOUS
  Filled 2023-03-04 (×3): qty 100

## 2023-03-04 MED ORDER — POTASSIUM CHLORIDE CRYS ER 20 MEQ PO TBCR
40.0000 meq | EXTENDED_RELEASE_TABLET | ORAL | Status: AC
  Administered 2023-03-04 (×2): 40 meq via ORAL
  Filled 2023-03-04 (×2): qty 2

## 2023-03-04 MED ORDER — PROPRANOLOL HCL 20 MG PO TABS
40.0000 mg | ORAL_TABLET | Freq: Three times a day (TID) | ORAL | Status: DC
  Administered 2023-03-05 – 2023-03-06 (×4): 40 mg via ORAL
  Filled 2023-03-04 (×4): qty 2

## 2023-03-04 MED ORDER — POTASSIUM CHLORIDE 10 MEQ/100ML IV SOLN
INTRAVENOUS | Status: AC
  Administered 2023-03-04: 10 meq via INTRAVENOUS
  Filled 2023-03-04: qty 100

## 2023-03-04 NOTE — TOC Progression Note (Signed)
Transition of Care [facility]) - Progression Note    Patient Details  Name: Anita Price MRN: [id number] Date of Birth: 03-09-1956  Transition of Care [facility]) CM/SW Contact  Raijon Lindfors, Olegario Messier, RN Phone Number: 03/04/2023, 12:46 PM  Clinical Narrative: Noted cardio following, palliative care.[facility] bed available. MD updated.     Expected Discharge Plan: Skilled Nursing Facility Barriers to Discharge: No Barriers Identified  Expected Discharge Plan and Services In-house Referral: Clinical Social Work   Post Acute Care Choice: Home Health Living arrangements for the past 2 months: Apartment                                       Social Determinants of Health (SDOH) Interventions SDOH Screenings   Food Insecurity: No Food Insecurity (02/14/2023)  Housing: Low Risk  (02/14/2023)  Transportation Needs: No Transportation Needs (02/14/2023)  Utilities: Not At Risk (02/14/2023)  Social Connections: Unknown (08/01/2021)   Received from [facility]  Tobacco Use: High Risk (02/17/2023)    Readmission Risk Interventions    02/26/2023   10:09 AM 02/16/2023    3:49 PM 07/13/2020   11:39 AM  Readmission Risk Prevention Plan  Post Dischage Appt   Not Complete  Appt Comments   attempted to call PCP to schedule f/u- however office informed us that patient would need to call for appointment  Medication Screening   Complete  Transportation Screening Complete Not Complete Complete  Transportation Screening Comment  Patient only alert and orieted x1, unable to complete transportation screening.   PCP or Specialist Appt within 5-7 Days  Not Complete   Not Complete comments  Patient only alert and oriented x1 can't schedule an appt.   PCP or Specialist Appt within 3-5 Days Complete    Home Care Screening  Not Complete   Home Care Screening Not Completed Comments  Alert and oriented x1 and not medically ready for HH.   Medication Review (RN CM)  Referral to  Pharmacy   HRI or Home Care Consult Complete    Social Work Consult for Recovery Care Planning/Counseling Complete    Palliative Care Screening Complete    Medication Review Oceanographer) Complete

## 2023-03-04 NOTE — Progress Notes (Addendum)
Patient Name: Anita Price Date of Encounter: 03/04/2023 [facility] Cardiologist: None   Interval Summary  .    Very somnolent today, tachypneic, confused and does not answer questions appropriately.  Did not know who the president was or month despite telling her 3 seconds before.  Vital Signs .    Vitals:   03/03/23 2054 03/03/23 2302 03/04/23 0500 03/04/23 0506  BP: 108/78 113/76  123/70  Pulse: (!) 151 84  86  Resp: 20 20  (!) 21  Temp: 98.2 F (36.8 C) 97.7 F (36.5 C)  98 F (36.7 C)  TempSrc: Oral Oral  Oral  SpO2: 96% 96%  94%  Weight:   43.4 kg   Height:        Intake/Output Summary (Last 24 hours) at 03/04/2023 0934 Last data filed at 03/03/2023 1508 Gross per 24 hour  Intake 480 ml  Output --  Net 480 ml      03/04/2023    5:00 AM 03/03/2023    4:25 AM 03/02/2023    4:03 AM  Last 3 Weights  Weight (lbs) 95 lb 10.9 oz 99 lb 3.3 oz 98 lb 1.7 oz  Weight (kg) 43.4 kg 45 kg 44.5 kg      Telemetry/ECG    Suspect she is in atrial flutter with fixed conduction, heart rates in the 80s.  Obtain EKG now.- Personally Reviewed  CV Studies    Echocardiogram 02/14/2023 1. Left ventricular ejection fraction, by estimation, is 60 to 65%. The  left ventricle has normal function. The left ventricle has no regional  wall motion abnormalities. Left ventricular diastolic parameters were  normal.   2. Right ventricular systolic function is normal. The right ventricular  size is normal.   3. The mitral valve is normal in structure. No evidence of mitral valve  regurgitation. No evidence of mitral stenosis.   4. The aortic valve is normal in structure. Aortic valve regurgitation is  not visualized. No aortic stenosis is present.   5. The inferior vena cava is normal in size with greater than 50%  respiratory variability, suggesting right atrial pressure of 3 mmHg.   Physical Exam .   GEN: Tachypneic, confused, somnolent, cachectic Neck: No JVD Cardiac:  RRR, no murmurs, rubs, or gallops.  Respiratory: Crackles in left lower lobe GI: Soft, nontender, non-distended  MS: No edema  Patient Profile    Anita Price is a 67 y.o. female has hx of chronic hepatitis C, liver cirrhosis, tobacco abuse, chronic pain syndrome, hypertension, thyroid nodule.  She has had a prolonged admission since 02/13/2023 for significantly present today with generalized weakness with recurrent nausea and vomiting.  She is currently being evaluated for left-sided empyema status post chest tube insertion, right lower lobe pulmonary embolus, status postthoracentesis for large loculated pleural effusion, status post paracentesis,  Assessment & Plan .     New onset atrial flutter/fibrillation with RVR Was having recurrent episodes of narrow complex tachycardia consistent with flutter/fibrillation with heart rates in the 150s.  Reported to have spontaneously converted back to sinus.  Currently, I suspect she is in atrial flutter with heart rates in the 80s.  Will review telemetry with MD.  Either way given asymptomatic with controlled rates, would be more conservative with her treatment. Continue Eliquis 5 mg twice daily.  Currently she is on diltiazem 30 mg every 8, propranolol 20 mg 3 times daily.  Unclear why she is on both, will verify plan with MD.  TSH  normal. Maintain potassium greater than 4, magnesium greater than 2. Echo showed preserved EF 60-65% with normal RV function, euvolemic.  Addendum: spoke with MD will DC dilt.   Hypokalemia Potassium today was 2.8, started on oral and IV potassium and mag (1.7).  Be mindful of aggressive repletion given she is also on spiro 50 mg.  Will check a potassium level at 1200 today, check this level before giving another dose.  Acute RLL PE with splenic infarcts Continue Eliquis  Metabolic encephalopathy Cirrhosis, hepatitis C Somnolent, tachypneic and confused today.  Nurse will notify MD.  Consider checking ammonia and  CO2 levels.  On spironolactone 50 mg.  Cachectic/failure to thrive Palliative care following  Streptococcus intermedius empyema status post thoracentesis/chest tube Per primary team.  For questions or updates, please contact [facility] Please consult [email or web address] for contact info under     Signed, Abagail Kitchens, PA-C    ADDENDUM:   Patient seen and examined with Abagail Kitchens, PA-C  .  I personally taken a history, examined the patient, reviewed relevant notes,  laboratory data / imaging studies.  I performed a substantive portion of this encounter and formulated the important aspects of the plan.  I agree with the APP's note, impression, and recommendations; however, I have edited the note to reflect changes or salient points (which are noted in italics).   Patient seen and examined at bedside. Resting in bed comfortably. Denies anginal chest pain or heart failure symptoms. No family at bedside  PHYSICAL EXAM: Today's Vitals   03/04/23 1254 03/04/23 1530 03/04/23 1615 03/04/23 2113  BP: 119/71   135/71  Pulse: 83   70  Resp: 18   20  Temp: (!) 97.4 F (36.3 C)   (!) 97.4 F (36.3 C)  TempSrc: Oral   Oral  SpO2: 98%   95%  Weight:      Height:      PainSc:  8  Asleep    Body mass index is 15.92 kg/m.   Net IO Since Admission: 6,702.69 mL [03/04/23 2125]  Filed Weights   03/02/23 0403 03/03/23 0425 03/04/23 0500  Weight: 44.5 kg 45 kg 43.4 kg    Physical Exam  Constitutional: She appears cachectic. No distress. She appears chronically ill.  hemodynamically stable  HENT:  Dry oral membranes, poor dentition  Neck: No JVD present.  Cardiovascular: Normal rate, regular rhythm, S1 normal and S2 normal. Exam reveals no gallop, no S3 and no S4.  No murmur heard. Pulmonary/Chest: Effort normal and breath sounds normal. No stridor. She has no wheezes. She has no rales.  Abdominal: Soft. Bowel sounds are normal. She exhibits no distension. There is no  abdominal tenderness.  Musculoskeletal:        General: No edema.     Cervical back: Neck supple.  Neurological: She is alert and oriented to person, place, and time. She has intact cranial nerves (2-12).  Skin: Skin is warm.    EKG: (personally reviewed by me) 03/04/2023: Sinus rhythm, prolonged QT  Telemetry: (personally reviewed by me) Sinus rhythm   Impression:  Paroxysmal atrial flutter/fibrillation Prolonged QT, an EKG Acute pulmonary embolism. Infected empyema s/p thoracentesis and chest tube placement Hepatitis C with complications of cirrhosis   Recommendations:  Paroxysmal atrial flutter/fibrillation Newly discovered during his hospitalization on telemetry. Likely precipitated by acute illness which includes but not limited to empyema requiring thoracentesis and chest tube placement, paracentesis, underlying cirrhosis secondary to hepatitis C, and newly discovered right sided  pulmonary embolism. Patient has spontaneously converted to normal sinus rhythm with rate control strategy. She is already on oral anticoagulation given her diagnosis of pulmonary embolism. CT findings also illustrated splenic infarct CHA2DS2-VASc or 3 Continue rate control strategy for now. She has been getting carvedilol 3.125 mg p.o. twice daily, Cardizem 30 mg p.o. 3 times daily, and propranolol.  To simplify her medical regimen we will transition her to only propranolol 40 mg p.o. 3 times daily starting 03/05/2023 with holding parameters.  Propranolol chosen given her underlying cirrhosis. She will need outpatient follow-up to discuss long-term oral anticoagulation once she completes treatment for PE. EKG for the morning  Prolonged QT Noted on today's EKG. Recommend attending physician to consult pharmacy to reduce medications that may be contributory. Keep potassium at 4 and magnesium at 2 Continue telemetry  Further recommendations to follow as the case evolves.   This note was created  using a voice recognition software as a result there may be grammatical errors inadvertently enclosed that do not reflect the nature of this encounter. Every attempt is made to correct such errors.   Tessa Lerner, DO, [facility]  [address] [place], [place] [address] Pager: [phone] Office: [phone] 03/04/2023 9:25 PM

## 2023-03-04 NOTE — Progress Notes (Signed)
Occupational Therapy Treatment Patient Details Name: Anita Price MRN: [id number] DOB: 08-20-1955 Today's Date: 03/04/2023   History of present illness Patient is a 67 year old female who presented on 11/14 with confusion. Patient was found to have strep intermedius empyema, acute encephalopathy, severe protein calorie malnutrition, and RLL pulmonary emboli. Patient had multiple chest tubes from 11/5 to 11/24.   PMH: Hep C, cirrhosis, chronic pain, anorexia, tobacco use, chronic pain syndrome, ACDF c3-c6   OT comments  Patient was noted to have continued pain all over with movement. Patient was soiled and had removed gown prior to entrance to room. Patient was TD for hygiene tasks and +2 mod A to transition into recliner in room to get cleaned up. Patient anxious during session and needing increased encouragement to engage in session. Patients rehab potential is guarded at this time.Patient will benefit from continued inpatient follow up therapy, <3 hours/day.       If plan is discharge home, recommend the following:  Two people to help with walking and/or transfers;A lot of help with bathing/dressing/bathroom;Assistance with cooking/housework;Direct supervision/assist for medications management;Assist for transportation;Help with stairs or ramp for entrance;Direct supervision/assist for financial management   Equipment Recommendations  None recommended by OT       Precautions / Restrictions Precautions Precautions: Fall Precaution Comments: cervical precautions(H/O ACDF), Restrictions Weight Bearing Restrictions: No       Mobility Bed Mobility Overal bed mobility: Needs Assistance Bed Mobility: Supine to Sit     Supine to sit: +2 for physical assistance, +2 for safety/equipment, Mod assist     General bed mobility comments: patient yelling with movement         Balance   Sitting-balance support: Single extremity supported, Feet supported Sitting balance-Leahy Scale:  Fair     Standing balance support: Bilateral upper extremity supported Standing balance-Leahy Scale: Zero       ADL either performed or assessed with clinical judgement   ADL Overall ADL's : Needs assistance/impaired                         Toilet Transfer: +2 for physical assistance;+2 for safety/equipment;Moderate assistance;Stand-pivot Toilet Transfer Details (indicate cue type and reason): to recliner in room Toileting- Clothing Manipulation and Hygiene: Sit to/from stand;Total assistance Toileting - Clothing Manipulation Details (indicate cue type and reason): cues for posture              Cognition Arousal: Alert Behavior During Therapy: Flat affect Overall Cognitive Status: No family/caregiver present to determine baseline cognitive functioning     General Comments: patient needed encouragement to engage in time out of bed and to get clean after BM                   Pertinent Vitals/ Pain       Pain Assessment Pain Assessment: Faces Faces Pain Scale: Hurts whole lot Pain Location: all over with movements Pain Descriptors / Indicators: Discomfort, Grimacing, Guarding, Sharp, Shooting Pain Intervention(s): Limited activity within patient's tolerance, Monitored during session, Repositioned         Frequency  Min 1X/week        Progress Toward Goals  OT Goals(current goals can now be found in the care plan section)  Progress towards OT goals: OT to reassess next treatment     Plan         AM-PAC OT "6 Clicks" Daily Activity     Outcome Measure   Help from another  person eating meals?: A Little Help from another person taking care of personal grooming?: A Lot Help from another person toileting, which includes using toliet, bedpan, or urinal?: Total Help from another person bathing (including washing, rinsing, drying)?: Total Help from another person to put on and taking off regular upper body clothing?: Total Help from another  person to put on and taking off regular lower body clothing?: Total 6 Click Score: 9    End of Session    OT Visit Diagnosis: Muscle weakness (generalized) (M62.81);Pain;Other abnormalities of gait and mobility (R26.89)   Activity Tolerance Patient limited by pain   Patient Left in chair;with call bell/phone within reach;with chair alarm set   Nurse Communication Mobility status;Other (comment) (ok to work with therapy)        Time: 1610-9604 OT Time Calculation (min): 13 min  Charges: OT General Charges $OT Visit: 1 Visit OT Treatments $Self Care/Home Management : 8-22 mins  Rosalio Loud, MS Acute Rehabilitation Department Office# [phone]   Selinda Flavin 03/04/2023, 12:55 PM

## 2023-03-04 NOTE — Progress Notes (Signed)
PROGRESS NOTE Anita Price  OZH:[id number] DOB: [birth year] DOA: 02/13/2023 PCP: Jackie Plum, MD  Brief Narrative/Hospital Course: 67 year old female with chronic medical history of Chronic hepatitis C/liver cirrhosis which has been untreated in the past--underlying cirrhosis, tobacco abuse,  Chronic pain syndrome due to chronic back pain--- underlying cervical radiculopathy status post C3-C6 ACDF Dr. Yevette Edwards--- follows with pain clinic in [place] is debating spinal cord stimulator trial, Degenerative joint disease, HTN,previous thyroid nodule, brought to the ED 11/14 for generalized weakness, anorexia nausea and vomiting multiple times x 3 to 4 days PTA. In the ED arousable but confused workup revealed large loculated left pleural effusion endometrial thickening splenic infarcts Patient was admitted on antibiotics underwent thoracentesis pulmonary IR consult with prolonged hospitalization and multiple procedure as below:   11/14: Admitted on antibiotics, thoracentesis with purulent material 11/15: Pulmonology consulted, chest tube inserted 11/16: Breathing worse; Up to 35L HFNC; Pleural Lytics given 11/18: Second chest tube placed by IR 11/19: Dobbhoff Cortrak placed--- palliative medicine consulted additionally for goals of care--remains full code. 11/22: Lower right-sided chest tube removed 11/23: all chest tubes removed 11/23: abd paracentesis done 450 cc 11/28: Doing well oriented NG tube feeding, FMS and Foley catheter discontinued 12/2: Tachycardia A-fib with RVR cardiology consulted admitted Cystaid with propranolol, cardizem.  Significant studies: 11/14 CT head: NAD 11/14 CT chest: large left pleural effusion 11/15 Echo: Normal EF, normal valves 11/18 CT chest repeat moderate large volume left pleural effusion 20 cm greatest dimension, left base pigtail catheter drainage subscribe pragmatic right basilar nodular pulmonary opacities multifocal pneumonia 11/22 CT chest =  substantial improvement left empyema lateral component completely evacuated residue 2 loculations 2.8 X3.9, 2.4 X3.1 11/22 CT abdomen pelvis cirrhosis liver portal hypertension gastrorenal shunt distal varices moderate ascites anasarca-probable splenic infarct?  Laceration seems stable from prior with no extravasation?  Subsegmental pulmonary embolus posterior basilar right lower lobe 11/20 repeat CT chest with contrast shows right lower lobe segmental and subsegmental emboli no heart strain loculated effusion left with pleural thickening left lower lobe atelectasis small right pleural effusion with patchy airspace disease and cirrhosis?  Splenic infarct again seen  Patient has been tolerating diet remains medically stable awaiting for skilled nursing facility at this time [facility] awaiting for bed offers and placement.  Subjective: Seen and examined this morning Earlier today-was more confused but when I saw her much more alert awake answers questions No asterixis no focal weakness -Overnight afebrile -last night again tachycardic in 150s  Labs shows severely low potassium stable renal function TSH normal  Assessment and Plan: Principal Problem:   Sepsis due to Streptococcus intermedius empyema (HCC) Active Problems:   Empyema (HCC)   Acute metabolic encephalopathy   Essential hypertension   Tobacco use   Hyponatremia   Prolonged QT interval   Hepatic cirrhosis in setting of untreated hepatitis C    AKI (acute kidney injury) (HCC)   Abnormal ultrasound of uterus   Hypokalemia   Uncomplicated opioid dependence (HCC)   Thrombocytopenia (HCC)   Hypophosphatemia   Lactic acidosis   Malnutrition of moderate degree   Palliative care encounter   Goals of care, counseling/discussion   Counseling and coordination of care   Need for emotional support   Sepsis (HCC)   DNR (do not resuscitate) discussion   Poor appetite   Medication management   Atrial fibrillation and flutter (HCC)   Acute  pulmonary embolism without acute cor pulmonale (HCC)   Streptococcus intermedius empyema on left Leaking chest tube site LLL- ostomy  in place now Sepsis POA due to empyema: S/p multiple procedures including thoracentesis 11/14 with milky white output> left lower chest tube initially 11/15  s/p lytics, additional chest tube placed 11/18 and left lower chest tube removed 11/22 and left anterior chest tube in 02/23/23.Small effusion present but will likely self resolve or a scar down- has ostomy bag placed at the site of previous LLchest tube  and is dry, stitch+ and ostomy bag has been removed 12/1 and no issues. Initially on cefazolin> changed to cefadroxil -dose adjusted 11/28> EOT 03/13/23. Continue to mobilize, continue PT OT  Tachycardia 12/2 am PAF w/ RVR: Appreciate cardiology input this is in the setting of patient's multiple issues and distress .  Continue propranolol.discontinued Cardizem today.  Continue her Eliquis. monitor in telemetry .  Ensure heart rate stable for discharge.    RLL pulmonary emboli: No acute issues, continue Eliquis   Acute metabolic encephalopathy History hepatoencephalopathy: She appears weak and frail, mental status improved at times mild confusion but stable  multifactorial due to sepsis, on chronic opiates.Unlikely hepatic encephalopathy Cont to mobilize with PT OT  Diarrhea: While on tube feeding. Off FMS. Resolved. Changed imodium to prn.. previously ID was consulted and discussed c diff testing defferred  Dysphagia: SLP following continue DOS 3 diet.  Tolerating. 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  Metabolic acidosis Chronic hyponatremia 2/2 cirrhosis Hypokalemia Hypoalbuminemia: Monitor electrolytes and replace k and mag aggressively  this morning  Recent Labs  Lab 02/27/23 0531 03/02/23 1235 03/04/23 0645  K 4.1 3.6 2.8*  CALCIUM 7.4* 7.5* 7.5*  MG  --   --  1.7     Impaired glucose tolerance likely secondary to feeds-no workup unless above 180   Chronic pain syndrome from ACDF : Follows with pain clinic. Cont  MS IR 7.5 mg q 4 hr prn-Home dose 30 every 8, hold high doses for now Stopped--Lyrica , Flexeril 10 at bedtime    Degenerative joint disease NOS   Endometrial thickening Needs outpatient characterization not emergently   Thyroid nodule Needs outpatient characterization not emergently  Failure to thrive/debility/deconditioning Palliative care already following.  Continue core track diet Overall prognosis does not appear bright.  Moderate malnutrition: Completing calorie count today. Nutrition Status: Nutrition Problem: Moderate Malnutrition Etiology: chronic illness Signs/Symptoms: moderate fat depletion, severe muscle depletion Interventions: Refer to RD note for recommendations, Boost Breeze, Tube feeding  DVT prophylaxis: SCDs Start: 02/13/23 1735 Eliquis Code Status:   Code Status: Limited: Do not attempt resuscitation (DNR) -DNR-LIMITED -Do Not Intubate/DNI  Family Communication: plan of care discussed with patient/NO FAMILY at bedside. Called to udpated her daughter but no answer 11/28. Was able to update daughter 11/29 and agrees for SNF [facility] AWARE Patient status is: Inpatient because of encephalopathy and malnutrition Level of care: Progressive   Dispo: The patient is from: HOME, with daughter            Anticipated disposition: Agreeable for SNF.  Awaiting for bed.  Anticipated tomorrow.  Objective: Vitals last 24 hrs: Vitals:   03/03/23 2054 03/03/23 2302 03/04/23 0500 03/04/23 0506  BP: 108/78 113/76  123/70  Pulse: (!) 151 84  86  Resp: 20 20  (!) 21  Temp: 98.2 F (36.8 C) 97.7 F (36.5 C)  98 F (36.7 C)  TempSrc: Oral Oral  Oral  SpO2: 96% 96%  94%  Weight:   43.4 kg    Height:       Weight change: -1.6 kg  Physical Examination: General exam: alert awake, HEENT:Oral mucosa moist, Ear/Nose WNL grossly Respiratory system: Bilaterally clear BS,no use of accessory muscle Cardiovascular system: S1 & S2 +, No JVD. Gastrointestinal system: Abdomen soft,NT,ND, BS+ Nervous System: Alert, awake, moving all extremities,and following commands. Extremities: LE edema neg,distal peripheral pulses palpable and warm.  Skin: No rashes,no icterus. MSK: Normal muscle bulk,tone, power   Medications reviewed:  Scheduled Meds:  (feeding supplement) PROSource Plus  30 mL Oral TID BM   apixaban  5 mg Oral BID   cefadroxil  1,000 mg Oral BID   diltiazem  30 mg Oral Q8H   dronabinol  2.5 mg Oral BID AC   feeding supplement  1 Container Oral TID BM   Gerhardt's butt cream   Topical TID   influenza vaccine adjuvanted  0.5 mL Intramuscular Tomorrow-1000   pantoprazole  40 mg Oral Daily   potassium chloride  40 mEq Oral Q3H   propranolol  20 mg Oral TID   spironolactone  50 mg Oral Daily  Continuous Infusions:  magnesium sulfate bolus IVPB     potassium chloride       Diet Order             DIET DYS 3 Room service appropriate? Yes; Fluid consistency: Thin; Fluid restriction: 1500 mL Fluid  Diet effective now                   Intake/Output Summary (Last 24 hours) at 03/04/2023 0857 Last data filed at 03/03/2023 1508 Gross per 24 hour  Intake 480 ml  Output --  Net 480 ml   Net IO Since Admission: 3,729.3 mL [03/04/23 0857]  Wt Readings from Last 3 Encounters:  03/04/23 43.4 kg  06/30/21 49.9 kg  07/10/20 54.4 kg     Unresulted Labs (From admission, onward)     Start     Ordered   02/22/23 1328  Acid Fast Culture with reflexed sensitivities  RELEASE UPON ORDERING,   TIMED        02/22/23 1328   02/14/23 0500  CBC with Differential  Daily,   R,   Status:  Canceled      02/13/23 1733          Data Reviewed: I have personally reviewed following  labs and imaging studies CBC: Recent Labs  Lab 02/27/23 0531 03/02/23 1235  WBC 6.1 8.1  HGB 10.2* 10.2*  HCT 31.3* 32.7*  MCV 95.1 98.8  PLT 190 274   Basic Metabolic Panel:  Recent Labs  Lab 02/27/23 0531 03/02/23 1235 03/04/23 0645  NA 130* 129* 135  K 4.1 3.6 2.8*  CL 102 101 110  CO2 23 21* 16*  GLUCOSE 108* 94 221*  BUN 13 12 11   CREATININE 0.42* 0.43* <0.30*  CALCIUM 7.4* 7.5* 7.5*  MG  --   --  1.7   GFR: CrCl cannot be calculated (This lab value cannot be used to calculate CrCl because it is not a number: <0.30). Liver Function Tests:  Recent Labs  Lab 02/27/23 0531 03/02/23 1235  AST 57* 50*  ALT 19 25  ALKPHOS 141* 145*  BILITOT 0.6 0.8  PROT 5.6* 5.7*  ALBUMIN 1.8* 1.8*   No results for input(s): "LIPASE", "AMYLASE" in the last 168 hours. No results for input(s): "AMMONIA" in the last 168 hours. Coagulation Profile:  No results for input(s): "INR", "PROTIME" in the last 168 hours.  Recent Labs  Lab 03/03/23 1622 03/03/23 2056 03/03/23 2358 03/04/23 0403 03/04/23 0740  GLUCAP 151* 104* 204* 160* 79   Recent Results (from the past 240 hour(s))  Aerobic/Anaerobic Culture w Gram Stain (surgical/deep wound)     Status: None   Collection Time: 02/22/23  1:26 PM   Specimen: PATH Cytology Peritoneal fluid  Result Value Ref Range Status   Specimen Description   Final    PERITONEAL Performed at [facility], [address] [place], [place] [address]    Special Requests   Final    NONE Performed at [facility], [address], [place], [place] [address]    Gram Stain   Final    CYTOSPIN SMEAR WBC PRESENT,BOTH PMN AND MONONUCLEAR NO ORGANISMS SEEN    Culture   Final    No growth aerobically or anaerobically. Performed at [facility], [address], [place], [place] [address]    Report Status 02/27/2023 FINAL  Final  Acid Fast Smear (AFB)     Status: None   Collection Time: 02/22/23  1:26 PM    Specimen: PATH Cytology Peritoneal fluid  Result Value Ref Range Status   AFB Specimen Processing Concentration  Final   Acid Fast Smear Negative  Final    Comment: (NOTE) Performed At: [facility] [address] [place], [place] [id number] Jolene Schimke MD OZ:[id number]    Source (AFB) PERITONEAL  Final    Comment: Performed at [facility], [address] [place], [place] [address]  Culture, body fluid w Gram Stain-bottle     Status: None   Collection Time: 02/22/23  1:26 PM   Specimen: Peritoneal Washings  Result Value Ref Range Status   Specimen Description PERITONEAL  Final   Special Requests NONE  Final   Gram Stain   Final    CYTOSPIN SMEAR WBC PRESENT,BOTH PMN AND MONONUCLEAR NO ORGANISMS SEEN    Culture   Final    NO GROWTH 5 DAYS Performed at [facility], [address] [place], [place] [address]    Report Status 02/27/2023 FINAL  Final  Gram stain     Status: None   Collection Time: 02/22/23  1:26 PM   Specimen: Peritoneal Washings  Result Value Ref Range Status   Specimen Description PERITONEAL  Final   Special Requests NONE  Final   Gram Stain   Final    NO ORGANISMS SEEN WBC PRESENT, PREDOMINANTLY PMN CYTOSPIN SMEAR Performed at [facility], [address], [place], [place] [address]    Report Status 02/23/2023 FINAL  Final    Antimicrobials: Anti-infectives (From admission, onward)    Start     Dose/Rate Route Frequency Ordered Stop   02/26/23 2200  cefadroxil (DURICEF) capsule 1,000 mg        1,000 mg Oral 2 times daily 02/26/23 1444 03/13/23 2359   02/24/23 2200  cefadroxil (DURICEF) capsule 500 mg  Status:  Discontinued        500 mg Oral 2 times daily 02/24/23 1730 02/26/23 1444   02/16/23 0930  ceFAZolin (ANCEF) IVPB 2g/100 mL premix  Status:  Discontinued        2 g 200 mL/hr over 30 Minutes Intravenous Every 8 hours 02/16/23 0843 02/24/23 1730   02/15/23 1800  vancomycin (VANCOCIN) IVPB 1000 mg/200  mL premix  Status:  Discontinued  1,000 mg 200 mL/hr over 60 Minutes Intravenous Every 48 hours 02/13/23 1754 02/14/23 1437   02/15/23 1000  vancomycin (VANCOREADY) IVPB 750 mg/150 mL  Status:  Discontinued        750 mg 150 mL/hr over 60 Minutes Intravenous Daily 02/14/23 1437 02/16/23 0843   02/14/23 1400  vancomycin (VANCOREADY) IVPB 750 mg/150 mL        750 mg 150 mL/hr over 60 Minutes Intravenous  Once 02/14/23 1122 02/14/23 1501   02/14/23 1000  rifaximin (XIFAXAN) tablet 550 mg  Status:  Discontinued        550 mg Oral 3 times daily 02/14/23 0852 02/14/23 0855   02/14/23 1000  rifaximin (XIFAXAN) tablet 550 mg  Status:  Discontinued        550 mg Oral 2 times daily 02/14/23 0855 02/18/23 1438   02/13/23 2300  piperacillin-tazobactam (ZOSYN) IVPB 3.375 g  Status:  Discontinued        3.375 g 12.5 mL/hr over 240 Minutes Intravenous Every 8 hours 02/13/23 1803 02/16/23 0843   02/13/23 2200  metroNIDAZOLE (FLAGYL) IVPB 500 mg  Status:  Discontinued        500 mg 100 mL/hr over 60 Minutes Intravenous Every 12 hours 02/13/23 1733 02/13/23 1802   02/13/23 2000  ceFEPIme (MAXIPIME) 2 g in sodium chloride 0.9 % 100 mL IVPB  Status:  Discontinued        2 g 200 mL/hr over 30 Minutes Intravenous Every 8 hours 02/13/23 1733 02/13/23 1749   02/13/23 1800  vancomycin (VANCOCIN) IVPB 1000 mg/200 mL premix  Status:  Discontinued        1,000 mg 200 mL/hr over 60 Minutes Intravenous  Once 02/13/23 1745 02/13/23 1802   02/13/23 1100  ceFEPIme (MAXIPIME) 2 g in sodium chloride 0.9 % 100 mL IVPB        2 g 200 mL/hr over 30 Minutes Intravenous  Once 02/13/23 1052 02/13/23 1235   02/13/23 1100  metroNIDAZOLE (FLAGYL) IVPB 500 mg        500 mg 100 mL/hr over 60 Minutes Intravenous  Once 02/13/23 1052 02/13/23 1235   02/13/23 1100  vancomycin (VANCOCIN) IVPB 1000 mg/200 mL premix        1,000 mg 200 mL/hr over 60 Minutes Intravenous  Once 02/13/23 1052 02/13/23 1355       Culture/Microbiology    Component Value Date/Time   SDES  02/22/2023 1326    PERITONEAL Performed at [facility], [address], [place], [place] [address]    SDES PERITONEAL 02/22/2023 1326   SDES PERITONEAL 02/22/2023 1326   SPECREQUEST  02/22/2023 1326    NONE Performed at [facility], [address] [place], [place] [address]    SPECREQUEST NONE 02/22/2023 1326   SPECREQUEST NONE 02/22/2023 1326   CULT  02/22/2023 1326    No growth aerobically or anaerobically. Performed at [facility], [address], [place], [place] [address]    CULT  02/22/2023 1326    NO GROWTH 5 DAYS Performed at [facility], [address], [place], [place] [address]    REPTSTATUS 02/27/2023 FINAL 02/22/2023 1326   REPTSTATUS 02/27/2023 FINAL 02/22/2023 1326   REPTSTATUS 02/23/2023 FINAL 02/22/2023 1326     Radiology Studies: [facility] Chest Port 1 View  Result Date: 03/03/2023 CLINICAL DATA:  Shortness of breath. EXAM: PORTABLE CHEST 1 VIEW COMPARISON:  Chest radiograph dated 02/22/2013 FINDINGS: Moderate bilateral pleural effusions, new on the right  since the prior radiograph. There is associated partial compressive atelectasis of the lower lobes versus pneumonia. Loculated fluid in the left fissure again noted but less conspicuous compared to prior radiograph. No pneumothorax. Stable cardiac silhouette. No acute osseous pathology. IMPRESSION: Moderate bilateral pleural effusions with associated partial compressive atelectasis versus pneumonia. Electronically Signed   By: Elgie Collard M.D.   On: 03/03/2023 15:38     LOS: 19 days   Total time spent in review of labs and imaging, patient evaluation, formulation of plan, documentation and communication with family: 35 minutes  Lanae Boast, MD [facility]  03/04/2023, 8:57 AM

## 2023-03-05 DIAGNOSIS — I4891 Unspecified atrial fibrillation: Secondary | ICD-10-CM | POA: Diagnosis not present

## 2023-03-05 DIAGNOSIS — N179 Acute kidney failure, unspecified: Secondary | ICD-10-CM | POA: Diagnosis not present

## 2023-03-05 DIAGNOSIS — I4892 Unspecified atrial flutter: Secondary | ICD-10-CM | POA: Diagnosis not present

## 2023-03-05 DIAGNOSIS — A419 Sepsis, unspecified organism: Secondary | ICD-10-CM | POA: Diagnosis not present

## 2023-03-05 LAB — BASIC METABOLIC PANEL
Anion gap: 10 (ref 5–15)
BUN: 11 mg/dL (ref 8–23)
CO2: 18 mmol/L — ABNORMAL LOW (ref 22–32)
Calcium: 7.9 mg/dL — ABNORMAL LOW (ref 8.9–10.3)
Chloride: 111 mmol/L (ref 98–111)
Creatinine, Ser: 0.46 mg/dL (ref 0.44–1.00)
GFR, Estimated: >60 mL/min (ref >60)
Glucose, Bld: 119 mg/dL — ABNORMAL HIGH (ref 70–99)
Potassium: 3.4 mmol/L — ABNORMAL LOW (ref 3.5–5.1)
Sodium: 139 mmol/L (ref 135–145)

## 2023-03-05 LAB — GLUCOSE, CAPILLARY
Glucose-Capillary: 104 mg/dL — ABNORMAL HIGH (ref 70–99)
Glucose-Capillary: 107 mg/dL — ABNORMAL HIGH (ref 70–99)
Glucose-Capillary: 108 mg/dL — ABNORMAL HIGH (ref 70–99)
Glucose-Capillary: 242 mg/dL — ABNORMAL HIGH (ref 70–99)
Glucose-Capillary: 80 mg/dL (ref 70–99)
Glucose-Capillary: 90 mg/dL (ref 70–99)

## 2023-03-05 MED ORDER — POTASSIUM CHLORIDE CRYS ER 20 MEQ PO TBCR
20.0000 meq | EXTENDED_RELEASE_TABLET | Freq: Once | ORAL | Status: AC
  Administered 2023-03-05: 20 meq via ORAL
  Filled 2023-03-05: qty 1

## 2023-03-05 MED ORDER — SPIRONOLACTONE 25 MG PO TABS
100.0000 mg | ORAL_TABLET | Freq: Every day | ORAL | Status: DC
  Administered 2023-03-06: 100 mg via ORAL
  Filled 2023-03-05: qty 4

## 2023-03-05 NOTE — TOC Progression Note (Signed)
Transition of Care [facility]) - Progression Note    Patient Details  Name: Anita Price MRN: [id number] Date of Birth: Feb 09, 1956  Transition of [facility] CM/SW Contact  Olusegun Gerstenberger, Olegario Messier, RN Phone Number: 03/05/2023, 10:45 AM  Clinical Narrative:  [facility] rep [place] following for d/c once stable. MD updated.    Expected Discharge Plan: Skilled Nursing Facility Barriers to Discharge: No Barriers Identified  Expected Discharge Plan and Services In-house Referral: Clinical Social Work   Post Acute Care Choice: Home Health Living arrangements for the past 2 months: Apartment                                       Social Determinants of Health (SDOH) Interventions SDOH Screenings   Food Insecurity: No Food Insecurity (02/14/2023)  Housing: Low Risk  (02/14/2023)  Transportation Needs: No Transportation Needs (02/14/2023)  Utilities: Not At Risk (02/14/2023)  Social Connections: Unknown (08/01/2021)   Received from [facility]  Tobacco Use: High Risk (02/17/2023)    Readmission Risk Interventions    02/26/2023   10:09 AM 02/16/2023    3:49 PM 07/13/2020   11:39 AM  Readmission Risk Prevention Plan  Post Dischage Appt   Not Complete  Appt Comments   attempted to call PCP to schedule f/u- however office informed us that patient would need to call for appointment  Medication Screening   Complete  Transportation Screening Complete Not Complete Complete  Transportation Screening Comment  Patient only alert and orieted x1, unable to complete transportation screening.   PCP or Specialist Appt within 5-7 Days  Not Complete   Not Complete comments  Patient only alert and oriented x1 can't schedule an appt.   PCP or Specialist Appt within 3-5 Days Complete    Home Care Screening  Not Complete   Home Care Screening Not Completed Comments  Alert and oriented x1 and not medically ready for HH.   Medication Review (RN CM)  Referral to Pharmacy   HRI or Home Care Consult  Complete    Social Work Consult for Recovery Care Planning/Counseling Complete    Palliative Care Screening Complete    Medication Review Oceanographer) Complete

## 2023-03-05 NOTE — Progress Notes (Addendum)
Patient Name: Anita Price Date of Encounter: 03/05/2023 [facility] Cardiologist: None   Interval Summary  .    Much more alert today and conversational.  Discussed her atrial flutter more in depth.  She feels great today.  No complaints. Vital Signs .    Vitals:   03/04/23 1254 03/04/23 2113 03/05/23 0500 03/05/23 0530  BP: 119/71 135/71  127/79  Pulse: 83 70  67  Resp: 18 20  19   Temp: (!) 97.4 F (36.3 C) (!) 97.4 F (36.3 C)  97.8 F (36.6 C)  TempSrc: Oral Oral  Oral  SpO2: 98% 95%  95%  Weight:   43.3 kg   Height:        Intake/Output Summary (Last 24 hours) at 03/05/2023 0838 Last data filed at 03/04/2023 1850 Gross per 24 hour  Intake 2973.39 ml  Output --  Net 2973.39 ml      03/05/2023    5:00 AM 03/04/2023    5:00 AM 03/03/2023    4:25 AM  Last 3 Weights  Weight (lbs) 95 lb 7.4 oz 95 lb 10.9 oz 99 lb 3.3 oz  Weight (kg) 43.3 kg 43.4 kg 45 kg      Telemetry/ECG    Sinus rhythm heart rates generally in the 60-80s.  However overnight she did have a few episodes of bradycardia (asymptomatic).  Personally Reviewed  CV Studies    Echocardiogram 02/14/2023 1. Left ventricular ejection fraction, by estimation, is 60 to 65%. The  left ventricle has normal function. The left ventricle has no regional  wall motion abnormalities. Left ventricular diastolic parameters were  normal.   2. Right ventricular systolic function is normal. The right ventricular  size is normal.   3. The mitral valve is normal in structure. No evidence of mitral valve  regurgitation. No evidence of mitral stenosis.   4. The aortic valve is normal in structure. Aortic valve regurgitation is  not visualized. No aortic stenosis is present.   5. The inferior vena cava is normal in size with greater than 50%  respiratory variability, suggesting right atrial pressure of 3 mmHg.   Physical Exam .   GEN: cachectic Neck: No JVD Cardiac: RRR, no murmurs, rubs, or gallops.   Respiratory: Crackles in left lower lobe GI: Soft, nontender, non-distended  MS: No edema  Patient Profile    Anita Price is a 67 y.o. female has hx of chronic hepatitis C, liver cirrhosis, tobacco abuse, chronic pain syndrome, hypertension, thyroid nodule.  She has had a prolonged admission since 02/13/2023 for significantly present today with generalized weakness with recurrent nausea and vomiting.  She is currently being evaluated for left-sided empyema status post chest tube insertion, right lower lobe pulmonary embolus, status postthoracentesis for large loculated pleural effusion, status post paracentesis,  Assessment & Plan .     New onset atrial flutter/fibrillation with RVR Was having recurrent episodes of narrow complex tachycardia consistent with flutter/fibrillation with heart rates in the 150s.  Spontaneously converted back to sinus and maintaining.  We consolidated her medications yesterday to propranolol 40 mg 3 times daily.  She is tolerating this well without any issues, had some bradycardic episodes while sleeping, but appropriate HR while awake and denies any symptoms with this. Continue Eliquis.  Continue propranolol 40 mg 3 times daily  (cirrhosis indication). TSH normal. Does not fall at home.  Maintain potassium greater than 4, magnesium greater than 2. Echo showed preserved EF 60-65% with normal RV function, euvolemic.  Hypokalemia Potassium yesterday was 2.8, started on oral and IV potassium and mag (1.7).  Be mindful of aggressive repletion given she is also on spiro 50 mg. Awaiting labs.   Acute RLL PE with splenic infarcts Continue Eliquis  Metabolic encephalopathy Cirrhosis, hepatitis C Improving   Cachectic/failure to thrive Palliative care following  Streptococcus intermedius empyema status post thoracentesis/chest tube Per primary team.  She will follow-up with our atrial fibrillation clinic in Jan.   For questions or updates, please contact  [facility] Please consult [email or web address] for contact info under     Signed, Abagail Kitchens, PA-C    ADDENDUM:   Patient seen and examined with Abagail Kitchens, PA-C  .  I personally taken a history, examined the patient, reviewed relevant notes,  laboratory data / imaging studies.  I performed a substantive portion of this encounter and formulated the important aspects of the plan.  I agree with the APP's note, impression, and recommendations; however, I have edited the note to reflect changes or salient points.  Patient seen and examined at bedside. Much more awake and alert compared to her admission Wishes to go home.  PHYSICAL EXAM: Today's Vitals   03/05/23 1000 03/05/23 1142 03/05/23 1655 03/05/23 1732  BP: 133/88 119/68    Pulse: 82 70    Resp:  (!) 24    Temp:      TempSrc:      SpO2:  100%    Weight:      Height:      PainSc:   8  Asleep   Body mass index is 15.89 kg/m.   Net IO Since Admission: 6,752.69 mL [03/05/23 1947]  Filed Weights   03/03/23 0425 03/04/23 0500 03/05/23 0500  Weight: 45 kg 43.4 kg 43.3 kg    Physical Exam  Constitutional: She appears cachectic. No distress. She appears chronically ill.  hemodynamically stable  HENT:  Dry oral membranes, poor dentition  Neck: No JVD present.  Cardiovascular: Normal rate, regular rhythm, S1 normal and S2 normal. Exam reveals no gallop, no S3 and no S4.  No murmur heard. Pulmonary/Chest: Effort normal and breath sounds normal. No stridor. She has no wheezes. She has no rales.  Abdominal: Soft. Bowel sounds are normal. She exhibits no distension. There is no abdominal tenderness.  Musculoskeletal:        General: No edema.     Cervical back: Neck supple.  Neurological: She is alert and oriented to person, place, and time. She has intact cranial nerves (2-12).  Skin: Skin is warm.    EKG: (personally reviewed by me) 03/04/2023: Sinus rhythm 81 bpm, prolonged QT interval  Telemetry:  (personally reviewed by me) Sinus rhythm with episodes of bradycardia (at night, asymptomatic)   Impression:  Paroxysmal atrial fibrillation/flutter. Long-term oral anticoagulation. Prolonged QT. Hypomagnesemia.  Hypokalemia. Acute right lower lobe PE Splenic infarcts on CT imaging Metabolic encephalopathy-improving. Cirrhosis, hepatitis C  Recommendations:  Paroxysmal atrial fibrillation/flutter: Newly discovered during this hospitalization. Likely precipitated by acute illness-see prior progress notes. Spontaneously converted to normal sinus rhythm as the underlying acute illnesses are resolving and with AV nodal blocking agents. She was transition to propranolol 40 mg p.o. 3 times daily with holding parameters. Currently on Eliquis given her acute PE. Long-term anticoagulation will need to be rate discussed once she is treated for PE. On reconsidering anticoagulation for paroxysmal A-fib/flutter please keep in mind her chest Vascor and CT findings of splenic infarct. Patient is encouraged to follow-up as outpatient.  Since she is more awake and alert during today's encounter discussed the pathophysiology of atrial fibrillation and flutter and thromboembolic prophylaxis for stroke prevention.    Advised patient to alert all providers of anticoagulation therapy prior to starting a new medication or having a procedure. Emphasized importance of monitoring for signs and symptoms of bleeding (abnormal bruising, prolonged bleeding, nose bleeds, bleeding from gums, discolored urine, black tarry stools) and to seek medical attention if present.  Patient is educated on fall precautions and if she has is injured despite the mechanism of injury she needs to seek medical attention by going to the closest ER to be evaluated as since she is on blood thinners.   Patient understands importance of this because if internal bleeding is not treated in a timely manner it can further lead to morbidity and/or  mortality.   Patient voices understanding of these recommendations and provides verbal feedback.   Patient is aware that being on blood thinners does predispose her to complications of bleeding and therefore she needs to be cognizant for signs for bleeding.  Risks, benefits, and alternatives to anticoagulation discussed.  Patient is agreeable to remain on anticoagulation.  Prolonged QT: Likely precipitated by electrolyte abnormalities. Once electrolyte normalities are corrected recommend repeating EKG. Also will defer to attending physician with regards to avoiding medications that prolong QT-consider discussing with pharmacy  Hypokalemia/hypomagnesemia: Electrolyte replacement per attending physician.  Acute right lower lobe PE: Currently on anticoagulation.  In the past, I have advised if she would like me to talk to her daughter and update her from a cardiovascular standpoint.  She has denied.  [facility] will sign off.   Medication Recommendations:  propanolol and anticoagulation.  Other recommendations (labs, testing, etc):  check cbc and bmp every 6 month atleast as she is on blood thinner.  Follow up as an outpatient:  Cardiology and Afib clinic.     This note was created using a voice recognition software as a result there may be grammatical errors inadvertently enclosed that do not reflect the nature of this encounter. Every attempt is made to correct such errors.   Tessa Lerner, DO, [facility]  [address] [place], [place] [address] Pager: [phone] Office: [phone] 03/05/2023 7:47 PM

## 2023-03-05 NOTE — Plan of Care (Signed)
  Problem: Coping: Goal: Level of anxiety will decrease Outcome: Progressing   Problem: Pain Management: Goal: General experience of comfort will improve Outcome: Progressing   Problem: Safety: Goal: Ability to remain free from injury will improve Outcome: Progressing   Problem: Skin Integrity: Goal: Risk for impaired skin integrity will decrease Outcome: Progressing

## 2023-03-05 NOTE — Plan of Care (Signed)
  Problem: Clinical Measurements: Goal: Diagnostic test results will improve Outcome: Progressing   Problem: Clinical Measurements: Goal: Signs and symptoms of infection will decrease Outcome: Progressing   Problem: Education: Goal: Knowledge of General Education information will improve Description: Including pain rating scale, medication(s)/side effects and non-pharmacologic comfort measures Outcome: Progressing   Problem: Health Behavior/Discharge Planning: Goal: Ability to manage health-related needs will improve Outcome: Progressing

## 2023-03-05 NOTE — Progress Notes (Signed)
[facility] PROGRESS NOTE    Progress Note  Anita Price  BMW:[id number] DOB: 11/05/1955 DOA: 02/13/2023 PCP: Jackie Plum, MD     Brief Narrative:   Anita Price is an 67 y.o. female past medical history of chronic hepatitis C, liver cirrhosis untreated, tobacco abuse, chronic pain syndrome, cervical radiculopathy status post C3-C6 with anterior cervical discectomy and fusion, followed by Dr. Yevette Edwards, follow-up with the pain clinic in [place], essential hypertension brought into the ED on 02/13/2023 for nausea vomiting that started 3 to 4 days prior to admission.  In the ED CT of the chest showed a large loculated pleural effusion associated with complete collapse of the left lung and several splenic infarcts PCCM was consulted underwent thoracocentesis by IR chest tube placement by pulmonary with Lytics given, second chest tube placed by IR, which were eventually removed.  Has had a prolonged hospitalization with multiple procedures.    Significant Events: 11/14: Admitted on antibiotics, thoracentesis with purulent material 11/15: Pulmonology consulted, chest tube inserted 11/16: Breathing worse; Up to 35L HFNC; Pleural Lytics given 11/18: Second chest tube placed by IR 11/19: Dobbhoff Cortrak placed--- palliative medicine consulted additionally for goals of care--remains full code. 11/22: Lower right-sided chest tube removed 11/23: all chest tubes removed 11/23: abd paracentesis done 450 cc 11/28: Doing well oriented NG tube feeding, FMS and Foley catheter discontinued 12/2: Tachycardia A-fib with RVR cardiology consulted admitted Cystaid with propranolol, cardizem.  Significant studies: 11/14 CT head: NAD 11/14 CT chest: large left pleural effusion 11/15 Echo: Normal EF, normal valves 11/18 CT chest repeat moderate large volume left pleural effusion 20 cm greatest dimension, left base pigtail catheter drainage subscribe pragmatic right basilar nodular pulmonary  opacities multifocal pneumonia 11/22 CT chest = substantial improvement left empyema lateral component completely evacuated residue 2 loculations 2.8 X3.9, 2.4 X3.1 11/22 CT abdomen pelvis cirrhosis liver portal hypertension gastrorenal shunt distal varices moderate ascites anasarca-probable splenic infarct?  Laceration seems stable from prior with no extravasation?  Subsegmental pulmonary embolus posterior basilar right lower lobe 11/20 repeat CT chest with contrast shows right lower lobe segmental and subsegmental emboli no heart strain loculated effusion left with pleural thickening left lower lobe atelectasis small right pleural effusion with patchy airspace disease and cirrhosis?  Splenic infarct again seen  Antibiotics: Initially started on cefepime and vancomycin from 02/13/2023 to 02/16/2023. 02/16/2023 cefazolin  Microbiology data: Blood culture:  Procedures: None  Assessment/Plan:   Sepsis due to Streptococcus intermedius empyema (HCC)/leaking chest tube/left lower lobe ostomy in place now Status post multiple procedures including thoracocentesis 02/13/2023. Left lower lobe chest tube initially placed on 02/14/2023 status post lytic. Additional chest tube placed on 02/17/2023 and left lower chest tube removed 02/21/2023 and left anterior chest tube removed on 02/23/2023.  She had an ostomy bag placed at the site of the left lower chest tube was removed on 03/02/2023. Now on cefadroxil with end date of 03/13/2023. PT OT was consulted will need skilled nursing facility.  Paroxysmal A-fib with RVR: Propranolol was continue, likely precipitated by acute illness likely empyema. Currently on propranolol (which is primarily a cirrhosis indication) is helping with rate control. Continue Eliquis. Patient spontaneously converted to sinus rhythm. She was previously on Coreg and Cardizem cardiology wisely simplify her regimen to propranolol at a higher dose with holding parameters.  Right  lower lobe pulmonary embolism: Asymptomatic continue Eliquis.  Acute metabolic encephalopathy:/Hepatic encephalopathy: Appears weak and frail. Continue to work with PT OT.  Diarrhea: While on the feeding tube  these have been weaned off diarrhea has resolved. Imodium as needed.  Dysphagia: A dysphagia 3 diet.  Hypokalemia: Continue Aldactone  Hepatitis C with cirrhosis splenic sequestration/ascites/abdominal pain with splenic/decompensated cirrhosis of the liver: No evidence of BP. Continue Aldactone propranolol lactulose as needed to avoid constipation.  Chronic hyponatremia secondary to cirrhosis/hypokalemia/metabolic acidosis: Hyponatremia has resolved still remains hypokalemic replete orally recheck in the morning she is on Aldactone.  Chronic pain from ACDF: Lyrica was stopped, continue Flexeril at bedtime. She follows at the pain clinic [facility] 7.5 every 4 as needed  Degenerative disc disease:  Endometrial thickening: Follow-up with GYN as an outpatient.  Thyroid nodule: Follow-up with PCP as an outpatient.  Failure to thrive/debility/deconditioning: Palliative care is following, she has a core track for diet. Overall has poor prognosis  Protein caloric malnutrition: Now off feeding tube.  Ensure 3 times daily.   DVT prophylaxis: Now on Eliquis. Family Communication: No family at bedside. Status is: Inpatient Remains inpatient appropriate because: A-fib with RVR    Code Status:     Code Status Orders  (From admission, onward)           Start     Ordered   02/21/23 1517  Do not attempt resuscitation (DNR)- Limited -Do Not Intubate (DNI)  (Code Status)  Continuous       Question Answer Comment  If pulseless and not breathing No CPR or chest compressions.   In Pre-Arrest Conditions (Patient Is Breathing and Has A Pulse) Do not intubate. Provide all appropriate non-invasive medical interventions. Avoid ICU transfer unless indicated or required.    Consent: Discussion documented in EHR or advanced directives reviewed      02/21/23 1517           Code Status History     Date Active Date Inactive Code Status Order ID Comments User Context   02/13/2023 1736 02/21/2023 1517 Full Code [id number]  Bobette Mo, MD ED   06/30/2021 1709 07/03/2021 1826 Full Code [id number]  Orland Mustard, MD ED   07/10/2020 0348 07/13/2020 1836 Full Code [id number]  Bobette Mo, MD ED         IV Access:   Peripheral IV   Procedures and diagnostic studies:   DG Chest Port 1 View  Result Date: 03/03/2023 CLINICAL DATA:  Shortness of breath. EXAM: PORTABLE CHEST 1 VIEW COMPARISON:  Chest radiograph dated 02/22/2013 FINDINGS: Moderate bilateral pleural effusions, new on the right since the prior radiograph. There is associated partial compressive atelectasis of the lower lobes versus pneumonia. Loculated fluid in the left fissure again noted but less conspicuous compared to prior radiograph. No pneumothorax. Stable cardiac silhouette. No acute osseous pathology. IMPRESSION: Moderate bilateral pleural effusions with associated partial compressive atelectasis versus pneumonia. Electronically Signed   By: Elgie Collard M.D.   On: 03/03/2023 15:38     Medical Consultants:   None.   Subjective:    Anita Price no complaints.  Objective:    Vitals:   03/04/23 2113 03/05/23 0500 03/05/23 0530 03/05/23 1000  BP: 135/71  127/79 133/88  Pulse: 70  67 82  Resp: 20  19   Temp: (!) 97.4 F (36.3 C)  97.8 F (36.6 C)   TempSrc: Oral  Oral   SpO2: 95%  95%   Weight:  43.3 kg    Height:       SpO2: 95 % O2 Flow Rate (L/min): 2 L/min FiO2 (%): 47 %   Intake/Output Summary (Last 24  hours) at 03/05/2023 1011 Last data filed at 03/05/2023 1000 Gross per 24 hour  Intake 3023.39 ml  Output --  Net 3023.39 ml   Filed Weights   03/03/23 0425 03/04/23 0500 03/05/23 0500  Weight: 45 kg 43.4 kg 43.3 kg    Exam: General exam: In  no acute distress. Respiratory system: Good air movement and clear to auscultation. Cardiovascular system: S1 & S2 heard, RRR. No JVD. Gastrointestinal system: Abdomen is nondistended, soft and nontender.  Extremities: No pedal edema. Skin: No rashes, lesions or ulcers Psychiatry: Judgement and insight appear normal. Mood & affect appropriate.    Data Reviewed:    Labs: Basic Metabolic Panel: Recent Labs  Lab 02/27/23 0531 03/02/23 1235 03/04/23 0645 03/04/23 1035  NA 130* 129* 135 138  K 4.1 3.6 2.8* 2.9*  CL 102 101 110 111  CO2 23 21* 16* 20*  GLUCOSE 108* 94 221* 97  BUN 13 12 11 9   CREATININE 0.42* 0.43* <0.30* <0.30*  CALCIUM 7.4* 7.5* 7.5* 7.8*  MG  --   --  1.7  --    GFR CrCl cannot be calculated (This lab value cannot be used to calculate CrCl because it is not a number: <0.30). Liver Function Tests: Recent Labs  Lab 02/27/23 0531 03/02/23 1235  AST 57* 50*  ALT 19 25  ALKPHOS 141* 145*  BILITOT 0.6 0.8  PROT 5.6* 5.7*  ALBUMIN 1.8* 1.8*   No results for input(s): "LIPASE", "AMYLASE" in the last 168 hours. Recent Labs  Lab 03/04/23 1035  AMMONIA 36*   Coagulation profile No results for input(s): "INR", "PROTIME" in the last 168 hours. COVID-19 Labs  No results for input(s): "DDIMER", "FERRITIN", "LDH", "CRP" in the last 72 hours.  Lab Results  Component Value Date   SARSCOV2NAA NEGATIVE 06/30/2021   SARSCOV2NAA NEGATIVE 07/10/2020    CBC: Recent Labs  Lab 02/27/23 0531 03/02/23 1235  WBC 6.1 8.1  HGB 10.2* 10.2*  HCT 31.3* 32.7*  MCV 95.1 98.8  PLT 190 274   Cardiac Enzymes: No results for input(s): "CKTOTAL", "CKMB", "CKMBINDEX", "TROPONINI" in the last 168 hours. BNP (last 3 results) No results for input(s): "PROBNP" in the last 8760 hours. CBG: Recent Labs  Lab 03/04/23 1611 03/04/23 2115 03/05/23 0016 03/05/23 0433 03/05/23 0741  GLUCAP 155* 81 90 104* 80   D-Dimer: No results for input(s): "DDIMER" in the last 72  hours. Hgb A1c: No results for input(s): "HGBA1C" in the last 72 hours. Lipid Profile: No results for input(s): "CHOL", "HDL", "LDLCALC", "TRIG", "CHOLHDL", "LDLDIRECT" in the last 72 hours. Thyroid function studies: Recent Labs    03/03/23 1546  TSH 0.441   Anemia work up: No results for input(s): "VITAMINB12", "FOLATE", "FERRITIN", "TIBC", "IRON", "RETICCTPCT" in the last 72 hours. Sepsis Labs: Recent Labs  Lab 02/27/23 0531 03/02/23 1235  WBC 6.1 8.1   Microbiology No results found for this or any previous visit (from the past 240 hour(s)).   Medications:    (feeding supplement) PROSource Plus  30 mL Oral TID BM   apixaban  5 mg Oral BID   cefadroxil  1,000 mg Oral BID   dronabinol  2.5 mg Oral BID AC   feeding supplement  1 Container Oral TID BM   Gerhardt's butt cream   Topical TID   influenza vaccine adjuvanted  0.5 mL Intramuscular Tomorrow-1000   pantoprazole  40 mg Oral Daily   propranolol  40 mg Oral TID   spironolactone  50 mg Oral  Daily   Continuous Infusions:    LOS: 20 days   Marinda Elk  [facility]  03/05/2023, 10:11 AM

## 2023-03-06 DIAGNOSIS — A419 Sepsis, unspecified organism: Secondary | ICD-10-CM | POA: Diagnosis not present

## 2023-03-06 DIAGNOSIS — G9341 Metabolic encephalopathy: Secondary | ICD-10-CM | POA: Diagnosis not present

## 2023-03-06 DIAGNOSIS — J869 Pyothorax without fistula: Secondary | ICD-10-CM | POA: Diagnosis not present

## 2023-03-06 DIAGNOSIS — N179 Acute kidney failure, unspecified: Secondary | ICD-10-CM | POA: Diagnosis not present

## 2023-03-06 LAB — BASIC METABOLIC PANEL
Anion gap: 9 (ref 5–15)
BUN: 12 mg/dL (ref 8–23)
CO2: 19 mmol/L — ABNORMAL LOW (ref 22–32)
Calcium: 8.1 mg/dL — ABNORMAL LOW (ref 8.9–10.3)
Chloride: 112 mmol/L — ABNORMAL HIGH (ref 98–111)
Creatinine, Ser: 0.48 mg/dL (ref 0.44–1.00)
GFR, Estimated: >60 mL/min (ref >60)
Glucose, Bld: 125 mg/dL — ABNORMAL HIGH (ref 70–99)
Potassium: 3.6 mmol/L (ref 3.5–5.1)
Sodium: 140 mmol/L (ref 135–145)

## 2023-03-06 LAB — GLUCOSE, CAPILLARY
Glucose-Capillary: 192 mg/dL — ABNORMAL HIGH (ref 70–99)
Glucose-Capillary: 79 mg/dL (ref 70–99)
Glucose-Capillary: 82 mg/dL (ref 70–99)

## 2023-03-06 MED ORDER — PROPRANOLOL HCL 40 MG PO TABS: 40.0000 mg | ORAL_TABLET | Freq: Three times a day (TID) | ORAL | Status: AC

## 2023-03-06 MED ORDER — SPIRONOLACTONE 100 MG PO TABS: 100.0000 mg | ORAL_TABLET | Freq: Every day | ORAL | Status: DC

## 2023-03-06 MED ORDER — CEFADROXIL 500 MG PO CAPS: 1000.0000 mg | ORAL_CAPSULE | Freq: Two times a day (BID) | ORAL | Status: AC

## 2023-03-06 MED ORDER — MORPHINE SULFATE 15 MG PO TABS: 7.5000 mg | ORAL_TABLET | ORAL | 0 refills | Status: DC | PRN

## 2023-03-06 MED ORDER — PANTOPRAZOLE SODIUM 40 MG PO TBEC: 40.0000 mg | DELAYED_RELEASE_TABLET | Freq: Every day | ORAL | Status: AC

## 2023-03-06 MED ORDER — APIXABAN 5 MG PO TABS: 5.0000 mg | ORAL_TABLET | Freq: Two times a day (BID) | ORAL | Status: AC

## 2023-03-06 NOTE — Care Management Important Message (Signed)
Important Message  Patient Details IM Letter given. Name: Anita Price MRN: [id number] Date of Birth: 1955-08-20   Important Message Given:  Yes - Medicare IM     Caren Macadam 03/06/2023, 10:04 AM

## 2023-03-06 NOTE — Plan of Care (Signed)
  Problem: Elimination: Goal: Will not experience complications related to bowel motility Outcome: Progressing   Problem: Pain Management: Goal: General experience of comfort will improve Outcome: Progressing   Problem: Safety: Goal: Ability to remain free from injury will improve Outcome: Progressing   Problem: Skin Integrity: Goal: Risk for impaired skin integrity will decrease Outcome: Progressing

## 2023-03-06 NOTE — Progress Notes (Signed)
Pt being d/c, VSS, IV 4 removed, Education complete.    Balinda Quails, RN 03/06/2023 10:19 AM

## 2023-03-06 NOTE — TOC Transition Note (Signed)
Transition of Care [facility]) - CM/SW Discharge Note   Patient Details  Name: Anita Price MRN: [id number] Date of Birth: 07-27-1955  [facility] CM/SW Contact:  Lanier Clam, RN Phone Number: 03/06/2023, 9:47 AM   Clinical Narrative: accepted @ [facility] rep Charlcie Cradle aware-going to rm#[id number],report tel#[phone], 1st floor nursing station.DNR.[email or web address] forms in printer. PTAR called. No further CM needs.      Final next level of care: Skilled Nursing Facility Barriers to Discharge: No Barriers Identified   Patient Goals and CMS Choice      Discharge Placement PASRR number recieved: 03/04/23 PASRR number recieved: 03/04/23            Patient chooses bed at: Other - please specify in the comment section below: [facility]) Patient to be transferred to facility by: PTAR Name of family member notified: Melissa(dtr) Patient and family notified of of transfer: 03/06/23  Discharge Plan and Services Additional resources added to the After Visit Summary for   In-house Referral: Clinical Social Work   Post Acute Care Choice: Home Health                               Social Determinants of Health (SDOH) Interventions SDOH Screenings   Food Insecurity: No Food Insecurity (02/14/2023)  Housing: Low Risk  (02/14/2023)  Transportation Needs: No Transportation Needs (02/14/2023)  Utilities: Not At Risk (02/14/2023)  Social Connections: Unknown (08/01/2021)   Received from [facility]  Tobacco Use: High Risk (02/17/2023)     Readmission Risk Interventions    02/26/2023   10:09 AM 02/16/2023    3:49 PM 07/13/2020   11:39 AM  Readmission Risk Prevention Plan  Post Dischage Appt   Not Complete  Appt Comments   attempted to call PCP to schedule f/u- however office informed us that patient would need to call for appointment  Medication Screening   Complete  Transportation Screening Complete Not Complete Complete  Transportation Screening Comment  Patient  only alert and orieted x1, unable to complete transportation screening.   PCP or Specialist Appt within 5-7 Days  Not Complete   Not Complete comments  Patient only alert and oriented x1 can't schedule an appt.   PCP or Specialist Appt within 3-5 Days Complete    Home Care Screening  Not Complete   Home Care Screening Not Completed Comments  Alert and oriented x1 and not medically ready for HH.   Medication Review (RN CM)  Referral to Pharmacy   HRI or Home Care Consult Complete    Social Work Consult for Recovery Care Planning/Counseling Complete    Palliative Care Screening Complete    Medication Review Oceanographer) Complete

## 2023-03-06 NOTE — Discharge Summary (Signed)
Physician Discharge Summary  Anita Price ZOX:[id number] DOB: Feb 21, 1956 DOA: 02/13/2023  PCP: Jackie Plum, MD  Admit date: 02/13/2023 Discharge date: 03/06/2023  Admitted From: Home Disposition:  SNF  Recommendations for Outpatient Follow-up:  Follow up with PCP in 1-2 weeks Please obtain BMP/CBC in one week End date of antibiotics of 03/21/2023. Follow-up with GYN as an outpatient for endometrial thickening.    Home Health:No Equipment/Devices:None  Discharge Condition:Guarded CODE STATUS:DNR Diet recommendation: Heart Healthy   Brief/Interim Summary: 67 y.o. female past medical history of chronic hepatitis C, liver cirrhosis untreated, tobacco abuse, chronic pain syndrome, cervical radiculopathy status post C3-C6 with anterior cervical discectomy and fusion, followed by Dr. Yevette Edwards, follow-up with the pain clinic in [place], essential hypertension brought into the ED on 02/13/2023 for nausea vomiting that started 3 to 4 days prior to admission.  In the ED CT of the chest showed a large loculated pleural effusion associated with complete collapse of the left lung and several splenic infarcts PCCM was consulted underwent thoracocentesis by IR chest tube placement by pulmonary with Lytics given, second chest tube placed by IR, which were eventually removed.  Has had a prolonged hospitalization with multiple procedures.     Significant Events: 11/14: Admitted on antibiotics, thoracentesis with purulent material 11/15: Pulmonology consulted, chest tube inserted 11/16: Breathing worse; Up to 35L HFNC; Pleural Lytics given 11/18: Second chest tube placed by IR 11/19: Dobbhoff Cortrak placed--- palliative medicine consulted additionally for goals of care--remains full code. 11/22: Lower right-sided chest tube removed 11/23: all chest tubes removed 11/23: abd paracentesis done 450 cc 11/28: Doing well oriented NG tube feeding, FMS and Foley catheter discontinued 12/2:  Tachycardia A-fib with RVR cardiology consulted admitted Cystaid with propranolol, cardizem.   Significant studies: 11/14 CT head: NAD 11/14 CT chest: large left pleural effusion 11/15 Echo: Normal EF, normal valves 11/18 CT chest repeat moderate large volume left pleural effusion 20 cm greatest dimension, left base pigtail catheter drainage subscribe pragmatic right basilar nodular pulmonary opacities multifocal pneumonia 11/22 CT chest = substantial improvement left empyema lateral component completely evacuated residue 2 loculations 2.8 X3.9, 2.4 X3.1 11/22 CT abdomen pelvis cirrhosis liver portal hypertension gastrorenal shunt distal varices moderate ascites anasarca-probable splenic infarct?  Laceration seems stable from prior with no extravasation?  Subsegmental pulmonary embolus posterior basilar right lower lobe 11/20 repeat CT chest with contrast shows right lower lobe segmental and subsegmental emboli no heart strain loculated effusion left with pleural thickening left lower lobe atelectasis small right pleural effusion with patchy airspace disease and cirrhosis?  Splenic infarct again seen   Antibiotics: Initially started on cefepime and vancomycin from 02/13/2023 to 02/16/2023. 02/16/2023 cefazolin 02/24/2023. 02/24/2023 till 03/13/2023 cefadroxil  Discharge Diagnoses:  Principal Problem:   Sepsis due to Streptococcus intermedius empyema (HCC) Active Problems:   Empyema (HCC)   Acute metabolic encephalopathy   Essential hypertension   Tobacco use   Hyponatremia   Prolonged QT interval   Hepatic cirrhosis in setting of untreated hepatitis C    AKI (acute kidney injury) (HCC)   Abnormal ultrasound of uterus   Hypokalemia   Uncomplicated opioid dependence (HCC)   Thrombocytopenia (HCC)   Hypophosphatemia   Lactic acidosis   Malnutrition of moderate degree   Palliative care encounter   Goals of care, counseling/discussion   Counseling and coordination of care   Need for  emotional support   Sepsis (HCC)   DNR (do not resuscitate) discussion   Poor appetite   Medication management  Atrial fibrillation and flutter (HCC)   Pulmonary embolus (HCC)   Paroxysmal atrial fibrillation (HCC)   Paroxysmal atrial flutter (HCC)   Prolonged Q-T interval on ECG   Hypomagnesemia  Sepsis due to Streptococcus intermedius empyema/leaky chest tube/left lower lobe ostomy/multiple procedures including thoracocentesis on 02/13/2023 ago: Left lower lobe chest tube was initially placed on 02/14/2023 status post lytic therapy. An additional chest tube was placed on 02/17/2023 and the left lower chest tube was removed 02/21/2023, the left anterior chest tube was removed in 11-24 2024. She had an ostomy bag in the site where the chest tube was placed for drainage it was removed on 03/02/2023. Pulmonary and critical care recommended to continue antibiotics with an end date of 03/13/2023.  Paroxysmal fibrillation with RVR: Likely precipitated by acute illness empyema. Cardiology was consulted and her regimen was simplified to propranolol, she eventually converted to sinus rhythm. She will continue Eliquis and outpatient.  Right pulmonary embolism: Asymptomatic continue Eliquis.  Acute metabolic encephalopathy/hepatic encephalopathy: She was given lactulose now resolved. She was taken off the lactulose her encephalopathy is resolved and mentation remained stable.  Diarrhea: Due to feeding tube now that is resolved.  Dysphagia: Continue dysphagia 3 diet.  Hypokalemia: She was continued on Aldactone and potassium remained stable.  Hepatitis C with cirrhosis/splenic sequestration/ascites abdominal pain/decompensated liver cirrhosis: No evidence of SBP. Will continue lactulose as needed, she will continue oral Aldactone her potassium has remained stable.  Chronic hyponatremia secondary to cirrhosis/hypokalemia/metabolic acidosis: Hyponatremia resolved with IV  fluids. Potassium was replete orally and she was started on Aldactone for cirrhosis which should help with her potassium and her potassium has remained stable.  Chronic pain from ACDF: Continue narcotics no changes made.  DJD disease: Noted.  Endometrial thickening: Follow-up with GYN as an outpatient.  Thyroid nodule: Follow-up with PCP as an outpatient.  Failure to thrive/debility: PT OT was consulted she will go to skilled nursing facility.  Protein caloric malnutrition: Feeding tube continue Ensure 3 times daily.     Discharge Instructions  Discharge Instructions     Diet - low sodium heart healthy   Complete by: As directed    Discharge wound care:   Complete by: As directed    Per wound care instructions   Increase activity slowly   Complete by: As directed       Allergies as of 03/06/2023       Reactions   Celecoxib Other (See Comments)   Insomnia   Duloxetine Hcl Other (See Comments)   Insomnia   Tizanidine Nausea And Vomiting   Venlafaxine Other (See Comments)   Hallucinations   Effersyllium [psyllium] Other (See Comments)   Made the patient "feel badly"   Escitalopram Other (See Comments)   insomnia   Neurontin [gabapentin] Other (See Comments)   Made the patient "feel badly"   Tramadol Other (See Comments)   Made the patient "feel badly"        Medication List     STOP taking these medications    morphine 30 MG 12 hr tablet Commonly known as: MS CONTIN Replaced by: morphine 15 MG tablet       TAKE these medications    apixaban 5 MG Tabs tablet Commonly known as: ELIQUIS Take 1 tablet (5 mg total) by mouth 2 (two) times daily.   cefadroxil 500 MG capsule Commonly known as: DURICEF Take 2 capsules (1,000 mg total) by mouth 2 (two) times daily for 7 days.   morphine 15 MG tablet Commonly  known as: MSIR Take 0.5 tablets (7.5 mg total) by mouth every 4 (four) hours as needed for severe pain (pain score 7-10). Replaces: morphine  30 MG 12 hr tablet   pantoprazole 40 MG tablet Commonly known as: PROTONIX Take 1 tablet (40 mg total) by mouth daily. Start taking on: March 07, 2023   pregabalin 300 MG capsule Commonly known as: LYRICA Take 300 mg by mouth in the morning and at bedtime.   propranolol 40 MG tablet Commonly known as: INDERAL Take 1 tablet (40 mg total) by mouth 3 (three) times daily.   spironolactone 100 MG tablet Commonly known as: ALDACTONE Take 1 tablet (100 mg total) by mouth daily. Start taking on: March 07, 2023               Discharge Care Instructions  (From admission, onward)           Start     Ordered   03/06/23 0000  Discharge wound care:       Comments: Per wound care instructions   03/06/23 0857            Follow-up Information     Fenton, Clint R, PA Follow up.   Specialty: Cardiology Why: April 03, 2022 at 1130. Contact information: [address] [place] [address] [phone]                Allergies  Allergen Reactions   Celecoxib Other (See Comments)    Insomnia   Duloxetine Hcl Other (See Comments)    Insomnia   Tizanidine Nausea And Vomiting   Venlafaxine Other (See Comments)    Hallucinations   Effersyllium [Psyllium] Other (See Comments)    Made the patient "feel badly"   Escitalopram Other (See Comments)    insomnia   Neurontin [Gabapentin] Other (See Comments)    Made the patient "feel badly"   Tramadol Other (See Comments)    Made the patient "feel badly"    Consultations: Pulmonary and critical care   Procedures/Studies: DG Chest Port 1 View  Result Date: 03/03/2023 CLINICAL DATA:  Shortness of breath. EXAM: PORTABLE CHEST 1 VIEW COMPARISON:  Chest radiograph dated 02/22/2013 FINDINGS: Moderate bilateral pleural effusions, new on the right since the prior radiograph. There is associated partial compressive atelectasis of the lower lobes versus pneumonia. Loculated fluid in the left fissure again noted but less  conspicuous compared to prior radiograph. No pneumothorax. Stable cardiac silhouette. No acute osseous pathology. IMPRESSION: Moderate bilateral pleural effusions with associated partial compressive atelectasis versus pneumonia. Electronically Signed   By: Elgie Collard M.D.   On: 03/03/2023 15:38   DG CHEST PORT 1 VIEW  Result Date: 02/23/2023 CLINICAL DATA:  Chest tube removal. EXAM: PORTABLE CHEST 1 VIEW COMPARISON:  02/21/2023 FINDINGS: Left pleural drain has been pulled in the interval. There is persistent left pleural fluid with some probable loculation. Similar retrocardiac left base collapse/consolidation. No evidence for pneumothorax. Right lung clear. 2 catheters are superimposed on the mediastinum NG junction, presumably reflecting superimposition of the internal and external components of the patient's feeding tube. IMPRESSION: Interval removal of left pleural drain with persistent left pleural fluid and retrocardiac left base collapse/consolidation. No evidence for pneumothorax. Electronically Signed   By: Kennith Center M.D.   On: 02/23/2023 12:24   US Paracentesis  Result Date: 02/22/2023 INDICATION: Patient with history of hepatitis-C, cirrhosis, left chest empyema, pulmonary emboli, ascites. Request received for diagnostic and therapeutic paracentesis. EXAM: ULTRASOUND GUIDED DIAGNOSTIC AND THERAPEUTIC PARACENTESIS  MEDICATIONS: 8 mL 1% lidocaine COMPLICATIONS: None immediate. PROCEDURE: Informed written consent was obtained from the patient after a discussion of the risks, benefits and alternatives to treatment. A timeout was performed prior to the initiation of the procedure. Initial ultrasound scanning demonstrates a small to moderate amount of ascites within the left lower abdominal quadrant. The left lower abdomen was prepped and draped in the usual sterile fashion. 1% lidocaine was used for local anesthesia. Following this, a 19 gauge, 7-cm, Yueh catheter was introduced. An  ultrasound image was saved for documentation purposes. The paracentesis was performed. The catheter was removed and a dressing was applied. The patient tolerated the procedure well without immediate post procedural complication. FINDINGS: A total of approximately 450 cc of light yellow fluid was removed. Samples were sent to the laboratory as requested by the clinical team. IMPRESSION: Successful ultrasound-guided diagnostic and therapeutic paracentesis yielding 450 cc of peritoneal fluid. Due to close proximity of bowel loops and somewhat loculated nature of ascites only the above amount of fluid could be removed today. Performed by: Artemio Aly Electronically Signed   By: Marliss Coots M.D.   On: 02/22/2023 13:35   CT Angio Chest Pulmonary Embolism (PE) W or WO Contrast  Addendum Date: 02/21/2023   ADDENDUM REPORT: 02/21/2023 21:18 ADDENDUM: Critical Value/emergent results were called by telephone at the time of interpretation on 02/21/2023 at 9:17 pm to provider Dr. Pasty Arch, who verbally acknowledged these results. Electronically Signed   By: Thornell Sartorius M.D.   On: 02/21/2023 21:18   Result Date: 02/21/2023 CLINICAL DATA:  Pulmonary embolism suspected, low to intermediate probability, negative D-dimer. Rule out pulmonary embolism. EXAM: CT ANGIOGRAPHY CHEST WITH CONTRAST TECHNIQUE: Multidetector CT imaging of the chest was performed using the standard protocol during bolus administration of intravenous contrast. Multiplanar CT image reconstructions and MIPs were obtained to evaluate the vascular anatomy. RADIATION DOSE REDUCTION: This exam was performed according to the departmental dose-optimization program which includes automated exposure control, adjustment of the mA and/or kV according to patient size and/or use of iterative reconstruction technique. CONTRAST:  75mL OMNIPAQUE IOHEXOL 350 MG/ML SOLN COMPARISON:  02/20/2021. FINDINGS: Cardiovascular: The heart is normal in size and there is no  pericardial effusion. There is atherosclerotic calcification of the aorta without evidence of aneurysm. The pulmonary trunk is normal in caliber. Segmental and subsegmental pulmonary artery filling defects are present in the right lower lobe. There is no evidence of right heart strain. Mediastinum/Nodes: No mediastinal, hilar, or axillary lymphadenopathy. The thyroid gland and trachea are within normal limits. An enteric tube is seen in the esophagus. Lungs/Pleura: Biapical pleural thickening is present bilaterally. There is a loculated pleural effusion on the left with a associated pleural thickening. A left-sided chest tube is in place. Consolidation is noted in the left lower lobe. There is a small right pleural effusion. With patchy airspace disease at the right lung base. No pneumothorax is seen. Upper Abdomen: The liver has a nodular contour, compatible with underlying cirrhosis. Stable linear hypodensity is noted in the spleen, possible infarcts or laceration and unchanged from the prior exam. Moderate ascites is noted in the upper abdomen. Musculoskeletal: Cervical spinal fusion hardware is present. No acute osseous abnormality is seen. Review of the MIP images confirms the above findings. IMPRESSION: 1. Right lower lobe segmental and subsegmental pulmonary emboli. No evidence of right heart strain. 2. Stable loculated pleural effusion on the left with pleural thickening and chest tube in place and left lower lobe atelectasis or infiltrate.  3. Small right pleural effusion with patchy airspace disease at the right lung base, unchanged. 4. Cirrhosis. 5. Linear hypodensity in the spleen, possible infarct or laceration, unchanged. 6. Moderate ascites in the upper abdomen. Electronically Signed: By: Thornell Sartorius M.D. On: 02/21/2023 20:28   DG CHEST PORT 1 VIEW  Result Date: 02/21/2023 CLINICAL DATA:  Chest tube EXAM: PORTABLE CHEST 1 VIEW COMPARISON:  02/19/2023 FINDINGS: Right lung clear. The more inferior  of the 2 left-sided pigtail drainage catheters has been removed in the interval. The more cranial of the 2 persists and appear stable. Pleural gas in the left hemithorax noted on the previous study is no longer evident with persistent left pleural fluid collection. A feeding tube passes into the stomach although the distal tip position is not included on the film. IMPRESSION: 1. Interval removal of the more inferior of the 2 left-sided pigtail drainage catheters. 2. Persistent left pleural fluid collection with interval resolution of the left-sided pleural gas seen previously. Electronically Signed   By: Kennith Center M.D.   On: 02/21/2023 16:07   CT ABDOMEN PELVIS W CONTRAST  Result Date: 02/21/2023 CLINICAL DATA:  Abdominal pain, cirrhosis and possible splenic infarcts by prior CT. EXAM: CT ABDOMEN AND PELVIS WITH CONTRAST TECHNIQUE: Multidetector CT imaging of the abdomen and pelvis was performed using the standard protocol following bolus administration of intravenous contrast. RADIATION DOSE REDUCTION: This exam was performed according to the departmental dose-optimization program which includes automated exposure control, adjustment of the mA and/or kV according to patient size and/or use of iterative reconstruction technique. CONTRAST:  OMNIPAQUE IOHEXOL 300 MG/ML  SOLN COMPARISON:  Prior CT of the abdomen and pelvis on 02/13/2023 FINDINGS: Lower chest: Chest findings were described on a CT of the chest earlier today. Additional finding after administration of contrast potential small distal subsegmental pulmonary embolism in a posterior basilar right lower lobe pulmonary artery branch. This is not very well delineated on the abdominal CT. Hepatobiliary: Cirrhosis of the liver. No solid mass. The gallbladder is unremarkable. No biliary ductal dilatation. Pancreas: Unremarkable. No pancreatic ductal dilatation or surrounding inflammatory changes. Spleen: Anterior small perfusion abnormality of the  spleen again noted which may represent involving infarct. The perfusion defect involving the superior and mid spleen extends from the anterior to posterior capsular surface and although this may also represent an infarct is somewhat atypical in shape for an infarct and could represent a laceration. This also appears stable since the prior contrast enhanced CT and is not associated with significant perisplenic hemorrhage or any contrast extravasation. Adrenals/Urinary Tract: Adrenal glands are unremarkable. Kidneys are normal, without renal calculi, focal lesion, or hydronephrosis. Bladder is decompressed by a Foley catheter. Stomach/Bowel: No bowel obstruction, significant ileus or free intraperitoneal air. A feeding tube extends into the second portion of the duodenum. Rectal tube present. Vascular/Lymphatic: Mild atherosclerosis of the abdominal aorta without aneurysm. Gastro renal shunt present as well as distal esophageal varices. No portal vein thrombus. The splenic vein is patent. No lymphadenopathy identified. Reproductive: Uterus and bilateral adnexa are unremarkable. Other: Mild to moderate volume ascites throughout the peritoneal cavity. Body wall anasarca. Small right inguinal hernia containing fat. Musculoskeletal: Status post prior posterior lumbar fusion at the L4-5 level. IMPRESSION: 1. 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 2. Mild to moderate volume ascites throughout the peritoneal cavity. 3. Body wall anasarca. 4. Anterior small perfusion abnormality of the spleen again noted which may represent  involving infarct. The perfusion defect involving the superior and mid spleen extends from the anterior to posterior capsular surface and although this may also represent an infarct is somewhat atypical in shape for an infarct and could represent a laceration. This also appears stable since the prior contrast enhanced CT and  is not associated with significant perisplenic hemorrhage or any contrast extravasation. 5. Chest findings were described on a CT of the chest earlier today. Additional finding after administration of contrast at the visualized lung bases of a potential distal subsegmental pulmonary embolism in a posterior basilar right lower lobe pulmonary artery branch. This is not very well delineated on the abdominal CT. Consider follow-up CTA of the chest with contrast. 6. Aortic atherosclerosis. Electronically Signed   By: Irish Lack M.D.   On: 02/21/2023 12:06   CT CHEST WO CONTRAST  Result Date: 02/21/2023 CLINICAL DATA:  Status post staged placement of 2 separate percutaneous left-sided thoracostomy tube is to treat empyema. EXAM: CT CHEST WITHOUT CONTRAST TECHNIQUE: Multidetector CT imaging of the chest was performed following the standard protocol without IV contrast. RADIATION DOSE REDUCTION: This exam was performed according to the departmental dose-optimization program which includes automated exposure control, adjustment of the mA and/or kV according to patient size and/or use of iterative reconstruction technique. COMPARISON:  02/17/2023 FINDINGS: Cardiovascular: The heart size is normal. No pericardial fluid. Normal caliber thoracic aorta. Normal caliber central pulmonary arteries. No significant calcified coronary artery plaque. Mediastinum/Nodes: No enlarged mediastinal or axillary lymph nodes. Thyroid gland, trachea, and esophagus demonstrate no significant findings. Feeding tube present in the esophagus and extends into the stomach. Lungs/Pleura: Substantial improvement in appearance left empyema after additional catheter drainage on 02/17/2023. The lateral component of fluid is now evacuated completely. The only residual pleural fluid collections are 2 separate small loculations within the major fissure with superior loculation measuring roughly 2.8 x 3.9 cm on axial images and more inferior  loculation measuring roughly 2.4 x 3.1 cm. Minimal residual basilar pleural fluid with associated residual left lower lobe atelectasis/consolidation. No pneumothorax. Airspace disease in the posterior and inferior right lower lobe appears stable since the prior CT. Upper Abdomen: Stable appearance of cirrhosis and upper abdominal ascites. Musculoskeletal: No chest wall mass or suspicious bone lesions identified. IMPRESSION: 1. Substantial improvement in appearance of left empyema after additional catheter drainage on 02/17/2023. The lateral component of fluid is now evacuated completely. The only residual pleural fluid collections are 2 separate small loculations within the major fissure with superior loculation measuring roughly 2.8 x 3.9 cm on axial images and more inferior loculation measuring roughly 2.4 x 3.1 cm. 2. Minimal residual basilar pleural fluid with associated residual left lower lobe atelectasis/consolidation. 3. Stable right lower lobe airspace disease. 4. Stable appearance of cirrhosis and upper abdominal ascites. Electronically Signed   By: Irish Lack M.D.   On: 02/21/2023 11:50   DG CHEST PORT 1 VIEW  Result Date: 02/19/2023 CLINICAL DATA:  Pleural effusion. EXAM: PORTABLE CHEST 1 VIEW COMPARISON:  X-ray 02/18/2023 and older. FINDINGS: Enteric tube in place. Persistent left-sided 2 pigtail pleural catheters with the improving pneumothorax. Trace residual laterally. There is also small residual left pleural effusion but improving. Persistent left retrocardiac opacity. Hyperinflation. No right-sided pneumothorax. No edema. Stable cardiopericardial silhouette. Overlapping cardiac leads. IMPRESSION: Interval improvement of the left-sided hydropneumothorax with some residual. Electronically Signed   By: Karen Kays M.D.   On: 02/19/2023 12:07   DG Abd 1 View  Result Date: 02/18/2023 CLINICAL DATA:  Feeding tube placement. EXAM: ABDOMEN - 1 VIEW COMPARISON:  None Available. FINDINGS:  Tip of the weighted enteric tube in the right upper quadrant in the region of the distal stomach or proximal duodenum. Drainage catheter in the left upper quadrant/lung base. Small amount of air in the adjacent soft tissues. IMPRESSION: Tip of the weighted enteric tube in the right upper quadrant in the region of the distal stomach or proximal duodenum. Electronically Signed   By: Narda Rutherford M.D.   On: 02/18/2023 18:07   DG Chest Port 1 View  Result Date: 02/18/2023 CLINICAL DATA:  Chest tube. EXAM: PORTABLE CHEST 1 VIEW COMPARISON:  February 17, 2023. FINDINGS: There is been interval placement of another left-sided chest tube more superiorly. Pleural effusion is significantly smaller, although some degree of pneumothorax is now seen. IMPRESSION: Interval placement of another left-sided chest tube with decreased left pleural effusion, but small pneumothorax now seen. Electronically Signed   By: Lupita Raider M.D.   On: 02/18/2023 07:39   CT CHEST WO CONTRAST  Result Date: 02/17/2023 CLINICAL DATA:  LEFT chest empyema EXAM: CT CHEST WITHOUT CONTRAST TECHNIQUE: Multidetector CT imaging of the chest was performed following the standard protocol without IV contrast. RADIATION DOSE REDUCTION: This exam was performed according to the departmental dose-optimization program which includes automated exposure control, adjustment of the mA and/or kV according to patient size and/or use of iterative reconstruction technique. COMPARISON:  Chest XR, earlier same day. CT chest, 02/13/2023. US Thyroid, 07/11/2020. FINDINGS: Cardiovascular: No significant vascular findings. Normal heart size. No pericardial effusion. Mediastinum/Nodes: No enlarged mediastinal or axillary lymph nodes. 1.5 cm LEFT inferior thyroid nodule. Trachea, and esophagus demonstrate no significant findings. Lungs/Pleura: Nodular and tree-in-bud opacities at the RIGHT basilar lung. The RIGHT lung is otherwise clear. Moderate-to-large volume  loculated-appearing, LEFT lateral pleural effusion, measuring 11.0 x 4.0 x 20.0 cm. Additional intrapulmonary component, measuring approximately 3.0 x 3.0 x 11.0 cm. Atelectatic LEFT lung.  No pneumothorax. Pigtail catheter positioned at the LEFT basilar chest, and suspected subdiaphragmatic in location. See key image. Upper Abdomen: Nodular contour liver with small volume of perihepatic ascites. No acute abnormality. Musculoskeletal: No acute chest wall mass. No acute osseous abnormality. IMPRESSION: 1. Moderate-to-large volume loculated LEFT pleural effusion, measuring up to 20 cm in greatest dimension. 2. LEFT basilar pigtail drainage catheter appears sub-diaphragmatic in location. Consider removal. 3. RIGHT basilar nodular pulmonary opacities, consistent with multifocal pneumonia. 4. Cirrhotic morphology liver with small volume perihepatic ascites 5. Incidental 1.5 cm LEFT inferior thyroid nodule measuring 1.5 cm. Follow-up with a non-emergent, outpatient thyroid ultrasound. Reference: J Am Coll Radiol. 2015 Feb;12(2): 143-50 These results were called by telephone at the time of interpretation on 02/17/2023 to provider Janeann Forehand ICU APP, who verbally acknowledged these results. Roanna Banning, MD [facility] Electronically Signed   By: Roanna Banning M.D.   On: 02/17/2023 17:46   CT [facility] PLEURAL DRAIN W/INDWELL CATH W/IMG GUIDE  Result Date: 02/17/2023 INDICATION: 142230 Pleural effusion 142230 EXAM: CT-GUIDED LEFT NON-TUNNELED PLEURAL DRAINAGE CATHETER PLACEMENT COMPARISON:  CT chest and chest XR, earlier same day. MEDICATIONS: The patient is currently admitted to the hospital and receiving intravenous antibiotics. The antibiotics were administered within an appropriate time frame prior to the initiation of the procedure. ANESTHESIA/SEDATION: Moderate (conscious) sedation was employed during this procedure. A total of Versed 1 mg and Fentanyl 75  mcg was administered intravenously. Moderate Sedation Time: 23 minutes. The patient's level of consciousness and vital  signs were monitored continuously by radiology nursing throughout the procedure under my direct supervision. CONTRAST:  None COMPLICATIONS: None immediate. PROCEDURE: RADIATION DOSE REDUCTION: This exam was performed according to the departmental dose-optimization program which includes automated exposure control, adjustment of the mA and/or kV according to patient size and/or use of iterative reconstruction technique. Informed written consent was obtained from the patient and/or patient's representative after a discussion of the risks, benefits and alternatives to treatment. The patient was placed supine on the CT gantry and a pre procedural CT was performed re-demonstrating the known abscess/fluid collection within the LEFT chest. The procedure was planned. A timeout was performed prior to the initiation of the procedure. The LEFT lateral chest was prepped and draped in the usual sterile fashion. The overlying soft tissues were anesthetized with 1% lidocaine with epinephrine. Appropriate trajectory was planned with the use of a 22 gauge spinal needle. An 18 gauge trocar needle was advanced into the abscess/fluid collection and a short Amplatz super stiff wire was coiled within the collection. Appropriate positioning was confirmed with a limited CT scan. The tract was serially dilated allowing placement of a 14 Fr drainage catheter. Appropriate positioning was confirmed with a limited postprocedural CT scan. 25 mL of purulent fluid was aspirated. The tube was connected to a pleura vac and sutured in place. A dressing was placed. The patient tolerated the procedure well without immediate post procedural complication. IMPRESSION: Successful CT guided placement of a 14 Fr non tunneled pleural drain catheter into the LEFT chest with aspiration of 25 mL of purulent fluid. Samples were sent to the  laboratory as requested by the ordering clinical team. PLAN: - Fibrinolysis per Pulmonary team. Roanna Banning, MD [facility] Radiology Electronically Signed   By: Roanna Banning M.D.   On: 02/17/2023 16:53   DG Chest 1 View  Result Date: 02/17/2023 CLINICAL DATA:  Chest tube. EXAM: CHEST  1 VIEW COMPARISON:  February 16, 2023. FINDINGS: Stable position of left-sided chest tube. Stable probably loculated left pleural effusion. IMPRESSION: Stable left-sided chest tube and probably loculated left pleural effusion. Electronically Signed   By: Lupita Raider M.D.   On: 02/17/2023 11:30   DG Chest 1 View  Result Date: 02/16/2023 CLINICAL DATA:  Pleural effusion EXAM: CHEST  1 VIEW COMPARISON:  02/15/2023. FINDINGS: Large left-sided pleural effusion much of which is loculated left basilar chest tube is stable finding right lung clear. No pneumothorax. Aorta is calcified. IMPRESSION: Persistent large left-sided effusion. Electronically Signed   By: Layla Maw M.D.   On: 02/16/2023 08:14   DG Chest Port 1 View  Result Date: 02/15/2023 CLINICAL DATA:  Shortness of breath. EXAM: PORTABLE CHEST 1 VIEW COMPARISON:  Earlier chest radiograph dated 02/15/2023. FINDINGS: Left-sided chest tube in similar position. No significant interval change in the size of left pleural effusion since the earlier radiograph. The right lung is clear. No pneumothorax. Stable cardiac silhouette. No acute osseous pathology. IMPRESSION: No significant interval change since the earlier radiograph. Electronically Signed   By: Elgie Collard M.D.   On: 02/15/2023 17:58   DG Chest 1 View  Result Date: 02/15/2023 CLINICAL DATA:  67 year old female with history of chest tube. EXAM: CHEST  1 VIEW COMPARISON:  Chest x-ray 02/14/2023. FINDINGS: Left-sided chest tube again noted with pigtail reformed projecting over the lower left hemithorax. Persistent large loculated pleural effusion,  similar to the prior study. Atelectasis and/or consolidation in the base of the left lung. Right lung  is clear. No right pleural effusion. No evidence of pulmonary edema. Heart size is normal. Upper mediastinal contours are within normal limits. IMPRESSION: 1. Stable position of left chest tube with large loculated left pleural effusion, similar to the prior study. Atelectasis and/or consolidation in the left lung base. Electronically Signed   By: Trudie Reed M.D.   On: 02/15/2023 08:30   ECHOCARDIOGRAM COMPLETE  Result Date: 02/14/2023    ECHOCARDIOGRAM REPORT   Patient Name:   Anita Price Date of Exam: 02/14/2023 Medical Rec #:  [id number]      Height:       65.0 in Accession #:    [id number]     Weight:       103.4 lb Date of Birth:  11-Dec-1955       BSA:          1.494 m Patient Age:    67 years       BP:           115/48 mmHg Patient Gender: F              HR:           87 bpm. Exam Location:  Inpatient Procedure: 2D Echo, Color Doppler and Cardiac Doppler Indications:    Abnormal ECG  History:        Patient has no prior history of Echocardiogram examinations.                 Sepsis, Empyema, Hepatitis C; Risk Factors:Hypertension and                 Current Smoker.  Sonographer:    Milbert Coulter Referring Phys: [id number] DAVID MANUEL ORTIZ IMPRESSIONS  1. Left ventricular ejection fraction, by estimation, is 60 to 65%. The left ventricle has normal function. The left ventricle has no regional wall motion abnormalities. Left ventricular diastolic parameters were normal.  2. Right ventricular systolic function is normal. The right ventricular size is normal.  3. The mitral valve is normal in structure. No evidence of mitral valve regurgitation. No evidence of mitral stenosis.  4. The aortic valve is normal in structure. Aortic valve regurgitation is not visualized. No aortic stenosis is present.  5. The inferior vena cava is normal in size with greater than 50% respiratory variability, suggesting right  atrial pressure of 3 mmHg. FINDINGS  Left Ventricle: Left ventricular ejection fraction, by estimation, is 60 to 65%. The left ventricle has normal function. The left ventricle has no regional wall motion abnormalities. The left ventricular internal cavity size was normal in size. There is  no left ventricular hypertrophy. Left ventricular diastolic parameters were normal. Normal left ventricular filling pressure. Right Ventricle: The right ventricular size is normal. No increase in right ventricular wall thickness. Right ventricular systolic function is normal. Left Atrium: Left atrial size was normal in size. Right Atrium: Right atrial size was normal in size. Pericardium: There is no evidence of pericardial effusion. Mitral Valve: The mitral valve is normal in structure. No evidence of mitral valve regurgitation. No evidence of mitral valve stenosis. Tricuspid Valve: The tricuspid valve is normal in structure. Tricuspid valve regurgitation is trivial. No evidence of tricuspid stenosis. Aortic Valve: The aortic valve is normal in structure. Aortic valve regurgitation is not visualized. No aortic stenosis is present. Aortic valve mean gradient measures 3.0 mmHg. Aortic valve peak gradient measures 6.0 mmHg. Aortic valve area, by VTI measures 2.29 cm. Pulmonic Valve: The pulmonic valve was normal in  structure. Pulmonic valve regurgitation is not visualized. No evidence of pulmonic stenosis. Aorta: The aortic root is normal in size and structure. Venous: The inferior vena cava is normal in size with greater than 50% respiratory variability, suggesting right atrial pressure of 3 mmHg. IAS/Shunts: The interatrial septum appears to be lipomatous. No atrial level shunt detected by color flow Doppler.  LEFT VENTRICLE PLAX 2D LVIDd:         3.70 cm   Diastology LVIDs:         2.90 cm   LV e' medial:    9.36 cm/s LV PW:         0.90 cm   LV E/e' medial:  6.8 LV IVS:        0.90 cm   LV e' lateral:   10.80 cm/s LVOT diam:      1.80 cm   LV E/e' lateral: 5.9 LV SV:         41 LV SV Index:   27 LVOT Area:     2.54 cm  RIGHT VENTRICLE RV Basal diam:  2.70 cm RV Mid diam:    2.10 cm RV S prime:     15.20 cm/s TAPSE (M-mode): 2.2 cm LEFT ATRIUM             Index        RIGHT ATRIUM          Index LA diam:        2.50 cm 1.67 cm/m   RA Area:     8.24 cm LA Vol (A2C):   33.8 ml 22.62 ml/m  RA Volume:   13.60 ml 9.10 ml/m LA Vol (A4C):   14.1 ml 9.43 ml/m LA Biplane Vol: 21.9 ml 14.65 ml/m  AORTIC VALVE AV Area (Vmax):    2.13 cm AV Area (Vmean):   1.93 cm AV Area (VTI):     2.29 cm AV Vmax:           122.00 cm/s AV Vmean:          84.200 cm/s AV VTI:            0.178 m AV Peak Grad:      6.0 mmHg AV Mean Grad:      3.0 mmHg LVOT Vmax:         102.00 cm/s LVOT Vmean:        63.800 cm/s LVOT VTI:          0.160 m LVOT/AV VTI ratio: 0.90  AORTA Ao Root diam: 3.20 cm MITRAL VALVE               TRICUSPID VALVE MV Area (PHT): 3.27 cm    TR Peak grad:   24.2 mmHg MV Decel Time: 232 msec    TR Vmax:        246.00 cm/s MV E velocity: 63.40 cm/s MV A velocity: 64.70 cm/s  SHUNTS MV E/A ratio:  0.98        Systemic VTI:  0.16 m                            Systemic Diam: 1.80 cm Armanda Magic MD Electronically signed by Armanda Magic MD Signature Date/Time: 02/14/2023/4:01:03 PM    Final    DG Chest 1 View  Result Date: 02/14/2023 CLINICAL DATA:  Chest tube placement EXAM: CHEST  1 VIEW COMPARISON:  Yesterday FINDINGS: Cervical spine fixation. Midline trachea. Normal heart size.  Left pigtail pleural catheter has been placed. The left-sided pleural effusion with loculation is increased, moderate. No pneumothorax. Clear right lung. Left lower lung airspace disease is increased. Asymmetric left-sided interstitial edema. IMPRESSION: Left pigtail pleural catheter in place with increase in loculated moderate left pleural effusion. Interstitial edema persists. Left mid and lower lung airspace disease is increased. Electronically Signed   By: Jeronimo Greaves M.D.   On: 02/14/2023 15:39   DG Chest 1 View  Result Date: 02/13/2023 CLINICAL DATA:  Weakness, nausea, vomiting, status post thoracentesis EXAM: CHEST  1 VIEW COMPARISON:  Radiograph and CT earlier today FINDINGS: Decreased left pleural effusion after thoracentesis compared with radiographs earlier today. Small residual portable effusion. Improved aeration of the left lung with residual ground-glass opacities and atelectasis. The right lung is clear. Stable cardiomediastinal silhouette. No pneumothorax. IMPRESSION: Decreased left pleural effusion after thoracentesis. No pneumothorax. Electronically Signed   By: Minerva Fester M.D.   On: 02/13/2023 20:25   US THORACENTESIS ASP PLEURAL SPACE W/IMG GUIDE  Result Date: 02/13/2023 INDICATION: Patient with history of cirrhosis, hepatitis-C, encephalopathy, weakness, loculated left pleural effusion; request received for diagnostic and therapeutic left thoracentesis. EXAM: ULTRASOUND GUIDED DIAGNOSTIC AND THERAPEUTIC LEFT THORACENTESIS MEDICATIONS: 8 mL 1% lidocaine COMPLICATIONS: None immediate. PROCEDURE: An ultrasound guided thoracentesis was thoroughly discussed with the patient/daughter and questions answered. The benefits, risks, alternatives and complications were also discussed. The patient/daughter understands and wishes to proceed with the procedure. Written consent was obtained. Ultrasound was performed to localize and mark an adequate pocket of fluid in the left chest. The area was then prepped and draped in the normal sterile fashion. 1% Lidocaine was used for local anesthesia. Under ultrasound guidance a 6 Fr Safe-T-Centesis catheter was introduced. Thoracentesis was performed. The catheter was removed and a dressing applied. FINDINGS: A total of approximately 1 liter of purulent, cream colored/milky fluid was removed. Samples were sent to the laboratory as requested by the clinical team. IMPRESSION: Successful ultrasound guided diagnostic  and therapeutic left thoracentesis yielding 1 liter of pleural fluid. Performed by: Artemio Aly Electronically Signed   By: Marliss Coots M.D.   On: 02/13/2023 16:38   CT CHEST ABDOMEN PELVIS W CONTRAST  Result Date: 02/13/2023 CLINICAL DATA:  Sepsis weakness, nausea and vomiting for 3-4 days. EXAM: CT CHEST, ABDOMEN, AND PELVIS WITH CONTRAST TECHNIQUE: Multidetector CT imaging of the chest, abdomen and pelvis was performed following the standard protocol during bolus administration of intravenous contrast. RADIATION DOSE REDUCTION: This exam was performed according to the departmental dose-optimization program which includes automated exposure control, adjustment of the mA and/or kV according to patient size and/or use of iterative reconstruction technique. CONTRAST:  60mL OMNIPAQUE IOHEXOL 300 MG/ML  SOLN COMPARISON:  CT scan abdomen and pelvis from 07/01/2021 and CT scan chest from 07/10/2020 FINDINGS: CT CHEST FINDINGS Cardiovascular: Normal cardiac size. No pericardial effusion. No aortic aneurysm. Mediastinum/Nodes: Redemonstration of an exophytic heterogeneous centrally hypoattenuating and peripherally hyperattenuating walled nodule arising from the inferior aspect of the left thyroid lobe measuring 1.7 x 1.8 cm. This is incompletely characterized on the current examination but appears unchanged since the prior study from 2022. Please refer to ultrasound thyroid report from April 2022 for additional details. No solid / cystic mediastinal masses. The esophagus is nondistended precluding optimal assessment. No axillary, mediastinal or hilar lymphadenopathy by size criteria. Lungs/Pleura: The trachea and right bronchial tree is patent. There is complete opacification of left bronchial tree with soft tissue attenuation areas and multiple small foci of  air. There is large left pleural effusion with sharp margins, favoring loculated pleural effusion. There is mild smooth thickening of the costal pleura.  There also 2 small patches of loculated pleural effusion along the medial aspect. Findings favor chronic loculated pleural effusion than empyema. There is associated complete collapse of the left lower lobe and lingular segments of left upper lobe. There is partial aeration of the remaining left upper lobe. No suspicious mass or abscess seen within the collapsed lung. There are dependent changes in the right lung. Minimal centrilobular emphysematous changes also noted in the right lung. Right lung is otherwise clear. No mass, consolidation or pleural effusion. Musculoskeletal: The visualized soft tissues of the chest wall are grossly unremarkable. No suspicious osseous lesions. There are mild multilevel degenerative changes in the visualized spine. CT ABDOMEN PELVIS FINDINGS Hepatobiliary: The liver is normal in size. There is liver surface irregularity/nodularity, compatible with cirrhosis. No discrete suspicious lesion seen on this single phase exam. There are at least 2, subcentimeter, hypoattenuating foci in the right hepatic lobe, which are too small to adequately characterize. No intrahepatic or extrahepatic bile duct dilation. No calcified gallstones. Normal gallbladder wall thickness. No pericholecystic inflammatory changes. Pancreas: Unremarkable. No pancreatic ductal dilatation or surrounding inflammatory changes. Spleen: There are at least 3, predominantly peripheral wedge-shaped hypoattenuating areas within the spleen, compatible with splenic infarctions. There is an additional subcentimeter sized hyperattenuating focus along the inferior tip, which is incompletely characterized on the current exam but favored to represent a hemangioma. The spleen is mildly enlarged measuring up to 12.9 cm in length, decreased in size since the prior study from 2022, when it measured up to 14.2 cm. Adrenals/Urinary Tract: Adrenal glands are unremarkable. No suspicious renal mass. No hydronephrosis. No renal or ureteric  calculi. Unremarkable urinary bladder. Stomach/Bowel: No disproportionate dilation of the small or large bowel loops. No evidence of abnormal bowel wall thickening or inflammatory changes. The appendix is unremarkable. Vascular/Lymphatic: No ascites or pneumoperitoneum. No abdominal or pelvic lymphadenopathy, by size criteria. No aneurysmal dilation of the major abdominal arteries. There are mild peripheral atherosclerotic vascular calcifications of the aorta and its major branches. There are multiple venous collaterals near the fundus/cardia of the stomach, compatible with sequela of portal hypertension. Reproductive: There is small anteverted uterus. However, there is new endometrial thickening measuring up to 10-11 mm. This is abnormal for the patient of this age group. Clinical correlation and further evaluation with nonemergent pelvic ultrasound is recommended. No large adnexal mass seen. Bilateral fallopian tube closure device noted. Other: There are bilateral small fat containing inguinal hernias. The soft tissues and abdominal wall are otherwise unremarkable. Musculoskeletal: No suspicious osseous lesions. There are mild - moderate multilevel degenerative changes in the visualized spine. Posterior spinal fixation of L4-5 noted with transpedicular screws and rods. IMPRESSION: 1. There is large loculated left pleural effusion with associated complete collapse of the left lung lower lobe and lingular segment. 2. 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 3. Normal-size anteverted uterus however, there is new thickening of the endometrium measuring up to 10-11 mm. Clinical correlation and further evaluation with nonemergent pelvic ultrasound is recommended. 4. Cirrhotic liver configuration with mild splenomegaly and multiple venous collaterals near the fundus/cardia of the stomach. No ascites. 5. Multiple other nonacute observations, as described above. Electronically Signed   By: Jules Schick M.D.   On: 02/13/2023  13:18   CT Head Wo Contrast  Result Date: 02/13/2023 CLINICAL DATA:  Mental status change.  Weakness. EXAM: CT HEAD WITHOUT CONTRAST  TECHNIQUE: Contiguous axial images were obtained from the base of the skull through the vertex without intravenous contrast. RADIATION DOSE REDUCTION: This exam was performed according to the departmental dose-optimization program which includes automated exposure control, adjustment of the mA and/or kV according to patient size and/or use of iterative reconstruction technique. COMPARISON:  CT scan head from 06/30/2021. FINDINGS: Brain: No evidence of acute infarction, hemorrhage, hydrocephalus, extra-axial collection or mass lesion/mass effect. Ventricles are normal. Cerebral volume is age appropriate. Vascular: No hyperdense vessel or unexpected calcification. Skull: Normal. Negative for fracture or focal lesion. Sinuses/Orbits: No acute finding. Other: Visualized mastoid air cells are unremarkable. No mastoid effusion. IMPRESSION: *No acute intracranial abnormality. Electronically Signed   By: Jules Schick M.D.   On: 02/13/2023 12:57   DG Chest Port 1 View  Result Date: 02/13/2023 CLINICAL DATA:  Altered mental status. EXAM: PORTABLE CHEST 1 VIEW COMPARISON:  07/01/2021. FINDINGS: There is near complete opacification of left hemithorax without significant mediastinal shift. There is aeration of portion of the left upper mid lung zones. Findings may represent combination of left lung atelectasis and/or consolidation with probable loculated large pleural effusion. Right lung fields and right lateral costophrenic angle are clear. No pneumothorax on either side. Evaluation of cardiomediastinal silhouette is nondiagnostic due to left hemithorax opacification. No acute osseous abnormalities. The soft tissues are within normal limits. IMPRESSION: *Near complete opacification of left hemithorax, as discussed above. Electronically Signed   By: Jules Schick M.D.   On: 02/13/2023  12:54   (Echo, Carotid, EGD, Colonoscopy, ERCP)    Subjective: No complaints  Discharge Exam: Vitals:   03/06/23 0434 03/06/23 0818  BP: 129/72 (!) 140/77  Pulse: (!) 58 63  Resp: 18 15  Temp: 97.6 F (36.4 C) (!) 97.5 F (36.4 C)  SpO2: 96% 97%   Vitals:   03/05/23 2145 03/06/23 0434 03/06/23 0500 03/06/23 0818  BP:  129/72  (!) 140/77  Pulse:  (!) 58  63  Resp: 20 18  15   Temp:  97.6 F (36.4 C)  (!) 97.5 F (36.4 C)  TempSrc:    Oral  SpO2:  96%  97%  Weight:   43 kg   Height:        General: Pt is alert, awake, not in acute distress Cardiovascular: RRR, S1/S2 +, no rubs, no gallops Respiratory: CTA bilaterally, no wheezing, no rhonchi Abdominal: Soft, NT, ND, bowel sounds + Extremities: no edema, no cyanosis    The results of significant diagnostics from this hospitalization (including imaging, microbiology, ancillary and laboratory) are listed below for reference.     Microbiology: No results found for this or any previous visit (from the past 240 hour(s)).   Labs: BNP (last 3 results) No results for input(s): "BNP" in the last 8760 hours. Basic Metabolic Panel: Recent Labs  Lab 03/02/23 1235 03/04/23 0645 03/04/23 1035 03/05/23 0926 03/06/23 0546  NA 129* 135 138 139 140  K 3.6 2.8* 2.9* 3.4* 3.6  CL 101 110 111 111 112*  CO2 21* 16* 20* 18* 19*  GLUCOSE 94 221* 97 119* 125*  BUN 12 11 9 11 12   CREATININE 0.43* <0.30* <0.30* 0.46 0.48  CALCIUM 7.5* 7.5* 7.8* 7.9* 8.1*  MG  --  1.7  --   --   --    Liver Function Tests: Recent Labs  Lab 03/02/23 1235  AST 50*  ALT 25  ALKPHOS 145*  BILITOT 0.8  PROT 5.7*  ALBUMIN 1.8*   No results for  input(s): "LIPASE", "AMYLASE" in the last 168 hours. Recent Labs  Lab 03/04/23 1035  AMMONIA 36*   CBC: Recent Labs  Lab 03/02/23 1235  WBC 8.1  HGB 10.2*  HCT 32.7*  MCV 98.8  PLT 274   Cardiac Enzymes: No results for input(s): "CKTOTAL", "CKMB", "CKMBINDEX", "TROPONINI" in the last 168  hours. BNP: Invalid input(s): "POCBNP" CBG: Recent Labs  Lab 03/05/23 1629 03/05/23 2008 03/06/23 0003 03/06/23 0428 03/06/23 0748  GLUCAP 107* 108* 192* 79 82   D-Dimer No results for input(s): "DDIMER" in the last 72 hours. Hgb A1c No results for input(s): "HGBA1C" in the last 72 hours. Lipid Profile No results for input(s): "CHOL", "HDL", "LDLCALC", "TRIG", "CHOLHDL", "LDLDIRECT" in the last 72 hours. Thyroid function studies Recent Labs    03/03/23 1546  TSH 0.441   Anemia work up No results for input(s): "VITAMINB12", "FOLATE", "FERRITIN", "TIBC", "IRON", "RETICCTPCT" in the last 72 hours. Urinalysis    Component Value Date/Time   COLORURINE YELLOW 07/01/2021 1207   APPEARANCEUR HAZY (A) 07/01/2021 1207   LABSPEC 1.034 (H) 07/01/2021 1207   PHURINE 6.0 07/01/2021 1207   GLUCOSEU NEGATIVE 07/01/2021 1207   HGBUR SMALL (A) 07/01/2021 1207   BILIRUBINUR NEGATIVE 07/01/2021 1207   KETONESUR 20 (A) 07/01/2021 1207   PROTEINUR NEGATIVE 07/01/2021 1207   UROBILINOGEN 0.2 12/17/2010 1447   NITRITE POSITIVE (A) 07/01/2021 1207   LEUKOCYTESUR LARGE (A) 07/01/2021 1207   Sepsis Labs Recent Labs  Lab 03/02/23 1235  WBC 8.1   Microbiology No results found for this or any previous visit (from the past 240 hour(s)).   Time coordinating discharge: Over 30 minutes  SIGNED:   Marinda Elk, MD  [facility] 03/06/2023, 8:57 AM Pager   If 7PM-7AM, please contact night-coverage [email or web address] Password [email or web address]

## 2023-03-15 ENCOUNTER — Emergency Department (HOSPITAL_COMMUNITY): Payer: Medicare Other

## 2023-03-15 ENCOUNTER — Encounter (HOSPITAL_COMMUNITY): Payer: Self-pay

## 2023-03-15 ENCOUNTER — Other Ambulatory Visit: Payer: Self-pay

## 2023-03-15 ENCOUNTER — Inpatient Hospital Stay (HOSPITAL_COMMUNITY)
Admission: EM | Admit: 2023-03-15 | Discharge: 2023-03-18 | DRG: 186 | Disposition: A | Discharge status: Skilled Nursing Facility | Payer: Medicare Other | Source: Skilled Nursing Facility | Attending: Internal Medicine | Admitting: Internal Medicine

## 2023-03-15 DIAGNOSIS — E43 Unspecified severe protein-calorie malnutrition: Secondary | ICD-10-CM | POA: Diagnosis present

## 2023-03-15 DIAGNOSIS — Z886 Allergy status to analgesic agent status: Secondary | ICD-10-CM

## 2023-03-15 DIAGNOSIS — I480 Paroxysmal atrial fibrillation: Secondary | ICD-10-CM | POA: Diagnosis present

## 2023-03-15 DIAGNOSIS — Z681 Body mass index (BMI) 19 or less, adult: Secondary | ICD-10-CM

## 2023-03-15 DIAGNOSIS — F17210 Nicotine dependence, cigarettes, uncomplicated: Secondary | ICD-10-CM | POA: Diagnosis present

## 2023-03-15 DIAGNOSIS — K7460 Unspecified cirrhosis of liver: Secondary | ICD-10-CM | POA: Diagnosis present

## 2023-03-15 DIAGNOSIS — Z7901 Long term (current) use of anticoagulants: Secondary | ICD-10-CM

## 2023-03-15 DIAGNOSIS — J948 Other specified pleural conditions: Secondary | ICD-10-CM | POA: Diagnosis not present

## 2023-03-15 DIAGNOSIS — I10 Essential (primary) hypertension: Secondary | ICD-10-CM | POA: Diagnosis present

## 2023-03-15 DIAGNOSIS — I8510 Secondary esophageal varices without bleeding: Secondary | ICD-10-CM | POA: Diagnosis present

## 2023-03-15 DIAGNOSIS — Z79899 Other long term (current) drug therapy: Secondary | ICD-10-CM

## 2023-03-15 DIAGNOSIS — R188 Other ascites: Secondary | ICD-10-CM | POA: Diagnosis present

## 2023-03-15 DIAGNOSIS — G894 Chronic pain syndrome: Secondary | ICD-10-CM | POA: Diagnosis present

## 2023-03-15 DIAGNOSIS — Z91138 Patient's unintentional underdosing of medication regimen for other reason: Secondary | ICD-10-CM

## 2023-03-15 DIAGNOSIS — Z86711 Personal history of pulmonary embolism: Secondary | ICD-10-CM

## 2023-03-15 DIAGNOSIS — Z888 Allergy status to other drugs, medicaments and biological substances status: Secondary | ICD-10-CM

## 2023-03-15 DIAGNOSIS — B182 Chronic viral hepatitis C: Secondary | ICD-10-CM | POA: Diagnosis present

## 2023-03-15 DIAGNOSIS — T45516A Underdosing of anticoagulants, initial encounter: Secondary | ICD-10-CM | POA: Diagnosis present

## 2023-03-15 DIAGNOSIS — R079 Chest pain, unspecified: Secondary | ICD-10-CM | POA: Diagnosis not present

## 2023-03-15 DIAGNOSIS — J90 Pleural effusion, not elsewhere classified: Principal | ICD-10-CM | POA: Diagnosis present

## 2023-03-15 DIAGNOSIS — F1120 Opioid dependence, uncomplicated: Secondary | ICD-10-CM | POA: Diagnosis present

## 2023-03-15 DIAGNOSIS — D638 Anemia in other chronic diseases classified elsewhere: Secondary | ICD-10-CM | POA: Diagnosis present

## 2023-03-15 DIAGNOSIS — K766 Portal hypertension: Secondary | ICD-10-CM | POA: Diagnosis present

## 2023-03-15 DIAGNOSIS — E872 Acidosis, unspecified: Secondary | ICD-10-CM | POA: Diagnosis present

## 2023-03-15 DIAGNOSIS — Z66 Do not resuscitate: Secondary | ICD-10-CM | POA: Diagnosis present

## 2023-03-15 DIAGNOSIS — Z7189 Other specified counseling: Secondary | ICD-10-CM

## 2023-03-15 DIAGNOSIS — R54 Age-related physical debility: Secondary | ICD-10-CM | POA: Diagnosis present

## 2023-03-15 DIAGNOSIS — Z8619 Personal history of other infectious and parasitic diseases: Secondary | ICD-10-CM

## 2023-03-15 DIAGNOSIS — R071 Chest pain on breathing: Secondary | ICD-10-CM

## 2023-03-15 DIAGNOSIS — Z981 Arthrodesis status: Secondary | ICD-10-CM

## 2023-03-15 LAB — BASIC METABOLIC PANEL
Anion gap: 7 (ref 5–15)
BUN: 11 mg/dL (ref 8–23)
CO2: 21 mmol/L — ABNORMAL LOW (ref 22–32)
Calcium: 8 mg/dL — ABNORMAL LOW (ref 8.9–10.3)
Chloride: 108 mmol/L (ref 98–111)
Creatinine, Ser: 0.58 mg/dL (ref 0.44–1.00)
GFR, Estimated: >60 mL/min (ref >60)
Glucose, Bld: 105 mg/dL — ABNORMAL HIGH (ref 70–99)
Potassium: 3.7 mmol/L (ref 3.5–5.1)
Sodium: 136 mmol/L (ref 135–145)

## 2023-03-15 LAB — CBC
HCT: 36.3 % (ref 36.0–46.0)
Hemoglobin: 11.7 g/dL — ABNORMAL LOW (ref 12.0–15.0)
MCH: 30.5 pg (ref 26.0–34.0)
MCHC: 32.2 g/dL (ref 30.0–36.0)
MCV: 94.5 fL (ref 80.0–100.0)
Platelets: 216 10*3/uL (ref 150–400)
RBC: 3.84 MIL/uL — ABNORMAL LOW (ref 3.87–5.11)
RDW: 17.3 % — ABNORMAL HIGH (ref 11.5–15.5)
WBC: 10.3 10*3/uL (ref 4.0–10.5)
nRBC: 0 % (ref 0.0–0.2)

## 2023-03-15 LAB — TROPONIN I (HIGH SENSITIVITY)
Troponin I (High Sensitivity): 8 ng/L (ref <18)
Troponin I (High Sensitivity): 9 ng/L (ref <18)

## 2023-03-15 MED ORDER — MORPHINE SULFATE (PF) 4 MG/ML IV SOLN
4.0000 mg | Freq: Once | INTRAVENOUS | Status: AC
  Administered 2023-03-16: 4 mg via INTRAVENOUS
  Filled 2023-03-15: qty 1

## 2023-03-15 NOTE — ED Provider Notes (Signed)
[place] EMERGENCY DEPARTMENT AT [facility] Provider Note   CSN: [id number] Arrival date & time: 03/15/23  2012     History {Add pertinent medical, surgical, social history, OB history to HPI:1} Chief Complaint  Patient presents with   Chest Pain    Anita Price is a 67 y.o. female.  The history is provided by the patient.  Chest Pain Anita Price is a 67 y.o. female who presents to the Emergency Department complaining of *** Chest pain pta.  Sharp pain right axillary. Comes and goes. Nonradiating. Has pain on breathing. Has sob. No fever, cough, abdominal pain, N/V.   Not on oxygen.   Currently at [facility]. On eliquis.  Last dose cefadroxil 500 on 12/12 am    Home Medications Prior to Admission medications   Medication Sig Start Date End Date Taking? Authorizing Provider  apixaban (ELIQUIS) 5 MG TABS tablet Take 1 tablet (5 mg total) by mouth 2 (two) times daily. 03/06/23   Marinda Elk, MD  morphine (MSIR) 15 MG tablet Take 0.5 tablets (7.5 mg total) by mouth every 4 (four) hours as needed for severe pain (pain score 7-10). 03/06/23   Marinda Elk, MD  pantoprazole (PROTONIX) 40 MG tablet Take 1 tablet (40 mg total) by mouth daily. 03/07/23   Marinda Elk, MD  pregabalin (LYRICA) 300 MG capsule Take 300 mg by mouth in the morning and at bedtime. 01/03/20   [provider]  propranolol (INDERAL) 40 MG tablet Take 1 tablet (40 mg total) by mouth 3 (three) times daily. 03/06/23   Marinda Elk, MD  spironolactone (ALDACTONE) 100 MG tablet Take 1 tablet (100 mg total) by mouth daily. 03/07/23   Marinda Elk, MD      Allergies    Celecoxib, Duloxetine hcl, Tizanidine, Venlafaxine, Effersyllium [psyllium], Escitalopram, Neurontin [gabapentin], and Tramadol    Review of Systems   Review of Systems  Cardiovascular:  Positive for chest pain.  All other systems reviewed and are negative.   Physical Exam Updated  Vital Signs BP (!) 140/88   Pulse 71   Temp 98.6 F (37 C) (Oral)   Resp (!) 35   Ht 5\' 5"  (1.651 m)   Wt 42.6 kg   SpO2 98%   BMI 15.64 kg/m  Physical Exam Vitals and nursing note reviewed.  Constitutional:      Appearance: She is well-developed.  HENT:     Head: Normocephalic and atraumatic.  Cardiovascular:     Rate and Rhythm: Normal rate and regular rhythm.     Heart sounds: No murmur heard. Pulmonary:     Effort: Pulmonary effort is normal. No respiratory distress.     Comments: Decreased air movement in bilateral lung bases, tachypnea, splinting. Ttp over right axillary chest Abdominal:     Palpations: Abdomen is soft.     Tenderness: There is no abdominal tenderness. There is no guarding or rebound.  Musculoskeletal:        General: No tenderness.     Comments: Trace edema to BLE  Skin:    General: Skin is warm and dry.  Neurological:     Mental Status: She is alert and oriented to person, place, and time.  Psychiatric:        Behavior: Behavior normal.     ED Results / Procedures / Treatments   Labs (all labs ordered are listed, but only abnormal results are displayed) Labs Reviewed  BASIC METABOLIC PANEL - Abnormal;  Notable for the following components:      Result Value   CO2 21 (*)    Glucose, Bld 105 (*)    Calcium 8.0 (*)    All other components within normal limits  CBC - Abnormal; Notable for the following components:   RBC 3.84 (*)    Hemoglobin 11.7 (*)    RDW 17.3 (*)    All other components within normal limits  TROPONIN I (HIGH SENSITIVITY)  TROPONIN I (HIGH SENSITIVITY)    EKG None  Radiology DG Chest 2 View Result Date: 03/15/2023 CLINICAL DATA:  Chest pain EXAM: CHEST - 2 VIEW COMPARISON:  03/03/2023 FINDINGS: The lungs are symmetrically well expanded. Moderate right pleural effusion has enlarged since prior examination with associated right basilar compressive atelectasis. Small left pleural effusion is present, decreased in size  since prior examination. Ovoid opacity within the left mid lung zone is again identified which may correspond to loculated fluid within the fissure noted on CT examination of 02/21/2023. Irregular opacity has developed within the left mid lung zone with possible areas of cavitation, possibly infectious in the acute setting. No pneumothorax. Cardiac size within normal limits. Pulmonary vascularity is normal. No acute bone abnormality. IMPRESSION: 1. Moderate right pleural effusion, enlarged since prior examination. 2. Small left pleural effusion, decreased in size since prior examination. 3. Irregular opacity within the left mid lung zone with possible areas of cavitation, possibly infectious in the acute setting. This could be confirmed with CT examination. Electronically Signed   By: Helyn Numbers M.D.   On: 03/15/2023 20:47    Procedures Procedures  {Document cardiac monitor, telemetry assessment procedure when appropriate:1}  Medications Ordered in ED Medications - No data to display  ED Course/ Medical Decision Making/ A&P   {   Click here for ABCD2, HEART and other calculatorsREFRESH Note before signing :1}                              Medical Decision Making Amount and/or Complexity of Data Reviewed Labs: ordered. Radiology: ordered.   ***  {Document critical care time when appropriate:1} {Document review of labs and clinical decision tools ie heart score, Chads2Vasc2 etc:1}  {Document your independent review of radiology images, and any outside records:1} {Document your discussion with family members, caretakers, and with consultants:1} {Document social determinants of health affecting pt's care:1} {Document your decision making why or why not admission, treatments were needed:1} Final Clinical Impression(s) / ED Diagnoses Final diagnoses:  None    Rx / DC Orders ED Discharge Orders     None

## 2023-03-15 NOTE — ED Triage Notes (Signed)
PER EMS: pt is from [facility] with c/o sudden onset of non-radiating sharp right sided chest pain associated with mils shortness of breath. Recent admission at [facility] for Pyothorax and PE 11 days ago.   BP- 118/77, HR-81, O2-91% RA and ems put her on 1L [place] and O2 increased to 95%.

## 2023-03-15 NOTE — ED Notes (Signed)
Patient transported to X-ray 

## 2023-03-16 ENCOUNTER — Inpatient Hospital Stay (HOSPITAL_COMMUNITY): Payer: Medicare Other

## 2023-03-16 ENCOUNTER — Emergency Department (HOSPITAL_COMMUNITY): Payer: Medicare Other

## 2023-03-16 DIAGNOSIS — R079 Chest pain, unspecified: Secondary | ICD-10-CM | POA: Diagnosis present

## 2023-03-16 DIAGNOSIS — I480 Paroxysmal atrial fibrillation: Secondary | ICD-10-CM | POA: Diagnosis present

## 2023-03-16 DIAGNOSIS — Z888 Allergy status to other drugs, medicaments and biological substances status: Secondary | ICD-10-CM | POA: Diagnosis not present

## 2023-03-16 DIAGNOSIS — J90 Pleural effusion, not elsewhere classified: Secondary | ICD-10-CM | POA: Diagnosis not present

## 2023-03-16 DIAGNOSIS — Z681 Body mass index (BMI) 19 or less, adult: Secondary | ICD-10-CM | POA: Diagnosis not present

## 2023-03-16 DIAGNOSIS — K766 Portal hypertension: Secondary | ICD-10-CM | POA: Diagnosis present

## 2023-03-16 DIAGNOSIS — E43 Unspecified severe protein-calorie malnutrition: Secondary | ICD-10-CM | POA: Diagnosis present

## 2023-03-16 DIAGNOSIS — Z91138 Patient's unintentional underdosing of medication regimen for other reason: Secondary | ICD-10-CM | POA: Diagnosis not present

## 2023-03-16 DIAGNOSIS — Z7901 Long term (current) use of anticoagulants: Secondary | ICD-10-CM | POA: Diagnosis not present

## 2023-03-16 DIAGNOSIS — J948 Other specified pleural conditions: Secondary | ICD-10-CM | POA: Diagnosis present

## 2023-03-16 DIAGNOSIS — I8510 Secondary esophageal varices without bleeding: Secondary | ICD-10-CM | POA: Diagnosis present

## 2023-03-16 DIAGNOSIS — G894 Chronic pain syndrome: Secondary | ICD-10-CM | POA: Diagnosis present

## 2023-03-16 DIAGNOSIS — E872 Acidosis, unspecified: Secondary | ICD-10-CM | POA: Diagnosis present

## 2023-03-16 DIAGNOSIS — Z79899 Other long term (current) drug therapy: Secondary | ICD-10-CM | POA: Diagnosis not present

## 2023-03-16 DIAGNOSIS — R188 Other ascites: Secondary | ICD-10-CM | POA: Diagnosis present

## 2023-03-16 DIAGNOSIS — F1120 Opioid dependence, uncomplicated: Secondary | ICD-10-CM | POA: Diagnosis present

## 2023-03-16 DIAGNOSIS — R0600 Dyspnea, unspecified: Secondary | ICD-10-CM | POA: Diagnosis not present

## 2023-03-16 DIAGNOSIS — F17210 Nicotine dependence, cigarettes, uncomplicated: Secondary | ICD-10-CM | POA: Diagnosis present

## 2023-03-16 DIAGNOSIS — Z86711 Personal history of pulmonary embolism: Secondary | ICD-10-CM | POA: Diagnosis not present

## 2023-03-16 DIAGNOSIS — B182 Chronic viral hepatitis C: Secondary | ICD-10-CM | POA: Diagnosis present

## 2023-03-16 DIAGNOSIS — K7460 Unspecified cirrhosis of liver: Secondary | ICD-10-CM | POA: Diagnosis present

## 2023-03-16 DIAGNOSIS — Z886 Allergy status to analgesic agent status: Secondary | ICD-10-CM | POA: Diagnosis not present

## 2023-03-16 DIAGNOSIS — R54 Age-related physical debility: Secondary | ICD-10-CM | POA: Diagnosis present

## 2023-03-16 DIAGNOSIS — D638 Anemia in other chronic diseases classified elsewhere: Secondary | ICD-10-CM | POA: Diagnosis present

## 2023-03-16 DIAGNOSIS — I10 Essential (primary) hypertension: Secondary | ICD-10-CM | POA: Diagnosis present

## 2023-03-16 DIAGNOSIS — Z66 Do not resuscitate: Secondary | ICD-10-CM | POA: Diagnosis present

## 2023-03-16 DIAGNOSIS — T45516A Underdosing of anticoagulants, initial encounter: Secondary | ICD-10-CM | POA: Diagnosis present

## 2023-03-16 LAB — CBC
HCT: 37.3 % (ref 36.0–46.0)
Hemoglobin: 11.5 g/dL — ABNORMAL LOW (ref 12.0–15.0)
MCH: 29.6 pg (ref 26.0–34.0)
MCHC: 30.8 g/dL (ref 30.0–36.0)
MCV: 95.9 fL (ref 80.0–100.0)
Platelets: 173 10*3/uL (ref 150–400)
RBC: 3.89 MIL/uL (ref 3.87–5.11)
RDW: 17.4 % — ABNORMAL HIGH (ref 11.5–15.5)
WBC: 7.3 10*3/uL (ref 4.0–10.5)
nRBC: 0 % (ref 0.0–0.2)

## 2023-03-16 LAB — GRAM STAIN

## 2023-03-16 LAB — BODY FLUID CELL COUNT WITH DIFFERENTIAL
Eos, Fluid: 0 %
Lymphs, Fluid: 32 %
Monocyte-Macrophage-Serous Fluid: 1 % — ABNORMAL LOW (ref 50–90)
Neutrophil Count, Fluid: 67 % — ABNORMAL HIGH (ref 0–25)
Total Nucleated Cell Count, Fluid: 3170 uL — ABNORMAL HIGH (ref 0–1000)

## 2023-03-16 LAB — PROTIME-INR
INR: 1.2 (ref 0.8–1.2)
Prothrombin Time: 15.5 s — ABNORMAL HIGH (ref 11.4–15.2)

## 2023-03-16 LAB — COMPREHENSIVE METABOLIC PANEL
ALT: 26 U/L (ref 0–44)
AST: 37 U/L (ref 15–41)
Albumin: 1.8 g/dL — ABNORMAL LOW (ref 3.5–5.0)
Alkaline Phosphatase: 120 U/L (ref 38–126)
Anion gap: 6 (ref 5–15)
BUN: 11 mg/dL (ref 8–23)
CO2: 24 mmol/L (ref 22–32)
Calcium: 8.1 mg/dL — ABNORMAL LOW (ref 8.9–10.3)
Chloride: 107 mmol/L (ref 98–111)
Creatinine, Ser: 0.88 mg/dL (ref 0.44–1.00)
GFR, Estimated: >60 mL/min (ref >60)
Glucose, Bld: 89 mg/dL (ref 70–99)
Potassium: 3.8 mmol/L (ref 3.5–5.1)
Sodium: 137 mmol/L (ref 135–145)
Total Bilirubin: 1 mg/dL (ref <1.2)
Total Protein: 6.1 g/dL — ABNORMAL LOW (ref 6.5–8.1)

## 2023-03-16 LAB — GLUCOSE, PLEURAL OR PERITONEAL FLUID: Glucose, Fluid: 82 mg/dL

## 2023-03-16 LAB — HEPATIC FUNCTION PANEL
ALT: 29 U/L (ref 0–44)
AST: 46 U/L — ABNORMAL HIGH (ref 15–41)
Albumin: 1.8 g/dL — ABNORMAL LOW (ref 3.5–5.0)
Alkaline Phosphatase: 131 U/L — ABNORMAL HIGH (ref 38–126)
Bilirubin, Direct: 0.2 mg/dL (ref 0.0–0.2)
Indirect Bilirubin: 0.5 mg/dL (ref 0.3–0.9)
Total Bilirubin: 0.7 mg/dL (ref <1.2)
Total Protein: 6.1 g/dL — ABNORMAL LOW (ref 6.5–8.1)

## 2023-03-16 LAB — LACTATE DEHYDROGENASE, PLEURAL OR PERITONEAL FLUID: LD, Fluid: 181 U/L — ABNORMAL HIGH (ref 3–23)

## 2023-03-16 LAB — PROTEIN, PLEURAL OR PERITONEAL FLUID: Total protein, fluid: 3.1 g/dL

## 2023-03-16 LAB — I-STAT CG4 LACTIC ACID, ED: Lactic Acid, Venous: 1.6 mmol/L (ref 0.5–1.9)

## 2023-03-16 LAB — PHOSPHORUS: Phosphorus: 3.4 mg/dL (ref 2.5–4.6)

## 2023-03-16 LAB — MAGNESIUM: Magnesium: 1.9 mg/dL (ref 1.7–2.4)

## 2023-03-16 MED ORDER — PIPERACILLIN-TAZOBACTAM 3.375 G IVPB 30 MIN
3.3750 g | Freq: Once | INTRAVENOUS | Status: AC
  Administered 2023-03-16: 3.375 g via INTRAVENOUS
  Filled 2023-03-16: qty 50

## 2023-03-16 MED ORDER — PROCHLORPERAZINE EDISYLATE 10 MG/2ML IJ SOLN: 5.0000 mg | Freq: Four times a day (QID) | INTRAMUSCULAR | Status: DC | PRN

## 2023-03-16 MED ORDER — IOHEXOL 350 MG/ML SOLN
50.0000 mL | Freq: Once | INTRAVENOUS | Status: AC | PRN
  Administered 2023-03-16: 50 mL via INTRAVENOUS

## 2023-03-16 MED ORDER — OXYCODONE HCL 5 MG PO TABS
5.0000 mg | ORAL_TABLET | Freq: Four times a day (QID) | ORAL | Status: DC | PRN
  Administered 2023-03-16 (×2): 5 mg via ORAL
  Filled 2023-03-16 (×3): qty 1

## 2023-03-16 MED ORDER — PIPERACILLIN-TAZOBACTAM 3.375 G IVPB
3.3750 g | Freq: Three times a day (TID) | INTRAVENOUS | Status: DC
  Administered 2023-03-16 – 2023-03-18 (×7): 3.375 g via INTRAVENOUS
  Filled 2023-03-16 (×7): qty 50

## 2023-03-16 MED ORDER — HYDROMORPHONE HCL 1 MG/ML IJ SOLN
0.5000 mg | INTRAMUSCULAR | Status: DC | PRN
  Administered 2023-03-16 – 2023-03-17 (×3): 0.5 mg via INTRAVENOUS
  Filled 2023-03-16 (×3): qty 0.5

## 2023-03-16 MED ORDER — ACETAMINOPHEN 325 MG PO TABS: 650.0000 mg | ORAL_TABLET | Freq: Four times a day (QID) | ORAL | Status: DC | PRN

## 2023-03-16 MED ORDER — LIDOCAINE HCL (PF) 1 % IJ SOLN
10.0000 mL | Freq: Once | INTRAMUSCULAR | Status: AC
  Administered 2023-03-16: 10 mL via INTRADERMAL

## 2023-03-16 MED ORDER — MELATONIN 5 MG PO TABS: 5.0000 mg | ORAL_TABLET | Freq: Every evening | ORAL | Status: DC | PRN

## 2023-03-16 MED ORDER — SPIRONOLACTONE 25 MG PO TABS
100.0000 mg | ORAL_TABLET | Freq: Every day | ORAL | Status: DC
  Administered 2023-03-16 – 2023-03-18 (×2): 100 mg via ORAL
  Filled 2023-03-16 (×3): qty 4

## 2023-03-16 MED ORDER — PREGABALIN 100 MG PO CAPS
300.0000 mg | ORAL_CAPSULE | Freq: Two times a day (BID) | ORAL | Status: DC
  Administered 2023-03-16 – 2023-03-18 (×4): 300 mg via ORAL
  Filled 2023-03-16 (×5): qty 3

## 2023-03-16 MED ORDER — PANTOPRAZOLE SODIUM 40 MG PO TBEC
40.0000 mg | DELAYED_RELEASE_TABLET | Freq: Every day | ORAL | Status: DC
Start: 2023-03-16 — End: 2023-03-18
  Administered 2023-03-16 – 2023-03-18 (×2): 40 mg via ORAL
  Filled 2023-03-16 (×3): qty 1

## 2023-03-16 MED ORDER — POLYETHYLENE GLYCOL 3350 17 G PO PACK: 17.0000 g | PACK | Freq: Every day | ORAL | Status: DC | PRN

## 2023-03-16 MED ORDER — APIXABAN 5 MG PO TABS
5.0000 mg | ORAL_TABLET | Freq: Two times a day (BID) | ORAL | Status: DC
  Administered 2023-03-16 – 2023-03-18 (×4): 5 mg via ORAL
  Filled 2023-03-16 (×5): qty 1

## 2023-03-16 NOTE — ED Notes (Signed)
1st lac normal, 2nd not needed unless dr says otherwise. Can be discontinued

## 2023-03-16 NOTE — Progress Notes (Signed)
Pharmacy Antibiotic Note  Anita Price is a 67 y.o. female admitted on 03/15/2023 with pneumonia. Previous pleural L lung culture on 02/17/23 with strep intermedius (sensitive to penicillins). Pharmacy has been consulted for zosyn dosing.  Plan: Zosyn 3.375g over 30 minute infusion x 1 in ED followed by zosyn 3.375g EI q8h  F/u renal function, length of therapy and narrow as able  Height: 5\' 5"  (165.1 cm) Weight: 42.6 kg (94 lb) IBW/kg (Calculated) : 57  Temp (24hrs), Avg:98.6 F (37 C), Min:98.6 F (37 C), Max:98.6 F (37 C)  Recent Labs  Lab 03/15/23 2025 03/16/23 0017  WBC 10.3  --   CREATININE 0.58  --   LATICACIDVEN  --  1.6    Estimated Creatinine Clearance: 46.5 mL/min (by C-G formula based on SCr of 0.58 mg/dL).    Allergies  Allergen Reactions   Celecoxib Other (See Comments)    Insomnia   Duloxetine Hcl Other (See Comments)    Insomnia   Tizanidine Nausea And Vomiting   Venlafaxine Other (See Comments)    Hallucinations   Effersyllium [Psyllium] Other (See Comments)    Made the patient "feel badly"   Escitalopram Other (See Comments)    insomnia   Neurontin [Gabapentin] Other (See Comments)    Made the patient "feel badly"   Tramadol Other (See Comments)    Made the patient "feel badly"    Antimicrobials this admission: Zosyn 12/15 >  Microbiology results: 12/15 BCx: IP   Thank you for allowing pharmacy to be a part of this patient's care.  Marja Kays 03/16/2023 3:08 AM

## 2023-03-16 NOTE — Procedures (Signed)
PROCEDURE SUMMARY:  Successful US guided right thoracentesis. Yielded 950 mL of amber fluid. Patient tolerated procedure well. No immediate complications. EBL = trace  Specimen was sent for labs.  Post procedure chest X-ray reveals no pneumothorax  Ruth Tully S Rudell Ortman PA-C 03/16/2023 10:21 AM

## 2023-03-16 NOTE — H&P (Addendum)
History and Physical  Anita Price Anita Price:[id number] DOB: 01-21-1956 DOA: 03/15/2023  Referring physician: Dr. Madilyn Hook, EDP  PCP: Jackie Plum, MD  Outpatient Specialists: IR, pulmonary Patient coming from: Home.  Chief Complaint: Right-sided chest pain  HPI: Anita Price is a 67 y.o. female with medical history significant for chronic hepatitis C, liver cirrhosis, tobacco abuse, chronic pain syndrome, paroxysmal A-fib noncompliant with Eliquis, cervical radiculopathy status post C3-C6 with anterior cervical discectomy and fusion, hypertension, recently admitted on 02/13/2023 and discharged on 03/06/2023 for sepsis secondary to Streptococcus intermedius empyema, who returns to the ED due to worsening right-sided pleuritic pain for the past few days.  In the ED, chest x-ray showed moderate right pleural effusion worse since prior examination.  Irregular opacity within the left midlung zone with possible area of cavitation, possibly infectious, in the acute setting.  Subsequently, the patient had a CT chest with contrast.    Started on empiric IV antibiotics.  TRH, hospitalist service, was asked to admit for further management.  ED Course: Temperature 98.1.  BP 117/62, pulse 65, respiratory 14, saturation 94% on room air.  Lab studies significant for serum bicarb 21, glucose 105, albumin 1.8, AST 46.  Hemoglobin 11.7.  Review of Systems: Review of systems as noted in the HPI. All other systems reviewed and are negative.   Past Medical History:  Diagnosis Date   Acute metabolic encephalopathy    Chronic back pain    DJD (degenerative joint disease)    Essential hypertension 07/10/2020   Gallbladder sludge    Hepatitis C 07/10/2020   Liver cirrhosis (HCC)    Pain management    Prolonged QT interval    Protein calorie malnutrition (HCC)    Thyroid nodule    Tobacco use 07/10/2020   Past Surgical History:  Procedure Laterality Date   BACK SURGERY     btl     HERNIA REPAIR      WRIST SURGERY      Social History:  reports that she has been smoking. She has never used smokeless tobacco. She reports that she does not currently use drugs. She reports that she does not drink alcohol.   Allergies  Allergen Reactions   Celecoxib Other (See Comments)    Insomnia   Duloxetine Hcl Other (See Comments)    Insomnia   Tizanidine Nausea And Vomiting   Venlafaxine Other (See Comments)    Hallucinations   Effersyllium [Psyllium] Other (See Comments)    Made the patient "feel badly"   Escitalopram Other (See Comments)    insomnia   Neurontin [Gabapentin] Other (See Comments)    Made the patient "feel badly"   Tramadol Other (See Comments)    Made the patient "feel badly"    Family History  Problem Relation Age of Onset   Scoliosis Mother       Prior to Admission medications   Medication Sig Start Date End Date Taking? Authorizing Provider  apixaban (ELIQUIS) 5 MG TABS tablet Take 1 tablet (5 mg total) by mouth 2 (two) times daily. 03/06/23   Marinda Elk, MD  morphine (MSIR) 15 MG tablet Take 0.5 tablets (7.5 mg total) by mouth every 4 (four) hours as needed for severe pain (pain score 7-10). 03/06/23   Marinda Elk, MD  pantoprazole (PROTONIX) 40 MG tablet Take 1 tablet (40 mg total) by mouth daily. 03/07/23   Marinda Elk, MD  pregabalin (LYRICA) 300 MG capsule Take 300 mg by mouth in the morning and  at bedtime. 01/03/20   [provider]  propranolol (INDERAL) 40 MG tablet Take 1 tablet (40 mg total) by mouth 3 (three) times daily. 03/06/23   Marinda Elk, MD  spironolactone (ALDACTONE) 100 MG tablet Take 1 tablet (100 mg total) by mouth daily. 03/07/23   Marinda Elk, MD    Physical Exam: BP 131/70   Pulse 70   Temp 98.6 F (37 C)   Resp (!) 21   Ht 5\' 5"  (1.651 m)   Wt 42.6 kg   SpO2 98%   BMI 15.64 kg/m   General: 67 y.o. year-old female frail-appearing in no acute distress.  Alert and oriented  x3. Cardiovascular: Regular rate and rhythm with no rubs or gallops.  No thyromegaly or JVD noted.  Trace lower extremity edema bilaterally. Respiratory: Mild rales at bases with poor inspiratory effort. Abdomen: Soft nontender nondistended with normal bowel sounds x4 quadrants. Muskuloskeletal: No cyanosis or clubbing noted bilaterally Neuro: CN II-XII intact, strength, sensation, reflexes Skin: No ulcerative lesions noted or rashes Psychiatry: Judgement and insight appear normal. Mood is appropriate for condition and setting          Labs on Admission:  Basic Metabolic Panel: Recent Labs  Lab 03/15/23 2025  NA 136  K 3.7  CL 108  CO2 21*  GLUCOSE 105*  BUN 11  CREATININE 0.58  CALCIUM 8.0*   Liver Function Tests: Recent Labs  Lab 03/15/23 2342  AST 46*  ALT 29  ALKPHOS 131*  BILITOT 0.7  PROT 6.1*  ALBUMIN 1.8*   No results for input(s): "LIPASE", "AMYLASE" in the last 168 hours. No results for input(s): "AMMONIA" in the last 168 hours. CBC: Recent Labs  Lab 03/15/23 2025  WBC 10.3  HGB 11.7*  HCT 36.3  MCV 94.5  PLT 216   Cardiac Enzymes: No results for input(s): "CKTOTAL", "CKMB", "CKMBINDEX", "TROPONINI" in the last 168 hours.  BNP (last 3 results) No results for input(s): "BNP" in the last 8760 hours.  ProBNP (last 3 results) No results for input(s): "PROBNP" in the last 8760 hours.  CBG: No results for input(s): "GLUCAP" in the last 168 hours.  Radiological Exams on Admission: CT Chest W Contrast Result Date: 03/16/2023 CLINICAL DATA:  Irregular opacity or cavitary nodule in the left lung on AP and lateral chest today. EXAM: CT CHEST WITH CONTRAST TECHNIQUE: Multidetector CT imaging of the chest was performed during intravenous contrast administration. RADIATION DOSE REDUCTION: This exam was performed according to the departmental dose-optimization program which includes automated exposure control, adjustment of the mA and/or kV according to  patient size and/or use of iterative reconstruction technique. CONTRAST:  50mL OMNIPAQUE IOHEXOL 350 MG/ML SOLN COMPARISON:  AP Lat chest yesterday, portable chest 03/03/2023, portable chest 02/23/2023, CTA chest 02/21/2023 and chest CT without contrast 02/17/2023. FINDINGS: Cardiovascular: The cardiac size is normal. A small pericardial effusion has developed anteriorly since the last CT. The CTA chest demonstrated right lower lobe segmental and subsegmental arterial emboli but this is not re-evaluated for on the current exam. Pulmonary arteries are normal in caliber and are at least centrally clear. The pulmonary veins are normal in caliber. The aorta and great vessels are normal and there are no visible coronary calcifications. 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 Mediastinum/Nodes: There is a heterogeneous 1.7 cm nodule in the left lobe of the thyroid gland in the inferior pole. Recommend nonemergent follow-up ultrasound. There are subcentimeter hypodense nodule higher up  in the left lobe. The right thyroid lobe is unremarkable. Axillary spaces are clear. There are no enlarged intrathoracic lymph nodes. No esophageal thickening, but there are distal esophageal varices. The trachea and main bronchi are patent. Lungs/Pleura: Left-sided pigtail chest tube has been removed since the last CT. There previously was a small layering left pleural effusion with scattered loculated fluid collections within the oblique fissure. There currently is a minimal left pleural effusion in the posterior chest base, and scattered small fluid pockets within the fissure, largest of these is anterior to the superior segment of the lower lobe measuring 2.6 cm on 3:89. There is subsegmental atelectatic change in the posterior left lower lobe. A 2 cm coarsely nodular opacity is newly noted in the left upper lobe laterally along the fissure, probably is nodular atelectasis or small  infiltrate. The lungs are mildly emphysematous biapical pleural-parenchymal scarring. A moderate-to-large sized right pleural effusion has developed since previous CT with compressive collapse of the right lower lobe, compressive atelectasis along the posterior right upper lobe. Some of this is probably loculated subpulmonic area. Underlying pneumonia would be difficult to exclude in the right lower lobe. The remaining lungs are clear.  No impacted bronchi are seen. Upper Abdomen: Cirrhotic liver with left upper quadrant varices. Fluid-filled cleft in the spleen again extends from the posterior capsule into the hilum consistent with a subacute laceration and was seen previously. 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 No increased perisplenic fluid is evident. There is minimal fluid adjacent to the spleen. Musculoskeletal: No acute or significant osseous findings. No chest wall mass. IMPRESSION: 1. Interval removal of left-sided pigtail chest tube with minimal left pleural effusion in the posterior chest base, and scattered small fluid pockets within the left oblique fissure. 2. 2 cm new coarsely nodular opacity in the left upper lobe laterally along the fissure, probably nodular atelectasis or a small infiltrate. 2-30-month follow-up study recommended. 3. Moderate-to-large sized right pleural effusion with compressive collapse of the right lower lobe, compressive atelectasis along the posterior right upper lobe. Some of this is probably loculated in the subpulmonic area. Underlying pneumonia would be difficult to exclude in the right lower lobe. 4. Small pericardial effusion has developed anteriorly since the last CT. 5. Emphysema. 6. Cirrhosis with left upper quadrant varices. 7. Subacute laceration of the spleen with again noted small infarct in the anterior spleen. No increased perisplenic fluid is evident. 8. 1.7 cm heterogeneous nodule in the left lobe of the thyroid gland. Recommend nonemergent  follow-up ultrasound. 9. Aortic atherosclerosis. Aortic Atherosclerosis (ICD10-I70.0) and Emphysema (ICD10-J43.9). Electronically Signed   By: Almira Bar M.D.   On: 03/16/2023 02:08   DG Chest 2 View Result Date: 03/15/2023 CLINICAL DATA:  Chest pain EXAM: CHEST - 2 VIEW COMPARISON:  03/03/2023 FINDINGS: The lungs are symmetrically well expanded. Moderate right pleural effusion has enlarged since prior examination with associated right basilar compressive atelectasis. Small left pleural effusion is present, decreased in size since prior examination. Ovoid opacity within the left mid lung zone is again identified which may correspond to loculated fluid within the fissure noted on CT examination of 02/21/2023. Irregular opacity has developed within the left mid lung zone with possible areas of cavitation, possibly infectious in the acute setting. No pneumothorax. Cardiac size within normal limits. Pulmonary vascularity is normal. No acute bone abnormality. IMPRESSION: 1. Moderate right pleural effusion, enlarged since prior examination. 2. Small left pleural effusion, decreased in size since prior examination. 3. Irregular opacity within  the left mid lung zone with possible areas of cavitation, possibly infectious in the acute setting. This could be confirmed with CT examination. Electronically Signed   By: Helyn Numbers M.D.   On: 03/15/2023 20:47    EKG: I independently viewed the EKG done and my findings are as followed: Normal sinus rhythm rate of 79.  Nonspecific ST-T changes.  QTc 449.  Assessment/Plan Present on Admission:  Pleural effusion  Principal Problem:   Pleural effusion  Worsening complicated moderate to large right pleural effusion, POA IR consulted for possible thoracentesis Empiric IV antibiotics, IV Zosyn, continue. Follow cultures for ID and sensitivities. May need a chest tube placed if empyema is present Maintain O2 saturation above 92%  Paroxysmal A-fib on Eliquis,  rate controlled Admits to noncompliance with Eliquis Resume home Eliquis for CVA prevention Monitor on telemetry.  Severe protein calorie malnutrition BMI 15 Albumin 1.8 Encourage increase in protein calorie intake as tolerated Dietitian consult  Mild non-anion gap metabolic acidosis Serum bicarb 21, anion gap 7 Encourage oral intake  Anemia of chronic disease Hemoglobin stable No reported overt bleeding.  Recently admitted and diagnosed with Streptococcus intermedius empyema The patient recently completed her course of antibiotics which was scheduled to end on 03/13/2023 Resume antibiotics on 03/16/2023  History of medication noncompliance Will need education States she was not aware to continue her home Eliquis  History of hepatitis C with cirrhosis Continue home lactulose as needed  Physical debility PT OT assessment Fall precautions  Elevated liver chemistries in the setting of untreated hepatitis C and cirrhosis Monitor for now Avoid hepatotoxic agents.   Critical care time: 65 minutes.   DVT prophylaxis: Home Eliquis  Code Status: DNR  Family Communication: None at bedside.  Disposition Plan: Admitted to telemetry medical unit.  Consults called: IR for possible thoracentesis.  Admission status: Inpatient status.   Status is: Inpatient The patient requires at least 2 midnights for further evaluation and treatment of present condition.  Darlin Drop MD [facility] Pager [phone]  If 7PM-7AM, please contact night-coverage [email or web address] Password [email or web address]  03/16/2023, 2:57 AM

## 2023-03-16 NOTE — ED Notes (Signed)
ED TO INPATIENT HANDOFF REPORT  ED Nurse Name and Phone #: Delice Bison, RN  S Name/Age/Gender Anita Price 67 y.o. female Room/Bed: 001C/001C  Code Status   Code Status: Limited: Do not attempt resuscitation (DNR) -DNR-LIMITED -Do Not Intubate/DNI   Home/SNF/Other Skilled nursing facility Patient oriented to: self, place, time, and situation Is this baseline? Yes   Triage Complete: Triage complete  Chief Complaint Pleural effusion [J90]  Triage Note PER EMS: pt is from [facility] with c/o sudden onset of non-radiating sharp right sided chest pain associated with mils shortness of breath. Recent admission at [facility] for Pyothorax and PE 11 days ago.   BP- 118/77, HR-81, O2-91% RA and ems put her on 1L [place] and O2 increased to 95%.    Allergies Allergies  Allergen Reactions   Celecoxib Other (See Comments)    Insomnia   Duloxetine Hcl Other (See Comments)    Insomnia   Tizanidine Nausea And Vomiting   Venlafaxine Other (See Comments)    Hallucinations   Effersyllium [Psyllium] Other (See Comments)    Made the patient "feel badly"   Escitalopram Other (See Comments)    insomnia   Neurontin [Gabapentin] Other (See Comments)    Made the patient "feel badly"   Tramadol Other (See Comments)    Made the patient "feel badly"    Level of Care/Admitting Diagnosis ED Disposition     ED Disposition  Admit   Condition  --   Comment  Hospital Area: [facility] [[id number]]  Level of Care: Telemetry Medical [104]  May admit patient to [facility] or [facility] if equivalent level of care is available:: Yes  Covid Evaluation: Asymptomatic - no recent exposure (last 10 days) testing not required  Diagnosis: Pleural effusion [242230]  Admitting Physician: Darlin Drop [[id number]]  Attending Physician: Darlin Drop [[id number]]  Certification:: I certify this patient will need inpatient services for at least 2 midnights  Expected Medical Readiness:  03/18/2023          B Medical/Surgery History Past Medical History:  Diagnosis Date   Acute metabolic encephalopathy    Chronic back pain    DJD (degenerative joint disease)    Essential hypertension 07/10/2020   Gallbladder sludge    Hepatitis C 07/10/2020   Liver cirrhosis (HCC)    Pain management    Prolonged QT interval    Protein calorie malnutrition (HCC)    Thyroid nodule    Tobacco use 07/10/2020   Past Surgical History:  Procedure Laterality Date   BACK SURGERY     btl     HERNIA REPAIR     WRIST SURGERY       A IV Location/Drains/Wounds Patient Lines/Drains/Airways Status     Active Line/Drains/Airways     Name Placement date Placement time Site Days   Peripheral IV 03/15/23 20 G Anterior;Right Antecubital 03/15/23  2024  Antecubital  1   Wound / Incision (Open or Dehisced) 02/22/23 (IAD) Incontinence Associated Dermatitis;Irritant Dermatitis (Moisture Associated Skin Damage) Buttocks Right;Left;Medial excoriated, redness 02/22/23  2300  Buttocks  22            Intake/Output Last 24 hours No intake or output data in the 24 hours ending 03/16/23 1914  Labs/Imaging Results for orders placed or performed during the hospital encounter of 03/15/23 (from the past 48 hours)  Basic metabolic panel     Status: Abnormal   Collection Time: 03/15/23  8:25 PM  Result Value Ref  Range   Sodium 136 135 - 145 mmol/L   Potassium 3.7 3.5 - 5.1 mmol/L   Chloride 108 98 - 111 mmol/L   CO2 21 (L) 22 - 32 mmol/L   Glucose, Bld 105 (H) 70 - 99 mg/dL    Comment: Glucose reference range applies only to samples taken after fasting for at least 8 hours.   BUN 11 8 - 23 mg/dL   Creatinine, Ser 2.84 0.44 - 1.00 mg/dL   Calcium 8.0 (L) 8.9 - 10.3 mg/dL   GFR, Estimated >13 >24 mL/min    Comment: (NOTE) Calculated using the CKD-EPI Creatinine Equation (2021)    Anion gap 7 5 - 15    Comment: Performed at [facility], [address], [place], [place] [address]  CBC      Status: Abnormal   Collection Time: 03/15/23  8:25 PM  Result Value Ref Range   WBC 10.3 4.0 - 10.5 K/uL   RBC 3.84 (L) 3.87 - 5.11 MIL/uL   Hemoglobin 11.7 (L) 12.0 - 15.0 g/dL   HCT 72.5 36.6 - 44.0 %   MCV 94.5 80.0 - 100.0 fL   MCH 30.5 26.0 - 34.0 pg   MCHC 32.2 30.0 - 36.0 g/dL   RDW 34.7 (H) 42.5 - 95.6 %   Platelets 216 150 - 400 K/uL   nRBC 0.0 0.0 - 0.2 %    Comment: Performed at [facility], [address] [place], [place] [address]  Troponin I (High Sensitivity)     Status: None   Collection Time: 03/15/23  8:25 PM  Result Value Ref Range   Troponin I (High Sensitivity) 8 <18 ng/L    Comment: (NOTE) Elevated high sensitivity troponin I (hsTnI) values and significant  changes across serial measurements may suggest ACS but many other  chronic and acute conditions are known to elevate hsTnI results.  Refer to the "Links" section for chest pain algorithms and additional  guidance. Performed at [facility], [address] [place], [place] [address]   Troponin I (High Sensitivity)     Status: None   Collection Time: 03/15/23 10:20 PM  Result Value Ref Range   Troponin I (High Sensitivity) 9 <18 ng/L    Comment: (NOTE) Elevated high sensitivity troponin I (hsTnI) values and significant  changes across serial measurements may suggest ACS but many other  chronic and acute conditions are known to elevate hsTnI results.  Refer to the "Links" section for chest pain algorithms and additional  guidance. Performed at [facility], [address], [place], [place] [address]   Hepatic function panel     Status: Abnormal   Collection Time: 03/15/23 11:42 PM  Result Value Ref Range   Total Protein 6.1 (L) 6.5 - 8.1 g/dL   Albumin 1.8 (L) 3.5 - 5.0 g/dL   AST 46 (H) 15 - 41 U/L   ALT 29 0 - 44 U/L   Alkaline Phosphatase 131 (H) 38 - 126 U/L   Total Bilirubin 0.7 <1.2 mg/dL   Bilirubin, Direct 0.2 0.0 - 0.2 mg/dL   Indirect Bilirubin 0.5 0.3 - 0.9  mg/dL    Comment: Performed at [facility], [address] [place], [place] [address]  Protime-INR     Status: Abnormal   Collection Time: 03/15/23 11:42 PM  Result Value Ref Range   Prothrombin Time 15.5 (H) 11.4 - 15.2 seconds   INR 1.2 0.8 - 1.2  Comment: (NOTE) INR goal varies based on device and disease states. Performed at [facility], [address], [place], [place] [address]   I-Stat Lactic Acid     Status: None   Collection Time: 03/16/23 12:17 AM  Result Value Ref Range   Lactic Acid, Venous 1.6 0.5 - 1.9 mmol/L  CBC     Status: Abnormal   Collection Time: 03/16/23  7:27 AM  Result Value Ref Range   WBC 7.3 4.0 - 10.5 K/uL   RBC 3.89 3.87 - 5.11 MIL/uL   Hemoglobin 11.5 (L) 12.0 - 15.0 g/dL   HCT 11.9 14.7 - 82.9 %   MCV 95.9 80.0 - 100.0 fL   MCH 29.6 26.0 - 34.0 pg   MCHC 30.8 30.0 - 36.0 g/dL   RDW 56.2 (H) 13.0 - 86.5 %   Platelets 173 150 - 400 K/uL   nRBC 0.0 0.0 - 0.2 %    Comment: Performed at [facility], [address] [place], [place] [address]   CT Chest W Contrast Result Date: 03/16/2023 CLINICAL DATA:  Irregular opacity or cavitary nodule in the left lung on AP and lateral chest today. EXAM: CT CHEST WITH CONTRAST TECHNIQUE: Multidetector CT imaging of the chest was performed during intravenous contrast administration. RADIATION DOSE REDUCTION: This exam was performed according to the departmental dose-optimization program which includes automated exposure control, adjustment of the mA and/or kV according to patient size and/or use of iterative reconstruction technique. CONTRAST:  50mL OMNIPAQUE IOHEXOL 350 MG/ML SOLN COMPARISON:  AP Lat chest yesterday, portable chest 03/03/2023, portable chest 02/23/2023, CTA chest 02/21/2023 and chest CT without contrast 02/17/2023. FINDINGS: Cardiovascular: The cardiac size is normal. A small pericardial effusion has developed anteriorly since the last CT. The CTA chest demonstrated right lower  lobe segmental and subsegmental arterial emboli but this is not re-evaluated for on the current exam. Pulmonary arteries are normal in caliber and are at least centrally clear. The pulmonary veins are normal in caliber. The aorta and great vessels are normal and there are no visible coronary calcifications. 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 Mediastinum/Nodes: There is a heterogeneous 1.7 cm nodule in the left lobe of the thyroid gland in the inferior pole. Recommend nonemergent follow-up ultrasound. There are subcentimeter hypodense nodule higher up in the left lobe. The right thyroid lobe is unremarkable. Axillary spaces are clear. There are no enlarged intrathoracic lymph nodes. No esophageal thickening, but there are distal esophageal varices. The trachea and main bronchi are patent. Lungs/Pleura: Left-sided pigtail chest tube has been removed since the last CT. There previously was a small layering left pleural effusion with scattered loculated fluid collections within the oblique fissure. There currently is a minimal left pleural effusion in the posterior chest base, and scattered small fluid pockets within the fissure, largest of these is anterior to the superior segment of the lower lobe measuring 2.6 cm on 3:89. There is subsegmental atelectatic change in the posterior left lower lobe. A 2 cm coarsely nodular opacity is newly noted in the left upper lobe laterally along the fissure, probably is nodular atelectasis or small infiltrate. The lungs are mildly emphysematous biapical pleural-parenchymal scarring. A moderate-to-large sized right pleural effusion has developed since previous CT with compressive collapse of the right lower lobe, compressive atelectasis along the posterior right upper lobe. Some of this is probably loculated subpulmonic area. Underlying pneumonia would be difficult to exclude in the right lower lobe.  The remaining lungs are clear.   No impacted bronchi are seen. Upper Abdomen: Cirrhotic liver with left upper quadrant varices. Fluid-filled cleft in the spleen again extends from the posterior capsule into the hilum consistent with a subacute laceration and was seen previously. 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 No increased perisplenic fluid is evident. There is minimal fluid adjacent to the spleen. Musculoskeletal: No acute or significant osseous findings. No chest wall mass. IMPRESSION: 1. Interval removal of left-sided pigtail chest tube with minimal left pleural effusion in the posterior chest base, and scattered small fluid pockets within the left oblique fissure. 2. 2 cm new coarsely nodular opacity in the left upper lobe laterally along the fissure, probably nodular atelectasis or a small infiltrate. 2-20-month follow-up study recommended. 3. Moderate-to-large sized right pleural effusion with compressive collapse of the right lower lobe, compressive atelectasis along the posterior right upper lobe. Some of this is probably loculated in the subpulmonic area. Underlying pneumonia would be difficult to exclude in the right lower lobe. 4. Small pericardial effusion has developed anteriorly since the last CT. 5. Emphysema. 6. Cirrhosis with left upper quadrant varices. 7. Subacute laceration of the spleen with again noted small infarct in the anterior spleen. No increased perisplenic fluid is evident. 8. 1.7 cm heterogeneous nodule in the left lobe of the thyroid gland. Recommend nonemergent follow-up ultrasound. 9. Aortic atherosclerosis. Aortic Atherosclerosis (ICD10-I70.0) and Emphysema (ICD10-J43.9). Electronically Signed   By: Almira Bar M.D.   On: 03/16/2023 02:08   DG Chest 2 View Result Date: 03/15/2023 CLINICAL DATA:  Chest pain EXAM: CHEST - 2 VIEW COMPARISON:  03/03/2023 FINDINGS: The lungs are symmetrically well expanded. Moderate right pleural effusion has enlarged since prior examination with associated  right basilar compressive atelectasis. Small left pleural effusion is present, decreased in size since prior examination. Ovoid opacity within the left mid lung zone is again identified which may correspond to loculated fluid within the fissure noted on CT examination of 02/21/2023. Irregular opacity has developed within the left mid lung zone with possible areas of cavitation, possibly infectious in the acute setting. No pneumothorax. Cardiac size within normal limits. Pulmonary vascularity is normal. No acute bone abnormality. IMPRESSION: 1. Moderate right pleural effusion, enlarged since prior examination. 2. Small left pleural effusion, decreased in size since prior examination. 3. Irregular opacity within the left mid lung zone with possible areas of cavitation, possibly infectious in the acute setting. This could be confirmed with CT examination. Electronically Signed   By: Helyn Numbers M.D.   On: 03/15/2023 20:47    Pending Labs Unresulted Labs (From admission, onward)     Start     Ordered   03/16/23 0644  Comprehensive metabolic panel  Once,   R        03/16/23 0643   03/16/23 0644  Magnesium  Once,   R        03/16/23 0643   03/16/23 0644  Phosphorus  Once,   R        03/16/23 0643   03/15/23 2341  Culture, blood (routine x 2)  BLOOD CULTURE X 2,   R      03/15/23 2341            Vitals/Pain Today's Vitals   03/16/23 0700 03/16/23 0715 03/16/23 0730 03/16/23 0742  BP: 101/68 112/60 126/68   Pulse: 71 72 68   Resp: 12 11 18    Temp:    97.6 F (36.4 C)  TempSrc:  Oral  SpO2: 93% 96% 94%   Weight:      Height:      PainSc:        Isolation Precautions No active isolations  Medications Medications  acetaminophen (TYLENOL) tablet 650 mg (has no administration in time range)  prochlorperazine (COMPAZINE) injection 5 mg (has no administration in time range)  melatonin tablet 5 mg (has no administration in time range)  oxyCODONE (Oxy IR/ROXICODONE) immediate release  tablet 5 mg (has no administration in time range)  HYDROmorphone (DILAUDID) injection 0.5 mg (has no administration in time range)  apixaban (ELIQUIS) tablet 5 mg (has no administration in time range)  pantoprazole (PROTONIX) EC tablet 40 mg (has no administration in time range)  pregabalin (LYRICA) capsule 300 mg (has no administration in time range)  spironolactone (ALDACTONE) tablet 100 mg (has no administration in time range)  piperacillin-tazobactam (ZOSYN) IVPB 3.375 g (3.375 g Intravenous New Bag/Given 03/16/23 0732)  morphine (PF) 4 MG/ML injection 4 mg (4 mg Intravenous Given 03/16/23 0008)  iohexol (OMNIPAQUE) 350 MG/ML injection 50 mL (50 mLs Intravenous Contrast Given 03/16/23 0119)  piperacillin-tazobactam (ZOSYN) IVPB 3.375 g (0 g Intravenous Stopped 03/16/23 0433)    Mobility walks with device     Focused Assessments Cardiac Assessment Handoff:  Cardiac Rhythm: Normal sinus rhythm Lab Results  Component Value Date   CKTOTAL 211 07/03/2021   No results found for: "DDIMER" Does the Patient currently have chest pain? No    R Recommendations: See Admitting Provider Note  Report given to:   Additional Notes:

## 2023-03-16 NOTE — Consult Note (Signed)
NAME:  Anita Price, MRN:  [id number], DOB:  Aug 03, 1955, LOS: 0 ADMISSION DATE:  03/15/2023, CONSULTATION DATE:  03/16/2023 REFERRING MD:  Kathlen Mody, MD, CHIEF COMPLAINT:   right pleural effusion  History of Present Illness:  Anita Price is a 67 y.o. woman with multiple chronic medical conditions including decompensated Hep C cirrhosis with ascites and recent hospital stay for right sided strep intermedius empyema s/p chest tube drainage and thrombolytic therapy. She was discharged on total 4 week course of cefazolin. She was discharged to snf 11 days ago and then came back yesterday with worsening chest pain and shortness of breath, found to have recurrent right sided pleural effusion. This has been drained with thoracentesis by IR today. She endorses dyspnea, fatigue which is chronic and unchanged.   Pertinent  Medical History  Hep c cirrhosis, decompensated with ascites Recent RLL pulmonary emboli on systemic AC Severe protein calorie malnutrition Opioid dependence  Significant Hospital Events: Including procedures, antibiotic start and stop dates in addition to other pertinent events     Interim History / Subjective:    Objective   Blood pressure 122/72, pulse 77, temperature 97.6 F (36.4 C), temperature source Oral, resp. rate 20, height 5\' 5"  (1.651 m), weight 42.6 kg, SpO2 96%.       No intake or output data in the 24 hours ending 03/16/23 1639 Filed Weights   03/15/23 2017  Weight: 42.6 kg    Examination: General: thin, cachectic, temporal wasting HENT: mmm Lungs: shallow respirations  Cardiovascular: RRR Abdomen: soft, nontender Extremities: decreased muscle bulk and tone Neuro: normal speech, no focal asymmetry  Pleural fluid studies reviewed: LDH >10,000 ---->181 Glucose <20 ----->82 Total cells 70k ----->3k Neutrophil predominance  Resolved Hospital Problem list     Assessment & Plan:   Right pleural effusion Decompensated Hep C Cirrhosis  with Ascites Recent Left sided strep intermedius empyema Recent RLL pulmonary emboli on Ac  Pleural fluid studies reviewed - still transudative based on Light's criteria but dramatically improved LDH signifies essentially resolved infection. 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   Chest xray post procedure reviewed and shows near complete resolution of the right pleural effusion.  Management of the residual trace bilateral effusions with diuretics.  Would complete the abx for total 4 week course as recommended previous admission.   Please call with additional questions.   Durel Salts, MD Pulmonary and Critical Care Medicine [facility] 03/16/2023 4:52 PM Pager: see AMION  If no response to pager, please call critical care on call (see AMION) until 7pm After 7:00 pm call Elink     Labs   CBC: Recent Labs  Lab 03/15/23 2025 03/16/23 0727  WBC 10.3 7.3  HGB 11.7* 11.5*  HCT 36.3 37.3  MCV 94.5 95.9  PLT 216 173    Basic Metabolic Panel: Recent Labs  Lab 03/15/23 2025 03/16/23 0727  NA 136 137  K 3.7 3.8  CL 108 107  CO2 21* 24  GLUCOSE 105* 89  BUN 11 11  CREATININE 0.58 0.88  CALCIUM 8.0* 8.1*  MG  --  1.9  PHOS  --  3.4   GFR: Estimated Creatinine Clearance: 42.3 mL/min (by C-G formula based on SCr of 0.88 mg/dL). Recent Labs  Lab 03/15/23 2025 03/16/23 0017 03/16/23 0727  WBC 10.3  --  7.3  LATICACIDVEN  --  1.6  --     Liver Function Tests: Recent Labs  Lab 03/15/23 2342 03/16/23 0727  AST 46* 37  ALT 29 26  ALKPHOS 131* 120  BILITOT 0.7 1.0  PROT 6.1* 6.1*  ALBUMIN 1.8* 1.8*   No results for input(s): "LIPASE", "AMYLASE" in the last 168 hours. No results for  input(s): "AMMONIA" in the last 168 hours.  ABG    Component Value Date/Time   PHART 7.49 (H) 02/16/2023 0333   PCO2ART 33 02/16/2023 0333   PO2ART 98 02/16/2023 0333   HCO3 25.1 02/16/2023 0333   TCO2 22 07/10/2020 0135   ACIDBASEDEF 5.2 (H) 02/13/2023 1108   O2SAT 99.7 02/16/2023 0333     Coagulation Profile: Recent Labs  Lab 03/15/23 2342  INR 1.2    Cardiac Enzymes: No results for input(s): "CKTOTAL", "CKMB", "CKMBINDEX", "TROPONINI" in the last 168 hours.  HbA1C: Hgb A1c MFr Bld  Date/Time Value Ref Range Status  07/10/2020 07:33 AM 4.6 (L) 4.8 - 5.6 % Final    Comment:    (NOTE) Pre diabetes:          5.7%-6.4%  Diabetes:              >6.4%  Glycemic control for   <7.0% adults with diabetes     CBG: No results for input(s): "GLUCAP" in the last 168 hours.  Review of Systems:   As above in hpi  Past Medical History:  She,  has a past medical history of Acute metabolic encephalopathy, Chronic back pain, DJD (degenerative joint disease), Essential hypertension (07/10/2020), Gallbladder sludge, Hepatitis C (07/10/2020), Liver cirrhosis (HCC), Pain management, Prolonged QT interval, Protein calorie malnutrition (HCC), Thyroid nodule, and Tobacco use (07/10/2020).   Surgical History:   Past Surgical History:  Procedure Laterality Date   BACK SURGERY     btl     HERNIA REPAIR     WRIST SURGERY       Social History:   reports that she has been smoking. She has never used smokeless tobacco. She reports that she does not currently use drugs. She reports that she does not drink alcohol.   Family History:  Her family history includes Scoliosis in her mother.   Allergies Allergies  Allergen Reactions   Celecoxib Other (See Comments)    Insomnia   Duloxetine Hcl Other (See Comments)    Insomnia   Tizanidine Nausea And Vomiting   Venlafaxine Other (See Comments)    Hallucinations   Effersyllium [Psyllium] Other (See Comments)    Made the patient "feel  badly"   Escitalopram Other (See Comments)    insomnia   Neurontin [Gabapentin] Other (See Comments)    Made the patient "feel badly"   Tramadol Other (See Comments)    Made the patient "feel badly"     Home Medications  Prior to Admission medications   Medication Sig Start Date End Date Taking? Authorizing Provider  apixaban (ELIQUIS) 5 MG TABS tablet Take 1 tablet (5 mg total) by mouth 2 (two) times daily. 03/06/23  Yes Marinda Elk, MD  diclofenac Sodium (VOLTAREN) 1 % GEL Apply 2 g topically 4 (four) times daily.   Yes [provider]  pantoprazole (PROTONIX) 40 MG tablet Take 1 tablet (40 mg total) by mouth daily. 03/07/23  Yes Marinda Elk, MD  pregabalin (LYRICA) 300 MG capsule Take 300 mg by mouth  in the morning and at bedtime. 01/03/20  Yes [provider]  propranolol (INDERAL) 40 MG tablet Take 1 tablet (40 mg total) by mouth 3 (three) times daily. 03/06/23  Yes Marinda Elk, MD  spironolactone (ALDACTONE) 100 MG tablet Take 1 tablet (100 mg total) by mouth daily. 03/07/23  Yes Marinda Elk, MD  Morphine Sulfate ER 15 MG TBEA Take 1 tablet by mouth every 4 (four) hours as needed (Severe pain).    [provider]

## 2023-03-16 NOTE — ED Notes (Signed)
ED TO INPATIENT HANDOFF REPORT  ED Nurse Name and Phone #:   S Name/Age/Gender Anita Price 67 y.o. female Room/Bed: 015C/015C  Code Status   Code Status: Prior  Home/SNF/ther [name] independent living Patient oriented to: self, place, time, and situation Is this baseline? Yes   Triage Complete: Triage complete  Chief Complaint Pleural effusion [J90]  Triage Note PER EMS: pt is from [name] with c/o sudden onset of non-radiating sharp right sided chest pain associated with mils shortness of breath. Recent admission at [name] for Pyothorax and PE 11 days ago.   BP- 118/77, HR-81, O2-91% RA and ems put her on 1L [name] and O2 increased to 95%.    Allergies Allergies  Allergen Reactions   Celecoxib Other (See Comments)    Insomnia   Duloxetine Hcl Other (See Comments)    Insomnia   Tizanidine Nausea And Vomiting   Venlafaxine Other (See Comments)    Hallucinations   Effersyllium [Psyllium] Other (See Comments)    Made the patient "feel badly"   Escitalopram Other (See Comments)    insomnia   Neurontin [Gabapentin] Other (See Comments)    Made the patient "feel badly"   Tramadol Other (See Comments)    Made the patient "feel badly"    Level of Care/Admitting Diagnosis ED Disposition     ED Disposition  Admit   Condition  --   Comment  Hospital Area: [facility] [[id number]]  Level of Care: Telemetry Medical [104]  May admit patient to [facility] or [facility] if equivalent level of care is available:: Yes  Covid Evaluation: Asymptomatic - no recent exposure (last 10 days) testing not required  Diagnosis: Pleural effusion [242230]  Admitting Physician: Darlin Drop [[id number]]  Attending Physician: Darlin Drop [[id number]]  Certification:: I certify this patient will need inpatient services for at least 2 midnights  Expected Medical Readiness: 03/18/2023          B Medical/Surgery History Past Medical  History:  Diagnosis Date   Acute metabolic encephalopathy    Chronic back pain    DJD (degenerative joint disease)    Essential hypertension 07/10/2020   Gallbladder sludge    Hepatitis C 07/10/2020   Liver cirrhosis (HCC)    Pain management    Prolonged QT interval    Protein calorie malnutrition (HCC)    Thyroid nodule    Tobacco use 07/10/2020   Past Surgical History:  Procedure Laterality Date   BACK SURGERY     btl     HERNIA REPAIR     WRIST SURGERY       A IV Location/Drains/Wounds Patient Lines/Drains/Airways Status     Active Line/Drains/Airways     Name Placement date Placement time Site Days   Peripheral IV 03/15/23 20 G Anterior;Right Antecubital 03/15/23  2024  Antecubital  1   Wound / Incision (Open or Dehisced) 02/22/23 (IAD) Incontinence Associated Dermatitis;Irritant Dermatitis (Moisture Associated Skin Damage) Buttocks Right;Left;Medial excoriated, redness 02/22/23  2300  Buttocks  22            Intake/Output Last 24 hours No intake or output data in the 24 hours ending 03/16/23 0443  Labs/Imaging Results for orders placed or performed during the hospital encounter of 03/15/23 (from the past 48 hours)  Basic metabolic panel     Status: Abnormal   Collection Time: 03/15/23  8:25 PM  Result Value Ref Range   Sodium 136 135 - 145 mmol/L  Potassium 3.7 3.5 - 5.1 mmol/L   Chloride 108 98 - 111 mmol/L   CO2 21 (L) 22 - 32 mmol/L   Glucose, Bld 105 (H) 70 - 99 mg/dL    Comment: Glucose reference range applies only to samples taken after fasting for at least 8 hours.   BUN 11 8 - 23 mg/dL   Creatinine, Ser 1.61 0.44 - 1.00 mg/dL   Calcium 8.0 (L) 8.9 - 10.3 mg/dL   GFR, Estimated >09 >60 mL/min    Comment: (NOTE) Calculated using the CKD-EPI Creatinine Equation (2021)    Anion gap 7 5 - 15    Comment: Performed at [facility], [address] [place], [place] [address]  CBC     Status: Abnormal   Collection Time: 03/15/23  8:25 PM  Result  Value Ref Range   WBC 10.3 4.0 - 10.5 K/uL   RBC 3.84 (L) 3.87 - 5.11 MIL/uL   Hemoglobin 11.7 (L) 12.0 - 15.0 g/dL   HCT 81.1 91.4 - 78.2 %   MCV 94.5 80.0 - 100.0 fL   MCH 30.5 26.0 - 34.0 pg   MCHC 32.2 30.0 - 36.0 g/dL   RDW 95.6 (H) 21.3 - 08.6 %   Platelets 216 150 - 400 K/uL   nRBC 0.0 0.0 - 0.2 %    Comment: Performed at [facility], [address] [place], [place] [address]  Troponin I (High Sensitivity)     Status: None   Collection Time: 03/15/23  8:25 PM  Result Value Ref Range   Troponin I (High Sensitivity) 8 <18 ng/L    Comment: (NOTE) Elevated high sensitivity troponin I (hsTnI) values and significant  changes across serial measurements may suggest ACS but many other  chronic and acute conditions are known to elevate hsTnI results.  Refer to the "Links" section for chest pain algorithms and additional  guidance. Performed at [facility], [address] [place], [place] [address]   Troponin I (High Sensitivity)     Status: None   Collection Time: 03/15/23 10:20 PM  Result Value Ref Range   Troponin I (High Sensitivity) 9 <18 ng/L    Comment: (NOTE) Elevated high sensitivity troponin I (hsTnI) values and significant  changes across serial measurements may suggest ACS but many other  chronic and acute conditions are known to elevate hsTnI results.  Refer to the "Links" section for chest pain algorithms and additional  guidance. Performed at [facility], [address], [place], [place] [address]   Hepatic function panel     Status: Abnormal   Collection Time: 03/15/23 11:42 PM  Result Value Ref Range   Total Protein 6.1 (L) 6.5 - 8.1 g/dL   Albumin 1.8 (L) 3.5 - 5.0 g/dL   AST 46 (H) 15 - 41 U/L   ALT 29 0 - 44 U/L   Alkaline Phosphatase 131 (H) 38 - 126 U/L   Total Bilirubin 0.7 <1.2 mg/dL   Bilirubin, Direct 0.2 0.0 - 0.2 mg/dL   Indirect Bilirubin 0.5 0.3 - 0.9 mg/dL    Comment: Performed at [facility], [address] [place], [place] [address]  Protime-INR     Status: Abnormal   Collection Time: 03/15/23 11:42 PM  Result Value Ref Range   Prothrombin Time 15.5 (H) 11.4 - 15.2 seconds   INR 1.2 0.8 - 1.2    Comment: (NOTE) INR goal varies based on device and disease states.  Performed at [facility], [address] [place], [place] [address]   I-Stat Lactic Acid     Status: None   Collection Time: 03/16/23 12:17 AM  Result Value Ref Range   Lactic Acid, Venous 1.6 0.5 - 1.9 mmol/L   CT Chest W Contrast Result Date: 03/16/2023 CLINICAL DATA:  Irregular opacity or cavitary nodule in the left lung on AP and lateral chest today. EXAM: CT CHEST WITH CONTRAST TECHNIQUE: Multidetector CT imaging of the chest was performed during intravenous contrast administration. RADIATION DOSE REDUCTION: This exam was performed according to the departmental dose-optimization program which includes automated exposure control, adjustment of the mA and/or kV according to patient size and/or use of iterative reconstruction technique. CONTRAST:  50mL OMNIPAQUE IOHEXOL 350 MG/ML SOLN COMPARISON:  AP Lat chest yesterday, portable chest 03/03/2023, portable chest 02/23/2023, CTA chest 02/21/2023 and chest CT without contrast 02/17/2023. FINDINGS: Cardiovascular: The cardiac size is normal. A small pericardial effusion has developed anteriorly since the last CT. The CTA chest demonstrated right lower lobe segmental and subsegmental arterial emboli but this is not re-evaluated for on the current exam. Pulmonary arteries are normal in caliber and are at least centrally clear. The pulmonary veins are normal in caliber. The aorta and great vessels are normal and there are no visible coronary calcifications. 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 Mediastinum/Nodes: There is a heterogeneous 1.7 cm nodule in the left lobe of the thyroid gland in the inferior pole. Recommend nonemergent  follow-up ultrasound. There are subcentimeter hypodense nodule higher up in the left lobe. The right thyroid lobe is unremarkable. Axillary spaces are clear. There are no enlarged intrathoracic lymph nodes. No esophageal thickening, but there are distal esophageal varices. The trachea and main bronchi are patent. Lungs/Pleura: Left-sided pigtail chest tube has been removed since the last CT. There previously was a small layering left pleural effusion with scattered loculated fluid collections within the oblique fissure. There currently is a minimal left pleural effusion in the posterior chest base, and scattered small fluid pockets within the fissure, largest of these is anterior to the superior segment of the lower lobe measuring 2.6 cm on 3:89. There is subsegmental atelectatic change in the posterior left lower lobe. A 2 cm coarsely nodular opacity is newly noted in the left upper lobe laterally along the fissure, probably is nodular atelectasis or small infiltrate. The lungs are mildly emphysematous biapical pleural-parenchymal scarring. A moderate-to-large sized right pleural effusion has developed since previous CT with compressive collapse of the right lower lobe, compressive atelectasis along the posterior right upper lobe. Some of this is probably loculated subpulmonic area. Underlying pneumonia would be difficult to exclude in the right lower lobe. The remaining lungs are clear.  No impacted bronchi are seen. Upper Abdomen: Cirrhotic liver with left upper quadrant varices. Fluid-filled cleft in the spleen again extends from the posterior capsule into the hilum consistent with a subacute laceration and was seen previously. 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 No increased perisplenic fluid is evident. There is minimal fluid adjacent to the spleen. Musculoskeletal: No acute or significant osseous findings. No chest wall mass. IMPRESSION: 1. Interval removal of left-sided pigtail chest tube  with minimal left pleural effusion in the posterior chest base, and scattered small fluid pockets within the left oblique fissure. 2. 2 cm new coarsely nodular opacity in the left upper lobe laterally along the fissure, probably nodular atelectasis or a small infiltrate. 2-20-month  follow-up study recommended. 3. Moderate-to-large sized right pleural effusion with compressive collapse of the right lower lobe, compressive atelectasis along the posterior right upper lobe. Some of this is probably loculated in the subpulmonic area. Underlying pneumonia would be difficult to exclude in the right lower lobe. 4. Small pericardial effusion has developed anteriorly since the last CT. 5. Emphysema. 6. Cirrhosis with left upper quadrant varices. 7. Subacute laceration of the spleen with again noted small infarct in the anterior spleen. No increased perisplenic fluid is evident. 8. 1.7 cm heterogeneous nodule in the left lobe of the thyroid gland. Recommend nonemergent follow-up ultrasound. 9. Aortic atherosclerosis. Aortic Atherosclerosis (ICD10-I70.0) and Emphysema (ICD10-J43.9). Electronically Signed   By: Almira Bar M.D.   On: 03/16/2023 02:08   DG Chest 2 View Result Date: 03/15/2023 CLINICAL DATA:  Chest pain EXAM: CHEST - 2 VIEW COMPARISON:  03/03/2023 FINDINGS: The lungs are symmetrically well expanded. Moderate right pleural effusion has enlarged since prior examination with associated right basilar compressive atelectasis. Small left pleural effusion is present, decreased in size since prior examination. Ovoid opacity within the left mid lung zone is again identified which may correspond to loculated fluid within the fissure noted on CT examination of 02/21/2023. Irregular opacity has developed within the left mid lung zone with possible areas of cavitation, possibly infectious in the acute setting. No pneumothorax. Cardiac size within normal limits. Pulmonary vascularity is normal. No acute bone abnormality.  IMPRESSION: 1. Moderate right pleural effusion, enlarged since prior examination. 2. Small left pleural effusion, decreased in size since prior examination. 3. Irregular opacity within the left mid lung zone with possible areas of cavitation, possibly infectious in the acute setting. This could be confirmed with CT examination. Electronically Signed   By: Helyn Numbers M.D.   On: 03/15/2023 20:47    Pending Labs Unresulted Labs (From admission, onward)     Start     Ordered   03/15/23 2341  Culture, blood (routine x 2)  BLOOD CULTURE X 2,   R      03/15/23 2341            Vitals/Pain Today's Vitals   03/16/23 0023 03/16/23 0030 03/16/23 0045 03/16/23 0352  BP:  120/72 131/70 111/76  Pulse:  74 70 73  Resp:  (!) 23 (!) 21 20  Temp:    98.1 F (36.7 C)  TempSrc:    Oral  SpO2:  97% 98% 97%  Weight:      Height:      PainSc: 5        Isolation Precautions No active isolations  Medications Medications  acetaminophen (TYLENOL) tablet 650 mg (has no administration in time range)  prochlorperazine (COMPAZINE) injection 5 mg (has no administration in time range)  polyethylene glycol (MIRALAX / GLYCOLAX) packet 17 g (has no administration in time range)  melatonin tablet 5 mg (has no administration in time range)  oxyCODONE (Oxy IR/ROXICODONE) immediate release tablet 5 mg (has no administration in time range)  HYDROmorphone (DILAUDID) injection 0.5 mg (has no administration in time range)  apixaban (ELIQUIS) tablet 5 mg (has no administration in time range)  pantoprazole (PROTONIX) EC tablet 40 mg (has no administration in time range)  pregabalin (LYRICA) capsule 300 mg (has no administration in time range)  spironolactone (ALDACTONE) tablet 100 mg (has no administration in time range)  piperacillin-tazobactam (ZOSYN) IVPB 3.375 g (has no administration in time range)  morphine (PF) 4 MG/ML injection 4 mg (4 mg Intravenous Given 03/16/23  0008)  iohexol (OMNIPAQUE) 350 MG/ML  injection 50 mL (50 mLs Intravenous Contrast Given 03/16/23 0119)  piperacillin-tazobactam (ZOSYN) IVPB 3.375 g (0 g Intravenous Stopped 03/16/23 0433)    Mobility walks with person assist     Focused Assessments    R Recommendations: See Admitting Provider Note  Report given to:   Additional Notes:

## 2023-03-16 NOTE — Progress Notes (Signed)
[facility]                                                                               Anita Price, is a 67 y.o. female, DOB - 07/26/1955, YTK:[id number] Admit date - 03/15/2023    Outpatient Primary Price for the patient is Anita Price  LOS - 0  days    Brief summary    Anita Price is a 67 y.o. female with medical history significant for chronic hepatitis C, liver cirrhosis, tobacco abuse, chronic pain syndrome, paroxysmal A-fib noncompliant with Eliquis, cervical radiculopathy status post C3-C6 with anterior cervical discectomy and fusion, hypertension, recently admitted on 02/13/2023 and discharged on 03/06/2023 for sepsis secondary to Streptococcus intermedius empyema, who returns to the ED due to worsening right-sided pleuritic pain for the past few days.   In the ED, chest x-ray showed moderate right pleural effusion worse since prior examination.  Irregular opacity within the left midlung zone with possible area of cavitation, possibly infectious, in the acute setting.  Subsequently, the patient had a CT chest with contrast.     Started on empiric IV antibiotics.  [facility], hospitalist service, was asked to admit for further management.  CT chest showed large pleural effusion. PCCM consulted for possible chest tube.  IR consulted for thoracentesis.    Assessment & Plan    Assessment and Plan:  Recurrent right pleural effusion sec to decompensated hepatitis C cirrhosis ? Recurrent right empyema:  IR consulted for thoracentesis.  PCCM consulted to see if she needs a chest tube.  Empirically started on IV zosyn.  Fluid to be sent for analysis and culture.   Estimated body mass index is 15.64 kg/m as calculated from the following:   Height as of this encounter: 5\' 5"  (1.651 m).   Weight as of this encounter: 42.6 kg.  Code Status: DNR limited.  DVT Prophylaxis:   apixaban (ELIQUIS) tablet 5 mg   Level of Care: Level of care: Telemetry  Medical Family Communication: none at bedside.  Disposition Plan:     Remains inpatient appropriate:  IV antibiotics.   Procedures:  Thoracentesis scheduled   Consultants:   PCCM Ir.   Antimicrobials:   Anti-infectives (From admission, onward)    Start     Dose/Rate Route Frequency Ordered Stop   03/16/23 0700  piperacillin-tazobactam (ZOSYN) IVPB 3.375 g        3.375 g 12.5 mL/hr over 240 Minutes Intravenous Every 8 hours 03/16/23 0307     03/16/23 0315  piperacillin-tazobactam (ZOSYN) IVPB 3.375 g        3.375 g 100 mL/hr over 30 Minutes Intravenous  Once 03/16/23 0307 03/16/23 0433        Medications  Scheduled Meds:  apixaban  5 mg Oral BID   pantoprazole  40 mg Oral Daily   pregabalin  300 mg Oral BID   spironolactone  100 mg Oral Daily   Continuous Infusions:  piperacillin-tazobactam (ZOSYN)  IV 3.375 g (03/16/23 1349)   PRN Meds:.acetaminophen, HYDROmorphone (DILAUDID) injection, melatonin, oxyCODONE, prochlorperazine    Subjective:   Anita Price was seen and examined today.  SOME SOB.    Objective:  Vitals:   03/16/23 0742 03/16/23 0830 03/16/23 1014 03/16/23 1021  BP:  111/69 122/72 122/72  Pulse:  77    Resp:  20    Temp: 97.6 F (36.4 C)     TempSrc: Oral     SpO2:  96%    Weight:      Height:       No intake or output data in the 24 hours ending 03/16/23 1806 Filed Weights   03/15/23 2017  Weight: 42.6 kg     Exam General exam: Appears calm and comfortable  Respiratory system: diminished at bases , more on the right than left. On [place] oxygen.  Cardiovascular system: S1 & S2 heard, RRR.  Gastrointestinal system: Abdomen is nondistended, soft and nontender. Central nervous system: Alert and oriented. No focal neurological deficits. Extremities: Symmetric 5 x 5 power. Skin: No rashes, lesions or ulcers Psychiatry: Mood & affect appropriate.     Data Reviewed:  I have personally reviewed following labs and imaging  studies   CBC Lab Results  Component Value Date   WBC 7.3 03/16/2023   RBC 3.89 03/16/2023   HGB 11.5 (L) 03/16/2023   HCT 37.3 03/16/2023   MCV 95.9 03/16/2023   MCH 29.6 03/16/2023   PLT 173 03/16/2023   MCHC 30.8 03/16/2023   RDW 17.4 (H) 03/16/2023   LYMPHSABS 1.8 02/18/2023   MONOABS 0.7 02/18/2023   EOSABS 0.2 02/18/2023   BASOSABS 0.1 02/18/2023     Last metabolic panel Lab Results  Component Value Date   NA 137 03/16/2023   K 3.8 03/16/2023   CL 107 03/16/2023   CO2 24 03/16/2023   BUN 11 03/16/2023   CREATININE 0.88 03/16/2023   GLUCOSE 89 03/16/2023   GFRNONAA >60 03/16/2023   GFRAA >60 12/17/2010   CALCIUM 8.1 (L) 03/16/2023   PHOS 3.4 03/16/2023   PROT 6.1 (L) 03/16/2023   ALBUMIN 1.8 (L) 03/16/2023   BILITOT 1.0 03/16/2023   ALKPHOS 120 03/16/2023   AST 37 03/16/2023   ALT 26 03/16/2023   ANIONGAP 6 03/16/2023    CBG (last 3)  No results for input(s): "GLUCAP" in the last 72 hours.    Coagulation Profile: Recent Labs  Lab 03/15/23 2342  INR 1.2     Radiology Studies: US THORACENTESIS ASP PLEURAL SPACE W/IMG GUIDE Result Date: 03/16/2023 INDICATION: Hepatitis-C and cirrhosis. History of left empyema requiring chest tube. Now with right pleural effusion. Request for diagnostic and therapeutic thoracentesis. EXAM: ULTRASOUND GUIDED RIGHT THORACENTESIS MEDICATIONS: 1% lidocaine 10 mL COMPLICATIONS: None immediate. PROCEDURE: An ultrasound guided thoracentesis was thoroughly discussed with the patient and questions answered. The benefits, risks, alternatives and complications were also discussed. The patient understands and wishes to proceed with the procedure. Written consent was obtained. Ultrasound was performed to localize and mark an adequate pocket of fluid in the right chest. The area was then prepped and draped in the normal sterile fashion. 1% Lidocaine was used for local anesthesia. Under ultrasound guidance a 6 Fr Safe-T-Centesis catheter  was introduced. Thoracentesis was performed. The catheter was removed and a dressing applied. FINDINGS: A total of approximately 950 mL of hazy amber fluid was removed. Samples were sent to the laboratory as requested by the clinical team. IMPRESSION: Successful ultrasound guided right thoracentesis yielding 950 mL of pleural fluid. No pneumothorax on post-procedure chest x-ray. Procedure performed by: Corrin Parker, PA-C Electronically Signed   By: Corlis Leak M.D.   On: 03/16/2023 11:49   DG Chest 1  View Result Date: 03/16/2023 CLINICAL DATA:  [id number] Pleural effusion on right [id number] EXAM: CHEST  1 VIEW COMPARISON:  Chest radiograph from one day prior. FINDINGS: Partially visualized surgical hardware from ACDF. Stable cardiomediastinal silhouette with normal heart size. No pneumothorax. Trace right pleural effusion, significantly decreased. Stable trace left pleural effusion. Mild patchy left parahilar lung opacities, unchanged. Improved right lung base aeration with residual mild right lung base atelectasis. IMPRESSION: 1. Trace right pleural effusion, significantly decreased. No pneumothorax. 2. Improved right lung base aeration with residual mild right lung base atelectasis. 3. Stable trace left pleural effusion. 4. Unchanged mild patchy left parahilar lung opacities, compatible with combination of trapped left pleural fluid along major fissure and irregular pulmonary nodule please see chest CT from earlier today for details and follow-up recommendations. Electronically Signed   By: Delbert Phenix M.D.   On: 03/16/2023 10:58   CT Chest W Contrast Result Date: 03/16/2023 CLINICAL DATA:  Irregular opacity or cavitary nodule in the left lung on AP and lateral chest today. EXAM: CT CHEST WITH CONTRAST TECHNIQUE: Multidetector CT imaging of the chest was performed during intravenous contrast administration. RADIATION DOSE REDUCTION: This exam was performed according to the departmental dose-optimization program  which includes automated exposure control, adjustment of the mA and/or kV according to patient size and/or use of iterative reconstruction technique. CONTRAST:  50mL OMNIPAQUE IOHEXOL 350 MG/ML SOLN COMPARISON:  AP Lat chest yesterday, portable chest 03/03/2023, portable chest 02/23/2023, CTA chest 02/21/2023 and chest CT without contrast 02/17/2023. FINDINGS: Cardiovascular: The cardiac size is normal. A small pericardial effusion has developed anteriorly since the last CT. The CTA chest demonstrated right lower lobe segmental and subsegmental arterial emboli but this is not re-evaluated for on the current exam. Pulmonary arteries are normal in caliber and are at least centrally clear. The pulmonary veins are normal in caliber. The aorta and great vessels are normal and there are no visible coronary calcifications. 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 Mediastinum/Nodes: There is a heterogeneous 1.7 cm nodule in the left lobe of the thyroid gland in the inferior pole. Recommend nonemergent follow-up ultrasound. There are subcentimeter hypodense nodule higher up in the left lobe. The right thyroid lobe is unremarkable. Axillary spaces are clear. There are no enlarged intrathoracic lymph nodes. No esophageal thickening, but there are distal esophageal varices. The trachea and main bronchi are patent. Lungs/Pleura: Left-sided pigtail chest tube has been removed since the last CT. There previously was a small layering left pleural effusion with scattered loculated fluid collections within the oblique fissure. There currently is a minimal left pleural effusion in the posterior chest base, and scattered small fluid pockets within the fissure, largest of these is anterior to the superior segment of the lower lobe measuring 2.6 cm on 3:89. There is subsegmental atelectatic change in the posterior left lower lobe. A 2 cm coarsely nodular opacity is newly noted in the left  upper lobe laterally along the fissure, probably is nodular atelectasis or small infiltrate. The lungs are mildly emphysematous biapical pleural-parenchymal scarring. A moderate-to-large sized right pleural effusion has developed since previous CT with compressive collapse of the right lower lobe, compressive atelectasis along the posterior right upper lobe. Some of this is probably loculated subpulmonic area. Underlying pneumonia would be difficult to exclude in the right lower lobe. The remaining lungs are clear.  No impacted bronchi are seen. Upper Abdomen: Cirrhotic liver with left upper quadrant varices. Fluid-filled cleft in the spleen again  extends from the posterior capsule into the hilum consistent with a subacute laceration and was seen previously. 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 No increased perisplenic fluid is evident. There is minimal fluid adjacent to the spleen. Musculoskeletal: No acute or significant osseous findings. No chest wall mass. IMPRESSION: 1. Interval removal of left-sided pigtail chest tube with minimal left pleural effusion in the posterior chest base, and scattered small fluid pockets within the left oblique fissure. 2. 2 cm new coarsely nodular opacity in the left upper lobe laterally along the fissure, probably nodular atelectasis or a small infiltrate. 2-43-month follow-up study recommended. 3. Moderate-to-large sized right pleural effusion with compressive collapse of the right lower lobe, compressive atelectasis along the posterior right upper lobe. Some of this is probably loculated in the subpulmonic area. Underlying pneumonia would be difficult to exclude in the right lower lobe. 4. Small pericardial effusion has developed anteriorly since the last CT. 5. Emphysema. 6. Cirrhosis with left upper quadrant varices. 7. Subacute laceration of the spleen with again noted small infarct in the anterior spleen. No increased perisplenic fluid is evident. 8. 1.7 cm  heterogeneous nodule in the left lobe of the thyroid gland. Recommend nonemergent follow-up ultrasound. 9. Aortic atherosclerosis. Aortic Atherosclerosis (ICD10-I70.0) and Emphysema (ICD10-J43.9). Electronically Signed   By: Almira Bar M.D.   On: 03/16/2023 02:08   DG Chest 2 View Result Date: 03/15/2023 CLINICAL DATA:  Chest pain EXAM: CHEST - 2 VIEW COMPARISON:  03/03/2023 FINDINGS: The lungs are symmetrically well expanded. Moderate right pleural effusion has enlarged since prior examination with associated right basilar compressive atelectasis. Small left pleural effusion is present, decreased in size since prior examination. Ovoid opacity within the left mid lung zone is again identified which may correspond to loculated fluid within the fissure noted on CT examination of 02/21/2023. Irregular opacity has developed within the left mid lung zone with possible areas of cavitation, possibly infectious in the acute setting. No pneumothorax. Cardiac size within normal limits. Pulmonary vascularity is normal. No acute bone abnormality. IMPRESSION: 1. Moderate right pleural effusion, enlarged since prior examination. 2. Small left pleural effusion, decreased in size since prior examination. 3. Irregular opacity within the left mid lung zone with possible areas of cavitation, possibly infectious in the acute setting. This could be confirmed with CT examination. Electronically Signed   By: Helyn Numbers M.D.   On: 03/15/2023 20:47       Kathlen Mody M.D. [facility] 03/16/2023, 6:06 PM  Available via Epic secure chat 7am-7pm After 7 pm, please refer to night coverage provider listed on amion.

## 2023-03-17 DIAGNOSIS — K7460 Unspecified cirrhosis of liver: Secondary | ICD-10-CM

## 2023-03-17 DIAGNOSIS — E43 Unspecified severe protein-calorie malnutrition: Secondary | ICD-10-CM | POA: Insufficient documentation

## 2023-03-17 DIAGNOSIS — K766 Portal hypertension: Secondary | ICD-10-CM

## 2023-03-17 DIAGNOSIS — R0600 Dyspnea, unspecified: Secondary | ICD-10-CM | POA: Diagnosis not present

## 2023-03-17 DIAGNOSIS — B182 Chronic viral hepatitis C: Secondary | ICD-10-CM

## 2023-03-17 DIAGNOSIS — J90 Pleural effusion, not elsewhere classified: Secondary | ICD-10-CM | POA: Diagnosis not present

## 2023-03-17 DIAGNOSIS — R188 Other ascites: Secondary | ICD-10-CM

## 2023-03-17 LAB — PATHOLOGIST SMEAR REVIEW

## 2023-03-17 MED ORDER — PROPRANOLOL HCL 10 MG PO TABS: 40.0000 mg | ORAL_TABLET | Freq: Three times a day (TID) | ORAL | Status: DC

## 2023-03-17 MED ORDER — ENSURE ENLIVE PO LIQD
237.0000 mL | Freq: Two times a day (BID) | ORAL | Status: DC
  Administered 2023-03-17 – 2023-03-18 (×3): 237 mL via ORAL

## 2023-03-17 MED ORDER — PROPRANOLOL HCL 10 MG PO TABS
40.0000 mg | ORAL_TABLET | Freq: Three times a day (TID) | ORAL | Status: DC
  Administered 2023-03-17 – 2023-03-18 (×3): 40 mg via ORAL
  Filled 2023-03-17 (×3): qty 4

## 2023-03-17 MED ORDER — ADULT MULTIVITAMIN W/MINERALS CH
1.0000 | ORAL_TABLET | Freq: Every day | ORAL | Status: DC
  Administered 2023-03-17 – 2023-03-18 (×2): 1 via ORAL
  Filled 2023-03-17 (×2): qty 1

## 2023-03-17 NOTE — Evaluation (Signed)
Occupational Therapy Evaluation Patient Details Name: Anita Price MRN: [id number] DOB: 07-19-1955 Today's Date: 03/17/2023   History of Present Illness Patient is a 67 yo female presenting to the ED with right-sided pleuritic pain on 03/15/23. Chest x ray showing showed large pleural effusion. Right thoracentesis completed on 12/15 with 950 mL drained. PMH includes: chronic hepatitis C, liver cirrhosis, tobacco abuse, chronic pain syndrome, paroxysmal A-fib noncompliant with Eliquis, cervical radiculopathy status post C3-C6 with anterior cervical discectomy and fusion, hypertension, recently admitted on 02/13/2023 and discharged on 03/06/2023 for sepsis secondary to Streptococcus intermedius empyema   Clinical Impression   Prior to this admission, patient at [facility] for rehab. Currently, patient presenting with lability, confusion, and decreased particpation. Patient initally willing to interact with OT, however then refusing all movement. Patient mod A for ADLs, and unable to assess bed mobility and transfers. OT recommending return to SNF when medically appropriate. OT will follow acutely.       If plan is discharge home, recommend the following: Two people to help with walking and/or transfers;A lot of help with bathing/dressing/bathroom;Assistance with cooking/housework;Direct supervision/assist for medications management;Assist for transportation;Help with stairs or ramp for entrance;Direct supervision/assist for financial management    Functional Status Assessment  Patient has had a recent decline in their functional status and demonstrates the ability to make significant improvements in function in a reasonable and predictable amount of time.  Equipment Recommendations  None recommended by OT    Recommendations for Other Services       Precautions / Restrictions Precautions Precautions: Fall Restrictions Weight Bearing Restrictions Per Provider Order: No      Mobility  Bed Mobility Overal bed mobility: Needs Assistance             General bed mobility comments: refusing mobility    Transfers Overall transfer level: Needs assistance                 General transfer comment: refusing mobility, labile and crying      Balance Overall balance assessment: Mild deficits observed, not formally tested                                         ADL either performed or assessed with clinical judgement   ADL Overall ADL's : Needs assistance/impaired Eating/Feeding: Set up;Bed level   Grooming: Set up;Bed level   Upper Body Bathing: Moderate assistance;Bed level   Lower Body Bathing: Bed level;Maximal assistance   Upper Body Dressing : Bed level;Moderate assistance   Lower Body Dressing: Bed level;Total assistance     Toilet Transfer Details (indicate cue type and reason): bed level unable to assess Toileting- Clothing Manipulation and Hygiene: Bed level;Total assistance       Functional mobility during ADLs: Moderate assistance;+2 for safety/equipment;+2 for physical assistance;Cueing for sequencing;Cueing for safety General ADL Comments: Patient presenting with lability, confusion, and decreased particpation. Patient initally willing to interact with OT, however then refusing all movement. Patient mod A for ADLs, and unable to assess bed mobility and transfers. OT recommending return to SNF when medically appropriate. OT will follow acutely.     Vision Baseline Vision/History: 0 No visual deficits Ability to See in Adequate Light: 0 Adequate Patient Visual Report: No change from baseline Vision Assessment?: No apparent visual deficits     Perception Perception: Not tested       Praxis Praxis: Not tested  Pertinent Vitals/Pain Pain Assessment Pain Assessment: 0-10 Pain Score: 6  Pain Location: lower back Pain Descriptors / Indicators: Discomfort, Grimacing, Guarding, Sharp, Shooting Pain  Intervention(s): Limited activity within patient's tolerance, Monitored during session, Patient requesting pain meds-RN notified     Extremity/Trunk Assessment Upper Extremity Assessment Upper Extremity Assessment: Generalized weakness;Right hand dominant   Lower Extremity Assessment Lower Extremity Assessment: Defer to PT evaluation       Communication     Cognition Arousal: Alert Behavior During Therapy: Anxious, Flat affect Overall Cognitive Status: Impaired/Different from baseline Area of Impairment: Orientation, Attention, Memory, Following commands, Safety/judgement, Awareness, Problem solving                 Orientation Level: Time, Situation Current Attention Level: Focused Memory: Decreased recall of precautions, Decreased short-term memory Following Commands: Follows one step commands with increased time Safety/Judgement: Decreased awareness of safety, Decreased awareness of deficits Awareness: Intellectual Problem Solving: Slow processing, Decreased initiation, Difficulty sequencing, Requires verbal cues, Requires tactile cues General Comments: Patient anxious, with near hyperventilation, stating "you are trying to trick me" when OT asking what holiday was in November     General Comments       Exercises     Shoulder Instructions      Home Living Family/patient expects to be discharged to:: Private residence Living Arrangements: Children (daughter) Available Help at Discharge: Family;Available 24 hours/day Type of Home: Apartment Home Access: Level entry     Home Layout: One level     Bathroom Shower/Tub: Tub/shower unit;Walk-in shower   Bathroom Toilet: Standard     Home Equipment: Agricultural consultant (2 wheels)          Prior Functioning/Environment Prior Level of Function : Independent/Modified Independent             Mobility Comments: occasionally used walker ADLs Comments: independent, able to manage finances and medications         OT Problem List: Decreased activity tolerance;Impaired balance (sitting and/or standing);Decreased coordination;Decreased safety awareness;Decreased knowledge of precautions;Decreased knowledge of use of DME or AE;Pain;Decreased strength;Decreased cognition;Decreased range of motion      OT Treatment/Interventions: Self-care/ADL training;Therapeutic exercise;DME and/or AE instruction;Therapeutic activities;Patient/family education;Balance training    OT Goals(Current goals can be found in the care plan section) Acute Rehab OT Goals Patient Stated Goal: unable OT Goal Formulation: Patient unable to participate in goal setting Time For Goal Achievement: 03/31/23 Potential to Achieve Goals: Fair ADL Goals Pt Will Perform Lower Body Bathing: with min assist;sitting/lateral leans;sit to/from stand Pt Will Perform Lower Body Dressing: with min assist;sitting/lateral leans;sit to/from stand Pt Will Transfer to Toilet: with min assist;stand pivot transfer;bedside commode Pt Will Perform Toileting - Clothing Manipulation and hygiene: with min assist;sitting/lateral leans;sit to/from stand Additional ADL Goal #1: Patient will be able to complete bed mobility at min A level as a precursor to OOB activities. Additional ADL Goal #2: Patient will be able to participate in functional ADL task sitting EOB (with supervision) for 5 minutes in order promote increased activity tolerance.  OT Frequency: Min 1X/week    Co-evaluation              AM-PAC OT "6 Clicks" Daily Activity     Outcome Measure Help from another person eating meals?: A Little Help from another person taking care of personal grooming?: A Lot Help from another person toileting, which includes using toliet, bedpan, or urinal?: Total Help from another person bathing (including washing, rinsing, drying)?: Total Help from another person to put  on and taking off regular upper body clothing?: Total Help from another person to put on  and taking off regular lower body clothing?: Total 6 Click Score: 9   End of Session Nurse Communication: Mobility status  Activity Tolerance: Patient limited by pain Patient left: in bed;with call bell/phone within reach;with bed alarm set  OT Visit Diagnosis: Muscle weakness (generalized) (M62.81);Pain;Other abnormalities of gait and mobility (R26.89);Adult, failure to thrive (R62.7);Unsteadiness on feet (R26.81);Other symptoms and signs involving cognitive function                Time: 8657-8469 OT Time Calculation (min): 11 min Charges:  OT General Charges $OT Visit: 1 Visit OT Evaluation $OT Eval Moderate Complexity: 1 Mod  Pollyann Glen E. Chaunice Obie, OTR/L Acute Rehabilitation Services [phone]   Quantasia Wambles 03/17/2023, 1:53 PM

## 2023-03-17 NOTE — Plan of Care (Signed)
  Problem: Education: Goal: Knowledge of General Education information will improve Description: Including pain rating scale, medication(s)/side effects and non-pharmacologic comfort measures Outcome: Progressing   Problem: Health Behavior/Discharge Planning: Goal: Ability to manage health-related needs will improve Outcome: Progressing   Problem: Clinical Measurements: Goal: Will remain free from infection Outcome: Progressing   Problem: Activity: Goal: Risk for activity intolerance will decrease Outcome: Progressing   Problem: Nutrition: Goal: Adequate nutrition will be maintained Outcome: Progressing   Problem: Pain Management: Goal: General experience of comfort will improve Outcome: Progressing

## 2023-03-17 NOTE — Consult Note (Addendum)
Consultation  Referring Provider: [facility]/ Elgergawy Primary Care Physician:  Jackie Plum, MD Primary Gastroenterologist:  Dr.Pyrtle-seen in hospital 2022 only, no office follow-up  Reason for Consultation: Chronic hepatitis C with cirrhosis, very recent left empyema now readmitted with progressive dyspnea with large right pleural effusion-question hepatic hydrothorax  HPI: Anita Price is a 67 y.o. female with history of hepatitis C which she says was diagnosed many years ago, never treated, history of chronic pain syndrome on chronic narcotics at home, cervical radiculopathy, low back pain, history of atrial fibrillation not compliant with Eliquis in the past. Patient was initially admitted 11/14 through 03/06/2023 after she presented with nausea and vomiting and was found on imaging to have a large loculated pleural effusion on the left with complete collapse of left lung and also identified splenic infarcts.  She was followed by PCCM, underwent thoracentesis, then chest tube placement and eventually lytic therapy and removal of chest tube.  She was sent out of the hospital to complete a course of antibiotics and went to skilled nursing facility. During that admission she had CT of the chest on 02/17/2023 that showed a cirrhotic appearing liver and a small amount of ascites, moderate to large left effusion measuring up to 20 cm also noted right basilar nodular opacities consistent with multifocal pneumonia. Follow-up CT of the chest abdomen and pelvis on 02/21/2023 showed moderate ascites, body wall edema, evidence of splenic infarct possible small laceration, cirrhosis with portal hypertension and distal esophageal varices noted.  At that time improved left empyema to residual small loculations.  Also had paracentesis during that admission on 02/22/2023 with 450 cc removed Gram stain negative, culture negative, counts not consistent with SBP fluid albumin less than 1.5  Fluid analysis  from the initial thoracentesis Glucose less than 20 Cytology negative Protein less than 3 Cell counts with total nucleated cells 71,355 fluid neutrophil count 100 LDH greater than 10,000 Body fluid culture grew Streptococcus intermedius  Blood cultures were negative.  She was readmitted to the hospital yesterday with progressive right-sided chest pain and increased dyspnea gradually progressive over the prior few days. Chest x-ray showed moderate right effusion worse since prior admit and irregular opacity in the left mid lung zone with possible area of cavitation possibly infectious. CT of the chest yesterday with contrast-new small pericardial effusion, paraesophageal as well as esophageal varices noted lower mediastinum, small layering left pleural effusion with scattered loculations, 2 cm coarsely nodular opacity in the left upper lobe possibly nodular atelectasis or small infiltrate.  Moderate to large right pleural effusion has developed since prior CT with compressive collapse of the right lower lobe and compressive atelectasis pneumonia difficult to rule out,, COPD  Thoracentesis on the right yesterday-950 cc fluid removed Fluid analysis-LDH 181 Fluid cell count-total nucleated cells 3107 /neutrophil 67 Total protein 3.1 Glucose 82 Smear negative for malignancy Culture no growth less than 24 hours Gram stain few WBC no organisms  Labs 03/16/2023 WBC 7.3/hemoglobin 11.5/hematocrit 37.3/platelets 173 Pro time 15.5/INR 1.2 Sodium 137/potassium 3.8/BUN 11/creatinine 0.88 Albumin 1.8 LFTs within normal limits  Patient says she really does not feel any better after the thoracentesis yesterday feels somewhat short of breath, no abdominal pain, no fever. She has not had any recent peripheral edema, had never had any fluid in her abdomen prior to the admission in November.  Current MELD=8 M ELD 3.0 = 12    Past Medical History:  Diagnosis Date   Acute metabolic encephalopathy     Chronic  back pain    DJD (degenerative joint disease)    Essential hypertension 07/10/2020   Gallbladder sludge    Hepatitis C 07/10/2020   Liver cirrhosis (HCC)    Pain management    Prolonged QT interval    Protein calorie malnutrition (HCC)    Thyroid nodule    Tobacco use 07/10/2020    Past Surgical History:  Procedure Laterality Date   BACK SURGERY     btl     HERNIA REPAIR     WRIST SURGERY      Prior to Admission medications   Medication Sig Start Date End Date Taking? Authorizing Provider  apixaban (ELIQUIS) 5 MG TABS tablet Take 1 tablet (5 mg total) by mouth 2 (two) times daily. 03/06/23  Yes Marinda Elk, MD  diclofenac Sodium (VOLTAREN) 1 % GEL Apply 2 g topically 4 (four) times daily.   Yes [provider]  pantoprazole (PROTONIX) 40 MG tablet Take 1 tablet (40 mg total) by mouth daily. 03/07/23  Yes Marinda Elk, MD  pregabalin (LYRICA) 300 MG capsule Take 300 mg by mouth in the morning and at bedtime. 01/03/20  Yes [provider]  propranolol (INDERAL) 40 MG tablet Take 1 tablet (40 mg total) by mouth 3 (three) times daily. 03/06/23  Yes Marinda Elk, MD  spironolactone (ALDACTONE) 100 MG tablet Take 1 tablet (100 mg total) by mouth daily. 03/07/23  Yes Marinda Elk, MD  Morphine Sulfate ER 15 MG TBEA Take 1 tablet by mouth every 4 (four) hours as needed (Severe pain).    [provider]    Current Facility-Administered Medications  Medication Dose Route Frequency Provider Last Rate Last Admin   acetaminophen (TYLENOL) tablet 650 mg  650 mg Oral Q6H PRN Darlin Drop, DO       apixaban (ELIQUIS) tablet 5 mg  5 mg Oral BID Dow Adolph N, DO   5 mg at 03/16/23 2157   feeding supplement (ENSURE ENLIVE / ENSURE PLUS) liquid 237 mL  237 mL Oral BID BM Elgergawy, Leana Roe, MD   237 mL at 03/17/23 1416   HYDROmorphone (DILAUDID) injection 0.5 mg  0.5 mg Intravenous Q4H PRN Dow Adolph N, DO   0.5 mg at 03/17/23  0422   melatonin tablet 5 mg  5 mg Oral QHS PRN Darlin Drop, DO       multivitamin with minerals tablet 1 tablet  1 tablet Oral Daily Elgergawy, Leana Roe, MD   1 tablet at 03/17/23 1416   oxyCODONE (Oxy IR/ROXICODONE) immediate release tablet 5 mg  5 mg Oral Q6H PRN Dow Adolph N, DO   5 mg at 03/16/23 2157   pantoprazole (PROTONIX) EC tablet 40 mg  40 mg Oral Daily Dow Adolph N, DO   40 mg at 03/16/23 1000   piperacillin-tazobactam (ZOSYN) IVPB 3.375 g  3.375 g Intravenous Q8H Hall, Carole N, DO 12.5 mL/hr at 03/17/23 1415 3.375 g at 03/17/23 1415   pregabalin (LYRICA) capsule 300 mg  300 mg Oral BID Dow Adolph N, DO   300 mg at 03/16/23 2157   prochlorperazine (COMPAZINE) injection 5 mg  5 mg Intravenous Q6H PRN Dow Adolph N, DO       propranolol (INDERAL) tablet 40 mg  40 mg Oral TID Elgergawy, Leana Roe, MD       spironolactone (ALDACTONE) tablet 100 mg  100 mg Oral Daily Dow Adolph N, DO   100 mg at 03/16/23 1000  Allergies as of 03/15/2023 - Review Complete 03/15/2023  Allergen Reaction Noted   Celecoxib Other (See Comments) 08/27/2011   Duloxetine hcl Other (See Comments) 03/01/2015   Tizanidine Nausea And Vomiting 10/24/2014   Venlafaxine Other (See Comments) 08/27/2011   Effersyllium [psyllium] Other (See Comments) 04/04/2011   Escitalopram Other (See Comments) 04/06/2012   Neurontin [gabapentin] Other (See Comments) 04/04/2011   Tramadol Other (See Comments) 04/04/2011    Family History  Problem Relation Age of Onset   Scoliosis Mother     Social History   Socioeconomic History   Marital status: Single    Spouse name: Not on file   Number of children: Not on file   Years of education: Not on file   Highest education level: Not on file  Occupational History   Not on file  Tobacco Use   Smoking status: Every Day    Current packs/day: 0.50    Types: Cigarettes   Smokeless tobacco: Never  Vaping Use   Vaping status: Never Used  Substance and Sexual  Activity   Alcohol use: No    Comment: used to drink wine on regular basis but not in the past 20 years-as per chart notes 06/2020   Drug use: Not Currently    Comment: 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   Sexual activity: Not on file  Other Topics Concern   Not on file  Social History Narrative   Not on file   Social Drivers of Health   Financial Resource Strain: Not on file  Food Insecurity: No Food Insecurity (03/16/2023)   Hunger Vital Sign    Worried About Running Out of Food in the Last Year: Never true    Ran Out of Food in the Last Year: Never true  Transportation Needs: No Transportation Needs (03/16/2023)   PRAPARE - Administrator, Civil Service (Medical): No    Lack of Transportation (Non-Medical): No  Physical Activity: Not on file  Stress: Not on file  Social Connections: Unknown (08/01/2021)   Received from [facility]   Social Network    Social Network: Not on file  Intimate Partner Violence: Not At Risk (03/16/2023)   Humiliation, Afraid, Rape, and Kick questionnaire    Fear of Current or Ex-Partner: No    Emotionally Abused: No    Physically Abused: No    Sexually Abused: No    Review of Systems: Pertinent positive and negative review of systems were noted in the above HPI section.  All other review of systems was otherwise negative. Marland Kitchen  Physical Exam: Vital signs in last 24 hours: Temp:  [97.7 F (36.5 C)-98.1 F (36.7 C)] 98.1 F (36.7 C) (12/16 1215) Pulse Rate:  [79-101] 79 (12/16 1215) Resp:  [20-28] 21 (12/16 1215) BP: (116-147)/(56-78) 147/78 (12/16 1215) SpO2:  [93 %-98 %] 98 % (12/16 1215)   General:   Alert,  Well-developed, very thin somewhat cachectic, frail appearing white female, cooperative in NAD, uncomfortable appearing does not want to move Head:  Normocephalic and atraumatic. Eyes:  Sclera clear, no icterus.   Conjunctiva pink. Ears:  Normal auditory acuity. Nose:  No deformity, discharge,  or lesions. Mouth:  No  deformity or lesions.   Neck:  Supple; no masses or thyromegaly. Lungs: Decreased breath sounds bilaterally lower fields right greater than left  heart: tachy Regular rate and rhythm; no murmurs, clicks, rubs,  or gallops. Abdomen:  Soft,nontender, BS active,nonpalp mass or hsm, no appreciable fluid wave Rectal:  not done Msk:  Symmetrical without gross deformities. . Pulses:  Normal pulses noted. Extremities:  Without clubbing or edema. Neurologic:  Alert and  oriented x4;  grossly normal neurologically. Skin:  Intact without significant lesions or rashes.. Psych:  Alert and cooperative. Normal mood and affect.  Intake/Output from previous day: 12/15 0701 - 12/16 0700 In: 128.1 [IV Piggyback:128.1] Out: -  Intake/Output this shift: No intake/output data recorded.  Lab Results: Recent Labs    03/15/23 2025 03/16/23 0727  WBC 10.3 7.3  HGB 11.7* 11.5*  HCT 36.3 37.3  PLT 216 173   BMET Recent Labs    03/15/23 2025 03/16/23 0727  NA 136 137  K 3.7 3.8  CL 108 107  CO2 21* 24  GLUCOSE 105* 89  BUN 11 11  CREATININE 0.58 0.88  CALCIUM 8.0* 8.1*   LFT Recent Labs    03/15/23 2342 03/16/23 0727  PROT 6.1* 6.1*  ALBUMIN 1.8* 1.8*  AST 46* 37  ALT 29 26  ALKPHOS 131* 120  BILITOT 0.7 1.0  BILIDIR 0.2  --   IBILI 0.5  --    PT/INR Recent Labs    03/15/23 2342  LABPROT 15.5*  INR 1.2     IMPRESSION:  #47  67 year old white female with history of longstanding untreated hepatitis C, and documented cirrhosis which appears to have had some progression now with obvious portal hypertension on imaging, and ascites Current MELD=8 Status post paracentesis last admission in November as part of workup for pleural effusion/empyema, cell counts negative for SBP She was started on Aldactone  #2 admission November 2024 with large left effusion and empyema requiring thoracentesis, chest tube and lytic therapy  #3 Readmission yesterday with progressive shortness of  breath and right-sided chest pain over the past few days and found to have a large right effusion-that is postthoracentesis with 950 cc removed Cell counts were compared to the left effusion and felt to be improved however this effusion is new, worse and on the opposite side  Question has been raised about hepatic hydrothorax Generally about 85% of hepatic hydrothorax on the right, rare  on the left or  bilateral.  Her MELD is low, generally hepatic hydrothorax is seen with severely decompensated cirrhosis, refractory ascites etc. which this patient does not have.  Cell counts from the initial left pleural effusion/empyema thoracentesis more consistent with exudate  Total PMNs from thoracentesis yesterday =2123-arguing against hepatic hydrothorax which suggest PMN of less than 250 2 level of 3.1 also argues against data suggest protein less than 2.5 consistent with hepatic hydrothorax I cannot calculate serum lactate ratio or serum to pleural fluid albumin ratio Pleural fluid to serum total protein ratio equals 0.5 data suggest less than 0.5 consistent with hepatic hydrothorax  #4 new PE and small splenic infarct-on Eliquis   PLAN: Agree with continuing Aldactone milligrams daily Or switching from propranolol to Corgard for portal hypertensive prophylaxis Continue Zosyn Hep C RNA quant pending Check serum LDH She will need referral to hepatology/probably [facility]ology after discharge for consideration of treatment and treatment of hep C.  If she needs to have repeat thoracentesis, please check protein, fluid LDH, fluid albumin, fluid bilirubin and pH.   Kalid Ghan PA-C  03/17/2023, 3:38 PM

## 2023-03-17 NOTE — Progress Notes (Signed)
Initial Nutrition Assessment  DOCUMENTATION CODES:   Severe malnutrition in context of chronic illness  INTERVENTION:  Liberalize diet Ensure Plus High Protein po BID, each supplement provides 350 kcal and 20 grams of protein. Multivitamin with minerals    NUTRITION DIAGNOSIS:   Severe Malnutrition related to chronic illness as evidenced by severe fat depletion, severe muscle depletion.    GOAL:   Patient will meet greater than or equal to 90% of their needs    MONITOR:   PO intake, Supplement acceptance  REASON FOR ASSESSMENT:   Consult Assessment of nutrition requirement/status  ASSESSMENT:   67 y.o. F presented with complaints of worsening right-sided pleuritic pain. Admitted with Pleural effusion.  Recently admitted on 02/13/2023 and discharged on 03/06/2023 for sepsis secondary to Streptococcus intermedius empyema. PMH: DJD, HTN, Hep C, Liver cirrhosis, PCM, Chronic back pain. Patient in bed covered up stated she was cold and was shivering. RD notified RN and they were to give Pt another blanket.  Pt stated that appetite was good and that she was eating most of her meals. She did not know of any weight loss. She has no chewing or swallowing concerns at this time. Discussed Ensure and she stated that she would drink them. She reported that she has always been on the small side. There is no benefit to excessive dietary restrictions related advanced age, increased nutrient needs . Patient would benefit better from liberalized diet to help meet increased needs and promote better oral intake.    Admit weight: 42.6 kg   Weight history:  03/15/23 42.6 kg  03/06/23 43 kg  06/30/21 49.9 kg  07/10/20 54.4 kg    Average Meal Intake: No current documentation.   Nutritionally Relevant Medications: Scheduled Meds:  apixaban  5 mg Oral BID   spironolactone  100 mg Oral Daily    Labs Reviewed    NUTRITION - FOCUSED PHYSICAL EXAM:  Flowsheet Row Most Recent Value   Orbital Region Severe depletion  Upper Arm Region Severe depletion  Thoracic and Lumbar Region Severe depletion  Buccal Region Severe depletion  Temple Region Severe depletion  Clavicle Bone Region Severe depletion  Clavicle and Acromion Bone Region Severe depletion  Scapular Bone Region Severe depletion  Dorsal Hand Severe depletion  Patellar Region Severe depletion  Anterior Thigh Region Severe depletion  Posterior Calf Region Severe depletion  Edema (RD Assessment) Mild  Hair Reviewed  Eyes Reviewed  Mouth Reviewed  Skin Reviewed  Nails Reviewed       Diet Order:   Diet Order             Diet Heart Room service appropriate? Yes; Fluid consistency: Thin  Diet effective now                   EDUCATION NEEDS:   Education needs have been addressed  Skin:  Skin Assessment: Reviewed RN Assessment  Last BM:  PTA  Height:   Ht Readings from Last 1 Encounters:  03/15/23 5\' 5"  (1.651 m)    Weight:   Wt Readings from Last 1 Encounters:  03/15/23 42.6 kg    Ideal Body Weight:     BMI:  Body mass index is 15.64 kg/m.  Estimated Nutritional Needs:   Kcal:  1650-1900 kcal  Protein:  75-85 g/d  Fluid:  48ml/kcal    Jamelle Haring RDN, LDN Clinical Dietitian  Pleas see Amion for contact information

## 2023-03-17 NOTE — Plan of Care (Signed)
  Problem: Clinical Measurements: Goal: Respiratory complications will improve Outcome: Progressing   Problem: Elimination: Goal: Will not experience complications related to bowel motility Outcome: Progressing Goal: Will not experience complications related to urinary retention Outcome: Progressing   Problem: Safety: Goal: Ability to remain free from injury will improve Outcome: Progressing   

## 2023-03-17 NOTE — Progress Notes (Addendum)
[facility]                                                                               Anita Price, is a 67 y.o. female, DOB - [birth year], ZOX:[id number] Admit date - 03/15/2023    Outpatient Primary MD for the patient is Osei-Bonsu, Anita Stallion, MD  LOS - 1  days    Brief summary    Anita Price is a 67 y.o. female with medical history significant for chronic hepatitis C, liver cirrhosis, tobacco abuse, chronic pain syndrome, paroxysmal A-fib noncompliant with Eliquis, cervical radiculopathy status post C3-C6 with anterior cervical discectomy and fusion, hypertension, recently admitted on 02/13/2023 and discharged on 03/06/2023 for sepsis secondary to Streptococcus intermedius empyema, who returns to the ED due to worsening right-sided pleuritic pain for the past few days.   In the ED, chest x-ray showed moderate right pleural effusion worse since prior examination.  Irregular opacity within the left midlung zone with possible area of cavitation, possibly infectious, in the acute setting.  Subsequently, the patient had a CT chest with contrast.     Started on empiric IV antibiotics.  TRH, hospitalist service, was asked to admit for further management.  CT chest showed large pleural effusion. PCCM consulted for possible chest tube.  IR consulted for thoracentesis.    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  And she may need TIPS if continues to recur.will request GI input. -He remains on Zosyn, I will continue for now pending final results on Gram stain and cultures  -He was encouraged with incentive spirometry, flutter valve.  Liver cirrhosis cirrhosis,  with known hep C -felt to be decompensated liver cirrhosis contributing to her ascites -She remains on Aldactone, propranolol -No encephalopathy, within normal limit, she is not on lactulose -Reports she was not treated for hep B hep C, will obtain HCVRNA quantitative-genotype, likely will need referral to [facility] for treatment  Paroxysmal A-fib on Eliquis, rate controlled Continue with Eliquis and propranolol   Severe protein calorie malnutrition BMI 15 Albumin 1.8 Encourage increase in protein calorie intake as tolerated Dietitian consult   Anemia of chronic disease Hemoglobin stable   Physical debility Severe deconditioning PT OT assessment Fall precautions         Estimated body mass index is 15.64 kg/m as calculated from the following:   Height as of this encounter: 5\' 5"  (1.651 m).   Weight as of this encounter: 42.6 kg.  Code Status: DNR limited.  DVT Prophylaxis:   apixaban (ELIQUIS) tablet 5 mg   Level of Care: Level of care: Telemetry Medical Family Communication: none at bedside.  Disposition Plan:     Remains inpatient appropriate:  IV antibiotics.   Procedures:  Thoracentesis scheduled   Consultants:   PCCM Ir.   Antimicrobials:   Anti-infectives (From admission, onward)    Start     Dose/Rate Route  Frequency Ordered Stop   03/16/23 0700  piperacillin-tazobactam (ZOSYN) IVPB 3.375 g        3.375 g 12.5 mL/hr over 240 Minutes Intravenous Every 8 hours 03/16/23 0307     03/16/23 0315  piperacillin-tazobactam (ZOSYN) IVPB 3.375 g        3.375 g 100 mL/hr over 30 Minutes Intravenous  Once 03/16/23 0307 03/16/23 0433        Medications  Scheduled Meds:  apixaban  5 mg Oral BID   feeding supplement  237 mL Oral BID BM   multivitamin with minerals  1 tablet Oral Daily   pantoprazole  40 mg Oral Daily   pregabalin  300 mg Oral BID   propranolol  40 mg Oral TID   propranolol  40 mg Oral TID   spironolactone  100 mg Oral Daily   Continuous  Infusions:  piperacillin-tazobactam (ZOSYN)  IV 3.375 g (03/17/23 1415)   PRN Meds:.acetaminophen, HYDROmorphone (DILAUDID) injection, melatonin, oxyCODONE, prochlorperazine    Subjective:   Anita Price generalized body ache, no dyspnea, no abdominal pain Objective:   Vitals:   03/17/23 0000 03/17/23 0419 03/17/23 0754 03/17/23 1215  BP: 116/74 121/77 120/63 (!) 147/78  Pulse: 88 88 83 79  Resp: (!) 21  20 (!) 21  Temp:  98 F (36.7 C) 97.7 F (36.5 C) 98.1 F (36.7 C)  TempSrc:  Oral Oral Oral  SpO2: 96% 93% 96% 98%  Weight:      Height:        Intake/Output Summary (Last 24 hours) at 03/17/2023 1422 Last data filed at 03/17/2023 0600 Gross per 24 hour  Intake 128.1 ml  Output --  Net 128.1 ml   Filed Weights   03/15/23 2017  Weight: 42.6 kg     Exam  Awake Alert, Oriented X 3, cachectic, extremely frail, deconditioned, chronically ill-appearing Symmetrical Chest wall movement, Decreased air entry at the bases, right> left RRR,No Gallops,Rubs or new Murmurs, No Parasternal Heave +ve B.Sounds, Abd Soft, No tenderness, No rebound - guarding or rigidity. No Cyanosis, Clubbing or edema, No new Rash or bruise      Data Reviewed:  I have personally reviewed following labs and imaging studies   CBC Lab Results  Component Value Date   WBC 7.3 03/16/2023   RBC 3.89 03/16/2023   HGB 11.5 (L) 03/16/2023   HCT 37.3 03/16/2023   MCV 95.9 03/16/2023   MCH 29.6 03/16/2023   PLT 173 03/16/2023   MCHC 30.8 03/16/2023   RDW 17.4 (H) 03/16/2023   LYMPHSABS 1.8 02/18/2023   MONOABS 0.7 02/18/2023   EOSABS 0.2 02/18/2023   BASOSABS 0.1 02/18/2023     Last metabolic panel Lab Results  Component Value Date   NA 137 03/16/2023   K 3.8 03/16/2023   CL 107 03/16/2023   CO2 24 03/16/2023   BUN 11 03/16/2023   CREATININE 0.88 03/16/2023   GLUCOSE 89 03/16/2023   GFRNONAA >60 03/16/2023   GFRAA >60 12/17/2010   CALCIUM 8.1 (L) 03/16/2023   PHOS 3.4 03/16/2023    PROT 6.1 (L) 03/16/2023   ALBUMIN 1.8 (L) 03/16/2023   BILITOT 1.0 03/16/2023   ALKPHOS 120 03/16/2023   AST 37 03/16/2023   ALT 26 03/16/2023   ANIONGAP 6 03/16/2023    CBG (last 3)  No results for input(s): "GLUCAP" in the last 72 hours.    Coagulation Profile: Recent Labs  Lab 03/15/23 2342  INR 1.2     Radiology Studies: US THORACENTESIS  ASP PLEURAL SPACE W/IMG GUIDE Result Date: 03/16/2023 INDICATION: Hepatitis-C and cirrhosis. History of left empyema requiring chest tube. Now with right pleural effusion. Request for diagnostic and therapeutic thoracentesis. EXAM: ULTRASOUND GUIDED RIGHT THORACENTESIS MEDICATIONS: 1% lidocaine 10 mL COMPLICATIONS: None immediate. PROCEDURE: An ultrasound guided thoracentesis was thoroughly discussed with the patient and questions answered. The benefits, risks, alternatives and complications were also discussed. The patient understands and wishes to proceed with the procedure. Written consent was obtained. Ultrasound was performed to localize and mark an adequate pocket of fluid in the right chest. The area was then prepped and draped in the normal sterile fashion. 1% Lidocaine was used for local anesthesia. Under ultrasound guidance a 6 Fr Safe-T-Centesis catheter was introduced. Thoracentesis was performed. The catheter was removed and a dressing applied. FINDINGS: A total of approximately 950 mL of hazy amber fluid was removed. Samples were sent to the laboratory as requested by the clinical team. IMPRESSION: Successful ultrasound guided right thoracentesis yielding 950 mL of pleural fluid. No pneumothorax on post-procedure chest x-ray. Procedure performed by: Corrin Parker, PA-C Electronically Signed   By: Corlis Leak M.D.   On: 03/16/2023 11:49   DG Chest 1 View Result Date: 03/16/2023 CLINICAL DATA:  [id number] Pleural effusion on right [id number] EXAM: CHEST  1 VIEW COMPARISON:  Chest radiograph from one day prior. FINDINGS: Partially visualized  surgical hardware from ACDF. Stable cardiomediastinal silhouette with normal heart size. No pneumothorax. Trace right pleural effusion, significantly decreased. Stable trace left pleural effusion. Mild patchy left parahilar lung opacities, unchanged. Improved right lung base aeration with residual mild right lung base atelectasis. IMPRESSION: 1. Trace right pleural effusion, significantly decreased. No pneumothorax. 2. Improved right lung base aeration with residual mild right lung base atelectasis. 3. Stable trace left pleural effusion. 4. Unchanged mild patchy left parahilar lung opacities, compatible with combination of trapped left pleural fluid along major fissure and irregular pulmonary nodule please see chest CT from earlier today for details and follow-up recommendations. Electronically Signed   By: Delbert Phenix M.D.   On: 03/16/2023 10:58   CT Chest W Contrast Result Date: 03/16/2023 CLINICAL DATA:  Irregular opacity or cavitary nodule in the left lung on AP and lateral chest today. EXAM: CT CHEST WITH CONTRAST TECHNIQUE: Multidetector CT imaging of the chest was performed during intravenous contrast administration. RADIATION DOSE REDUCTION: This exam was performed according to the departmental dose-optimization program which includes automated exposure control, adjustment of the mA and/or kV according to patient size and/or use of iterative reconstruction technique. CONTRAST:  50mL OMNIPAQUE IOHEXOL 350 MG/ML SOLN COMPARISON:  AP Lat chest yesterday, portable chest 03/03/2023, portable chest 02/23/2023, CTA chest 02/21/2023 and chest CT without contrast 02/17/2023. FINDINGS: Cardiovascular: The cardiac size is normal. A small pericardial effusion has developed anteriorly since the last CT. The CTA chest demonstrated right lower lobe segmental and subsegmental arterial emboli but this is not re-evaluated for on the current exam. Pulmonary arteries are normal in caliber and are at least centrally clear.  The pulmonary veins are normal in caliber. The aorta and great vessels are normal and there are no visible coronary calcifications. 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 Mediastinum/Nodes: There is a heterogeneous 1.7 cm nodule in the left lobe of the thyroid gland in the inferior pole. Recommend nonemergent follow-up ultrasound. There are subcentimeter hypodense nodule higher up in the left lobe. The right thyroid lobe is unremarkable. Axillary spaces are clear. There are no enlarged intrathoracic  lymph nodes. No esophageal thickening, but there are distal esophageal varices. The trachea and main bronchi are patent. Lungs/Pleura: Left-sided pigtail chest tube has been removed since the last CT. There previously was a small layering left pleural effusion with scattered loculated fluid collections within the oblique fissure. There currently is a minimal left pleural effusion in the posterior chest base, and scattered small fluid pockets within the fissure, largest of these is anterior to the superior segment of the lower lobe measuring 2.6 cm on 3:89. There is subsegmental atelectatic change in the posterior left lower lobe. A 2 cm coarsely nodular opacity is newly noted in the left upper lobe laterally along the fissure, probably is nodular atelectasis or small infiltrate. The lungs are mildly emphysematous biapical pleural-parenchymal scarring. A moderate-to-large sized right pleural effusion has developed since previous CT with compressive collapse of the right lower lobe, compressive atelectasis along the posterior right upper lobe. Some of this is probably loculated subpulmonic area. Underlying pneumonia would be difficult to exclude in the right lower lobe. The remaining lungs are clear.  No impacted bronchi are seen. Upper Abdomen: Cirrhotic liver with left upper quadrant varices. Fluid-filled cleft in the spleen again extends from the posterior capsule into  the hilum consistent with a subacute laceration and was seen previously. 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 No increased perisplenic fluid is evident. There is minimal fluid adjacent to the spleen. Musculoskeletal: No acute or significant osseous findings. No chest wall mass. IMPRESSION: 1. Interval removal of left-sided pigtail chest tube with minimal left pleural effusion in the posterior chest base, and scattered small fluid pockets within the left oblique fissure. 2. 2 cm new coarsely nodular opacity in the left upper lobe laterally along the fissure, probably nodular atelectasis or a small infiltrate. 2-79-month follow-up study recommended. 3. Moderate-to-large sized right pleural effusion with compressive collapse of the right lower lobe, compressive atelectasis along the posterior right upper lobe. Some of this is probably loculated in the subpulmonic area. Underlying pneumonia would be difficult to exclude in the right lower lobe. 4. Small pericardial effusion has developed anteriorly since the last CT. 5. Emphysema. 6. Cirrhosis with left upper quadrant varices. 7. Subacute laceration of the spleen with again noted small infarct in the anterior spleen. No increased perisplenic fluid is evident. 8. 1.7 cm heterogeneous nodule in the left lobe of the thyroid gland. Recommend nonemergent follow-up ultrasound. 9. Aortic atherosclerosis. Aortic Atherosclerosis (ICD10-I70.0) and Emphysema (ICD10-J43.9). Electronically Signed   By: Almira Bar M.D.   On: 03/16/2023 02:08   DG Chest 2 View Result Date: 03/15/2023 CLINICAL DATA:  Chest pain EXAM: CHEST - 2 VIEW COMPARISON:  03/03/2023 FINDINGS: The lungs are symmetrically well expanded. Moderate right pleural effusion has enlarged since prior examination with associated right basilar compressive atelectasis. Small left pleural effusion is present, decreased in size since prior examination. Ovoid opacity within the left mid lung zone is again  identified which may correspond to loculated fluid within the fissure noted on CT examination of 02/21/2023. Irregular opacity has developed within the left mid lung zone with possible areas of cavitation, possibly infectious in the acute setting. No pneumothorax. Cardiac size within normal limits. Pulmonary vascularity is normal. No acute bone abnormality. IMPRESSION: 1. Moderate right pleural effusion, enlarged since prior examination. 2. Small left pleural effusion, decreased in size since prior examination. 3. Irregular opacity within the left mid lung zone with possible areas of cavitation, possibly infectious in the acute setting. This could be  confirmed with CT examination. Electronically Signed   By: Helyn Numbers M.D.   On: 03/15/2023 20:47       Huey Bienenstock M.D. [facility] 03/17/2023, 2:22 PM  Available via Epic secure chat 7am-7pm After 7 pm, please refer to night coverage provider listed on amion.

## 2023-03-17 NOTE — Progress Notes (Signed)
PT Cancellation Note  Patient Details Name: Anita Price MRN: [id number] DOB: 02-May-1955   Cancelled Treatment:    Reason Eval/Treat Not Completed: Patient declined, no reason specified Patient declines all mobility at this time. Will re-attempt later as time allows.   Aaminah Forrester 03/17/2023, 9:43 AM

## 2023-03-17 NOTE — TOC Initial Note (Signed)
Transition of Care [facility]) - Initial/Assessment Note    Patient Details  Name: Anita Price MRN: [id number] Date of Birth: [birth year]  [facility] of [facility]) CM/SW Contact:    Mearl Latin, LCSW Phone Number: 03/17/2023, 9:23 AM  Clinical Narrative:                 Patient admitted from [facility] for rehab. CSW will continue to follow.   Expected Discharge Plan: Skilled Nursing Facility Barriers to Discharge: Continued Medical Work up   Patient Goals and CMS Choice Patient states their goals for this hospitalization and ongoing recovery are:: Rehab CMS Medicare.gov Compare Post Acute Care list provided to:: Patient Choice offered to / list presented to : Patient [place] ownership interest in [facility].provided to:: Patient    Expected Discharge Plan and Services In-house Referral: Clinical Social Work   Post Acute Care Choice: Skilled Nursing Facility Living arrangements for the past 2 months: Apartment, Skilled Nursing Facility                                      Prior Living Arrangements/Services Living arrangements for the past 2 months: Apartment, Skilled Nursing Facility Lives with:: Adult Children Patient language and need for interpreter reviewed:: Yes Do you feel safe going back to the place where you live?: Yes        Care giver support system in place?: Yes (comment)   Criminal Activity/Legal Involvement Pertinent to Current Situation/Hospitalization: No - Comment as needed  Activities of Daily Living   ADL Screening (condition at time of admission) Independently performs ADLs?: No Does the patient have a NEW difficulty with bathing/dressing/toileting/self-feeding that is expected to last >3 days?: Yes (Initiates electronic notice to provider for possible OT consult) Does the patient have a NEW difficulty with getting in/out of bed, walking, or climbing stairs that is expected to last >3 days?: Yes (Initiates electronic  notice to provider for possible PT consult) Does the patient have a NEW difficulty with communication that is expected to last >3 days?: No Is the patient deaf or have difficulty hearing?: No Does the patient have difficulty seeing, even when wearing glasses/contacts?: No Does the patient have difficulty concentrating, remembering, or making decisions?: No  Permission Sought/Granted Permission sought to share information with : Facility Medical sales representative, Family Supports Permission granted to share information with : Yes, Verbal Permission Granted  Share Information with NAME: [name] Daughter [phone]  Permission granted to share info w AGENCY: [facility]        Emotional Assessment Appearance:: Appears stated age     Orientation: : Oriented to Self, Oriented to Place, Oriented to  Time, Oriented to Situation Alcohol / Substance Use: Not Applicable Psych Involvement: No (comment)  Admission diagnosis:  Pleural effusion [J90] Chest pain on breathing [R07.1] Pleural effusion on right [J90] Patient Active Problem List   Diagnosis Date Noted   Pleural effusion 03/16/2023   Hypomagnesemia 03/05/2023   Paroxysmal atrial fibrillation (HCC) 03/04/2023   Paroxysmal atrial flutter (HCC) 03/04/2023   Prolonged Q-T interval on ECG 03/04/2023   Atrial fibrillation and flutter (HCC) 03/03/2023   Pulmonary embolus (HCC) 03/03/2023   Poor appetite 02/24/2023   Medication management 02/24/2023   DNR (do not resuscitate) discussion 02/21/2023   Lactic acidosis 02/18/2023   Malnutrition of moderate degree 02/18/2023   Palliative care encounter 02/18/2023   Goals of care,  counseling/discussion 02/18/2023   Counseling and coordination of care 02/18/2023   Need for emotional support 02/18/2023   Sepsis (HCC) 02/18/2023   Hypophosphatemia 02/16/2023   Thrombocytopenia (HCC) 02/15/2023   Abnormal ultrasound of uterus 02/14/2023   Hypokalemia 02/14/2023    Uncomplicated opioid dependence (HCC) 02/14/2023   Empyema (HCC) 02/13/2023   AKI (acute kidney injury) (HCC) 02/13/2023   Acute metabolic encephalopathy 06/30/2021   Diarrhea 06/30/2021   Hepatic cirrhosis in setting of untreated hepatitis C     Essential hypertension 07/10/2020   Hepatitis C 07/10/2020   Tobacco use 07/10/2020   Macrocytosis 07/10/2020   Hyponatremia 07/10/2020   Hyperglycemia 07/10/2020   Prolonged QT interval 07/10/2020   Sepsis due to Streptococcus intermedius empyema (HCC) 07/10/2020   Cervical radiculopathy 12/04/2014   Chronic pain syndrome due to chronic back pain  08/27/2011   PCP:  Jackie Plum, MD Pharmacy:   [facility] [id number] - [address] [place] - [address] [place] [address] Phone: [phone] Fax: [phone]  [facility] - [place], [place] - [address] [place] [address] Phone: [phone] Fax: [phone]     Social Drivers of Health (SDOH) Social History: SDOH Screenings   Food Insecurity: No Food Insecurity (03/16/2023)  Housing: Low Risk  (03/16/2023)  Transportation Needs: No Transportation Needs (03/16/2023)  Utilities: Not At Risk (03/16/2023)  Social Connections: Unknown (08/01/2021)   Received from [facility]  Tobacco Use: High Risk (03/15/2023)   SDOH Interventions:     Readmission Risk Interventions    02/26/2023   10:09 AM 02/16/2023    3:49 PM 07/13/2020   11:39 AM  Readmission Risk Prevention Plan  Post Dischage Appt   Not Complete  Appt Comments   attempted to call PCP to schedule f/u- however office informed us that patient would need to call for appointment  Medication Screening   Complete  Transportation Screening Complete Not Complete Complete  Transportation Screening Comment  Patient only alert and orieted x1, unable to complete transportation screening.   PCP or Specialist Appt within 5-7 Days  Not Complete    Not Complete comments  Patient only alert and oriented x1 can't schedule an appt.   PCP or Specialist Appt within 3-5 Days Complete    Home Care Screening  Not Complete   Home Care Screening Not Completed Comments  Alert and oriented x1 and not medically ready for HH.   Medication Review (RN CM)  Referral to Pharmacy   HRI or Home Care Consult Complete    Social Work Consult for Recovery Care Planning/Counseling Complete    Palliative Care Screening Complete    Medication Review Oceanographer) Complete

## 2023-03-17 NOTE — [place] FL2 (Signed)
[place] MEDICAID FL2 LEVEL OF CARE FORM     IDENTIFICATION  Patient Name: Anita Price Birthdate: [birth year] Sex: female Admission Date (Current Location): 03/15/2023  [facility] and [place] Number:  Producer, television/film/video and Address:  [facility] [place], [address], [place], [place] [address]      Provider Number: [id number]  Attending Physician Name and Address:  Elgergawy, Leana Roe, MD  Relative Name and Phone Number:       Current Level of Care: Hospital Recommended Level of Care: Skilled Nursing Facility Prior Approval Number:    Date Approved/Denied:   PASRR Number: [id number]  Discharge Plan: SNF    Current Diagnoses: Patient Active Problem List   Diagnosis Date Noted   Pleural effusion 03/16/2023   Hypomagnesemia 03/05/2023   Paroxysmal atrial fibrillation (HCC) 03/04/2023   Paroxysmal atrial flutter (HCC) 03/04/2023   Prolonged Q-T interval on ECG 03/04/2023   Atrial fibrillation and flutter (HCC) 03/03/2023   Pulmonary embolus (HCC) 03/03/2023   Poor appetite 02/24/2023   Medication management 02/24/2023   DNR (do not resuscitate) discussion 02/21/2023   Lactic acidosis 02/18/2023   Malnutrition of moderate degree 02/18/2023   Palliative care encounter 02/18/2023   Goals of care, counseling/discussion 02/18/2023   Counseling and coordination of care 02/18/2023   Need for emotional support 02/18/2023   Sepsis (HCC) 02/18/2023   Hypophosphatemia 02/16/2023   Thrombocytopenia (HCC) 02/15/2023   Abnormal ultrasound of uterus 02/14/2023   Hypokalemia 02/14/2023   Uncomplicated opioid dependence (HCC) 02/14/2023   Empyema (HCC) 02/13/2023   AKI (acute kidney injury) (HCC) 02/13/2023   Acute metabolic encephalopathy 06/30/2021   Diarrhea 06/30/2021   Hepatic cirrhosis in setting of untreated hepatitis C     Essential hypertension 07/10/2020   Hepatitis C 07/10/2020   Tobacco use 07/10/2020   Macrocytosis 07/10/2020    Hyponatremia 07/10/2020   Hyperglycemia 07/10/2020   Prolonged QT interval 07/10/2020   Sepsis due to Streptococcus intermedius empyema (HCC) 07/10/2020   Cervical radiculopathy 12/04/2014   Chronic pain syndrome due to chronic back pain  08/27/2011    Orientation RESPIRATION BLADDER Height & Weight     Self, Time, Situation, Place  Normal Incontinent Weight: 94 lb (42.6 kg) Height:  5\' 5"  (165.1 cm)  BEHAVIORAL SYMPTOMS/MOOD NEUROLOGICAL BOWEL NUTRITION STATUS      Incontinent Diet (See dc summary)  AMBULATORY STATUS COMMUNICATION OF NEEDS Skin   Limited Assist Verbally Other (Comment) (MASD on buttocks)                       Personal Care Assistance Level of Assistance  Bathing, Feeding, Dressing Bathing Assistance: Limited assistance Feeding assistance: Independent Dressing Assistance: Limited assistance     Functional Limitations Info  Sight Sight Info: Impaired        SPECIAL CARE FACTORS FREQUENCY  PT (By licensed PT), OT (By licensed OT)     PT Frequency: 5x/week OT Frequency: 5x/week            Contractures Contractures Info: Not present    Additional Factors Info  Code Status, Allergies Code Status Info: DNR Allergies Info: Celecoxib, Duloxetine Hcl, Tizanidine, Venlafaxine, Effersyllium (Psyllium), Escitalopram, Neurontin (Gabapentin), Tramadol           Current Medications (03/17/2023):  This is the current hospital active medication list Current Facility-Administered Medications  Medication Dose Route Frequency Provider Last Rate Last Admin   acetaminophen (TYLENOL) tablet 650 mg  650 mg Oral Q6H PRN  Dow Adolph N, DO       apixaban Everlene Balls) tablet 5 mg  5 mg Oral BID Dow Adolph N, DO   5 mg at 03/16/23 2157   HYDROmorphone (DILAUDID) injection 0.5 mg  0.5 mg Intravenous Q4H PRN Dow Adolph N, DO   0.5 mg at 03/17/23 0422   melatonin tablet 5 mg  5 mg Oral QHS PRN Dow Adolph N, DO       oxyCODONE (Oxy IR/ROXICODONE) immediate  release tablet 5 mg  5 mg Oral Q6H PRN Dow Adolph N, DO   5 mg at 03/16/23 2157   pantoprazole (PROTONIX) EC tablet 40 mg  40 mg Oral Daily Dow Adolph N, DO   40 mg at 03/16/23 1000   piperacillin-tazobactam (ZOSYN) IVPB 3.375 g  3.375 g Intravenous Q8H Hall, Carole N, DO 12.5 mL/hr at 03/17/23 0506 3.375 g at 03/17/23 0506   pregabalin (LYRICA) capsule 300 mg  300 mg Oral BID Darlin Drop, DO   300 mg at 03/16/23 2157   prochlorperazine (COMPAZINE) injection 5 mg  5 mg Intravenous Q6H PRN Dow Adolph N, DO       propranolol (INDERAL) tablet 40 mg  40 mg Oral TID Elgergawy, Leana Roe, MD       spironolactone (ALDACTONE) tablet 100 mg  100 mg Oral Daily Dow Adolph N, DO   100 mg at 03/16/23 1000     Discharge Medications: Please see discharge summary for a list of discharge medications.  Relevant Imaging Results:  Relevant Lab Results:   Additional Information SSN [id number]  Mearl Latin, LCSW

## 2023-03-17 NOTE — Evaluation (Signed)
Physical Therapy Evaluation Patient Details Name: Anita Price MRN: [id number] DOB: 1955-08-27 Today's Date: 03/17/2023  History of Present Illness  Patient is a 67 yo female presenting to the ED with right-sided pleuritic pain on 03/15/23. Chest x ray showing showed large pleural effusion. Right thoracentesis completed on 12/15 with 950 mL drained. PMH includes: chronic hepatitis C, liver cirrhosis, tobacco abuse, chronic pain syndrome, paroxysmal A-fib noncompliant with Eliquis, cervical radiculopathy status post C3-C6 with anterior cervical discectomy and fusion, hypertension, recently admitted on 02/13/2023 and discharged on 03/06/2023 for sepsis secondary to Streptococcus intermedius empyema   Clinical Impression  Patient received in bed, she is agreeable to minimal movement. Patient requires max A for rolling and supine to sit. She reports pain and generally not feeling well. Patient will continue to benefit from skilled PT to improve functional independence, endurance and strength.          If plan is discharge home, recommend the following: Two people to help with walking and/or transfers;A lot of help with bathing/dressing/bathroom;Assist for transportation;Assistance with cooking/housework;Help with stairs or ramp for entrance   Can travel by private vehicle   No    Equipment Recommendations None recommended by PT  Recommendations for Other Services       Functional Status Assessment Patient has had a recent decline in their functional status and demonstrates the ability to make significant improvements in function in a reasonable and predictable amount of time.     Precautions / Restrictions Precautions Precautions: Fall Precaution Comments: cervical precautions(H/O ACDF), Restrictions Weight Bearing Restrictions Per Provider Order: No      Mobility  Bed Mobility Overal bed mobility: Needs Assistance Bed Mobility: Rolling, Supine to Sit, Sit to Supine Rolling: Max  assist, Used rails   Supine to sit: +2 for physical assistance, +2 for safety/equipment, Mod assist, HOB elevated, Used rails Sit to supine: Mod assist, +2 for physical assistance   General bed mobility comments: limited participation.    Transfers                   General transfer comment: did not attempt, patient unwilling    Ambulation/Gait                  Stairs            Wheelchair Mobility     Tilt Bed    Modified Rankin (Stroke Patients Only)       Balance Overall balance assessment: Needs assistance Sitting-balance support: Feet supported Sitting balance-Leahy Scale: Poor                                       Pertinent Vitals/Pain Pain Assessment Pain Score: 5  Faces Pain Scale: Hurts whole lot Pain Location: lower back Pain Descriptors / Indicators: Discomfort, Grimacing, Guarding Pain Intervention(s): Monitored during session, Repositioned    Home Living Family/patient expects to be discharged to:: Skilled nursing facility Living Arrangements: Children Available Help at Discharge: Family;Available 24 hours/day Type of Home: Apartment Home Access: Level entry       Home Layout: One level Home Equipment: Agricultural consultant (2 wheels)      Prior Function Prior Level of Function : Independent/Modified Independent             Mobility Comments: occasionally used walker ADLs Comments: independent, able to manage finances and medications     Extremity/Trunk Assessment   Upper  Extremity Assessment Upper Extremity Assessment: Defer to OT evaluation    Lower Extremity Assessment Lower Extremity Assessment: Generalized weakness    Cervical / Trunk Assessment Cervical / Trunk Assessment: Normal;Neck Surgery  Communication   Communication Communication: No apparent difficulties Cueing Techniques: Verbal cues;Gestural cues;Tactile cues  Cognition Arousal: Alert Behavior During Therapy: Anxious Overall  Cognitive Status: No family/caregiver present to determine baseline cognitive functioning Area of Impairment: Safety/judgement, Awareness, Problem solving, Following commands                 Orientation Level: Time, Situation Current Attention Level: Focused Memory: Decreased recall of precautions, Decreased short-term memory Following Commands: Follows one step commands with increased time Safety/Judgement: Decreased awareness of safety, Decreased awareness of deficits Awareness: Intellectual Problem Solving: Slow processing, Decreased initiation, Difficulty sequencing, Requires verbal cues, Requires tactile cues          General Comments      Exercises     Assessment/Plan    PT Assessment Patient needs continued PT services  PT Problem List Decreased strength;Decreased activity tolerance;Decreased balance;Decreased mobility;Decreased knowledge of use of DME;Decreased safety awareness;Pain       PT Treatment Interventions DME instruction;Therapeutic activities;Gait training;Therapeutic exercise;Functional mobility training;Patient/family education    PT Goals (Current goals can be found in the Care Plan section)  Acute Rehab PT Goals Patient Stated Goal: none stated PT Goal Formulation: Patient unable to participate in goal setting Time For Goal Achievement: 03/31/23    Frequency Min 1X/week     Co-evaluation               AM-PAC PT "6 Clicks" Mobility  Outcome Measure Help needed turning from your back to your side while in a flat bed without using bedrails?: A Lot Help needed moving from lying on your back to sitting on the side of a flat bed without using bedrails?: A Lot Help needed moving to and from a bed to a chair (including a wheelchair)?: A Lot Help needed standing up from a chair using your arms (e.g., wheelchair or bedside chair)?: A Lot Help needed to walk in hospital room?: Total Help needed climbing 3-5 steps with a railing? : Total 6  Click Score: 10    End of Session   Activity Tolerance: Patient limited by fatigue Patient left: in bed;with call bell/phone within reach;with bed alarm set Nurse Communication: Mobility status PT Visit Diagnosis: Muscle weakness (generalized) (M62.81);Other abnormalities of gait and mobility (R26.89)    Time: 0981-1914 PT Time Calculation (min) (ACUTE ONLY): 10 min   Charges:   PT Evaluation $PT Eval Low Complexity: 1 Low   PT General Charges $$ ACUTE PT VISIT: 1 Visit        Dirk Vanaman, PT, GCS 03/17/23,2:37 PM

## 2023-03-18 DIAGNOSIS — J90 Pleural effusion, not elsewhere classified: Secondary | ICD-10-CM | POA: Diagnosis not present

## 2023-03-18 DIAGNOSIS — R0600 Dyspnea, unspecified: Secondary | ICD-10-CM | POA: Diagnosis not present

## 2023-03-18 DIAGNOSIS — E43 Unspecified severe protein-calorie malnutrition: Secondary | ICD-10-CM | POA: Diagnosis not present

## 2023-03-18 DIAGNOSIS — K7460 Unspecified cirrhosis of liver: Secondary | ICD-10-CM | POA: Diagnosis not present

## 2023-03-18 LAB — COMPREHENSIVE METABOLIC PANEL
ALT: 25 U/L (ref 0–44)
AST: 34 U/L (ref 15–41)
Albumin: 1.9 g/dL — ABNORMAL LOW (ref 3.5–5.0)
Alkaline Phosphatase: 126 U/L (ref 38–126)
Anion gap: 7 (ref 5–15)
BUN: 13 mg/dL (ref 8–23)
CO2: 21 mmol/L — ABNORMAL LOW (ref 22–32)
Calcium: 8 mg/dL — ABNORMAL LOW (ref 8.9–10.3)
Chloride: 106 mmol/L (ref 98–111)
Creatinine, Ser: 0.58 mg/dL (ref 0.44–1.00)
GFR, Estimated: >60 mL/min (ref >60)
Glucose, Bld: 84 mg/dL (ref 70–99)
Potassium: 3.9 mmol/L (ref 3.5–5.1)
Sodium: 134 mmol/L — ABNORMAL LOW (ref 135–145)
Total Bilirubin: 0.7 mg/dL (ref <1.2)
Total Protein: 6.2 g/dL — ABNORMAL LOW (ref 6.5–8.1)

## 2023-03-18 LAB — CBC
HCT: 34.7 % — ABNORMAL LOW (ref 36.0–46.0)
Hemoglobin: 11.2 g/dL — ABNORMAL LOW (ref 12.0–15.0)
MCH: 30.4 pg (ref 26.0–34.0)
MCHC: 32.3 g/dL (ref 30.0–36.0)
MCV: 94 fL (ref 80.0–100.0)
Platelets: 191 10*3/uL (ref 150–400)
RBC: 3.69 MIL/uL — ABNORMAL LOW (ref 3.87–5.11)
RDW: 16.8 % — ABNORMAL HIGH (ref 11.5–15.5)
WBC: 8.3 10*3/uL (ref 4.0–10.5)
nRBC: 0 % (ref 0.0–0.2)

## 2023-03-18 LAB — AMMONIA: Ammonia: 41 umol/L — ABNORMAL HIGH (ref 9–35)

## 2023-03-18 MED ORDER — LACTULOSE 20 GM/30ML PO SOLN: 30.0000 mL | Freq: Two times a day (BID) | ORAL | Status: DC | PRN

## 2023-03-18 MED ORDER — AMOXICILLIN-POT CLAVULANATE 875-125 MG PO TABS: 1.0000 | ORAL_TABLET | Freq: Two times a day (BID) | ORAL | Status: AC

## 2023-03-18 MED ORDER — AMOXICILLIN-POT CLAVULANATE 875-125 MG PO TABS: 1.0000 | ORAL_TABLET | Freq: Two times a day (BID) | ORAL | Status: DC

## 2023-03-18 MED ORDER — OXYCODONE HCL 5 MG PO TABS: 2.5000 mg | ORAL_TABLET | Freq: Four times a day (QID) | ORAL | 0 refills | Status: AC | PRN

## 2023-03-18 NOTE — Discharge Instructions (Signed)
Follow with Primary MD Jackie Plum, MD / SNF physician  Get CBC, CMP, 2 view chest x-ray in 1 to 2 weeks .   Activity: As tolerated with Full fall precautions use walker/cane & assistance as needed   Disposition SNF   Diet: 2 g sodium diet   On your next visit with your primary care physician please Get Medicines reviewed and adjusted.   Please request your Prim.MD to go over all Hospital Tests and Procedure/Radiological results at the follow up, please get all Hospital records sent to your Prim MD by signing hospital release before you go home.   If you experience worsening of your admission symptoms, develop shortness of breath, life threatening emergency, suicidal or homicidal thoughts you must seek medical attention immediately by calling 911 or calling your MD immediately  if symptoms less severe.  You Must read complete instructions/literature along with all the possible adverse reactions/side effects for all the Medicines you take and that have been prescribed to you. Take any new Medicines after you have completely understood and accpet all the possible adverse reactions/side effects.   Do not drive, operating heavy machinery, perform activities at heights, swimming or participation in water activities or provide baby sitting services if your were admitted for syncope or siezures until you have seen by Primary MD or a Neurologist and advised to do so again.  Do not drive when taking Pain medications.    Do not take more than prescribed Pain, Sleep and Anxiety Medications  Special Instructions: If you have smoked or chewed Tobacco  in the last 2 yrs please stop smoking, stop any regular Alcohol  and or any Recreational drug use.  Wear Seat belts while driving.   Please note  You were cared for by a hospitalist during your hospital stay. If you have any questions about your discharge medications or the care you received while you were in the hospital after you are  discharged, you can call the unit and asked to speak with the hospitalist on call if the hospitalist that took care of you is not available. Once you are discharged, your primary care physician will handle any further medical issues. Please note that NO REFILLS for any discharge medications will be authorized once you are discharged, as it is imperative that you return to your primary care physician (or establish a relationship with a primary care physician if you do not have one) for your aftercare needs so that they can reassess your need for medications and monitor your lab values.

## 2023-03-18 NOTE — TOC Transition Note (Signed)
Transition of Care [facility]) - Discharge Note   Patient Details  Name: Anita Price MRN: [id number] Date of Birth: 06-20-1955  [facility]) CM/SW Contact:  Mearl Latin, LCSW Phone Number: 03/18/2023, 3:27 PM   Clinical Narrative:    Patient will DC to: [facility] Anticipated DC date: 03/18/23 Family notified: Daughter, Pensions consultant by: Sharin Mons   Per MD patient ready for DC to [facility]. RN to call report prior to discharge [phone]). RN, patient, patient's family, and facility notified of DC. Discharge Summary and FL2 sent to facility. DC packet on chart including signed script. Ambulance transport requested for patient.   CSW will sign off for now as social work intervention is no longer needed. Please consult [place] again if new needs arise.       Barriers to Discharge: Barriers Resolved   Patient Goals and CMS Choice Patient states their goals for this hospitalization and ongoing recovery are:: Rehab CMS Medicare.gov Compare Post Acute Care list provided to:: Patient Choice offered to / list presented to : Patient [place] ownership interest in [facility].provided to:: Patient    Discharge Placement   Existing PASRR number confirmed : 03/18/23          Patient chooses bed at:  [facility]) Patient to be transferred to facility by: [facility] Name of family member notified: Melissa Patient and family notified of of transfer: 03/18/23  Discharge Plan and Services Additional resources added to the After Visit Summary for   In-house Referral: Clinical Social Work   Post Acute Care Choice: Skilled Nursing Facility                               Social Drivers of Health (SDOH) Interventions SDOH Screenings   Food Insecurity: No Food Insecurity (03/16/2023)  Housing: Low Risk  (03/16/2023)  Transportation Needs: No Transportation Needs (03/16/2023)  Utilities: Not At Risk (03/16/2023)  Social Connections: Unknown  (08/01/2021)   Received from [facility]  Tobacco Use: High Risk (03/15/2023)     Readmission Risk Interventions    02/26/2023   10:09 AM 02/16/2023    3:49 PM 07/13/2020   11:39 AM  Readmission Risk Prevention Plan  Post Dischage Appt   Not Complete  Appt Comments   attempted to call PCP to schedule f/u- however office informed us that patient would need to call for appointment  Medication Screening   Complete  Transportation Screening Complete Not Complete Complete  Transportation Screening Comment  Patient only alert and orieted x1, unable to complete transportation screening.   PCP or Specialist Appt within 5-7 Days  Not Complete   Not Complete comments  Patient only alert and oriented x1 can't schedule an appt.   PCP or Specialist Appt within 3-5 Days Complete    Home Care Screening  Not Complete   Home Care Screening Not Completed Comments  Alert and oriented x1 and not medically ready for HH.   Medication Review (RN CM)  Referral to Pharmacy   HRI or Home Care Consult Complete    Social Work Consult for Recovery Care Planning/Counseling Complete    Palliative Care Screening Complete    Medication Review Oceanographer) Complete

## 2023-03-18 NOTE — Discharge Summary (Addendum)
Physician Discharge Summary  Anita Price ZOX:[id number] DOB: Aug 09, 1955 DOA: 03/15/2023  PCP: Jackie Plum, MD  Admit date: 03/15/2023 Discharge date: 03/18/2023  Admitted From: (SNF) Disposition:  (SNF )  Recommendations for Outpatient Follow-up:   Will need referral set up to [facility] for management of cirrhosis and treatment of hep C  Check CBC, CMP in 3 days She will need repeat two-view chest x-ray to ensure no recurrence of her pleural effusion 1 to 2 weeks  Discharge Condition: (Stable) CODE STATUS: (DNR) was confirmed by the patient Diet recommendation: 2 g sodium diet  Brief/Interim Summary:  Anita Price is a 67 y.o. female with medical history significant for chronic hepatitis C, liver cirrhosis, tobacco abuse, chronic pain syndrome, paroxysmal A-fib noncompliant with Eliquis, cervical radiculopathy status post C3-C6 with anterior cervical discectomy and fusion, hypertension, recently admitted on 02/13/2023 and discharged on 03/06/2023 for sepsis secondary to Streptococcus intermedius empyema, who returns to the ED due to worsening right-sided pleuritic pain for the past few days.    In the ED, chest x-ray showed moderate right pleural effusion worse since prior examination.  Irregular opacity within the left midlung zone with possible area of cavitation, possibly infectious, in the acute setting.  Subsequently, the patient had a CT chest with contrast.     Started on empiric IV antibiotics.  She was admitted in detail hospitalist service,   CT chest showed large pleural effusion. 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  And she may need TIPS if continues to recur.so GI were consulted, where they felt this is most likely due to to her infected pleural effusion initially-empyema, as bilateral hydrothorax is quite rare, and left-sided only hepatic hydrothorax also rare, and with her elevated neutrophils, and lactic acid, it was felt unlikely related to hepatic hydrothorax . -She was treated empirically with IV Zosyn during hospital stay, pleural effusion cultures remain negative, she will be discharged on 10 days of oral Augmentin, will need Pete chest x-ray in couple weeks to ensure no recurrence of pleural effusion.  Liver cirrhosis cirrhosis, with known hep C -She remains on Aldactone, propranolol -No encephalopathy, within normal limit, she is not on lactulose -Reports she was not treated for her hep C, obtain HCVRNA quantitative-genotype, GI input greatly appreciated, recommendation to continue with Aldactone, propranolol, 2 g sodium diet, and she will need referral set up to [facility] for management of cirrhosis and treatment of hep C    Paroxysmal A-fib on Eliquis, rate controlled Continue with Eliquis and propranolol  PE and small splenic infarct during recent admission -continues on Eliquis    Severe protein calorie malnutrition BMI 15 Albumin 1.8 Encourage increase in protein calorie intake as tolerated Dietitian consulted   Anemia of chronic disease Hemoglobin stable   Physical debility Severe deconditioning PT OT assessment Fall precautions For SNF-subacute rehab placement      Discharge Diagnoses:  Principal Problem:   Pleural effusion Active Problems:   Chronic pain syndrome due to chronic back pain    Hepatic cirrhosis in setting of untreated hepatitis C    DNR (do not resuscitate) discussion  Protein-calorie malnutrition, severe    Discharge Instructions  Discharge  Instructions     Diet - low sodium heart healthy   Complete by: As directed    Discharge instructions   Complete by: As directed    Follow with Primary MD Jackie Plum, MD / SNF physician  Get CBC, CMP, 2 view chest x-ray in 1 to 2 weeks .   Activity: As tolerated with Full fall precautions use walker/cane & assistance as needed   Disposition SNF   Diet: 2 g sodium diet   On your next visit with your primary care physician please Get Medicines reviewed and adjusted.   Please request your Prim.MD to go over all Hospital Tests and Procedure/Radiological results at the follow up, please get all Hospital records sent to your Prim MD by signing hospital release before you go home.   If you experience worsening of your admission symptoms, develop shortness of breath, life threatening emergency, suicidal or homicidal thoughts you must seek medical attention immediately by calling 911 or calling your MD immediately  if symptoms less severe.  You Must read complete instructions/literature along with all the possible adverse reactions/side effects for all the Medicines you take and that have been prescribed to you. Take any new Medicines after you have completely understood and accpet all the possible adverse reactions/side effects.   Do not drive, operating heavy machinery, perform activities at heights, swimming or participation in water activities or provide baby sitting services if your were admitted for syncope or siezures until you have seen by Primary MD or a Neurologist and advised to do so again.  Do not drive when taking Pain medications.    Do not take more than prescribed Pain, Sleep and Anxiety Medications  Special Instructions: If you have smoked or chewed Tobacco  in the last 2 yrs please stop smoking, stop any regular Alcohol  and or any Recreational drug use.  Wear Seat belts while driving.   Please note  You were cared for by a hospitalist during your  hospital stay. If you have any questions about your discharge medications or the care you received while you were in the hospital after you are discharged, you can call the unit and asked to speak with the hospitalist on call if the hospitalist that took care of you is not available. Once you are discharged, your primary care physician will handle any further medical issues. Please note that NO REFILLS for any discharge medications will be authorized once you are discharged, as it is imperative that you return to your primary care physician (or establish a relationship with a primary care physician if you do not have one) for your aftercare needs so that they can reassess your need for medications and monitor your lab values.   Increase activity slowly   Complete by: As directed    No wound care   Complete by: As directed       Allergies as of 03/18/2023       Reactions   Celecoxib Other (See Comments)   Insomnia   Duloxetine Hcl Other (See Comments)   Insomnia   Tizanidine Nausea And Vomiting   Venlafaxine Other (See Comments)   Hallucinations   Effersyllium [psyllium] Other (See Comments)   Made the patient "feel badly"   Escitalopram Other (See Comments)   insomnia   Neurontin [gabapentin] Other (See Comments)   Made the patient "feel badly"   Tramadol Other (See Comments)   Made the patient "feel badly"  Medication List     STOP taking these medications    Morphine Sulfate ER 15 MG Tbea       TAKE these medications    amoxicillin-clavulanate 875-125 MG tablet Commonly known as: AUGMENTIN Take 1 tablet by mouth every 12 (twelve) hours for 10 days.   apixaban 5 MG Tabs tablet Commonly known as: ELIQUIS Take 1 tablet (5 mg total) by mouth 2 (two) times daily.   diclofenac Sodium 1 % Gel Commonly known as: VOLTAREN Apply 2 g topically 4 (four) times daily.   Lactulose 20 GM/30ML Soln Take 30 mLs (20 g total) by mouth 2 (two) times daily as needed (Please  give if no 1-2 BMs per day).   oxyCODONE 5 MG immediate release tablet Commonly known as: Oxy IR/ROXICODONE Take 0.5 tablets (2.5 mg total) by mouth every 6 (six) hours as needed for moderate pain (pain score 4-6) or breakthrough pain.   pantoprazole 40 MG tablet Commonly known as: PROTONIX Take 1 tablet (40 mg total) by mouth daily.   pregabalin 300 MG capsule Commonly known as: LYRICA Take 300 mg by mouth in the morning and at bedtime.   propranolol 40 MG tablet Commonly known as: INDERAL Take 1 tablet (40 mg total) by mouth 3 (three) times daily.   spironolactone 100 MG tablet Commonly known as: ALDACTONE Take 1 tablet (100 mg total) by mouth daily.        Allergies  Allergen Reactions   Celecoxib Other (See Comments)    Insomnia   Duloxetine Hcl Other (See Comments)    Insomnia   Tizanidine Nausea And Vomiting   Venlafaxine Other (See Comments)    Hallucinations   Effersyllium [Psyllium] Other (See Comments)    Made the patient "feel badly"   Escitalopram Other (See Comments)    insomnia   Neurontin [Gabapentin] Other (See Comments)    Made the patient "feel badly"   Tramadol Other (See Comments)    Made the patient "feel badly"    Consultations:  PCCM Gastroenterology  Procedures/Studies: US THORACENTESIS ASP PLEURAL SPACE W/IMG GUIDE Result Date: 03/16/2023 INDICATION: Hepatitis-C and cirrhosis. History of left empyema requiring chest tube. Now with right pleural effusion. Request for diagnostic and therapeutic thoracentesis. EXAM: ULTRASOUND GUIDED RIGHT THORACENTESIS MEDICATIONS: 1% lidocaine 10 mL COMPLICATIONS: None immediate. PROCEDURE: An ultrasound guided thoracentesis was thoroughly discussed with the patient and questions answered. The benefits, risks, alternatives and complications were also discussed. The patient understands and wishes to proceed with the procedure. Written consent was obtained. Ultrasound was performed to localize and mark an  adequate pocket of fluid in the right chest. The area was then prepped and draped in the normal sterile fashion. 1% Lidocaine was used for local anesthesia. Under ultrasound guidance a 6 Fr Safe-T-Centesis catheter was introduced. Thoracentesis was performed. The catheter was removed and a dressing applied. FINDINGS: A total of approximately 950 mL of hazy amber fluid was removed. Samples were sent to the laboratory as requested by the clinical team. IMPRESSION: Successful ultrasound guided right thoracentesis yielding 950 mL of pleural fluid. No pneumothorax on post-procedure chest x-ray. Procedure performed by: Corrin Parker, PA-C Electronically Signed   By: Corlis Leak M.D.   On: 03/16/2023 11:49   DG Chest 1 View Result Date: 03/16/2023 CLINICAL DATA:  [id number] Pleural effusion on right [id number] EXAM: CHEST  1 VIEW COMPARISON:  Chest radiograph from one day prior. FINDINGS: Partially visualized surgical hardware from ACDF. Stable cardiomediastinal silhouette with normal heart size. No pneumothorax. Trace  right pleural effusion, significantly decreased. Stable trace left pleural effusion. Mild patchy left parahilar lung opacities, unchanged. Improved right lung base aeration with residual mild right lung base atelectasis. IMPRESSION: 1. Trace right pleural effusion, significantly decreased. No pneumothorax. 2. Improved right lung base aeration with residual mild right lung base atelectasis. 3. Stable trace left pleural effusion. 4. Unchanged mild patchy left parahilar lung opacities, compatible with combination of trapped left pleural fluid along major fissure and irregular pulmonary nodule please see chest CT from earlier today for details and follow-up recommendations. Electronically Signed   By: Delbert Phenix M.D.   On: 03/16/2023 10:58   CT Chest W Contrast Result Date: 03/16/2023 CLINICAL DATA:  Irregular opacity or cavitary nodule in the left lung on AP and lateral chest today. EXAM: CT CHEST WITH CONTRAST  TECHNIQUE: Multidetector CT imaging of the chest was performed during intravenous contrast administration. RADIATION DOSE REDUCTION: This exam was performed according to the departmental dose-optimization program which includes automated exposure control, adjustment of the mA and/or kV according to patient size and/or use of iterative reconstruction technique. CONTRAST:  50mL OMNIPAQUE IOHEXOL 350 MG/ML SOLN COMPARISON:  AP Lat chest yesterday, portable chest 03/03/2023, portable chest 02/23/2023, CTA chest 02/21/2023 and chest CT without contrast 02/17/2023. FINDINGS: Cardiovascular: The cardiac size is normal. A small pericardial effusion has developed anteriorly since the last CT. The CTA chest demonstrated right lower lobe segmental and subsegmental arterial emboli but this is not re-evaluated for on the current exam. Pulmonary arteries are normal in caliber and are at least centrally clear. The pulmonary veins are normal in caliber. The aorta and great vessels are normal and there are no visible coronary calcifications. 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 Mediastinum/Nodes: There is a heterogeneous 1.7 cm nodule in the left lobe of the thyroid gland in the inferior pole. Recommend nonemergent follow-up ultrasound. There are subcentimeter hypodense nodule higher up in the left lobe. The right thyroid lobe is unremarkable. Axillary spaces are clear. There are no enlarged intrathoracic lymph nodes. No esophageal thickening, but there are distal esophageal varices. The trachea and main bronchi are patent. Lungs/Pleura: Left-sided pigtail chest tube has been removed since the last CT. There previously was a small layering left pleural effusion with scattered loculated fluid collections within the oblique fissure. There currently is a minimal left pleural effusion in the posterior chest base, and scattered small fluid pockets within the fissure, largest of these is  anterior to the superior segment of the lower lobe measuring 2.6 cm on 3:89. There is subsegmental atelectatic change in the posterior left lower lobe. A 2 cm coarsely nodular opacity is newly noted in the left upper lobe laterally along the fissure, probably is nodular atelectasis or small infiltrate. The lungs are mildly emphysematous biapical pleural-parenchymal scarring. A moderate-to-large sized right pleural effusion has developed since previous CT with compressive collapse of the right lower lobe, compressive atelectasis along the posterior right upper lobe. Some of this is probably loculated subpulmonic area. Underlying pneumonia would be difficult to exclude in the right lower lobe. The remaining lungs are clear.  No impacted bronchi are seen. Upper Abdomen: Cirrhotic liver with left upper quadrant varices. Fluid-filled cleft in the spleen again extends from the posterior capsule into the hilum consistent with a subacute laceration and was seen previously. 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 No increased perisplenic fluid is evident. There is minimal fluid adjacent to the spleen. Musculoskeletal: No acute  or significant osseous findings. No chest wall mass. IMPRESSION: 1. Interval removal of left-sided pigtail chest tube with minimal left pleural effusion in the posterior chest base, and scattered small fluid pockets within the left oblique fissure. 2. 2 cm new coarsely nodular opacity in the left upper lobe laterally along the fissure, probably nodular atelectasis or a small infiltrate. 2-68-month follow-up study recommended. 3. Moderate-to-large sized right pleural effusion with compressive collapse of the right lower lobe, compressive atelectasis along the posterior right upper lobe. Some of this is probably loculated in the subpulmonic area. Underlying pneumonia would be difficult to exclude in the right lower lobe. 4. Small pericardial effusion has developed anteriorly since the last  CT. 5. Emphysema. 6. Cirrhosis with left upper quadrant varices. 7. Subacute laceration of the spleen with again noted small infarct in the anterior spleen. No increased perisplenic fluid is evident. 8. 1.7 cm heterogeneous nodule in the left lobe of the thyroid gland. Recommend nonemergent follow-up ultrasound. 9. Aortic atherosclerosis. Aortic Atherosclerosis (ICD10-I70.0) and Emphysema (ICD10-J43.9). Electronically Signed   By: Almira Bar M.D.   On: 03/16/2023 02:08   DG Chest 2 View Result Date: 03/15/2023 CLINICAL DATA:  Chest pain EXAM: CHEST - 2 VIEW COMPARISON:  03/03/2023 FINDINGS: The lungs are symmetrically well expanded. Moderate right pleural effusion has enlarged since prior examination with associated right basilar compressive atelectasis. Small left pleural effusion is present, decreased in size since prior examination. Ovoid opacity within the left mid lung zone is again identified which may correspond to loculated fluid within the fissure noted on CT examination of 02/21/2023. Irregular opacity has developed within the left mid lung zone with possible areas of cavitation, possibly infectious in the acute setting. No pneumothorax. Cardiac size within normal limits. Pulmonary vascularity is normal. No acute bone abnormality. IMPRESSION: 1. Moderate right pleural effusion, enlarged since prior examination. 2. Small left pleural effusion, decreased in size since prior examination. 3. Irregular opacity within the left mid lung zone with possible areas of cavitation, possibly infectious in the acute setting. This could be confirmed with CT examination. Electronically Signed   By: Helyn Numbers M.D.   On: 03/15/2023 20:47   DG Chest Port 1 View Result Date: 03/03/2023 CLINICAL DATA:  Shortness of breath. EXAM: PORTABLE CHEST 1 VIEW COMPARISON:  Chest radiograph dated 02/22/2013 FINDINGS: Moderate bilateral pleural effusions, new on the right since the prior radiograph. There is associated  partial compressive atelectasis of the lower lobes versus pneumonia. Loculated fluid in the left fissure again noted but less conspicuous compared to prior radiograph. No pneumothorax. Stable cardiac silhouette. No acute osseous pathology. IMPRESSION: Moderate bilateral pleural effusions with associated partial compressive atelectasis versus pneumonia. Electronically Signed   By: Elgie Collard M.D.   On: 03/03/2023 15:38   DG CHEST PORT 1 VIEW Result Date: 02/23/2023 CLINICAL DATA:  Chest tube removal. EXAM: PORTABLE CHEST 1 VIEW COMPARISON:  02/21/2023 FINDINGS: Left pleural drain has been pulled in the interval. There is persistent left pleural fluid with some probable loculation. Similar retrocardiac left base collapse/consolidation. No evidence for pneumothorax. Right lung clear. 2 catheters are superimposed on the mediastinum NG junction, presumably reflecting superimposition of the internal and external components of the patient's feeding tube. IMPRESSION: Interval removal of left pleural drain with persistent left pleural fluid and retrocardiac left base collapse/consolidation. No evidence for pneumothorax. Electronically Signed   By: Kennith Center M.D.   On: 02/23/2023 12:24   US Paracentesis Result Date: 02/22/2023 INDICATION: Patient with history of hepatitis-C,  cirrhosis, left chest empyema, pulmonary emboli, ascites. Request received for diagnostic and therapeutic paracentesis. EXAM: ULTRASOUND GUIDED DIAGNOSTIC AND THERAPEUTIC PARACENTESIS MEDICATIONS: 8 mL 1% lidocaine COMPLICATIONS: None immediate. PROCEDURE: Informed written consent was obtained from the patient after a discussion of the risks, benefits and alternatives to treatment. A timeout was performed prior to the initiation of the procedure. Initial ultrasound scanning demonstrates a small to moderate amount of ascites within the left lower abdominal quadrant. The left lower abdomen was prepped and draped in the usual sterile  fashion. 1% lidocaine was used for local anesthesia. Following this, a 19 gauge, 7-cm, Yueh catheter was introduced. An ultrasound image was saved for documentation purposes. The paracentesis was performed. The catheter was removed and a dressing was applied. The patient tolerated the procedure well without immediate post procedural complication. FINDINGS: A total of approximately 450 cc of light yellow fluid was removed. Samples were sent to the laboratory as requested by the clinical team. IMPRESSION: Successful ultrasound-guided diagnostic and therapeutic paracentesis yielding 450 cc of peritoneal fluid. Due to close proximity of bowel loops and somewhat loculated nature of ascites only the above amount of fluid could be removed today. Performed by: Artemio Aly Electronically Signed   By: Marliss Coots M.D.   On: 02/22/2023 13:35   CT Angio Chest Pulmonary Embolism (PE) W or WO Contrast Addendum Date: 02/21/2023 ADDENDUM REPORT: 02/21/2023 21:18 ADDENDUM: Critical Value/emergent results were called by telephone at the time of interpretation on 02/21/2023 at 9:17 pm to provider Dr. Pasty Arch, who verbally acknowledged these results. Electronically Signed   By: Thornell Sartorius M.D.   On: 02/21/2023 21:18   Result Date: 02/21/2023 CLINICAL DATA:  Pulmonary embolism suspected, low to intermediate probability, negative D-dimer. Rule out pulmonary embolism. EXAM: CT ANGIOGRAPHY CHEST WITH CONTRAST TECHNIQUE: Multidetector CT imaging of the chest was performed using the standard protocol during bolus administration of intravenous contrast. Multiplanar CT image reconstructions and MIPs were obtained to evaluate the vascular anatomy. RADIATION DOSE REDUCTION: This exam was performed according to the departmental dose-optimization program which includes automated exposure control, adjustment of the mA and/or kV according to patient size and/or use of iterative reconstruction technique. CONTRAST:  75mL OMNIPAQUE  IOHEXOL 350 MG/ML SOLN COMPARISON:  02/20/2021. FINDINGS: Cardiovascular: The heart is normal in size and there is no pericardial effusion. There is atherosclerotic calcification of the aorta without evidence of aneurysm. The pulmonary trunk is normal in caliber. Segmental and subsegmental pulmonary artery filling defects are present in the right lower lobe. There is no evidence of right heart strain. Mediastinum/Nodes: No mediastinal, hilar, or axillary lymphadenopathy. The thyroid gland and trachea are within normal limits. An enteric tube is seen in the esophagus. Lungs/Pleura: Biapical pleural thickening is present bilaterally. There is a loculated pleural effusion on the left with a associated pleural thickening. A left-sided chest tube is in place. Consolidation is noted in the left lower lobe. There is a small right pleural effusion. With patchy airspace disease at the right lung base. No pneumothorax is seen. Upper Abdomen: The liver has a nodular contour, compatible with underlying cirrhosis. Stable linear hypodensity is noted in the spleen, possible infarcts or laceration and unchanged from the prior exam. Moderate ascites is noted in the upper abdomen. Musculoskeletal: Cervical spinal fusion hardware is present. No acute osseous abnormality is seen. Review of the MIP images confirms the above findings. IMPRESSION: 1. Right lower lobe segmental and subsegmental pulmonary emboli. No evidence of right heart strain. 2. Stable loculated pleural effusion  on the left with pleural thickening and chest tube in place and left lower lobe atelectasis or infiltrate. 3. Small right pleural effusion with patchy airspace disease at the right lung base, unchanged. 4. Cirrhosis. 5. Linear hypodensity in the spleen, possible infarct or laceration, unchanged. 6. Moderate ascites in the upper abdomen. Electronically Signed: By: Thornell Sartorius M.D. On: 02/21/2023 20:28   DG CHEST PORT 1 VIEW Result Date: 02/21/2023 CLINICAL  DATA:  Chest tube EXAM: PORTABLE CHEST 1 VIEW COMPARISON:  02/19/2023 FINDINGS: Right lung clear. The more inferior of the 2 left-sided pigtail drainage catheters has been removed in the interval. The more cranial of the 2 persists and appear stable. Pleural gas in the left hemithorax noted on the previous study is no longer evident with persistent left pleural fluid collection. A feeding tube passes into the stomach although the distal tip position is not included on the film. IMPRESSION: 1. Interval removal of the more inferior of the 2 left-sided pigtail drainage catheters. 2. Persistent left pleural fluid collection with interval resolution of the left-sided pleural gas seen previously. Electronically Signed   By: Kennith Center M.D.   On: 02/21/2023 16:07   CT ABDOMEN PELVIS W CONTRAST Result Date: 02/21/2023 CLINICAL DATA:  Abdominal pain, cirrhosis and possible splenic infarcts by prior CT. EXAM: CT ABDOMEN AND PELVIS WITH CONTRAST TECHNIQUE: Multidetector CT imaging of the abdomen and pelvis was performed using the standard protocol following bolus administration of intravenous contrast. RADIATION DOSE REDUCTION: This exam was performed according to the departmental dose-optimization program which includes automated exposure control, adjustment of the mA and/or kV according to patient size and/or use of iterative reconstruction technique. CONTRAST:  OMNIPAQUE IOHEXOL 300 MG/ML  SOLN COMPARISON:  Prior CT of the abdomen and pelvis on 02/13/2023 FINDINGS: Lower chest: Chest findings were described on a CT of the chest earlier today. Additional finding after administration of contrast potential small distal subsegmental pulmonary embolism in a posterior basilar right lower lobe pulmonary artery branch. This is not very well delineated on the abdominal CT. Hepatobiliary: Cirrhosis of the liver. No solid mass. The gallbladder is unremarkable. No biliary ductal dilatation. Pancreas: Unremarkable. No  pancreatic ductal dilatation or surrounding inflammatory changes. Spleen: Anterior small perfusion abnormality of the spleen again noted which may represent involving infarct. The perfusion defect involving the superior and mid spleen extends from the anterior to posterior capsular surface and although this may also represent an infarct is somewhat atypical in shape for an infarct and could represent a laceration. This also appears stable since the prior contrast enhanced CT and is not associated with significant perisplenic hemorrhage or any contrast extravasation. Adrenals/Urinary Tract: Adrenal glands are unremarkable. Kidneys are normal, without renal calculi, focal lesion, or hydronephrosis. Bladder is decompressed by a Foley catheter. Stomach/Bowel: No bowel obstruction, significant ileus or free intraperitoneal air. A feeding tube extends into the second portion of the duodenum. Rectal tube present. Vascular/Lymphatic: Mild atherosclerosis of the abdominal aorta without aneurysm. Gastro renal shunt present as well as distal esophageal varices. No portal vein thrombus. The splenic vein is patent. No lymphadenopathy identified. Reproductive: Uterus and bilateral adnexa are unremarkable. Other: Mild to moderate volume ascites throughout the peritoneal cavity. Body wall anasarca. Small right inguinal hernia containing fat. Musculoskeletal: Status post prior posterior lumbar fusion at the L4-5 level. IMPRESSION: 1. 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 2. Mild to moderate volume ascites throughout the peritoneal cavity. 3.  Body wall anasarca. 4. Anterior small perfusion abnormality of the spleen again noted which may represent involving infarct. The perfusion defect involving the superior and mid spleen extends from the anterior to posterior capsular surface and although this may also represent an infarct is somewhat atypical in  shape for an infarct and could represent a laceration. This also appears stable since the prior contrast enhanced CT and is not associated with significant perisplenic hemorrhage or any contrast extravasation. 5. Chest findings were described on a CT of the chest earlier today. Additional finding after administration of contrast at the visualized lung bases of a potential distal subsegmental pulmonary embolism in a posterior basilar right lower lobe pulmonary artery branch. This is not very well delineated on the abdominal CT. Consider follow-up CTA of the chest with contrast. 6. Aortic atherosclerosis. Electronically Signed   By: Irish Lack M.D.   On: 02/21/2023 12:06   CT CHEST WO CONTRAST Result Date: 02/21/2023 CLINICAL DATA:  Status post staged placement of 2 separate percutaneous left-sided thoracostomy tube is to treat empyema. EXAM: CT CHEST WITHOUT CONTRAST TECHNIQUE: Multidetector CT imaging of the chest was performed following the standard protocol without IV contrast. RADIATION DOSE REDUCTION: This exam was performed according to the departmental dose-optimization program which includes automated exposure control, adjustment of the mA and/or kV according to patient size and/or use of iterative reconstruction technique. COMPARISON:  02/17/2023 FINDINGS: Cardiovascular: The heart size is normal. No pericardial fluid. Normal caliber thoracic aorta. Normal caliber central pulmonary arteries. No significant calcified coronary artery plaque. Mediastinum/Nodes: No enlarged mediastinal or axillary lymph nodes. Thyroid gland, trachea, and esophagus demonstrate no significant findings. Feeding tube present in the esophagus and extends into the stomach. Lungs/Pleura: Substantial improvement in appearance left empyema after additional catheter drainage on 02/17/2023. The lateral component of fluid is now evacuated completely. The only residual pleural fluid collections are 2 separate small loculations  within the major fissure with superior loculation measuring roughly 2.8 x 3.9 cm on axial images and more inferior loculation measuring roughly 2.4 x 3.1 cm. Minimal residual basilar pleural fluid with associated residual left lower lobe atelectasis/consolidation. No pneumothorax. Airspace disease in the posterior and inferior right lower lobe appears stable since the prior CT. Upper Abdomen: Stable appearance of cirrhosis and upper abdominal ascites. Musculoskeletal: No chest wall mass or suspicious bone lesions identified. IMPRESSION: 1. Substantial improvement in appearance of left empyema after additional catheter drainage on 02/17/2023. The lateral component of fluid is now evacuated completely. The only residual pleural fluid collections are 2 separate small loculations within the major fissure with superior loculation measuring roughly 2.8 x 3.9 cm on axial images and more inferior loculation measuring roughly 2.4 x 3.1 cm. 2. Minimal residual basilar pleural fluid with associated residual left lower lobe atelectasis/consolidation. 3. Stable right lower lobe airspace disease. 4. Stable appearance of cirrhosis and upper abdominal ascites. Electronically Signed   By: Irish Lack M.D.   On: 02/21/2023 11:50   DG CHEST PORT 1 VIEW Result Date: 02/19/2023 CLINICAL DATA:  Pleural effusion. EXAM: PORTABLE CHEST 1 VIEW COMPARISON:  X-ray 02/18/2023 and older. FINDINGS: Enteric tube in place. Persistent left-sided 2 pigtail pleural catheters with the improving pneumothorax. Trace residual laterally. There is also small residual left pleural effusion but improving. Persistent left retrocardiac opacity. Hyperinflation. No right-sided pneumothorax. No edema. Stable cardiopericardial silhouette. Overlapping cardiac leads. IMPRESSION: Interval improvement of the left-sided hydropneumothorax with some residual. Electronically Signed   By: Karen Kays M.D.   On: 02/19/2023  12:07   DG Abd 1 View Result Date:  02/18/2023 CLINICAL DATA:  Feeding tube placement. EXAM: ABDOMEN - 1 VIEW COMPARISON:  None Available. FINDINGS: Tip of the weighted enteric tube in the right upper quadrant in the region of the distal stomach or proximal duodenum. Drainage catheter in the left upper quadrant/lung base. Small amount of air in the adjacent soft tissues. IMPRESSION: Tip of the weighted enteric tube in the right upper quadrant in the region of the distal stomach or proximal duodenum. Electronically Signed   By: Narda Rutherford M.D.   On: 02/18/2023 18:07   DG Chest Port 1 View Result Date: 02/18/2023 CLINICAL DATA:  Chest tube. EXAM: PORTABLE CHEST 1 VIEW COMPARISON:  February 17, 2023. FINDINGS: There is been interval placement of another left-sided chest tube more superiorly. Pleural effusion is significantly smaller, although some degree of pneumothorax is now seen. IMPRESSION: Interval placement of another left-sided chest tube with decreased left pleural effusion, but small pneumothorax now seen. Electronically Signed   By: Lupita Raider M.D.   On: 02/18/2023 07:39   CT CHEST WO CONTRAST Result Date: 02/17/2023 CLINICAL DATA:  LEFT chest empyema EXAM: CT CHEST WITHOUT CONTRAST TECHNIQUE: Multidetector CT imaging of the chest was performed following the standard protocol without IV contrast. RADIATION DOSE REDUCTION: This exam was performed according to the departmental dose-optimization program which includes automated exposure control, adjustment of the mA and/or kV according to patient size and/or use of iterative reconstruction technique. COMPARISON:  Chest XR, earlier same day. CT chest, 02/13/2023. US Thyroid, 07/11/2020. FINDINGS: Cardiovascular: No significant vascular findings. Normal heart size. No pericardial effusion. Mediastinum/Nodes: No enlarged mediastinal or axillary lymph nodes. 1.5 cm LEFT inferior thyroid nodule. Trachea, and esophagus demonstrate no significant findings. Lungs/Pleura: Nodular and  tree-in-bud opacities at the RIGHT basilar lung. The RIGHT lung is otherwise clear. Moderate-to-large volume loculated-appearing, LEFT lateral pleural effusion, measuring 11.0 x 4.0 x 20.0 cm. Additional intrapulmonary component, measuring approximately 3.0 x 3.0 x 11.0 cm. Atelectatic LEFT lung.  No pneumothorax. Pigtail catheter positioned at the LEFT basilar chest, and suspected subdiaphragmatic in location. See key image. Upper Abdomen: Nodular contour liver with small volume of perihepatic ascites. No acute abnormality. Musculoskeletal: No acute chest wall mass. No acute osseous abnormality. IMPRESSION: 1. Moderate-to-large volume loculated LEFT pleural effusion, measuring up to 20 cm in greatest dimension. 2. LEFT basilar pigtail drainage catheter appears sub-diaphragmatic in location. Consider removal. 3. RIGHT basilar nodular pulmonary opacities, consistent with multifocal pneumonia. 4. Cirrhotic morphology liver with small volume perihepatic ascites 5. Incidental 1.5 cm LEFT inferior thyroid nodule measuring 1.5 cm. Follow-up with a non-emergent, outpatient thyroid ultrasound. Reference: J Am Coll Radiol. 2015 Feb;12(2): 143-50 These results were called by telephone at the time of interpretation on 02/17/2023 to provider Janeann Forehand ICU APP, who verbally acknowledged these results. Roanna Banning, MD [facility] Radiology Electronically Signed   By: Roanna Banning M.D.   On: 02/17/2023 17:46   CT [facility] PLEURAL DRAIN W/INDWELL CATH W/IMG GUIDE Result Date: 02/17/2023 INDICATION: [id number] Pleural effusion [id number] EXAM: CT-GUIDED LEFT NON-TUNNELED PLEURAL DRAINAGE CATHETER PLACEMENT COMPARISON:  CT chest and chest XR, earlier same day. MEDICATIONS: The patient is currently admitted to the hospital and receiving intravenous antibiotics. The antibiotics were administered within an appropriate time frame prior to the initiation of the procedure. ANESTHESIA/SEDATION:  Moderate (conscious) sedation was employed during this procedure. A total of Versed 1 mg and Fentanyl 75 mcg was administered intravenously. Moderate Sedation  Time: 23 minutes. The patient's level of consciousness and vital signs were monitored continuously by radiology nursing throughout the procedure under my direct supervision. CONTRAST:  None COMPLICATIONS: None immediate. PROCEDURE: RADIATION DOSE REDUCTION: This exam was performed according to the departmental dose-optimization program which includes automated exposure control, adjustment of the mA and/or kV according to patient size and/or use of iterative reconstruction technique. Informed written consent was obtained from the patient and/or patient's representative after a discussion of the risks, benefits and alternatives to treatment. The patient was placed supine on the CT gantry and a pre procedural CT was performed re-demonstrating the known abscess/fluid collection within the LEFT chest. The procedure was planned. A timeout was performed prior to the initiation of the procedure. The LEFT lateral chest was prepped and draped in the usual sterile fashion. The overlying soft tissues were anesthetized with 1% lidocaine with epinephrine. Appropriate trajectory was planned with the use of a 22 gauge spinal needle. An 18 gauge trocar needle was advanced into the abscess/fluid collection and a short Amplatz super stiff wire was coiled within the collection. Appropriate positioning was confirmed with a limited CT scan. The tract was serially dilated allowing placement of a 14 Fr drainage catheter. Appropriate positioning was confirmed with a limited postprocedural CT scan. 25 mL of purulent fluid was aspirated. The tube was connected to a pleura vac and sutured in place. A dressing was placed. The patient tolerated the procedure well without immediate post procedural complication. IMPRESSION: Successful CT guided placement of a 14 Fr non tunneled pleural drain  catheter into the LEFT chest with aspiration of 25 mL of purulent fluid. Samples were sent to the laboratory as requested by the ordering clinical team. PLAN: - Fibrinolysis per Pulmonary team. Roanna Banning, MD [facility] Radiology Electronically Signed   By: Roanna Banning M.D.   On: 02/17/2023 16:53   DG Chest 1 View Result Date: 02/17/2023 CLINICAL DATA:  Chest tube. EXAM: CHEST  1 VIEW COMPARISON:  February 16, 2023. FINDINGS: Stable position of left-sided chest tube. Stable probably loculated left pleural effusion. IMPRESSION: Stable left-sided chest tube and probably loculated left pleural effusion. Electronically Signed   By: Lupita Raider M.D.   On: 02/17/2023 11:30      Subjective:  No significant events overnight, she denies any complaints today, eager to go back to rehab, reports she is moving 1-2 BMs per day Discharge Exam: Vitals:   03/18/23 0400 03/18/23 0800  BP: 119/74 (!) 126/54  Pulse: 61   Resp: 19 20  Temp: 98.2 F (36.8 C) 97.8 F (36.6 C)  SpO2: 93% 94%   Vitals:   03/17/23 2118 03/18/23 0000 03/18/23 0400 03/18/23 0800  BP:  120/68 119/74 (!) 126/54  Pulse:  77 61   Resp: 20 17 19 20   Temp: 98.3 F (36.8 C) 98.1 F (36.7 C) 98.2 F (36.8 C) 97.8 F (36.6 C)  TempSrc: Oral Oral Oral Oral  SpO2:  96% 93% 94%  Weight:      Height:        General: Pt is alert, awake, not in acute distress, extremely frail, deconditioned. Cardiovascular: RRR, S1/S2 +, no rubs, no gallops Respiratory: CTA bilaterally, no wheezing, no rhonchi Abdominal: Soft, NT, ND, bowel sounds + Extremities: no edema, no cyanosis    The results of significant diagnostics from this hospitalization (including imaging, microbiology, ancillary and laboratory) are listed below for reference.     Microbiology: Recent Results (from the past  240 hours)  Culture, blood (routine x 2)     Status: None (Preliminary result)   Collection Time:  03/15/23 11:41 PM   Specimen: BLOOD  Result Value Ref Range Status   Specimen Description BLOOD SITE NOT SPECIFIED  Final   Special Requests   Final    BOTTLES DRAWN AEROBIC AND ANAEROBIC Blood Culture results may not be optimal due to an inadequate volume of blood received in culture bottles   Culture   Final    NO GROWTH 2 DAYS Performed at [facility], [address], [place], [place] [address]    Report Status PENDING  Incomplete  Culture, blood (routine x 2)     Status: None (Preliminary result)   Collection Time: 03/15/23 11:46 PM   Specimen: BLOOD  Result Value Ref Range Status   Specimen Description BLOOD SITE NOT SPECIFIED  Final   Special Requests   Final    BOTTLES DRAWN AEROBIC AND ANAEROBIC Blood Culture results may not be optimal due to an inadequate volume of blood received in culture bottles   Culture   Final    NO GROWTH 2 DAYS Performed at [facility], [address], [place], [place] [address]    Report Status PENDING  Incomplete  Culture, body fluid w Gram Stain-bottle     Status: None (Preliminary result)   Collection Time: 03/16/23 10:55 AM   Specimen: Pleura  Result Value Ref Range Status   Specimen Description PLEURAL  Final   Special Requests NONE  Final   Culture   Final    NO GROWTH 2 DAYS Performed at [facility], [address] [place], [place] [address]    Report Status PENDING  Incomplete  Gram stain     Status: None   Collection Time: 03/16/23 10:55 AM   Specimen: Pleura  Result Value Ref Range Status   Specimen Description PLEURAL  Final   Special Requests NONE  Final   Gram Stain   Final    FEW WBC SEEN NO ORGANISMS SEEN Performed at [facility], [address] [place], [place] [address]    Report Status 03/16/2023 FINAL  Final     Labs: BNP (last 3 results) No results for input(s): "BNP" in the last 8760 hours. Basic Metabolic Panel: Recent Labs  Lab 03/15/23 2025 03/16/23 0727 03/18/23 0451  NA  136 137 134*  K 3.7 3.8 3.9  CL 108 107 106  CO2 21* 24 21*  GLUCOSE 105* 89 84  BUN 11 11 13   CREATININE 0.58 0.88 0.58  CALCIUM 8.0* 8.1* 8.0*  MG  --  1.9  --   PHOS  --  3.4  --    Liver Function Tests: Recent Labs  Lab 03/15/23 2342 03/16/23 0727 03/18/23 0451  AST 46* 37 34  ALT 29 26 25   ALKPHOS 131* 120 126  BILITOT 0.7 1.0 0.7  PROT 6.1* 6.1* 6.2*  ALBUMIN 1.8* 1.8* 1.9*   No results for input(s): "LIPASE", "AMYLASE" in the last 168 hours. Recent Labs  Lab 03/18/23 0451  AMMONIA 41*   CBC: Recent Labs  Lab 03/15/23 2025 03/16/23 0727 03/18/23 0451  WBC 10.3 7.3 8.3  HGB 11.7* 11.5* 11.2*  HCT 36.3 37.3 34.7*  MCV 94.5 95.9 94.0  PLT 216 173 191   Cardiac Enzymes: No results for input(s): "CKTOTAL", "CKMB", "CKMBINDEX", "TROPONINI" in the last 168 hours. BNP: Invalid input(s): "POCBNP" CBG: No results for input(s): "GLUCAP"  in the last 168 hours. D-Dimer No results for input(s): "DDIMER" in the last 72 hours. Hgb A1c No results for input(s): "HGBA1C" in the last 72 hours. Lipid Profile No results for input(s): "CHOL", "HDL", "LDLCALC", "TRIG", "CHOLHDL", "LDLDIRECT" in the last 72 hours. Thyroid function studies No results for input(s): "TSH", "T4TOTAL", "T3FREE", "THYROIDAB" in the last 72 hours.  Invalid input(s): "FREET3" Anemia work up No results for input(s): "VITAMINB12", "FOLATE", "FERRITIN", "TIBC", "IRON", "RETICCTPCT" in the last 72 hours. Urinalysis    Component Value Date/Time   COLORURINE YELLOW 07/01/2021 1207   APPEARANCEUR HAZY (A) 07/01/2021 1207   LABSPEC 1.034 (H) 07/01/2021 1207   PHURINE 6.0 07/01/2021 1207   GLUCOSEU NEGATIVE 07/01/2021 1207   HGBUR SMALL (A) 07/01/2021 1207   BILIRUBINUR NEGATIVE 07/01/2021 1207   KETONESUR 20 (A) 07/01/2021 1207   PROTEINUR NEGATIVE 07/01/2021 1207   UROBILINOGEN 0.2 12/17/2010 1447   NITRITE POSITIVE (A) 07/01/2021 1207   LEUKOCYTESUR LARGE (A) 07/01/2021 1207   Sepsis  Labs Recent Labs  Lab 03/15/23 2025 03/16/23 0727 03/18/23 0451  WBC 10.3 7.3 8.3   Microbiology Recent Results (from the past 240 hours)  Culture, blood (routine x 2)     Status: None (Preliminary result)   Collection Time: 03/15/23 11:41 PM   Specimen: BLOOD  Result Value Ref Range Status   Specimen Description BLOOD SITE NOT SPECIFIED  Final   Special Requests   Final    BOTTLES DRAWN AEROBIC AND ANAEROBIC Blood Culture results may not be optimal due to an inadequate volume of blood received in culture bottles   Culture   Final    NO GROWTH 2 DAYS Performed at [facility], [address], [place], [place] [address]    Report Status PENDING  Incomplete  Culture, blood (routine x 2)     Status: None (Preliminary result)   Collection Time: 03/15/23 11:46 PM   Specimen: BLOOD  Result Value Ref Range Status   Specimen Description BLOOD SITE NOT SPECIFIED  Final   Special Requests   Final    BOTTLES DRAWN AEROBIC AND ANAEROBIC Blood Culture results may not be optimal due to an inadequate volume of blood received in culture bottles   Culture   Final    NO GROWTH 2 DAYS Performed at [facility], [address], [place], [place] [address]    Report Status PENDING  Incomplete  Culture, body fluid w Gram Stain-bottle     Status: None (Preliminary result)   Collection Time: 03/16/23 10:55 AM   Specimen: Pleura  Result Value Ref Range Status   Specimen Description PLEURAL  Final   Special Requests NONE  Final   Culture   Final    NO GROWTH 2 DAYS Performed at [facility], [address], [place], [place] [address]    Report Status PENDING  Incomplete  Gram stain     Status: None   Collection Time: 03/16/23 10:55 AM   Specimen: Pleura  Result Value Ref Range Status   Specimen Description PLEURAL  Final   Special Requests NONE  Final   Gram Stain   Final    FEW WBC SEEN NO ORGANISMS SEEN Performed at [facility], [address], [place],  [place] [address]    Report Status 03/16/2023 FINAL  Final     Time coordinating discharge: Over 30 minutes  SIGNED:   Huey Bienenstock, MD  [facility] 03/18/2023, 12:35 PM Pager   If  7PM-7AM, please contact night-coverage [email or web address] Password [email or web address]

## 2023-03-18 NOTE — Progress Notes (Signed)
Patient ID: Anita Price, female   DOB: 1955/08/09, 67 y.o.   MRN: [id number]    Progress Note   Subjective   Day # 3 CC; chronic hepatitis C with cirrhosis, very recent left empyema now readmitted with progressive dyspnea and new large right pleural effusion, question hepatic hydrothorax  Hep C untreated  Hep C RNA quant pending LFTs normal WBC 8.3/hemoglobin 11.2/platelets 191  Current MELD-Na=8 Right thoracentesis this admission-950 cc removed  Patient sitting up in the chair, napping looks much more comfortable today off oxygen, no complaints of chest pain and no shortness of breath at rest. Eating without difficulty, no complaints of abdominal pain     Objective   Vital signs in last 24 hours: Temp:  [97.8 F (36.6 C)-98.3 F (36.8 C)] 97.8 F (36.6 C) (12/17 0800) Pulse Rate:  [61-79] 61 (12/17 0400) Resp:  [17-21] 20 (12/17 0800) BP: (119-147)/(54-78) 126/54 (12/17 0800) SpO2:  [93 %-98 %] 94 % (12/17 0800) Last BM Date : 03/17/23 General:   Older very thin, frail-appearing white female in NAD Heart:  Regular rate and rhythm; no murmurs Lungs: Respirations even and unlabored, decreased breath sounds bilateral bases right greater than left Abdomen:  Soft, nontender and nondistended.  No appreciable fluid wave normal bowel sounds. Extremities:  Without edema. Neurologic:  Alert and oriented,  grossly normal neurologically. Psych:  Cooperative. Normal mood and affect.  Intake/Output from previous day: No intake/output data recorded. Intake/Output this shift: No intake/output data recorded.  Lab Results: Recent Labs    03/15/23 2025 03/16/23 0727 03/18/23 0451  WBC 10.3 7.3 8.3  HGB 11.7* 11.5* 11.2*  HCT 36.3 37.3 34.7*  PLT 216 173 191   BMET Recent Labs    03/15/23 2025 03/16/23 0727 03/18/23 0451  NA 136 137 134*  K 3.7 3.8 3.9  CL 108 107 106  CO2 21* 24 21*  GLUCOSE 105* 89 84  BUN 11 11 13   CREATININE 0.58 0.88 0.58  CALCIUM 8.0* 8.1*  8.0*   LFT Recent Labs    03/15/23 2342 03/16/23 0727 03/18/23 0451  PROT 6.1*   < > 6.2*  ALBUMIN 1.8*   < > 1.9*  AST 46*   < > 34  ALT 29   < > 25  ALKPHOS 131*   < > 126  BILITOT 0.7   < > 0.7  BILIDIR 0.2  --   --   IBILI 0.5  --   --    < > = values in this interval not displayed.   PT/INR Recent Labs    03/15/23 2342  LABPROT 15.5*  INR 1.2        Assessment / Plan:     #22 67 year old female with history of longstanding untreated hepatitis C, documented cirrhosis which appears to have progressed with current imaging now showing obvious portal hypertension and ascites /mild  Paracentesis last admission as part of workup for pleural effusion/empyema-cell counts negative for SBP.  She does not have any appreciable ascites currently by exam Continues on Aldactone 100 mg daily  #2 recent large left effusion/empyema  #3 readmission 03/16/2023 with progressive shortness of breath and right-sided chest pain and found to have a new large right effusion Improved after thoracentesis with removal of 950 cc  This is not felt to be secondary to hepatic hydrothorax, generally stable with severely decompensated cirrhosis, and very rare to have bilateral effusions  Cell counts from initial left effusion/empyema more consistent with exudative  Parameters as  per yesterday's note which we were able to calculate are not suggestive of fluid secondary to hepatic hydrothorax  #4 new PE and small splenic infarct-continues on Eliquis   Plan; patient is to be discharged back to skilled nursing facility today Will complete course of oral Augmentin She should be on a 2 g sodium diet Continue Aldactone 100 mg daily Continue propranolol, consider switching to Corgard Will need referral set up to [facility] for management of cirrhosis and treatment of hep C   Principal Problem:   Pleural effusion Active Problems:   Protein-calorie malnutrition, severe     LOS: 2 days    Evonda Enge PA-C 03/18/2023, 11:39 AM

## 2023-03-18 NOTE — Plan of Care (Signed)

## 2023-03-18 NOTE — Plan of Care (Signed)

## 2023-03-19 LAB — HCV RNA QUANT RFLX ULTRA OR GENOTYP
HCV RNA Qnt(log copy/mL): 6.279 {Log}
HepC Qn: 1900000 [IU]/mL

## 2023-03-19 LAB — HEPATITIS C GENOTYPE: Hepatitis C Genotype: 3

## 2023-03-21 ENCOUNTER — Other Ambulatory Visit: Payer: Self-pay

## 2023-03-21 ENCOUNTER — Emergency Department (HOSPITAL_COMMUNITY): Payer: Medicare Other

## 2023-03-21 ENCOUNTER — Encounter (HOSPITAL_COMMUNITY): Payer: Self-pay

## 2023-03-21 ENCOUNTER — Emergency Department (HOSPITAL_COMMUNITY)
Admission: EM | Admit: 2023-03-21 | Discharge: 2023-03-21 | Disposition: A | Discharge status: Home / Self Care | Payer: Medicare Other | Attending: Emergency Medicine | Admitting: Emergency Medicine

## 2023-03-21 DIAGNOSIS — M546 Pain in thoracic spine: Secondary | ICD-10-CM | POA: Diagnosis not present

## 2023-03-21 DIAGNOSIS — M542 Cervicalgia: Secondary | ICD-10-CM | POA: Insufficient documentation

## 2023-03-21 DIAGNOSIS — M25511 Pain in right shoulder: Secondary | ICD-10-CM | POA: Diagnosis not present

## 2023-03-21 DIAGNOSIS — R519 Headache, unspecified: Secondary | ICD-10-CM | POA: Diagnosis present

## 2023-03-21 DIAGNOSIS — Z7901 Long term (current) use of anticoagulants: Secondary | ICD-10-CM | POA: Diagnosis not present

## 2023-03-21 DIAGNOSIS — W19XXXA Unspecified fall, initial encounter: Secondary | ICD-10-CM

## 2023-03-21 LAB — CULTURE, BLOOD (ROUTINE X 2)
Culture: NO GROWTH
Culture: NO GROWTH

## 2023-03-21 LAB — CBC
HCT: 36 % (ref 36.0–46.0)
Hemoglobin: 12 g/dL (ref 12.0–15.0)
MCH: 31.1 pg (ref 26.0–34.0)
MCHC: 33.3 g/dL (ref 30.0–36.0)
MCV: 93.3 fL (ref 80.0–100.0)
Platelets: 226 10*3/uL (ref 150–400)
RBC: 3.86 MIL/uL — ABNORMAL LOW (ref 3.87–5.11)
RDW: 16.1 % — ABNORMAL HIGH (ref 11.5–15.5)
WBC: 7.4 10*3/uL (ref 4.0–10.5)
nRBC: 0 % (ref 0.0–0.2)

## 2023-03-21 LAB — AMMONIA: Ammonia: 25 umol/L (ref 9–35)

## 2023-03-21 LAB — COMPREHENSIVE METABOLIC PANEL
ALT: 24 U/L (ref 0–44)
AST: 29 U/L (ref 15–41)
Albumin: 2 g/dL — ABNORMAL LOW (ref 3.5–5.0)
Alkaline Phosphatase: 130 U/L — ABNORMAL HIGH (ref 38–126)
Anion gap: 7 (ref 5–15)
BUN: 9 mg/dL (ref 8–23)
CO2: 21 mmol/L — ABNORMAL LOW (ref 22–32)
Calcium: 8.1 mg/dL — ABNORMAL LOW (ref 8.9–10.3)
Chloride: 106 mmol/L (ref 98–111)
Creatinine, Ser: 0.39 mg/dL — ABNORMAL LOW (ref 0.44–1.00)
GFR, Estimated: >60 mL/min (ref >60)
Glucose, Bld: 91 mg/dL (ref 70–99)
Potassium: 3.9 mmol/L (ref 3.5–5.1)
Sodium: 134 mmol/L — ABNORMAL LOW (ref 135–145)
Total Bilirubin: 0.7 mg/dL (ref <1.2)
Total Protein: 6.4 g/dL — ABNORMAL LOW (ref 6.5–8.1)

## 2023-03-21 LAB — CULTURE, BODY FLUID W GRAM STAIN -BOTTLE: Culture: NO GROWTH

## 2023-03-21 LAB — TROPONIN I (HIGH SENSITIVITY): Troponin I (High Sensitivity): 7 ng/L (ref <18)

## 2023-03-21 MED ORDER — MORPHINE SULFATE (PF) 4 MG/ML IV SOLN
4.0000 mg | Freq: Once | INTRAVENOUS | Status: AC
Start: 2023-03-21 — End: 2023-03-21
  Administered 2023-03-21: 4 mg via INTRAVENOUS
  Filled 2023-03-21: qty 1

## 2023-03-21 MED ORDER — ONDANSETRON HCL 4 MG/2ML IJ SOLN
4.0000 mg | Freq: Once | INTRAMUSCULAR | Status: AC
  Administered 2023-03-21: 4 mg via INTRAVENOUS
  Filled 2023-03-21: qty 2

## 2023-03-21 NOTE — ED Notes (Signed)
[facility] called; eta "about an hour"

## 2023-03-21 NOTE — Discharge Instructions (Signed)
The x-rays did not show any serious injury.  Follow-up with your doctor to be rechecked.  I recommend using a walker to help you with your balance

## 2023-03-21 NOTE — ED Notes (Addendum)
Pt ambulated by tech.  Pt was unsteady on her feet and required assistance. Message sent to provider.

## 2023-03-21 NOTE — ED Triage Notes (Signed)
Pt bib ems from [place] [facility]; found on ground at shift change, remembers falling, pain to L head and R shoulder, no loc; on Eliquis; 20 rfa; hx metabolic encephalopathy; 140/92, hr 110, rr 22, a and o x 3; some confusion at baseline; c collar in place

## 2023-03-21 NOTE — ED Provider Notes (Signed)
[place] EMERGENCY DEPARTMENT AT [facility] Provider Note   CSN: [id number] Arrival date & time: 03/21/23  4098     History  Chief complaint: [name]  Anita Price is a 67 y.o. female.  HPI   Patient is a resident of a nursing facility.  She has a history of hyponatremia prolonged QT, hepatitis C and cirrhosis, metabolic encephalopathy, acute kidney injury atrial fibrillation who presents to the ED for evaluation after a fall.  Patient's at a nursing facility.  She remembers trying to get up in the [place] and falling.  Patient is not exactly sure why she fell.  She does not think she lost consciousness.  Patient was not able to get up on her own.  Staff at the facility found her on the ground at shift change and called EMS.  Patient states she has a mild headache on the left side.  She is also having pain in her right shoulder.  She also has some pain in her back.  No abdominal pain.  No chest pain.  No vomiting or diarrhea.  No fevers  Home Medications Prior to Admission medications   Medication Sig Start Date End Date Taking? Authorizing Provider  amoxicillin-clavulanate (AUGMENTIN) 875-125 MG tablet Take 1 tablet by mouth every 12 (twelve) hours for 10 days. 03/18/23 03/28/23  Elgergawy, Leana Roe, MD  apixaban (ELIQUIS) 5 MG TABS tablet Take 1 tablet (5 mg total) by mouth 2 (two) times daily. 03/06/23   Marinda Elk, MD  diclofenac Sodium (VOLTAREN) 1 % GEL Apply 2 g topically 4 (four) times daily.    [provider]  Lactulose 20 GM/30ML SOLN Take 30 mLs (20 g total) by mouth 2 (two) times daily as needed (Please give if no 1-2 BMs per day). 03/18/23   Elgergawy, Leana Roe, MD  oxyCODONE (OXY IR/ROXICODONE) 5 MG immediate release tablet Take 0.5 tablets (2.5 mg total) by mouth every 6 (six) hours as needed for moderate pain (pain score 4-6) or breakthrough pain. 03/18/23   Elgergawy, Leana Roe, MD  pantoprazole (PROTONIX) 40 MG tablet Take 1 tablet (40 mg  total) by mouth daily. 03/07/23   Marinda Elk, MD  pregabalin (LYRICA) 300 MG capsule Take 300 mg by mouth in the morning and at bedtime. 01/03/20   [provider]  propranolol (INDERAL) 40 MG tablet Take 1 tablet (40 mg total) by mouth 3 (three) times daily. 03/06/23   Marinda Elk, MD  spironolactone (ALDACTONE) 100 MG tablet Take 1 tablet (100 mg total) by mouth daily. 03/07/23   Marinda Elk, MD      Allergies    Celecoxib, Duloxetine hcl, Tizanidine, Venlafaxine, Effersyllium [psyllium], Escitalopram, Neurontin [gabapentin], and Tramadol    Review of Systems   Review of Systems  Physical Exam Updated Vital Signs BP 107/69   Pulse 80   Temp 97.7 F (36.5 C) (Oral)   Resp 19   Ht 1.651 m (5\' 5" )   Wt 42.6 kg   SpO2 100%   BMI 15.64 kg/m  Physical Exam Vitals and nursing note reviewed.  Constitutional:      Appearance: She is well-developed.     Comments: Underweight, frail  HENT:     Head: Normocephalic and atraumatic.     Right Ear: External ear normal.     Left Ear: External ear normal.  Eyes:     General: No scleral icterus.       Right eye: No discharge.  Left eye: No discharge.     Conjunctiva/sclera: Conjunctivae normal.  Neck:     Trachea: No tracheal deviation.  Cardiovascular:     Rate and Rhythm: Normal rate and regular rhythm.  Pulmonary:     Effort: Pulmonary effort is normal. No respiratory distress.     Breath sounds: Normal breath sounds. No stridor. No wheezing or rales.  Abdominal:     General: Bowel sounds are normal. There is no distension.     Palpations: Abdomen is soft.     Tenderness: There is no abdominal tenderness. There is no guarding or rebound.  Musculoskeletal:        General: No deformity.     Cervical back: Neck supple. Tenderness present.     Thoracic back: Tenderness present.     Lumbar back: No tenderness.     Comments: No tenderness palpation bilateral lower extremities, no discomfort  with range of motion of the hips, no tenderness palpation left upper extremity, tenderness palpation right shoulder, No deformity swelling noted in the extremities  Skin:    General: Skin is warm and dry.     Findings: No rash.  Neurological:     General: No focal deficit present.     Mental Status: She is alert.     Cranial Nerves: No cranial nerve deficit, dysarthria or facial asymmetry.     Sensory: No sensory deficit.     Motor: No abnormal muscle tone or seizure activity.     Coordination: Coordination normal.  Psychiatric:        Mood and Affect: Mood normal.     ED Results / Procedures / Treatments   Labs (all labs ordered are listed, but only abnormal results are displayed) Labs Reviewed  CBC - Abnormal; Notable for the following components:      Result Value   RBC 3.86 (*)    RDW 16.1 (*)    All other components within normal limits  COMPREHENSIVE METABOLIC PANEL - Abnormal; Notable for the following components:   Sodium 134 (*)    CO2 21 (*)    Creatinine, Ser 0.39 (*)    Calcium 8.1 (*)    Total Protein 6.4 (*)    Albumin 2.0 (*)    Alkaline Phosphatase 130 (*)    All other components within normal limits  AMMONIA  TROPONIN I (HIGH SENSITIVITY)  TROPONIN I (HIGH SENSITIVITY)    EKG EKG Interpretation Date/Time:  Friday March 21 2023 09:49:22 EST Ventricular Rate:  64 PR Interval:  165 QRS Duration:  95 QT Interval:  467 QTC Calculation: 482 R Axis:   77  Text Interpretation: Sinus rhythm Consider right atrial enlargement Poor data quality Confirmed by Linwood Dibbles [phone]) on 03/21/2023 9:54:19 AM  Radiology CT Head Wo Contrast Result Date: 03/21/2023 CLINICAL DATA:  Head trauma, moderate-severe; Neck trauma (Age >= 65y) EXAM: CT HEAD WITHOUT CONTRAST CT CERVICAL SPINE WITHOUT CONTRAST TECHNIQUE: Multidetector CT imaging of the head and cervical spine was performed following the standard protocol without intravenous contrast. Multiplanar CT image  reconstructions of the cervical spine were also generated. RADIATION DOSE REDUCTION: This exam was performed according to the departmental dose-optimization program which includes automated exposure control, adjustment of the mA and/or kV according to patient size and/or use of iterative reconstruction technique. COMPARISON:  None Available. FINDINGS: CT HEAD FINDINGS Brain: No hemorrhage. No hydrocephalus. No extra-axial fluid collection. No CT evidence of an acute cortical infarct. No mass effect. No mass lesion. Vascular: No hyperdense vessel or  unexpected calcification. Skull: Normal. Negative for fracture or focal lesion. Sinuses/Orbits: No middle ear or mastoid effusion. Paranasal sinuses are clear. Orbits are unremarkable. Other: None. CT CERVICAL SPINE FINDINGS Alignment: Normal. Skull base and vertebrae: No acute fracture. No primary bone lesion or focal pathologic process. Status post C3-C6 ACDF without evidence of hardware complications Soft tissues and spinal canal: No prevertebral fluid or swelling. No visible canal hematoma. Disc levels:  No CT evidence of high-grade spinal canal stenosis Upper chest: Biapical pleural-parenchymal scarring Other: Severe odontogenic disease. IMPRESSION: 1. No CT evidence of intracranial injury. 2. No acute fracture or traumatic subluxation of the cervical spine. Electronically Signed   By: Lorenza Cambridge M.D.   On: 03/21/2023 11:08   CT Cervical Spine Wo Contrast Result Date: 03/21/2023 CLINICAL DATA:  Head trauma, moderate-severe; Neck trauma (Age >= 65y) EXAM: CT HEAD WITHOUT CONTRAST CT CERVICAL SPINE WITHOUT CONTRAST TECHNIQUE: Multidetector CT imaging of the head and cervical spine was performed following the standard protocol without intravenous contrast. Multiplanar CT image reconstructions of the cervical spine were also generated. RADIATION DOSE REDUCTION: This exam was performed according to the departmental dose-optimization program which includes automated  exposure control, adjustment of the mA and/or kV according to patient size and/or use of iterative reconstruction technique. COMPARISON:  None Available. FINDINGS: CT HEAD FINDINGS Brain: No hemorrhage. No hydrocephalus. No extra-axial fluid collection. No CT evidence of an acute cortical infarct. No mass effect. No mass lesion. Vascular: No hyperdense vessel or unexpected calcification. Skull: Normal. Negative for fracture or focal lesion. Sinuses/Orbits: No middle ear or mastoid effusion. Paranasal sinuses are clear. Orbits are unremarkable. Other: None. CT CERVICAL SPINE FINDINGS Alignment: Normal. Skull base and vertebrae: No acute fracture. No primary bone lesion or focal pathologic process. Status post C3-C6 ACDF without evidence of hardware complications Soft tissues and spinal canal: No prevertebral fluid or swelling. No visible canal hematoma. Disc levels:  No CT evidence of high-grade spinal canal stenosis Upper chest: Biapical pleural-parenchymal scarring Other: Severe odontogenic disease. IMPRESSION: 1. No CT evidence of intracranial injury. 2. No acute fracture or traumatic subluxation of the cervical spine. Electronically Signed   By: Lorenza Cambridge M.D.   On: 03/21/2023 11:08   DG Pelvis Portable Result Date: 03/21/2023 CLINICAL DATA:  Unwitnessed fall. EXAM: PORTABLE PELVIS 1-2 VIEWS COMPARISON:  None Available. FINDINGS: There is no evidence of pelvic fracture or diastasis. No pelvic bone lesions are seen. IMPRESSION: Negative. Electronically Signed   By: Lupita Raider M.D.   On: 03/21/2023 11:06   DG Lumbar Spine 2-3 Views Result Date: 03/21/2023 CLINICAL DATA:  Low back pain after fall. EXAM: LUMBAR SPINE - 2-3 VIEW COMPARISON:  June 13, 2010. FINDINGS: Status post surgical posterior fusion of L4-5 with bilateral intrapedicular screw placement. No fracture or spondylolisthesis is noted. Moderate degenerative disc disease is noted at L2-3. Mild degenerative disc disease is noted at L3-4.  IMPRESSION: Postoperative and degenerative changes as noted above. No acute abnormality seen. Electronically Signed   By: Lupita Raider M.D.   On: 03/21/2023 10:47   DG Thoracic Spine 2 View Result Date: 03/21/2023 CLINICAL DATA:  Unwitnessed fall. EXAM: THORACIC SPINE 2 VIEWS COMPARISON:  None Available. FINDINGS: There is no evidence of thoracic spine fracture. Alignment is normal. No other significant bone abnormalities are identified. IMPRESSION: Negative. Electronically Signed   By: Lupita Raider M.D.   On: 03/21/2023 10:45   DG Chest Portable 1 View Result Date: 03/21/2023 CLINICAL DATA:  Found down  EXAM: PORTABLE CHEST 1 VIEW COMPARISON:  03/16/2023 FINDINGS: Single frontal view of the chest demonstrates an unremarkable cardiac silhouette. Small right pleural effusion, slightly increased since prior study. Fluid again identified trapped within the left major fissure. Rounded areas of consolidation in the left perihilar region unchanged since prior CT. No pneumothorax. No acute displaced fractures. IMPRESSION: 1. Small right pleural effusion, slightly increased since prior study. 2. Stable left upper lobe nodular consolidation, unchanged since recent CT. Small amount of fluid within the left major fissure also unchanged. Electronically Signed   By: Sharlet Salina M.D.   On: 03/21/2023 10:44   DG Shoulder Right Portable Result Date: 03/21/2023 CLINICAL DATA:  Unwitnessed fall. EXAM: RIGHT SHOULDER - 1 VIEW COMPARISON:  None Available. FINDINGS: There is no evidence of fracture or dislocation. There is no evidence of arthropathy or other focal bone abnormality. Soft tissues are unremarkable. IMPRESSION: Negative. Electronically Signed   By: Lupita Raider M.D.   On: 03/21/2023 10:43    Procedures Procedures    Medications Ordered in ED Medications  morphine (PF) 4 MG/ML injection 4 mg (4 mg Intravenous Given 03/21/23 1051)  ondansetron (ZOFRAN) injection 4 mg (4 mg Intravenous Given  03/21/23 1048)    ED Course/ Medical Decision Making/ A&P Clinical Course as of 03/21/23 1422  Fri Mar 21, 2023  1120 X-rays do not show any signs of acute fracture.  No acute abnormality noted on head CT or C-spine CT.  X-ray does show small right pleural effusion and pulmonary nodule unchanged compared to recent CT scan [JK]  1235 CBC(!) CBC normal.  Ammonia level normal [JK]  1418 Patient was able to ambulate but she did require assistance. [JK]    Clinical Course User Index [JK] Linwood Dibbles, MD                                 Medical Decision Making Patient at risk for closed head injury cervical spine fracture metabolic management anemia dehydration  Problems Addressed: Fall, initial encounter: acute illness or injury that poses a threat to life or bodily functions  Amount and/or Complexity of Data Reviewed Labs: ordered. Decision-making details documented in ED Course. Radiology: ordered.  Risk Prescription drug management.   Patient presented to the ED for evaluation after a fall.  ED workup fortunately without signs of any serious injury.  No signs of closed head injury cervical spine fracture.  Patient's plain films do not show evidence of fracture  Patient's laboratory test do not show any evidence of anemia or severe dehydration.  She has normal troponins.  Pneumonia will not elevated  Patient was able to ambulate in the ED although she is unsteady.  No focal deficits to suggest stroke.  Patient is currently in a nursing rehab facility.  Will have her discharge back to the nursing facility.  Recommended she use a walker to help with her gait.  Evaluation and diagnostic testing in the emergency department does not suggest an emergent condition requiring admission or immediate intervention beyond what has been performed at this time.  The patient is safe for discharge and has been instructed to return immediately for worsening symptoms, change in symptoms or any other  concerns.        Final Clinical Impression(s) / ED Diagnoses Final diagnoses:  Fall, initial encounter    Rx / DC Orders ED Discharge Orders  Ordered    Walker rolling        03/21/23 1422              Linwood Dibbles, MD 03/21/23 [phone]

## 2023-03-21 NOTE — ED Notes (Signed)
Spoke to daughter and gave her an update.

## 2023-03-21 NOTE — ED Notes (Signed)
Pt has significant back and elbow pain.  Phlebotomy in room attempting to grab labs due to IV not pulling back.

## 2023-03-21 NOTE — ED Notes (Signed)
Pt transported to xray 

## 2023-04-04 ENCOUNTER — Encounter (HOSPITAL_COMMUNITY): Payer: Self-pay | Admitting: Physician Assistant

## 2023-04-04 ENCOUNTER — Ambulatory Visit (HOSPITAL_BASED_OUTPATIENT_CLINIC_OR_DEPARTMENT_OTHER)
Admit: 2023-04-04 | Discharge: 2023-04-04 | Disposition: A | Discharge status: Home / Self Care | Payer: Medicare Other | Attending: Physician Assistant | Admitting: Physician Assistant

## 2023-04-04 ENCOUNTER — Emergency Department (HOSPITAL_COMMUNITY)
Admission: EM | Admit: 2023-04-04 | Discharge: 2023-04-04 | Discharge status: Against Medical Advice | Payer: Medicare Other | Attending: Physician Assistant | Admitting: Physician Assistant

## 2023-04-04 VITALS — BP 104/82 | HR 107 | Ht 65.0 in | Wt 82.4 lb

## 2023-04-04 DIAGNOSIS — I480 Paroxysmal atrial fibrillation: Secondary | ICD-10-CM | POA: Insufficient documentation

## 2023-04-04 DIAGNOSIS — R079 Chest pain, unspecified: Secondary | ICD-10-CM | POA: Insufficient documentation

## 2023-04-04 DIAGNOSIS — Z7901 Long term (current) use of anticoagulants: Secondary | ICD-10-CM | POA: Insufficient documentation

## 2023-04-04 DIAGNOSIS — J984 Other disorders of lung: Secondary | ICD-10-CM | POA: Insufficient documentation

## 2023-04-04 DIAGNOSIS — D6869 Other thrombophilia: Secondary | ICD-10-CM | POA: Insufficient documentation

## 2023-04-04 DIAGNOSIS — R0602 Shortness of breath: Secondary | ICD-10-CM

## 2023-04-04 DIAGNOSIS — Z5321 Procedure and treatment not carried out due to patient leaving prior to being seen by health care provider: Secondary | ICD-10-CM | POA: Insufficient documentation

## 2023-04-04 DIAGNOSIS — R000 Tachycardia, unspecified: Secondary | ICD-10-CM | POA: Insufficient documentation

## 2023-04-04 DIAGNOSIS — Z79899 Other long term (current) drug therapy: Secondary | ICD-10-CM | POA: Insufficient documentation

## 2023-04-04 DIAGNOSIS — K7460 Unspecified cirrhosis of liver: Secondary | ICD-10-CM | POA: Insufficient documentation

## 2023-04-04 DIAGNOSIS — Z8619 Personal history of other infectious and parasitic diseases: Secondary | ICD-10-CM | POA: Insufficient documentation

## 2023-04-04 NOTE — ED Notes (Signed)
 Pt stated that she is no longer feeling any shob or chest pain, she has chosen to leave. PT left the lobby at 1242.

## 2023-04-04 NOTE — Progress Notes (Signed)
 Primary Care Physician: Catalina Bare, MD Primary Cardiologist: Madonna Large, DO Electrophysiologist: None  Referring Physician: Dr Large Ronal Anita Price is a 68 y.o. female with a history of HTN, liver cirrhosis, hep C, atrial fibrillation who presents for follow up in the [facility].  The patient was initially diagnosed with atrial fibrillation 03/2023 in the setting of other severe medical illnesses (PE, empyema, decompensated cirrhosis). Patient is on Eliquis  for stroke prevention and PE. She converted back to SR with treatment of her other medical issues. She has been maintained on propranolol .   On follow up today, patient reports that starting this AM she has felt much more SOB. She is also having right sided chest and back pain, similar to how she felt on her presentation 03/15/23 to the ED. She has not had any follow up with a PCP or GI since her last hospitalization. She is lethargic but is easily roused and answers questions appropriately. She has lost 11 lbs since 12/20.  Today, she denies symptoms of palpitations, orthopnea, PND, lower extremity edema, dizziness, presyncope, syncope, snoring, daytime somnolence, bleeding, or neurologic sequela. The patient is tolerating medications without difficulties and is otherwise without complaint today.    Atrial Fibrillation Risk Factors:  she does not have symptoms or diagnosis of sleep apnea. she does not have a history of rheumatic fever.   Atrial Fibrillation Management history:  Previous antiarrhythmic drugs: none Previous cardioversions: none Previous ablations: none Anticoagulation history: Eliquis   ROS- All systems are reviewed and negative except as per the HPI above.  Past Medical History:  Diagnosis Date   Acute metabolic encephalopathy    Chronic back pain    DJD (degenerative joint disease)    Essential hypertension 07/10/2020   Gallbladder sludge    Hepatitis C 07/10/2020    Liver cirrhosis (HCC)    Pain management    Prolonged QT interval    Protein calorie malnutrition (HCC)    Thyroid nodule    Tobacco use 07/10/2020    Current Outpatient Medications  Medication Sig Dispense Refill   apixaban  (ELIQUIS ) 5 MG TABS tablet Take 1 tablet (5 mg total) by mouth 2 (two) times daily.     diclofenac  Sodium (VOLTAREN ) 1 % GEL Apply 2 g topically 4 (four) times daily.     Lactulose  20 GM/30ML SOLN Take 30 mLs (20 g total) by mouth 2 (two) times daily as needed (Please give if no 1-2 BMs per day).     oxyCODONE  (OXY IR/ROXICODONE ) 5 MG immediate release tablet Take 0.5 tablets (2.5 mg total) by mouth every 6 (six) hours as needed for moderate pain (pain score 4-6) or breakthrough pain. 10 tablet 0   pantoprazole  (PROTONIX ) 40 MG tablet Take 1 tablet (40 mg total) by mouth daily.     pregabalin  (LYRICA ) 300 MG capsule Take 300 mg by mouth in the morning and at bedtime.     propranolol  (INDERAL ) 40 MG tablet Take 1 tablet (40 mg total) by mouth 3 (three) times daily.     spironolactone  (ALDACTONE ) 100 MG tablet Take 1 tablet (100 mg total) by mouth daily.     No current facility-administered medications for this encounter.    Physical Exam: BP 104/82   Pulse (!) 107   Ht 5' 5 (1.651 m)   Wt 37.4 kg   BMI 13.71 kg/m   GEN: Well nourished, well developed in no acute distress NECK: No JVD; No carotid bruits CARDIAC: Regular  rate and rhythm, no murmurs, rubs, gallops RESPIRATORY:  Decreased breath sounds bilaterally, splinting ABDOMEN: Soft, non-tender, non-distended EXTREMITIES:  No edema; No deformity   Wt Readings from Last 3 Encounters:  04/04/23 37.4 kg  03/21/23 42.6 kg  03/15/23 42.6 kg     EKG today demonstrates  Sinus tachycardia, long QT Vent. rate 107 BPM PR interval 126 ms QRS duration 70 ms QT/QTcB 418/558 ms  Echo 02/14/23 demonstrated   1. Left ventricular ejection fraction, by estimation, is 60 to 65%. The  left ventricle has normal  function. The left ventricle has no regional  wall motion abnormalities. Left ventricular diastolic parameters were  normal.   2. Right ventricular systolic function is normal. The right ventricular  size is normal.   3. The mitral valve is normal in structure. No evidence of mitral valve  regurgitation. No evidence of mitral stenosis.   4. The aortic valve is normal in structure. Aortic valve regurgitation is  not visualized. No aortic stenosis is present.   5. The inferior vena cava is normal in size with greater than 50%  respiratory variability, suggesting right atrial pressure of 3 mmHg.    CHA2DS2-VASc Score = 3  The patient's score is based upon: CHF History: 0 HTN History: 1 Diabetes History: 0 Stroke History: 0 Vascular Disease History: 0 Age Score: 1 Gender Score: 1       ASSESSMENT AND PLAN: Paroxysmal Atrial Fibrillation (ICD10:  I48.0) The patient's CHA2DS2-VASc score is 3, indicating a 3.2% annual risk of stroke.   Patient in SR today Her other medical issues predominate. Would treat arrhythmia conservatively. Continue Eliquis  5 mg BID Continue propranolol  40 mg TID  Secondary Hypercoagulable State (ICD10:  D68.69) The patient is at significant risk for stroke/thromboembolism based upon her CHA2DS2-VASc Score of 3.  Continue Apixaban  (Eliquis ).   Liver cirrhosis Patient reports that she did not have appointment with [facility] following her recent hospitalization. She has lost 11 lbs over the past 2-3 weeks. No ascites on exam today.  SOB/chest pain Concern for recurrent pleural effusion/empyema  Patient splinting over a lab table to alleviate symptoms. Similar to her presentation 12/14. Will refer to ED for evaluation, patient in agreement.     Patient to ED for evaluation.       Daril Kicks PA-C [facility] [address] [place], [place] [address] [phone]

## 2023-04-11 LAB — ACID FAST CULTURE WITH REFLEXED SENSITIVITIES (MYCOBACTERIA): Acid Fast Culture: NEGATIVE

## 2023-06-14 ENCOUNTER — Other Ambulatory Visit: Payer: Self-pay

## 2023-06-14 ENCOUNTER — Emergency Department (HOSPITAL_COMMUNITY)
Admission: EM | Admit: 2023-06-14 | Discharge: 2023-06-15 | Disposition: A | Discharge status: Home / Self Care | Attending: Emergency Medicine | Admitting: Emergency Medicine

## 2023-06-14 ENCOUNTER — Emergency Department (HOSPITAL_COMMUNITY)

## 2023-06-14 ENCOUNTER — Encounter (HOSPITAL_COMMUNITY): Payer: Self-pay

## 2023-06-14 DIAGNOSIS — R531 Weakness: Secondary | ICD-10-CM | POA: Diagnosis present

## 2023-06-14 DIAGNOSIS — I10 Essential (primary) hypertension: Secondary | ICD-10-CM | POA: Insufficient documentation

## 2023-06-14 DIAGNOSIS — R262 Difficulty in walking, not elsewhere classified: Secondary | ICD-10-CM | POA: Insufficient documentation

## 2023-06-14 DIAGNOSIS — Z79899 Other long term (current) drug therapy: Secondary | ICD-10-CM | POA: Diagnosis not present

## 2023-06-14 DIAGNOSIS — Z7901 Long term (current) use of anticoagulants: Secondary | ICD-10-CM | POA: Diagnosis not present

## 2023-06-14 LAB — DIFFERENTIAL
Abs Immature Granulocytes: 0.02 10*3/uL (ref 0.00–0.07)
Basophils Absolute: 0 10*3/uL (ref 0.0–0.1)
Basophils Relative: 1 %
Eosinophils Absolute: 0.1 10*3/uL (ref 0.0–0.5)
Eosinophils Relative: 2 %
Immature Granulocytes: 1 %
Lymphocytes Relative: 29 %
Lymphs Abs: 1 10*3/uL (ref 0.7–4.0)
Monocytes Absolute: 0.3 10*3/uL (ref 0.1–1.0)
Monocytes Relative: 8 %
Neutro Abs: 2.2 10*3/uL (ref 1.7–7.7)
Neutrophils Relative %: 59 %

## 2023-06-14 LAB — COMPREHENSIVE METABOLIC PANEL
ALT: 21 U/L (ref 0–44)
AST: 34 U/L (ref 15–41)
Albumin: 3.3 g/dL — ABNORMAL LOW (ref 3.5–5.0)
Alkaline Phosphatase: 90 U/L (ref 38–126)
Anion gap: 9 (ref 5–15)
BUN: 12 mg/dL (ref 8–23)
CO2: 23 mmol/L (ref 22–32)
Calcium: 8.6 mg/dL — ABNORMAL LOW (ref 8.9–10.3)
Chloride: 110 mmol/L (ref 98–111)
Creatinine, Ser: 0.62 mg/dL (ref 0.44–1.00)
GFR, Estimated: >60 mL/min (ref >60)
Glucose, Bld: 85 mg/dL (ref 70–99)
Potassium: 3 mmol/L — ABNORMAL LOW (ref 3.5–5.1)
Sodium: 142 mmol/L (ref 135–145)
Total Bilirubin: 0.9 mg/dL (ref 0.0–1.2)
Total Protein: 6.7 g/dL (ref 6.5–8.1)

## 2023-06-14 LAB — CBC
HCT: 43 % (ref 36.0–46.0)
Hemoglobin: 14 g/dL (ref 12.0–15.0)
MCH: 30.4 pg (ref 26.0–34.0)
MCHC: 32.6 g/dL (ref 30.0–36.0)
MCV: 93.3 fL (ref 80.0–100.0)
Platelets: 140 10*3/uL — ABNORMAL LOW (ref 150–400)
RBC: 4.61 MIL/uL (ref 3.87–5.11)
RDW: 14.3 % (ref 11.5–15.5)
WBC: 3.6 10*3/uL — ABNORMAL LOW (ref 4.0–10.5)
nRBC: 0 % (ref 0.0–0.2)

## 2023-06-14 LAB — PROTIME-INR
INR: 1.1 (ref 0.8–1.2)
Prothrombin Time: 13.9 s (ref 11.4–15.2)

## 2023-06-14 LAB — APTT: aPTT: 29 s (ref 24–36)

## 2023-06-14 LAB — ETHANOL: Alcohol, Ethyl (B): <10 mg/dL (ref <10)

## 2023-06-14 MED ORDER — PREGABALIN 50 MG PO CAPS
300.0000 mg | ORAL_CAPSULE | Freq: Every day | ORAL | Status: DC
Start: 2023-06-14 — End: 2023-06-15
  Administered 2023-06-14 – 2023-06-15 (×2): 300 mg via ORAL
  Filled 2023-06-14 (×2): qty 6

## 2023-06-14 MED ORDER — ACETAMINOPHEN 500 MG PO TABS
500.0000 mg | ORAL_TABLET | Freq: Four times a day (QID) | ORAL | Status: DC | PRN
  Administered 2023-06-15: 500 mg via ORAL
  Filled 2023-06-14: qty 1

## 2023-06-14 MED ORDER — DEXAMETHASONE SODIUM PHOSPHATE 10 MG/ML IJ SOLN
10.0000 mg | Freq: Once | INTRAMUSCULAR | Status: AC
  Administered 2023-06-14: 10 mg via INTRAVENOUS
  Filled 2023-06-14: qty 1

## 2023-06-14 MED ORDER — PROPRANOLOL HCL 20 MG PO TABS
40.0000 mg | ORAL_TABLET | Freq: Three times a day (TID) | ORAL | Status: DC
  Administered 2023-06-14 – 2023-06-15 (×2): 40 mg via ORAL
  Filled 2023-06-14 (×2): qty 2

## 2023-06-14 MED ORDER — PANTOPRAZOLE SODIUM 40 MG PO TBEC
40.0000 mg | DELAYED_RELEASE_TABLET | Freq: Every day | ORAL | Status: DC
  Administered 2023-06-14 – 2023-06-15 (×2): 40 mg via ORAL
  Filled 2023-06-14 (×2): qty 1

## 2023-06-14 MED ORDER — ACETAMINOPHEN 325 MG PO TABS: 650.0000 mg | ORAL_TABLET | Freq: Four times a day (QID) | ORAL | Status: DC | PRN

## 2023-06-14 MED ORDER — FENTANYL CITRATE PF 50 MCG/ML IJ SOSY
25.0000 ug | PREFILLED_SYRINGE | Freq: Once | INTRAMUSCULAR | Status: AC
  Administered 2023-06-14: 25 ug via INTRAVENOUS
  Filled 2023-06-14: qty 1

## 2023-06-14 MED ORDER — APIXABAN 5 MG PO TABS
5.0000 mg | ORAL_TABLET | Freq: Two times a day (BID) | ORAL | Status: DC
  Administered 2023-06-14 – 2023-06-15 (×2): 5 mg via ORAL
  Filled 2023-06-14 (×2): qty 1

## 2023-06-14 MED ORDER — DICLOFENAC EPOLAMINE 1.3 % EX PTCH
1.0000 | MEDICATED_PATCH | Freq: Two times a day (BID) | CUTANEOUS | Status: DC
  Administered 2023-06-14 – 2023-06-15 (×2): 1 via TRANSDERMAL
  Filled 2023-06-14 (×5): qty 1

## 2023-06-14 NOTE — ED Provider Notes (Signed)
 [place] EMERGENCY DEPARTMENT AT [facility] Provider Note   CSN: [id number] Arrival date & time: 06/14/23  1428     History  Chief Complaint  Patient presents with   Extremity Weakness    Anita Price is a 68 y.o. female.  HPI Patient with a history of prior stroke, hypertension, now presents with right lower extremity weakness.  History is a bit unclear, but it seems as though patient has old right lower extremity weakness since leaving rehabilitation.  She was at rehab for aphasia, reportedly.  No upper extremity weakness, no fever, fall, vomiting.  After her lower extremity weakness did not improve family members called for her to be brought here for evaluation.    Home Medications Prior to Admission medications   Medication Sig Start Date End Date Taking? Authorizing Provider  Acetaminophen (TYLENOL PO) Take 3 tablets by mouth daily as needed.   Yes [provider]  apixaban (ELIQUIS) 5 MG TABS tablet Take 1 tablet (5 mg total) by mouth 2 (two) times daily. 03/06/23  Yes Marinda Elk, MD  diclofenac Sodium (VOLTAREN) 1 % GEL Apply 2 g topically 4 (four) times daily.   Yes [provider]  Lactulose 20 GM/30ML SOLN Take 30 mLs (20 g total) by mouth 2 (two) times daily as needed (Please give if no 1-2 BMs per day). 03/18/23  Yes Elgergawy, Leana Roe, MD  oxyCODONE (OXY IR/ROXICODONE) 5 MG immediate release tablet Take 0.5 tablets (2.5 mg total) by mouth every 6 (six) hours as needed for moderate pain (pain score 4-6) or breakthrough pain. 03/18/23  Yes Elgergawy, Leana Roe, MD  pantoprazole (PROTONIX) 40 MG tablet Take 1 tablet (40 mg total) by mouth daily. 03/07/23  Yes Marinda Elk, MD  pregabalin (LYRICA) 300 MG capsule Take 300 mg by mouth in the morning and at bedtime. 01/03/20  Yes [provider]  propranolol (INDERAL) 40 MG tablet Take 1 tablet (40 mg total) by mouth 3 (three) times daily. 03/06/23  Yes Marinda Elk, MD      Allergies    Celecoxib, Duloxetine hcl, Tizanidine, Venlafaxine, Effersyllium [psyllium], Escitalopram, Neurontin [gabapentin], and Tramadol    Review of Systems   Review of Systems  Physical Exam Updated Vital Signs BP (!) 132/91   Pulse 90   Temp 97.7 F (36.5 C) (Oral)   Resp (!) 21   Ht 5\' 5"  (1.651 m)   Wt 37.4 kg   SpO2 98%   BMI 13.72 kg/m  Physical Exam Vitals and nursing note reviewed.  Constitutional:      General: She is not in acute distress.    Appearance: She is well-developed. She is ill-appearing.  HENT:     Head: Normocephalic and atraumatic.  Eyes:     Conjunctiva/sclera: Conjunctivae normal.  Cardiovascular:     Rate and Rhythm: Normal rate and regular rhythm.  Pulmonary:     Effort: Pulmonary effort is normal. No respiratory distress.     Breath sounds: Normal breath sounds. No stridor.  Abdominal:     General: There is no distension.  Musculoskeletal:     Comments: Atrophy throughout  Skin:    General: Skin is warm and dry.  Neurological:     Mental Status: She is alert and oriented to person, place, and time.     Cranial Nerves: No cranial nerve deficit, dysarthria or facial asymmetry.     Motor: Weakness and atrophy present. No tremor.     Comments:  Right lower extremity weak proximal and distal, 3/5, though she is able to hold it against gravity, she does so with effort, and has minimal capacity to move against resistance.  Psychiatric:        Mood and Affect: Mood normal.     ED Results / Procedures / Treatments   Labs (all labs ordered are listed, but only abnormal results are displayed) Labs Reviewed  CBC - Abnormal; Notable for the following components:      Result Value   WBC 3.6 (*)    Platelets 140 (*)    All other components within normal limits  COMPREHENSIVE METABOLIC PANEL - Abnormal; Notable for the following components:   Potassium 3.0 (*)    Calcium 8.6 (*)    Albumin 3.3 (*)    All other components  within normal limits  ETHANOL  PROTIME-INR  APTT  DIFFERENTIAL  RAPID URINE DRUG SCREEN, HOSP PERFORMED    EKG EKG Interpretation Date/Time:  Saturday June 14 2023 15:13:58 EDT Ventricular Rate:  79 PR Interval:  137 QRS Duration:  91 QT Interval:  402 QTC Calculation: 461 R Axis:   90  Text Interpretation: Sinus rhythm Ventricular premature complex Right atrial enlargement Borderline right axis deviation Nonspecific T abnormalities, lateral leads Artifact Confirmed by Gerhard Munch [phone]) on 06/14/2023 3:16:57 PM  Radiology MR BRAIN WO CONTRAST Result Date: 06/14/2023 CLINICAL DATA:  Neuro deficit, acute, stroke suspected. Right lower extremity weakness. EXAM: MRI HEAD WITHOUT CONTRAST TECHNIQUE: Multiplanar, multiecho pulse sequences of the brain and surrounding structures were obtained without intravenous contrast. COMPARISON:  Head CT 02/13/2023 FINDINGS: Brain: Diffusion imaging does not show any acute or subacute infarction or other cause of restricted diffusion. No focal abnormality affects the brainstem or cerebellum. Cerebral hemispheres show low moderate chronic small-vessel ischemic changes of the white matter. No cortical or large vessel territory infarction. No mass lesion, hemorrhage, hydrocephalus or extra-axial collection. Vascular: Major vessels at the base of the brain show flow. Skull and upper cervical spine: Skull and skull base are negative. Extensive cervical fusion beginning at C3. Sinuses/Orbits: Clear/normal Other: None IMPRESSION: No acute finding. Low moderate chronic small-vessel ischemic changes of the cerebral hemispheric white matter. Electronically Signed   By: Paulina Fusi M.D.   On: 06/14/2023 19:24   MR LUMBAR SPINE WO CONTRAST Result Date: 06/14/2023 CLINICAL DATA:  Low back pain. Prior surgery. Right lower extremity weakness. EXAM: MRI LUMBAR SPINE WITHOUT CONTRAST TECHNIQUE: Multiplanar, multisequence MR imaging of the lumbar spine was performed. No  intravenous contrast was administered. COMPARISON:  Radiography 03/21/2023. FINDINGS: Segmentation:  5 lumbar type vertebral bodies. Alignment:  Minimal scoliotic curvature.  No listhesis. Vertebrae: Distant fusion procedure at L4-5. Edematous endplate marrow changes at L2-3. Conus medullaris and cauda equina: Conus extends to the L1 level. Conus and cauda equina appear normal. Paraspinal and other soft tissues: Negative Disc levels: T12-L1: Normal L1-2: Left posterolateral disc herniation with a fragment in the left lateral recess. Left neural compression is possible, particularly affecting the left L2 nerve. L2-3: Disc degeneration with endplate osteophytes and bulging of the disc more towards the left. Discogenic endplate marrow changes which could relate to regional pain. No compressive central canal stenosis. Mild left foraminal stenosis. L3-4: Endplate osteophytes and bulging of the disc more prominent towards the left. Mild narrowing of the left lateral recess but no likely neural compression at this level. L4-5: Previous posterior decompression, diskectomy and fusion. Wide patency of the canal and foramina. L5-S1: Mild bulging of the disc.  No canal or foraminal stenosis. IMPRESSION: 1. L1-2: Left posterolateral disc herniation with a fragment in the left lateral recess. Left neural compression is possible, particularly affecting the left L2 nerve. 2. L2-3: Disc degeneration with endplate osteophytes and bulging of the disc more towards the left. Discogenic endplate marrow changes which could relate to regional pain. Mild left foraminal stenosis. 3. L3-4: Endplate osteophytes and bulging of the disc more prominent towards the left. Mild narrowing of the left lateral recess but no likely neural compression at this level. 4. L4-5: Previous posterior decompression, diskectomy and fusion. Wide patency of the canal and foramina. Electronically Signed   By: Paulina Fusi M.D.   On: 06/14/2023 19:22     Procedures Procedures    Medications Ordered in ED Medications  fentaNYL (SUBLIMAZE) injection 25 mcg (25 mcg Intravenous Given 06/14/23 1742)    ED Course/ Medical Decision Making/ A&P                                 Medical Decision Making Elderly female with substantial risk profile for stroke, with noted history of lower back issues as well, now presents with isolated right lower extremity weakness.  Differential of distal nerve impingement versus central pathology given her history of stroke.  Patient has no overt evidence for infection, bacteremia, sepsis.  Patient started on monitors, MRI, labs ordered. Cardiac 85 sinus normal and pulse ox 100% room air  Amount and/or Complexity of Data Reviewed Labs: ordered. Decision-making details documented in ED Course. Radiology: ordered and independent interpretation performed. Decision-making details documented in ED Course. ECG/medicine tests: ordered and independent interpretation performed. Decision-making details documented in ED Course.  Risk OTC drugs. Prescription drug management. Decision regarding hospitalization. Diagnosis or treatment significantly limited by social determinants of health.   7:49 PM Patient continued to have inability to walk, secondary to weakness in the lower extremities.  MRIs reviewed, discussed, no evidence for stroke, there is a substantial lower  lumbar disease, though interestingly largely on the left.  Patient has no evidence for infection, discitis, osteomyelitis, no evidence for fracture, no history of fall, but given her inability to walk, she and I discussed discharge planning, patient will have social work consult for facilitation of return to her nursing facility.  Final Clinical Impression(s) / ED Diagnoses Final diagnoses:  Weakness     Gerhard Munch, MD 06/14/23 2320

## 2023-06-14 NOTE — ED Notes (Signed)
Changed brief

## 2023-06-14 NOTE — ED Triage Notes (Addendum)
 Pt BIB EMS from home. Pt left rehab [facility] 2 weeks ago and has been having leg weakness and not being able to walk.

## 2023-06-14 NOTE — ED Notes (Signed)
 Patient transported to MRI

## 2023-06-15 MED ORDER — LACTATED RINGERS IV BOLUS: 1000.0000 mL | Freq: Once | INTRAVENOUS | Status: DC

## 2023-06-15 MED ORDER — HYDROCODONE-ACETAMINOPHEN 5-325 MG PO TABS
1.0000 | ORAL_TABLET | Freq: Once | ORAL | Status: AC
  Administered 2023-06-15: 1 via ORAL
  Filled 2023-06-15: qty 1

## 2023-06-15 NOTE — ED Notes (Signed)
 Pt brief changed. Pts purewick tubing had a kink so unable to collect urine at this time. Pts given ice water and crackers per request.

## 2023-06-15 NOTE — Care Management (Addendum)
 Transition of Care [facility]) - Emergency Department Mini Assessment   Patient Details  Name: Anita Price MRN: [id number] Date of Birth: [birth year]  [facility] of [facility]) CM/SW Contact:    Lockie Pares, RN Phone Number: 06/15/2023, 10:22 AM   Clinical Narrative:  Spoke to CSW, the patient will go home with home health. They are obtaining a PT consult prior to discharge to document her baseline post rehab. She will likely need PT OT aide and social work. Called daughter  Efraim Kaufmann to see if there are any DME needs at home.No answer at this time.  [facility] called for acceptance for [facility]5 called [facility] again, no answer, mailbox is full accepted by [facility] with a start of tomorrow 1415 PT evaluation in , ordered Wheelchair and BSC per recommendations. [employer] will provide and deliver to home ED Mini Assessment: What brought you to the Emergency Department? : weakness  Barriers to Discharge: No Barriers Identified        Interventions which prevented an admission or readmission: Home Health Consult or Services    Patient Contact and Communications Key Contact 1: Patient Key Contact 2: [name] (Daughter)  [phone] (Mobile) Spoke with: Patient Contact Date: 06/15/23,   Contact time: 0930   Call outcome: Patient agreeable to [facility]         Admission diagnosis:  leg weakness Patient Active Problem List   Diagnosis Date Noted   Hypercoagulable state due to paroxysmal atrial fibrillation (HCC) 04/04/2023   Protein-calorie malnutrition, severe 03/17/2023   Pleural effusion 03/16/2023   Hypomagnesemia 03/05/2023   Paroxysmal atrial fibrillation (HCC) 03/04/2023   Paroxysmal atrial flutter (HCC) 03/04/2023   Prolonged Q-T interval on ECG 03/04/2023   Atrial fibrillation and flutter (HCC) 03/03/2023   Pulmonary embolus (HCC) 03/03/2023   Poor appetite 02/24/2023   Medication management 02/24/2023   DNR (do not resuscitate) discussion 02/21/2023   Lactic  acidosis 02/18/2023   Malnutrition of moderate degree 02/18/2023   Palliative care encounter 02/18/2023   Goals of care, counseling/discussion 02/18/2023   Counseling and coordination of care 02/18/2023   Need for emotional support 02/18/2023   Sepsis (HCC) 02/18/2023   Hypophosphatemia 02/16/2023   Thrombocytopenia (HCC) 02/15/2023   Abnormal ultrasound of uterus 02/14/2023   Hypokalemia 02/14/2023   Uncomplicated opioid dependence (HCC) 02/14/2023   Empyema (HCC) 02/13/2023   AKI (acute kidney injury) (HCC) 02/13/2023   Acute metabolic encephalopathy 06/30/2021   Diarrhea 06/30/2021   Hepatic cirrhosis in setting of untreated hepatitis C     Essential hypertension 07/10/2020   Hepatitis C 07/10/2020   Tobacco use 07/10/2020   Macrocytosis 07/10/2020   Hyponatremia 07/10/2020   Hyperglycemia 07/10/2020   Prolonged QT interval 07/10/2020   Sepsis due to Streptococcus intermedius empyema (HCC) 07/10/2020   Cervical radiculopathy 12/04/2014   Chronic pain syndrome due to chronic back pain  08/27/2011   PCP:  Jackie Plum, MD Pharmacy:   [facility] [id number] - [address], [place] - [address] [place] [address] Phone: [phone] Fax: [phone]  [facility] - [place], [place] - [address] [place] [address] Phone: [phone] Fax: [phone]

## 2023-06-15 NOTE — ED Provider Notes (Addendum)
 Emergency Medicine Observation Re-evaluation Note  Anita Price is a 68 y.o. female, seen on rounds today.  Pt initially presented to the ED for complaints of Extremity Weakness Currently, the patient is resting comfortably.  Physical Exam  BP 111/64   Pulse 75   Temp 98.6 F (37 C) (Oral)   Resp 16   Ht 5\' 5"  (1.651 m)   Wt 37.4 kg   SpO2 99%   BMI 13.72 kg/m  Physical Exam Vitals and nursing note reviewed.  Constitutional:      General: She is not in acute distress.    Appearance: She is well-developed.  HENT:     Head: Normocephalic and atraumatic.  Eyes:     Conjunctiva/sclera: Conjunctivae normal.  Pulmonary:     Effort: Pulmonary effort is normal. No respiratory distress.  Musculoskeletal:        General: No swelling.     Cervical back: Neck supple.  Skin:    General: Skin is warm and dry.     Capillary Refill: Capillary refill takes less than 2 seconds.  Neurological:     Mental Status: She is alert.  Psychiatric:        Mood and Affect: Mood normal.     ED Course / MDM  EKG:EKG Interpretation Date/Time:  Saturday June 14 2023 15:13:58 EDT Ventricular Rate:  79 PR Interval:  137 QRS Duration:  91 QT Interval:  402 QTC Calculation: 461 R Axis:   90  Text Interpretation: Sinus rhythm Ventricular premature complex Right atrial enlargement Borderline right axis deviation Nonspecific T abnormalities, lateral leads Artifact Confirmed by Gerhard Munch [phone]) on 06/14/2023 3:16:57 PM  I have reviewed the labs performed to date as well as medications administered while in observation.  Recent changes in the last 24 hours include social work evaluation and patient would require an inpatient stay to qualify for SNF.  She does not meet inpatient criteria for admission.  Plan is for discharge with home health after a physical therapy evaluation.  Home health orders placed.  Of note, patient had transient hypotension that self resolved prior to fluid bolus being  given.  Plan  Current plan is for home health after PT evaluation.    Glendora Score, MD 06/15/23 1120    Glendora Score, MD 06/15/23 1120

## 2023-06-15 NOTE — Progress Notes (Signed)
 PT Cancellation Note  Patient Details Name: [name] MRN: [id number] DOB: [birth year]   Cancelled Treatment:     PT order received but eval deferred this am.  Nursing attempting to establish IV site for fluid bolus with pt BP currently 91/44.  Will follow.   Francenia Chimenti 06/15/2023, 10:31 AM

## 2023-06-15 NOTE — Progress Notes (Addendum)
 Transition of Care [facility]) - Emergency Department Mini Assessment   Patient Details  Name: Anita Price MRN: [id number] Date of Birth: 07-31-1955  Transition of Care [facility]) CM/SW Contact:    Princella Ion, LCSW Phone Number: 06/15/2023, 9:43 AM   Clinical Narrative: CSW spoke with pt at bedside who states she would like to see what her options are and also informed that she needed a ride home. CSW discussed insurance barrier and informed that there is not a need for admission at this time. Patient is agreeable to Home Health and requested that RNCM reach out to her daughter to coordinate. She reports her daughter is available to assist her with certain needs. She states she was discharged from [facility] and went to [employer] with her daughter and feels she "overdid it." She reported pain radiates form her calf to the bottom of her foot which has caused her to be unable to walk. CSW informed a PT eval is not required for [facility] but offered to be seen to be aware of her current ability. Patient agreed noting it would likely be beneficial to know what she is able to do with therapy.  Patient has Medicare A/B without [employer] Waiver and therefore requires a 3 midnight inpatient stay. CSW spoke with EDP and there is not medical necessity for admission at this time. Patient verbalized understanding. EDP has placed PT order. RNCM to outreach to daughter to coordinate [facility].   Addend @ 2:16PM Pt was evaluated by PT. As noted previously, pt not a candidate for SNF due to insurance barrier. She does not meet criteria for inpatient admission. Per chart review, pt was placed into [facility] on 03/17/24 and left two weeks ago. Additionally, pt has used SNF days and has not had a 60 day wellness period to renew bed days. PTAR to transport home.   RNCM has arranged HH and also ordered Wheelchair and BSC.   Case staffed with [facility] on call Supervisor. Since pt does not meet medical admission criteria and has not  had 60 day wellness period to renew bed days, pt should return home with [facility].   ED Mini Assessment: What brought you to the Emergency Department? : weakness  Barriers to Discharge: No Barriers Identified             Patient Contact and Communications Key Contact 1: Patient Key Contact 2: [name] (Daughter)  [phone] (Mobile) Spoke with: Patient Contact Date: 06/15/23,   Contact time: 0930   Call outcome: Patient agreeable to [facility]         Admission diagnosis:  leg weakness Patient Active Problem List   Diagnosis Date Noted   Hypercoagulable state due to paroxysmal atrial fibrillation (HCC) 04/04/2023   Protein-calorie malnutrition, severe 03/17/2023   Pleural effusion 03/16/2023   Hypomagnesemia 03/05/2023   Paroxysmal atrial fibrillation (HCC) 03/04/2023   Paroxysmal atrial flutter (HCC) 03/04/2023   Prolonged Q-T interval on ECG 03/04/2023   Atrial fibrillation and flutter (HCC) 03/03/2023   Pulmonary embolus (HCC) 03/03/2023   Poor appetite 02/24/2023   Medication management 02/24/2023   DNR (do not resuscitate) discussion 02/21/2023   Lactic acidosis 02/18/2023   Malnutrition of moderate degree 02/18/2023   Palliative care encounter 02/18/2023   Goals of care, counseling/discussion 02/18/2023   Counseling and coordination of care 02/18/2023   Need for emotional support 02/18/2023   Sepsis (HCC) 02/18/2023   Hypophosphatemia 02/16/2023   Thrombocytopenia (HCC) 02/15/2023   Abnormal ultrasound of uterus 02/14/2023  Hypokalemia 02/14/2023   Uncomplicated opioid dependence (HCC) 02/14/2023   Empyema (HCC) 02/13/2023   AKI (acute kidney injury) (HCC) 02/13/2023   Acute metabolic encephalopathy 06/30/2021   Diarrhea 06/30/2021   Hepatic cirrhosis in setting of untreated hepatitis C     Essential hypertension 07/10/2020   Hepatitis C 07/10/2020   Tobacco use 07/10/2020   Macrocytosis 07/10/2020   Hyponatremia 07/10/2020   Hyperglycemia 07/10/2020    Prolonged QT interval 07/10/2020   Sepsis due to Streptococcus intermedius empyema (HCC) 07/10/2020   Cervical radiculopathy 12/04/2014   Chronic pain syndrome due to chronic back pain  08/27/2011   PCP:  Jackie Plum, MD Pharmacy:   [facility] [id number] - [address], [place] - [address] [place] [address] Phone: [phone] Fax: [phone]  [facility] [place] [facility] [place], [place] - [address] [place] [address] Phone: [phone] Fax: [phone]

## 2023-06-15 NOTE — Evaluation (Signed)
 Physical Therapy Evaluation Patient Details Name: Anita Price MRN: [id number] DOB: [birth year] Today's Date: 06/15/2023  History of Present Illness  Pt admitted from home with c/o LE weakness/pain and inability to ambulate or self-care.  Pt with hx of DJD, protein malnutrition, chronic back pain s/p back surgery and acute metabolic encephalopathy.  Clinical Impression  Pt admitted as above and presenting with functional mobility limitations 2* LE weakness (R weaker vs L), bil LE pain increased with WB, balance deficits and decreased activity tolerance.  This date, pt up to bedside sitting CGA but with standing requiring RW and min assist of 2 for fall prevention 2* intermittent buckling at knees.  Pt tolerated several small steps only and states unable to continue 2* 10/10 pain.  At current level of function pt would benefit from follow up rehab at SNF level to maximize IND and safety.  For dc home, pt would initially require 24/7 assist of family, follow up HHPT and use of BSC and WC.      If plan is discharge home, recommend the following: A lot of help with walking and/or transfers;A little help with bathing/dressing/bathroom;Assistance with cooking/housework;Assist for transportation;Help with stairs or ramp for entrance   Can travel by private vehicle        Equipment Recommendations Wheelchair (measurements PT);BSC/3in1 (Pt states thinks she has a RW (prior visit indicates yes).  To dc this date, pt would benefit from use of BSC, WC and transport home.)  Recommendations for Other Services  OT consult    Functional Status Assessment Patient has had a recent decline in their functional status and demonstrates the ability to make significant improvements in function in a reasonable and predictable amount of time.     Precautions / Restrictions Precautions Precautions: Fall Restrictions Weight Bearing Restrictions Per Provider Order: No      Mobility  Bed Mobility Overal bed  mobility: Needs Assistance Bed Mobility: Supine to Sit, Sit to Supine     Supine to sit: Contact guard Sit to supine: Contact guard assist   General bed mobility comments: for safety only 2* pt position on high gurney    Transfers Overall transfer level: Needs assistance Equipment used: Rolling walker (2 wheels) Transfers: Sit to/from Stand Sit to Stand: Min assist, +2 physical assistance, +2 safety/equipment, From elevated surface           General transfer comment: cues for safety, use of UEs to self assist.  Physical assist to balance in standing with RW and with mild intermittent buckling noted at knees    Ambulation/Gait Ambulation/Gait assistance: Min assist, +2 physical assistance, +2 safety/equipment Gait Distance (Feet): 2 Feet Assistive device: Rolling walker (2 wheels) Gait Pattern/deviations: Step-to pattern, Decreased step length - right, Decreased step length - left, Shuffle, Trunk flexed Gait velocity: decr     General Gait Details: Pt tolerated one step fwd and back and side shuffle alone side of bed with RW.  Distance ltd by pt report of pain and intermitent mild buckling at knees (R>L)  Stairs            Wheelchair Mobility     Tilt Bed    Modified Rankin (Stroke Patients Only)       Balance Overall balance assessment: Needs assistance Sitting-balance support: Feet unsupported, No upper extremity supported Sitting balance-Leahy Scale: Good     Standing balance support: Bilateral upper extremity supported Standing balance-Leahy Scale: Poor  Pertinent Vitals/Pain Pain Assessment Pain Assessment: 0-10 Pain Score: 10-Worst pain ever Pain Location: 9/10 R calf at rest; 10/10 bil calfs with standing Pain Descriptors / Indicators: Grimacing, Guarding, Aching Pain Intervention(s): Limited activity within patient's tolerance, Monitored during session, Patient requesting pain meds-RN notified     Home Living Family/patient expects to be discharged to:: Private residence Living Arrangements: Alone Available Help at Discharge: Family;Available 24 hours/day Type of Home: Apartment Home Access: Stairs to enter   Entrance Stairs-Number of Steps: 2   Home Layout: One level Home Equipment: Agricultural consultant (2 wheels)      Prior Function Prior Level of Function : Independent/Modified Independent             Mobility Comments: Pt states amulating unassisted at time of recent dc from [facility] rehab       Extremity/Trunk Assessment   Upper Extremity Assessment Upper Extremity Assessment: Overall WFL for tasks assessed    Lower Extremity Assessment Lower Extremity Assessment: Generalized weakness;RLE deficits/detail RLE Deficits / Details: No active toe movement or DF but DF to neutral PROM with no noted increase in pain.  Strength testing at hip and knee very inconsistent with pt initially states unable to move but then bends knee to 90 unassisted; unable to life leg but able to hold in place unassisted if positioned in SLR.    Cervical / Trunk Assessment Cervical / Trunk Assessment: Normal  Communication   Communication Communication: No apparent difficulties    Cognition Arousal: Alert Behavior During Therapy: WFL for tasks assessed/performed   PT - Cognitive impairments: No apparent impairments                         Following commands: Intact       Cueing Cueing Techniques: Verbal cues     General Comments      Exercises     Assessment/Plan    PT Assessment Patient needs continued PT services  PT Problem List Decreased strength;Decreased range of motion;Decreased activity tolerance;Decreased balance;Decreased mobility;Decreased knowledge of use of DME;Decreased safety awareness;Pain       PT Treatment Interventions DME instruction;Gait training;Stair training;Functional mobility training;Therapeutic activities;Therapeutic  exercise;Balance training;Patient/family education    PT Goals (Current goals can be found in the Care Plan section)  Acute Rehab PT Goals Patient Stated Goal: Less pain, regain IND PT Goal Formulation: With patient Time For Goal Achievement: 06/29/23 Potential to Achieve Goals: Good    Frequency Min 3X/week     Co-evaluation               AM-PAC PT "6 Clicks" Mobility  Outcome Measure Help needed turning from your back to your side while in a flat bed without using bedrails?: A Little Help needed moving from lying on your back to sitting on the side of a flat bed without using bedrails?: A Little Help needed moving to and from a bed to a chair (including a wheelchair)?: A Little Help needed standing up from a chair using your arms (e.g., wheelchair or bedside chair)?: A Little Help needed to walk in hospital room?: Total Help needed climbing 3-5 steps with a railing? : Total 6 Click Score: 14    End of Session Equipment Utilized During Treatment: Gait belt Activity Tolerance: Patient limited by pain Patient left: in bed;with call bell/phone within reach Nurse Communication: Mobility status PT Visit Diagnosis: Unsteadiness on feet (R26.81);Muscle weakness (generalized) (M62.81);Difficulty in walking, not elsewhere classified (R26.2);Pain Pain - part of  body: Leg (bilat)    Time: 1610-9604 PT Time Calculation (min) (ACUTE ONLY): 26 min   Charges:   PT Evaluation $PT Eval Moderate Complexity: 1 Mod PT Treatments $Therapeutic Activity: 8-22 mins PT General Charges $$ ACUTE PT VISIT: 1 Visit         Mauro Kaufmann PT Acute Rehabilitation Services Pager [phone] Office [phone]   Carron Jaggi 06/15/2023, 1:05 PM

## 2023-06-15 NOTE — Care Management (Signed)
    Durable Medical Equipment  (From admission, onward)           Start     Ordered   06/15/23 1422  For home use only DME Bedside commode  Once       Question:  Patient needs a bedside commode to treat with the following condition  Answer:  Weakness   06/15/23 1422   06/15/23 1419  For home use only DME lightweight manual wheelchair with seat cushion  Once       Comments: Patient suffers from lower extremity weaknesswhich impairs their ability to perform daily activities like toileting in the home.  A walker will not resolve  issue with performing activities of daily living. A wheelchair will allow patient to safely perform daily activities. Patient is not able to propel themselves in the home using a standard weight wheelchair due to general weakness. Patient can self propel in the lightweight wheelchair. Length of need Lifetime. Accessories: elevating leg rests (ELRs), wheel locks, extensions and anti-tippers.   06/15/23 1422

## 2023-06-18 ENCOUNTER — Other Ambulatory Visit: Payer: Self-pay

## 2023-06-18 ENCOUNTER — Emergency Department (HOSPITAL_COMMUNITY)
Admission: EM | Admit: 2023-06-18 | Discharge: 2023-06-18 | Disposition: A | Discharge status: Home / Self Care | Attending: Emergency Medicine | Admitting: Emergency Medicine

## 2023-06-18 ENCOUNTER — Encounter (HOSPITAL_COMMUNITY): Payer: Self-pay | Admitting: Emergency Medicine

## 2023-06-18 ENCOUNTER — Emergency Department (HOSPITAL_COMMUNITY)

## 2023-06-18 DIAGNOSIS — R531 Weakness: Secondary | ICD-10-CM | POA: Diagnosis present

## 2023-06-18 DIAGNOSIS — Z7901 Long term (current) use of anticoagulants: Secondary | ICD-10-CM | POA: Insufficient documentation

## 2023-06-18 DIAGNOSIS — I10 Essential (primary) hypertension: Secondary | ICD-10-CM | POA: Insufficient documentation

## 2023-06-18 NOTE — Discharge Instructions (Addendum)
 Call your pcp to schedule close follow-up appointment for management of your chronic health conditions. Hopefully home health and physical therapy can help you get back on your feet again  Return if development of any new or worsening symptoms.

## 2023-06-18 NOTE — ED Notes (Signed)
 PTAR has been scheduled for the patient to return home.  She is the 3rd patient on the list for pick-up.

## 2023-06-18 NOTE — ED Provider Notes (Signed)
 [place] EMERGENCY DEPARTMENT AT [facility] Provider Note   CSN: [id number] Arrival date & time: 06/18/23  1331     History  Chief Complaint  Patient presents with   Weakness    CHEROKEE BOCCIO is a 68 y.o. female.  Patient with history of chronic back pain, hypertension, hep C presents today with complaints of weakness. She was just seen for these symptoms and had negative work-up including MRI imaging. She stated at that time that she could not go home and so she boarded for placement. However, due to an insurance issue, the best social work could get for her was home health PT/OT. She was discharged home on 3/16. Patient states that since then she has been sliding around on the ground due to her inability to walk.  Physical therapy arrived at her house today for a session today and found her on the ground and she asked them to call EMS to take her back to the hospital for placement.  She presents for same.  She did not fall and denies any changes in her condition since she was discharged. Pain and weakness in the right leg again unchanged. No loss of bowel or bladder function or saddle paraesthesias.   The history is provided by the patient. No language interpreter was used.  Weakness      Home Medications Prior to Admission medications   Medication Sig Start Date End Date Taking? Authorizing Provider  Acetaminophen (TYLENOL PO) Take 3 tablets by mouth daily as needed.    [provider]  apixaban (ELIQUIS) 5 MG TABS tablet Take 1 tablet (5 mg total) by mouth 2 (two) times daily. 03/06/23   Marinda Elk, MD  diclofenac Sodium (VOLTAREN) 1 % GEL Apply 2 g topically 4 (four) times daily.    [provider]  Lactulose 20 GM/30ML SOLN Take 30 mLs (20 g total) by mouth 2 (two) times daily as needed (Please give if no 1-2 BMs per day). 03/18/23   Elgergawy, Leana Roe, MD  oxyCODONE (OXY IR/ROXICODONE) 5 MG immediate release tablet Take 0.5 tablets (2.5  mg total) by mouth every 6 (six) hours as needed for moderate pain (pain score 4-6) or breakthrough pain. 03/18/23   Elgergawy, Leana Roe, MD  pantoprazole (PROTONIX) 40 MG tablet Take 1 tablet (40 mg total) by mouth daily. 03/07/23   Marinda Elk, MD  pregabalin (LYRICA) 300 MG capsule Take 300 mg by mouth in the morning and at bedtime. 01/03/20   [provider]  propranolol (INDERAL) 40 MG tablet Take 1 tablet (40 mg total) by mouth 3 (three) times daily. 03/06/23   Marinda Elk, MD      Allergies    Celecoxib, Duloxetine hcl, Tizanidine, Venlafaxine, Effersyllium [psyllium], Escitalopram, Neurontin [gabapentin], and Tramadol    Review of Systems   Review of Systems  Neurological:  Positive for weakness.  All other systems reviewed and are negative.   Physical Exam Updated Vital Signs BP (!) 156/78 (BP Location: Right Arm)   Pulse 64   Temp 98 F (36.7 C) (Oral)   Resp 18   SpO2 100%  Physical Exam Vitals and nursing note reviewed.  Constitutional:      General: She is not in acute distress.    Appearance: Normal appearance. She is normal weight. She is not ill-appearing, toxic-appearing or diaphoretic.  HENT:     Head: Normocephalic and atraumatic.  Cardiovascular:     Rate and Rhythm: Normal rate.  Pulmonary:     Effort: Pulmonary effort is normal. No respiratory distress.  Musculoskeletal:        General: Normal range of motion.     Cervical back: Normal range of motion.     Comments: No tenderness to palpation of the cervical, thoracic, or lumbar spine. No stepoffs, lesions, deformity, or overlying skin changes.    RLE strength 3/5. Good sensation. DP and PT pulses intact and 2+. Able to lift her leg off the bed with effort. Does have generalized tenderness to palpation throughout the lower leg. No erythema, warmth, fluctuance, induration, or overlying skin changes. No palpable cord. Negative homans sign.  Skin:    General: Skin is warm and dry.   Neurological:     General: No focal deficit present.     Mental Status: She is alert.  Psychiatric:        Mood and Affect: Mood normal.        Behavior: Behavior normal.     ED Results / Procedures / Treatments   Labs (all labs ordered are listed, but only abnormal results are displayed) Labs Reviewed - No data to display   EKG None  Radiology No results found.  Procedures Procedures    Medications Ordered in ED Medications - No data to display  ED Course/ Medical Decision Making/ A&P                                 Medical Decision Making Amount and/or Complexity of Data Reviewed Labs: ordered.   Patient presents today with complaints of weakness. She is afebrile, non-toxic appearing, and in no acute distress with reassuring vital signs. Physical exam reveals 3/5 strength in RLE with good distal pulses and sensation intact. Chart reviewed, consistent with when she was seen a few days ago and had MRI brain that was negative and lumbar spine that showed chronic degenerative changes but nothing requiring acute intervention. Patient notes repeatedly that nothing changed today to prompt her visit. Initially requesting to board for SNF placement, however upon chart review looks like she was just here for this and was seen by social work who deemed that per her insurance she had used up all her SNF days for the year and therefore was discharged with home health and PT/OT. Patient initially states that she can not care for herself at home and there isnt anyone in her family who can help her. Therefore plan was to get basic screening labs and reconsult social work to see if there was anything else to offer. However, soon after initial conversation, patient states that she just thought about her daughter in law and called her and she states that she is going to come and help her. Patient states she thinks she can manage at home with this assistance and would like to be discharged now.  Social work also reached out to me to confirm that home health PT/OT is all that she qualifies for with her insurance. Discussed same with patient. She wants to go home. No indication for repeat labs/imaging at this time. Evaluation and diagnostic testing in the emergency department does not suggest an emergent condition requiring admission or immediate intervention beyond what has been performed at this time.  Plan for discharge with close PCP follow-up.  Patient is understanding and amenable with plan, educated on red flag symptoms that would prompt immediate return.  Patient discharged in stable condition.  Findings and  plan of care discussed with supervising physician Dr. Renaye Rakers who is in agreement.   Final Clinical Impression(s) / ED Diagnoses Final diagnoses:  Weakness    Rx / DC Orders ED Discharge Orders     None     An After Visit Summary was printed and given to the patient.     Vear Clock 06/18/23 1734    Terald Sleeper, MD 06/18/23 [phone]

## 2023-06-18 NOTE — Progress Notes (Signed)
 [facility] [id number] Liaison Note  This patient is enrolled in [facility] program. In the absence of any acute needs, we are able to provide follow up care such as xrays, blood work, etc in the home.  Please call with any questions or concerns.  Thank you, Haynes Bast, BSN, [facility] [phone]

## 2023-06-18 NOTE — ED Triage Notes (Signed)
 Pt BIB by [facility] for generalized weakness and deconditioning. EMS called by PT when pt was found in the floor. Per EMS, pt states her daughter is no longer willing to help with ADLs. Denies any pain.

## 2023-06-25 ENCOUNTER — Other Ambulatory Visit: Payer: Self-pay

## 2023-06-25 ENCOUNTER — Emergency Department (HOSPITAL_COMMUNITY)
Admission: EM | Admit: 2023-06-25 | Discharge: 2023-06-25 | Disposition: A | Discharge status: Home / Self Care | Attending: Emergency Medicine | Admitting: Emergency Medicine

## 2023-06-25 ENCOUNTER — Encounter (HOSPITAL_COMMUNITY): Payer: Self-pay

## 2023-06-25 ENCOUNTER — Emergency Department (HOSPITAL_BASED_OUTPATIENT_CLINIC_OR_DEPARTMENT_OTHER)

## 2023-06-25 DIAGNOSIS — N390 Urinary tract infection, site not specified: Secondary | ICD-10-CM | POA: Insufficient documentation

## 2023-06-25 DIAGNOSIS — R531 Weakness: Secondary | ICD-10-CM | POA: Insufficient documentation

## 2023-06-25 DIAGNOSIS — R29898 Other symptoms and signs involving the musculoskeletal system: Secondary | ICD-10-CM

## 2023-06-25 DIAGNOSIS — M79604 Pain in right leg: Secondary | ICD-10-CM | POA: Diagnosis present

## 2023-06-25 DIAGNOSIS — M79661 Pain in right lower leg: Secondary | ICD-10-CM

## 2023-06-25 DIAGNOSIS — Z7901 Long term (current) use of anticoagulants: Secondary | ICD-10-CM | POA: Diagnosis not present

## 2023-06-25 LAB — CBC WITH DIFFERENTIAL/PLATELET
Abs Immature Granulocytes: 0.03 10*3/uL (ref 0.00–0.07)
Basophils Absolute: 0 10*3/uL (ref 0.0–0.1)
Basophils Relative: 1 %
Eosinophils Absolute: 0.1 10*3/uL (ref 0.0–0.5)
Eosinophils Relative: 1 %
HCT: 42.3 % (ref 36.0–46.0)
Hemoglobin: 14.1 g/dL (ref 12.0–15.0)
Immature Granulocytes: 0 %
Lymphocytes Relative: 23 %
Lymphs Abs: 1.7 10*3/uL (ref 0.7–4.0)
MCH: 30.3 pg (ref 26.0–34.0)
MCHC: 33.3 g/dL (ref 30.0–36.0)
MCV: 91 fL (ref 80.0–100.0)
Monocytes Absolute: 0.6 10*3/uL (ref 0.1–1.0)
Monocytes Relative: 7 %
Neutro Abs: 5 10*3/uL (ref 1.7–7.7)
Neutrophils Relative %: 68 %
Platelets: 160 10*3/uL (ref 150–400)
RBC: 4.65 MIL/uL (ref 3.87–5.11)
RDW: 14.2 % (ref 11.5–15.5)
WBC: 7.4 10*3/uL (ref 4.0–10.5)
nRBC: 0 % (ref 0.0–0.2)

## 2023-06-25 LAB — COMPREHENSIVE METABOLIC PANEL
ALT: 39 U/L (ref 0–44)
AST: 67 U/L — ABNORMAL HIGH (ref 15–41)
Albumin: 3.6 g/dL (ref 3.5–5.0)
Alkaline Phosphatase: 88 U/L (ref 38–126)
Anion gap: 12 (ref 5–15)
BUN: 9 mg/dL (ref 8–23)
CO2: 23 mmol/L (ref 22–32)
Calcium: 8.8 mg/dL — ABNORMAL LOW (ref 8.9–10.3)
Chloride: 106 mmol/L (ref 98–111)
Creatinine, Ser: 0.67 mg/dL (ref 0.44–1.00)
GFR, Estimated: >60 mL/min (ref >60)
Glucose, Bld: 158 mg/dL — ABNORMAL HIGH (ref 70–99)
Potassium: 3.3 mmol/L — ABNORMAL LOW (ref 3.5–5.1)
Sodium: 141 mmol/L (ref 135–145)
Total Bilirubin: 0.7 mg/dL (ref 0.0–1.2)
Total Protein: 6.9 g/dL (ref 6.5–8.1)

## 2023-06-25 LAB — URINALYSIS, ROUTINE W REFLEX MICROSCOPIC
Bilirubin Urine: NEGATIVE
Glucose, UA: NEGATIVE mg/dL
Hgb urine dipstick: NEGATIVE
Ketones, ur: NEGATIVE mg/dL
Nitrite: POSITIVE — AB
Protein, ur: NEGATIVE mg/dL
Specific Gravity, Urine: 1.014 (ref 1.005–1.030)
WBC, UA: >50 WBC/hpf (ref 0–5)
pH: 6 (ref 5.0–8.0)

## 2023-06-25 LAB — CK: Total CK: 37 U/L — ABNORMAL LOW (ref 38–234)

## 2023-06-25 MED ORDER — MORPHINE SULFATE (PF) 4 MG/ML IV SOLN
4.0000 mg | Freq: Once | INTRAVENOUS | Status: AC
  Administered 2023-06-25: 4 mg via INTRAMUSCULAR
  Filled 2023-06-25: qty 1

## 2023-06-25 MED ORDER — CEPHALEXIN 500 MG PO CAPS: 500.0000 mg | ORAL_CAPSULE | Freq: Four times a day (QID) | ORAL | 0 refills | Status: DC

## 2023-06-25 MED ORDER — DICLOFENAC SODIUM 1 % EX GEL
2.0000 g | Freq: Four times a day (QID) | CUTANEOUS | 0 refills | Status: AC
Start: 2023-06-25 — End: ?

## 2023-06-25 NOTE — ED Provider Triage Note (Signed)
 Emergency Medicine Provider Triage Evaluation Note  Anita Price , a 68 y.o. female  was evaluated in triage.  Pt complains of right leg pain. She reports that it starts at her lower back and radiates down her leg into her foot. She has chronic lower leg weakness on the right since Christmas. She has been seen for this issue. Her and family are requesting that she be placed in facility as she can not take care of herself or walk. No change in her status from her last visit..  Review of Systems  Positive:  Negative:   Physical Exam  BP (!) 140/105 (BP Location: Left Arm)   Pulse 82   Temp 98.1 F (36.7 C) (Oral)   Resp 16   Ht 5\' 5"  (1.651 m)   Wt 37.4 kg   SpO2 99%   BMI 13.72 kg/m  Gen:   Awake, no distress   Resp:  Normal effort  MSK:   Moves extremities without difficulty  Other:  Weakness in the right lower leg. Palpable pulse. Coloration appears symmetric. 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 This is chronic for the past few months per patient. Sensation reportedly decrease but at chronic state. Compartments soft. Tenderness to right SI.   Medical Decision Making  Medically screening exam initiated at 3:32 PM.  Appropriate orders placed.  [name] was informed that the remainder of the evaluation will be completed by another provider, this initial triage assessment does not replace that evaluation, and the importance of remaining in the ED until their evaluation is complete.  Patient has been seen multiple times over the past few weeks for similar complaints. She has had an MRI that did not show any acute surgical process. Her insurance was limited and she was able to get PT/OT at home but not in a facility. Will order DVT study given patients sedentary lifestyle.    Achille Rich, [place] 06/25/23 1537

## 2023-06-25 NOTE — Discharge Instructions (Addendum)
 Please call and follow-up closely with urology for further evaluation of your leg weakness.  You may benefit from a nerve conduction study.  Previously your evaluated for your symptoms.  An MRI of your brain as well as MRI of your lower back did not show any concerning finding.  No signs of acute stroke.  Discussed with your doctor for option of further physical therapy and Occupational Therapy for rehab as well as potential placement to a rehab facility.  Your urine shows signs of urinary tract infection.  Take antibiotic as prescribed.

## 2023-06-25 NOTE — ED Provider Notes (Signed)
 [place] EMERGENCY DEPARTMENT AT [facility] Provider Note   CSN: [id number] Arrival date & time: 06/25/23  1257     History  Chief Complaint  Patient presents with   Leg Pain    Anita Price is a 68 y.o. female.  The history is provided by the patient and a significant other. No language interpreter was used.  Leg Pain    Patient is a 68 year old female with PMH of chronic pain syndrome previously managed by [facility] who presents for right lower leg pain. Her daughter accompanies her for the encounter. Patient states that her leg pain began in February, her daughter says November. She states that the pain starts at the top of her hip and runs all the way down to her foot and sometimes it will begin mid thigh and run down to her foot as well. She characterizes the pain as aching and throbbing, aggravated by bending her knees, walking, standing. States she has not been able to walk since February. She reports trying tylenol, gabapentin, pregabalin to help manage the pain but without relief. Previously was in a rehab center for the same leg pain but was recently discharged at an unknown date. Daughter states that she needs 24/7 care access as the patient cannot adequately perform ADLs.   Home Medications Prior to Admission medications   Medication Sig Start Date End Date Taking? Authorizing Provider  Acetaminophen (TYLENOL PO) Take 3 tablets by mouth daily as needed.    [provider]  apixaban (ELIQUIS) 5 MG TABS tablet Take 1 tablet (5 mg total) by mouth 2 (two) times daily. 03/06/23   Marinda Elk, MD  diclofenac Sodium (VOLTAREN) 1 % GEL Apply 2 g topically 4 (four) times daily.    [provider]  Lactulose 20 GM/30ML SOLN Take 30 mLs (20 g total) by mouth 2 (two) times daily as needed (Please give if no 1-2 BMs per day). 03/18/23   Elgergawy, Leana Roe, MD  oxyCODONE (OXY IR/ROXICODONE) 5 MG immediate release tablet Take 0.5  tablets (2.5 mg total) by mouth every 6 (six) hours as needed for moderate pain (pain score 4-6) or breakthrough pain. 03/18/23   Elgergawy, Leana Roe, MD  pantoprazole (PROTONIX) 40 MG tablet Take 1 tablet (40 mg total) by mouth daily. 03/07/23   Marinda Elk, MD  pregabalin (LYRICA) 300 MG capsule Take 300 mg by mouth in the morning and at bedtime. 01/03/20   [provider]  propranolol (INDERAL) 40 MG tablet Take 1 tablet (40 mg total) by mouth 3 (three) times daily. 03/06/23   Marinda Elk, MD      Allergies    Celecoxib, Duloxetine hcl, Tizanidine, Venlafaxine, Effersyllium [psyllium], Escitalopram, Neurontin [gabapentin], and Tramadol    Review of Systems   Review of Systems  All other systems reviewed and are negative.   Physical Exam Updated Vital Signs BP (!) 140/81 (BP Location: Right Arm)   Pulse 84   Temp 98.5 F (36.9 C) (Oral)   Resp 16   Ht 5\' 5"  (1.651 m)   Wt 37.4 kg   SpO2 100%   BMI 13.72 kg/m  Physical Exam Vitals and nursing note reviewed.  Constitutional:      General: She is not in acute distress.    Appearance: She is well-developed.     Comments: Anita Price female appears older than stated age.  She is sitting in the chair appears to be in no acute discomfort.  Significant alopecia noted  HENT:     Head: Atraumatic.  Eyes:     Conjunctiva/sclera: Conjunctivae normal.  Pulmonary:     Effort: Pulmonary effort is normal.  Abdominal:     Palpations: Abdomen is soft.     Tenderness: There is no abdominal tenderness. There is no right CVA tenderness or left CVA tenderness.  Musculoskeletal:        General: Tenderness (Mild generalized tenderness noted to palpation of entire right leg.  There are some muscle wasting but seems to be symmetrical.  Pedal pulse palpable.  Difficulty with dorsiflexion of the foot.  Strength diminished compared to left) present.     Cervical back: Neck supple.  Skin:    Findings: No rash.   Neurological:     Mental Status: She is alert.  Psychiatric:        Mood and Affect: Mood normal.     ED Results / Procedures / Treatments   Labs (all labs ordered are listed, but only abnormal results are displayed) Labs Reviewed  COMPREHENSIVE METABOLIC PANEL - Abnormal; Notable for the following components:      Result Value   Potassium 3.3 (*)    Glucose, Bld 158 (*)    Calcium 8.8 (*)    AST 67 (*)    All other components within normal limits  URINALYSIS, ROUTINE W REFLEX MICROSCOPIC - Abnormal; Notable for the following components:   APPearance CLOUDY (*)    Nitrite POSITIVE (*)    Leukocytes,Ua MODERATE (*)    Bacteria, UA RARE (*)    All other components within normal limits  CK - Abnormal; Notable for the following components:   Total CK 37 (*)    All other components within normal limits  CBC WITH DIFFERENTIAL/PLATELET    EKG None  Radiology VAS [place] LOWER EXTREMITY VENOUS (DVT) (ONLY [facility] & [facility]) Result Date: 06/25/2023  Lower Venous DVT Study Patient Name:  Anita Price  Date of Exam:   06/25/2023 Medical Rec #: [id number]       Accession #:    [id number] Date of Birth: Jun 23, 1955        Patient Gender: F Patient Age:   52 years Exam Location:  [facility] Procedure:      VAS [place] LOWER EXTREMITY VENOUS (DVT) Referring Phys: RILEY RANSOM --------------------------------------------------------------------------------  Indications: Pain.  Risk Factors: Past pregnancy. Limitations: Patient pain. Comparison Study: None. Performing Technologist: Shona Simpson  Examination Guidelines: A complete evaluation includes B-mode imaging, spectral Doppler, color Doppler, and power Doppler as needed of all accessible portions of each vessel. Bilateral testing is considered an integral part of a complete examination. Limited examinations for reoccurring indications may be performed as noted. The reflux portion of the exam is performed with the patient in reverse Trendelenburg.   +---------+---------------+---------+-----------+----------+--------------+ RIGHT    CompressibilityPhasicitySpontaneityPropertiesThrombus Aging +---------+---------------+---------+-----------+----------+--------------+ CFV      Full           Yes      Yes                                 +---------+---------------+---------+-----------+----------+--------------+ SFJ      Full                                                        +---------+---------------+---------+-----------+----------+--------------+  FV Prox  Full                                                        +---------+---------------+---------+-----------+----------+--------------+ FV Mid   Full                                                        +---------+---------------+---------+-----------+----------+--------------+ FV DistalFull                                                        +---------+---------------+---------+-----------+----------+--------------+ PFV      Full                                                        +---------+---------------+---------+-----------+----------+--------------+ POP      Full           Yes      Yes                                 +---------+---------------+---------+-----------+----------+--------------+ PTV      Full                                                        +---------+---------------+---------+-----------+----------+--------------+ PERO     Full                                                        +---------+---------------+---------+-----------+----------+--------------+     Summary: RIGHT: - There is no evidence of deep vein thrombosis in the lower extremity.  - No cystic structure found in the popliteal fossa.   *See table(s) above for measurements and observations.    Preliminary     Procedures Procedures    Medications Ordered in ED Medications  morphine (PF) 4 MG/ML injection 4 mg (4 mg Intramuscular  Given 06/25/23 1928)    ED Course/ Medical Decision Making/ A&P                                 Medical Decision Making  BP (!) 140/81 (BP Location: Right Arm)   Pulse 84   Temp 98.5 F (36.9 C) (Oral)   Resp 16   Ht 5\' 5"  (1.651 m)   Wt 37.4 kg   SpO2 100%   BMI 13.72 kg/m   7:26 PM  Patient is a 68 year old female with PMH of chronic  pain syndrome previously managed by [facility] who presents for right lower leg pain. Her daughter accompanies her for the encounter. Patient states that her leg pain began in February, her daughter says November. She states that the pain starts at the top of her hip and runs all the way down to her foot and sometimes it will begin mid thigh and run down to her foot as well. She characterizes the pain as aching and throbbing, aggravated by bending her knees, walking, standing. States she has not been able to walk since February. She reports trying tylenol, gabapentin, pregabalin to help manage the pain but without relief. Previously was in a rehab center for the same leg pain but was recently discharged at an unknown date. Daughter states that she needs 24/7 care access as the patient cannot adequately perform ADLs.   On exam this is a frail-appearing female sitting in the bed appears to be in no acute discomfort.  Exam notable for weakness of the right lower extremity as compared to left with symmetric muscle wasting.  Pedal pulses palpable.  However patient is having difficulty with dorsiflexion of her right foot compared to left.  Foot drop is noted.  Patellar deep tendon reflex is intact.  Patient able to flex at the hip.  She does not have any significant lumbar spine tenderness.  -Labs ordered, independently viewed and interpreted by me.  Labs remarkable for UA positive for positive nitrite and moderate leukocyte along with greater than 50 WBC.  Patient denies any urinary symptoms but given her medical condition, will prescribe antibiotic,  Keflex.  Normal WBC normal H&H electrolyte panels are reassuring. -The patient was maintained on a cardiac monitor.  I personally viewed and interpreted the cardiac monitored which showed an underlying rhythm of: Normal sinus rhythm -Imaging independently viewed and interpreted by me and I agree with radiologist's interpretation.  Result remarkable for vascular ultrasounds of the right lower extremity was obtained and is negative for DVT -This patient presents to the ED for concern of leg pain, this involves an extensive number of treatment options, and is a complaint that carries with it a high risk of complications and morbidity.  The differential diagnosis includes myositis, stroke, cauda equina, radicular pain, DVT -Co morbidities that complicate the patient evaluation includes chronic pain -Treatment includes morphine -Reevaluation of the patient after these medicines showed that the patient improved -PCP office notes or outside notes reviewed -Discussion with attending Dr Lockie Mola -Escalation to admission/observation considered: patients felt OK, is comfortable with discharge, and will follow up with neurology -Prescription medication considered, patient comfortable with keflex, voltaren gel -Social Determinant of Health considered   I felt patient may benefit from close follow-up with her PCP as well as neurology.  She may benefit from nerve conduction study to identify the cause of her leg weakness.  She has had a brain MRI as well as MRI of her lumbar spine done 10 days ago without acute finding.  Doubt stroke or cauda equina causing her symptoms.  She is able to flex and extend the leg but she does have notable weakness in her right leg as compared to left.  This will need to be investigated further outpatient but at this time she is vascular intact.  No signs of infection, except for UA showing signs of UTI for which she will receive treatment.         Final Clinical Impression(s) /  ED Diagnoses Final diagnoses:  Right leg pain  Right  leg weakness  Acute lower UTI    Rx / DC Orders ED Discharge Orders          Ordered    cephALEXin (KEFLEX) 500 MG capsule  4 times daily        06/25/23 1934    diclofenac Sodium (VOLTAREN) 1 % GEL  4 times daily        06/25/23 1934              Fayrene Helper, PA-C 06/25/23 1937    Virgina Norfolk, DO 06/25/23 2044

## 2023-06-25 NOTE — Progress Notes (Signed)
 Lower extremity venous duplex completed. Please see CV Procedures for preliminary results.  Shona Simpson, RVT 06/25/23 4:22 PM

## 2023-06-25 NOTE — Progress Notes (Signed)
 [facility] Liaison Note  Notified of patients arrival in ED. This patient is currently enrolled in [facility] Based Primary Care program.  Phone call with daughter, Efraim Kaufmann, who shares that patient needs placement in LTC, she is unable to care for her at patients home and patient is not ambulatory. Patient does not have funds for LTC placement and will need to apply for Medicaid. Discussion with daughter if patient could return to daughters home while completing Medicaid application and awaiting Medicaid approval. [facility] could assist with Medicaid application and finding LTC placement, rather than patient remaining in ED.  Melissa, agrees to discuss with her husband this evening patient coming to their home as above. She agrees for [place] to follow up March 27th by 6pm.  Please call for any questions or concerns.  Thank you, Haynes Bast, BSN, [facility]

## 2023-06-25 NOTE — ED Triage Notes (Signed)
 BIB EMS from home for chronic right leg pain. Pt was released from rehab a few weeks ago and she has been laying in the floor and refuses to get up and take care of herself because of the pain.

## 2023-07-08 ENCOUNTER — Emergency Department (HOSPITAL_COMMUNITY)
Admission: EM | Admit: 2023-07-08 | Discharge: 2023-08-07 | Disposition: A | Discharge status: Skilled Nursing Facility | Attending: Emergency Medicine | Admitting: Emergency Medicine

## 2023-07-08 ENCOUNTER — Encounter (HOSPITAL_COMMUNITY): Payer: Self-pay

## 2023-07-08 DIAGNOSIS — G8929 Other chronic pain: Secondary | ICD-10-CM | POA: Insufficient documentation

## 2023-07-08 DIAGNOSIS — Z7409 Other reduced mobility: Secondary | ICD-10-CM | POA: Insufficient documentation

## 2023-07-08 DIAGNOSIS — I10 Essential (primary) hypertension: Secondary | ICD-10-CM | POA: Diagnosis not present

## 2023-07-08 DIAGNOSIS — R7309 Other abnormal glucose: Secondary | ICD-10-CM | POA: Insufficient documentation

## 2023-07-08 DIAGNOSIS — M79604 Pain in right leg: Secondary | ICD-10-CM | POA: Insufficient documentation

## 2023-07-08 DIAGNOSIS — E876 Hypokalemia: Secondary | ICD-10-CM | POA: Diagnosis not present

## 2023-07-08 DIAGNOSIS — Z789 Other specified health status: Secondary | ICD-10-CM

## 2023-07-08 DIAGNOSIS — Z7901 Long term (current) use of anticoagulants: Secondary | ICD-10-CM | POA: Insufficient documentation

## 2023-07-08 DIAGNOSIS — Z720 Tobacco use: Secondary | ICD-10-CM | POA: Insufficient documentation

## 2023-07-08 LAB — COMPREHENSIVE METABOLIC PANEL WITH GFR
ALT: 35 U/L (ref 0–44)
AST: 53 U/L — ABNORMAL HIGH (ref 15–41)
Albumin: 3.2 g/dL — ABNORMAL LOW (ref 3.5–5.0)
Alkaline Phosphatase: 69 U/L (ref 38–126)
Anion gap: 10 (ref 5–15)
BUN: 21 mg/dL (ref 8–23)
CO2: 20 mmol/L — ABNORMAL LOW (ref 22–32)
Calcium: 8.3 mg/dL — ABNORMAL LOW (ref 8.9–10.3)
Chloride: 107 mmol/L (ref 98–111)
Creatinine, Ser: 0.6 mg/dL (ref 0.44–1.00)
GFR, Estimated: >60 mL/min (ref >60)
Glucose, Bld: 111 mg/dL — ABNORMAL HIGH (ref 70–99)
Potassium: 3.4 mmol/L — ABNORMAL LOW (ref 3.5–5.1)
Sodium: 137 mmol/L (ref 135–145)
Total Bilirubin: 0.8 mg/dL (ref 0.0–1.2)
Total Protein: 6.1 g/dL — ABNORMAL LOW (ref 6.5–8.1)

## 2023-07-08 LAB — CBC WITH DIFFERENTIAL/PLATELET
Abs Immature Granulocytes: 0.03 10*3/uL (ref 0.00–0.07)
Basophils Absolute: 0 10*3/uL (ref 0.0–0.1)
Basophils Relative: 1 %
Eosinophils Absolute: 0.2 10*3/uL (ref 0.0–0.5)
Eosinophils Relative: 2 %
HCT: 41.4 % (ref 36.0–46.0)
Hemoglobin: 14.1 g/dL (ref 12.0–15.0)
Immature Granulocytes: 0 %
Lymphocytes Relative: 36 %
Lymphs Abs: 2.5 10*3/uL (ref 0.7–4.0)
MCH: 31 pg (ref 26.0–34.0)
MCHC: 34.1 g/dL (ref 30.0–36.0)
MCV: 91 fL (ref 80.0–100.0)
Monocytes Absolute: 0.6 10*3/uL (ref 0.1–1.0)
Monocytes Relative: 8 %
Neutro Abs: 3.7 10*3/uL (ref 1.7–7.7)
Neutrophils Relative %: 53 %
Platelets: 156 10*3/uL (ref 150–400)
RBC: 4.55 MIL/uL (ref 3.87–5.11)
RDW: 14 % (ref 11.5–15.5)
WBC: 7.1 10*3/uL (ref 4.0–10.5)
nRBC: 0 % (ref 0.0–0.2)

## 2023-07-08 MED ORDER — ACETAMINOPHEN 500 MG PO TABS
500.0000 mg | ORAL_TABLET | Freq: Once | ORAL | Status: AC
  Administered 2023-07-08: 500 mg via ORAL
  Filled 2023-07-08: qty 1

## 2023-07-08 NOTE — Progress Notes (Signed)
 CSW completed a chart review and patient has Medicare A/B for insurance. The patient has no secondary insurance listed. The patient is not [facility] and hasn't had a recent admission in the last 30 days. The patient will need a 3 night qualifying inpatient stay on the medical floor to be considered for SNF placement. CSW contacted the patients daughter Efraim Kaufmann, [phone]. CSW explained to [facility] that her mother doesn't qualify for SNF through the ED. CSW asked what will be the plan. The daughter stated her plan was for the patient to go to SNF due to being evicted. CSW asked the daughter if her mother can stay with her until she comes up with a long term plan for her mother. The daughter stated she is in the process of applying for medicaid and she doesn't have room for the patient in her home. CSW explained that she will need to come up with a plan and reach out to family because the patient cannot remain in the emergency room if she doesn't get admitted. The daughter stated she would try to come up with a plan.   CSW contacted the ED provider to make them aware that patient would need a hospital admission to qualify for SNF placement.

## 2023-07-08 NOTE — Progress Notes (Addendum)
 [facility] ED [facility] Liaison Note  This patient is active with Home Health (Skilled Nursing and PT) and Home Based Primary Care with [employer], [employer]. In the absence of any acute hospital needs, she can be evaluated and managed at home, including xrays, lab work, Catering manager.  Per our conversations, patient is able to remain in her apartment until end of May. Last week patient and daughter were given a Medicaid application and list of needed documents for submission. Patient and daughter were going to complete and make appointment with DSS. Once application was submitted, Acupuncturist was going to begin process of reaching out for placement with LTC facilities and obtain FL2 from MD.  1616 Addendum: Spoke with daughter Efraim Kaufmann by phone. Apparently, the locks were changed at the apartment and the police called EMS for transport to the ED. Daughter shares patient cannot reside with her. She is currently working on [employer] application and commits to taking it to DSS tomorrow to submit and hopefully obtain Medicaid pending number so the process of LTC search can begin.  Please call with any questions .  Thank you, Haynes Bast, BSN, [facility]  [phone]

## 2023-07-08 NOTE — ED Triage Notes (Signed)
 PT BIBA from home where she was found on the floor with right leg problems.  According to EMS pt is not eating, ambulating and pain in right leg.    Pt is being evicted from apt today

## 2023-07-08 NOTE — Progress Notes (Signed)
 [facility] APS was contacted. CSW awaiting a call back

## 2023-07-08 NOTE — Progress Notes (Signed)
 APS report completed with [facility] APS. The report will be screened by the APS on-call supervisor to determine if the report will be accepted.

## 2023-07-08 NOTE — ED Provider Notes (Signed)
 [facility] Provider Note   CSN: [id number] Arrival date & time: 07/08/23  1433     History  No chief complaint on file.   Anita Price is a 68 y.o. female with past medical history of HTN, tobacco use, prolonged QT, cirrhosis, chronic pain syndrome, sepsis, A-fib, PE presents emergency department for evaluation of chronic right leg pain and SNF placement.  She reports that she has had right leg, weakness, pain since February of this year.  She used to be followed by [facility] but was unable to go to appointments 2/2 weakness so has been treating chronic pain with gabapentin.   Today, she reports that pain has moved to thigh and is described as a "throbbing".  At its worst, the pain is a 9/10 in intensity. She denies recent trauma, falls, unilateral swelling, hx of clot, fever, chills, IVDU.  Due to weakness and inability to walk, she had a MRI of brain and lumbar spine on 3/15 with no explanation for symptoms of RLE. There was lumbar disease affecting LLE noted.   She was most recently seen on 06/25/2023 for RLE pain with negative DVT US . Per EDP note, she also complained of mid thigh pain down to foot similarly to today. She was DC with neurology f/u, keflex for bacteremia. Reports she completed antibiotic cycle and has no urinary symptoms.   Mostly, her and daughter are interested in SNF placement.  She reports she has had difficulty with ADLs since February.  She states that she is unable to get out of bed so has been requiring the use of diapers to urinate.  Her daughter visits her twice a week from [place], [place] for assistance but is not enough.  She currently lives in an apartment without assistance. She had social work consult on 06/15/23 and was unable to be placed due to insurance requiring inpatient stay to qualify for SNF. She recently has been evicted. Was evaluated by home health on 06/29/23 who placed referral for chronic  pain management. She was placed in rehab facility "some time in November" for RLE weakness, inability to ambulate  HPI     Home Medications Prior to Admission medications   Medication Sig Start Date End Date Taking? Authorizing Provider  Acetaminophen (TYLENOL PO) Take 3 tablets by mouth daily as needed.    [provider]  apixaban (ELIQUIS) 5 MG TABS tablet Take 1 tablet (5 mg total) by mouth 2 (two) times daily. 03/06/23   Macdonald Savoy, MD  cephALEXin (KEFLEX) 500 MG capsule Take 1 capsule (500 mg total) by mouth 4 (four) times daily. 06/25/23   Debbra Fairy, PA-C  diclofenac Sodium (VOLTAREN) 1 % GEL Apply 2 g topically 4 (four) times daily. 06/25/23   Debbra Fairy, PA-C  Lactulose 20 GM/30ML SOLN Take 30 mLs (20 g total) by mouth 2 (two) times daily as needed (Please give if no 1-2 BMs per day). 03/18/23   Elgergawy, Ardia Kraft, MD  oxyCODONE (OXY IR/ROXICODONE) 5 MG immediate release tablet Take 0.5 tablets (2.5 mg total) by mouth every 6 (six) hours as needed for moderate pain (pain score 4-6) or breakthrough pain. 03/18/23   Elgergawy, Ardia Kraft, MD  pantoprazole (PROTONIX) 40 MG tablet Take 1 tablet (40 mg total) by mouth daily. 03/07/23   Macdonald Savoy, MD  pregabalin (LYRICA) 300 MG capsule Take 300 mg by mouth in the morning and at bedtime. 01/03/20   [provider]  propranolol (INDERAL) 40 MG tablet Take 1 tablet (40 mg total) by mouth 3 (three) times daily. 03/06/23   Macdonald Savoy, MD      Allergies    Celecoxib, Duloxetine hcl, Tizanidine, Venlafaxine, Effersyllium [psyllium], Escitalopram, Neurontin [gabapentin], and Tramadol    Review of Systems   Review of Systems  Constitutional:  Negative for chills, fatigue and fever.  Respiratory:  Negative for cough, chest tightness, shortness of breath and wheezing.   Cardiovascular:  Negative for chest pain and palpitations.  Gastrointestinal:  Negative for abdominal pain, constipation, diarrhea,  nausea and vomiting.  Neurological:  Negative for dizziness, seizures, weakness, light-headedness, numbness and headaches.    Physical Exam Updated Vital Signs BP (!) 180/82   Pulse (!) 53   Temp 97.9 F (36.6 C) (Oral)   Resp 19   SpO2 100%  Physical Exam Vitals and nursing note reviewed.  Constitutional:      General: She is not in acute distress.    Appearance: Normal appearance.     Comments: Chronically ill appearing. Generalized reduced muscle tone. 37.4kg. No acute discomfort  HENT:     Head: Normocephalic and atraumatic.  Eyes:     General: No visual field deficit.    Conjunctiva/sclera: Conjunctivae normal.  Cardiovascular:     Rate and Rhythm: Normal rate.  Pulmonary:     Effort: Pulmonary effort is normal. No respiratory distress.     Breath sounds: Normal breath sounds.  Chest:     Chest wall: No tenderness.  Abdominal:     General: Bowel sounds are normal. There is no distension.     Palpations: Abdomen is soft.     Tenderness: There is no abdominal tenderness. There is no guarding.  Musculoskeletal:     Cervical back: Full passive range of motion without pain, normal range of motion and neck supple. No spinous process tenderness or muscular tenderness.     Thoracic back: No bony tenderness.     Lumbar back: No bony tenderness.     Right lower leg: No edema.     Left lower leg: No edema.     Comments: No swelling to BLE. Mild TTP of posterior aspect of posterior thigh. Motor 5/5 of right hip. Motor 3/5 of right knee flexion. Motor 1/5 of right ankle. Sensation 1/2 of RLE L3-S2. DP 2+ BLE. Reports this has been occurring since February.  Grip strength intact and equal bilaterally. Motor 5/5 of BUE. Sensation 2/2 of BUE. Median, radial, and ulnar nerves intact of BUE  Skin:    General: Skin is warm.     Capillary Refill: Capillary refill takes less than 2 seconds.     Coloration: Skin is not jaundiced or pale.  Neurological:     Mental Status: She is alert  and oriented to person, place, and time. Mental status is at baseline.     GCS: GCS eye subscore is 4. GCS verbal subscore is 5. GCS motor subscore is 6.     Cranial Nerves: No dysarthria or facial asymmetry.     Sensory: Sensory deficit present.     Motor: Weakness and atrophy present. No tremor, abnormal muscle tone, seizure activity or pronator drift.     Coordination: Finger-Nose-Finger Test normal.     Deep Tendon Reflexes:     Reflex Scores:      Patellar reflexes are 2+ on the right side and 2+ on the left side.    Comments: Following commands appropriately    ED Results /  Procedures / Treatments   Labs (all labs ordered are listed, but only abnormal results are displayed) Labs Reviewed  COMPREHENSIVE METABOLIC PANEL WITH GFR - Abnormal; Notable for the following components:      Result Value   Potassium 3.4 (*)    CO2 20 (*)    Glucose, Bld 111 (*)    Calcium 8.3 (*)    Total Protein 6.1 (*)    Albumin 3.2 (*)    AST 53 (*)    All other components within normal limits  CBC WITH DIFFERENTIAL/PLATELET    EKG None  Radiology No results found.  Procedures Procedures    Medications Ordered in ED Medications  acetaminophen (TYLENOL) tablet 500 mg (500 mg Oral Given 07/08/23 2021)    ED Course/ Medical Decision Making/ A&P                                 Medical Decision Making Amount and/or Complexity of Data Reviewed Labs: ordered.  Risk OTC drugs.   Patient presents to the ED for concern of chronic RLE pain, weakness, and facility placement , this involves an extensive number of treatment options, and is a complaint that carries with it a high risk of complications and morbidity.  The differential diagnosis includes traumatic injury, DVT, CVA, nerve abnormalities   Co morbidities that complicate the patient evaluation  See HPI   Additional history obtained:  Additional history obtained from [facility], Nursing, and Outside Medical Records   External  records from outside source obtained and reviewed including ED notes from 06/14/2023, 06/18/2023, 06/25/2023, MRI of brain and lumbar spine from 06/14/2023, DVT study from 06/25/2023   Lab Tests:  I Ordered, and personally interpreted labs.  The pertinent results include:   Potassium 3.4 CBG 111 Calcium 8.3 (baseline 8.0-8.8 over the past 3 months)    Consultations Obtained:  I requested consultation with TOC, OT, PT,  and discussed lab and imaging findings as well as pertinent plan - pending   Problem List / ED Course:  Chronic right lower extremity pain Has had extensive workup over the past month to include MRI brain, MRI lumbar spine to rule out CVA, radiculopathy.  Has had DVT study rule out clot. Currently is at her baseline that she has been since February.  It is noted that patient went to rehab facility for similar complaints of RLE weakness in November with some improvement so symptoms may have been persisting since November however patient reports February No abnormal CBC nor CMP from patient's baseline No recent traumatic injury or falls to warrant additional imaging at this time. Impaired mobility and ADLs Second time consulting TOC regarding this patient.  They advised that her insurance does not cover ED to SNF placement.  At this time, I do not have any medical reason to admit patient PT and OT pending as patient recently was infected has nowhere to reside.  She is unable to walk and has been unable to walk for many months so cannot just discharge as she is currently homeless   Reevaluation:  After the interventions noted above, I reevaluated the patient and found that they have :stayed the same    Dispostion:  After consideration of the diagnostic results and the patients response to treatment, I feel that the patent would benefit from [facility] consult and SNF/rehab placement d/t difficulty with ADLs, inability to walk.    Final Clinical Impression(s) / ED  Diagnoses Final diagnoses:  Chronic pain of right lower extremity  Impaired mobility and ADLs    Rx / DC Orders ED Discharge Orders     None         Royann Cords, PA 07/09/23 5621    Rosealee Concha, MD 07/14/23 [phone]

## 2023-07-09 ENCOUNTER — Other Ambulatory Visit: Payer: Self-pay

## 2023-07-09 MED ORDER — ACETAMINOPHEN 325 MG PO TABS
650.0000 mg | ORAL_TABLET | Freq: Once | ORAL | Status: AC
  Administered 2023-07-09: 650 mg via ORAL
  Filled 2023-07-09: qty 2

## 2023-07-09 NOTE — ED Notes (Addendum)
 Disregard ED Notes Date of Service: 07/09/2023  2:33 PM Marchelle Folks, RN

## 2023-07-09 NOTE — ED Notes (Addendum)
 Anita Price

## 2023-07-09 NOTE — ED Notes (Signed)
Pt provided with ginger ale 

## 2023-07-09 NOTE — Evaluation (Signed)
 Physical Therapy Evaluation Patient Details Name: Anita Price MRN: [id number] DOB: [birth year] Today's Date: 07/09/2023  History of Present Illness  Patient is a 68 year old female who presented on 11/14 with confusion. Patient was found to have strep intermedius empyema, acute encephalopathy, severe protein calorie malnutrition, and RLL pulmonary emboli. Patient had multiple chest tubes from 11/5 to 11/24.   PMH: Hep C, cirrhosis, chronic pain, anorexia, tobacco use, chronic pain syndrome, ACDF c3-c6  Clinical Impression  The patient  recently in ED, was unable to ambulated then.Patient reports has been nonambulatory x ~2 months, stays on the floor and is able to sometimes crawl around. Patient indicates daughter brings food sometimes.  Patient   with noted fixed right ankle in plantarflexion, does not allow it to be touched due to reports of pain. Patient did sit up onto bed edge and reported significant pain from buttock down to foot, tolerated ~ 1 minute sitting. Skilled PT is limited due to Patient reports of 10/10 Right leg pain, that patient has not ambulated for some time . Unless pain is relieved, patient is limited to transfers, if tolerated.  Continue attempts for mobility at [facility] level. Patient reports that she does not have a WC, Epic notes indicate one was ordered last ED visit prior to return home.     If plan is discharge home, recommend the following: A lot of help with walking and/or transfers;A little help with bathing/dressing/bathroom;Assistance with cooking/housework;Assist for transportation;Help with stairs or ramp for entrance   Can travel by private vehicle        Equipment Recommendations None recommended by PT  Recommendations for Other Services       Functional Status Assessment Patient has not had a recent decline in their functional status     Precautions / Restrictions Precautions Precautions: Fall Precaution/Restrictions Comments: has not ambulated x > 2  months per patient, scoots on the floor, lies on pillows on the floor. Restrictions Weight Bearing Restrictions Per Provider Order: No      Mobility  Bed Mobility   Bed Mobility: Supine to Sit, Sit to Supine     Supine to sit: Contact guard Sit to supine: Contact guard assist   General bed mobility comments: for safety only 2* pt position on high gurney    Transfers                   General transfer comment: unable to attempt due to reportas of pain    Ambulation/Gait               General Gait Details: unable  Stairs            Wheelchair Mobility     Tilt Bed    Modified Rankin (Stroke Patients Only)       Balance Overall balance assessment: Needs assistance Sitting-balance support: Feet unsupported, No upper extremity supported Sitting balance-Leahy Scale: Fair         Standing balance comment: unable                             Pertinent Vitals/Pain Pain Assessment Pain Assessment: Faces Faces Pain Scale: Hurts worst Pain Location: RLE-  from buttock down to foot Pain Descriptors / Indicators: Grimacing, Guarding, Aching, Moaning Pain Intervention(s): Limited activity within patient's tolerance    Home Living Family/patient expects to be discharged to:: Private residence Living Arrangements: Alone Available Help at Discharge: Family;Available PRN/intermittently Type of  Home: Apartment Home Access: Stairs to enter   [employer] of Steps: 2   Home Layout: One level Home Equipment: Agricultural consultant (2 wheels) Additional Comments: TOC notes from previous admission state that RW and Wc ordered, patient states she does not have a WC    Prior Function Prior Level of Function : Needs assist             Mobility Comments: patient states has not ambulated, was unable to ambulate at DC home 3/16 due to LE pain, states that on a good day she can get around on the floor. ADLs Comments: patient reported that  she has been living on the floor in her apartment at "bed level" for ADLs and self feeding. patient reported daughter comes to help every few days. patietn reported meals consisted of "can of green beans" for one day.     Extremity/Trunk Assessment   Upper Extremity Assessment Upper Extremity Assessment: Overall WFL for tasks assessed    Lower Extremity Assessment Lower Extremity Assessment: RLE deficits/detail RLE Deficits / Details: No active toe movement or DF , does not allow to be touched, quite plantarflexed foot. RLE: Unable to fully assess due to pain    Cervical / Trunk Assessment Cervical / Trunk Assessment: Normal  Communication   Communication Communication: No apparent difficulties    Cognition Arousal: Alert Behavior During Therapy: WFL for tasks assessed/performed   PT - Cognitive impairments: Difficult to assess, Safety/Judgement, Awareness                       PT - Cognition Comments: vague on when daughter provides any meals Following commands: Intact       Cueing Cueing Techniques: Verbal cues     General Comments      Exercises     Assessment/Plan    PT Assessment Patient needs continued PT services  PT Problem List Decreased strength;Decreased range of motion;Decreased activity tolerance;Decreased balance;Decreased mobility;Decreased knowledge of use of DME;Decreased safety awareness;Pain       PT Treatment Interventions DME instruction;Gait training;Stair training;Functional mobility training;Therapeutic activities;Therapeutic exercise;Balance training;Patient/family education    PT Goals (Current goals can be found in the Care Plan section)  Acute Rehab PT Goals Patient Stated Goal: go somewhere to be taken care of PT Goal Formulation: With patient Time For Goal Achievement: 07/23/23 Potential to Achieve Goals: Poor    Frequency Min 3X/week     Co-evaluation PT/OT/SLP Co-Evaluation/Treatment: Yes Reason for Co-Treatment:  To address functional/ADL transfers PT goals addressed during session: Mobility/safety with mobility OT goals addressed during session: ADL's and self-care       AM-PAC PT "6 Clicks" Mobility  Outcome Measure Help needed turning from your back to your side while in a flat bed without using bedrails?: A Little Help needed moving from lying on your back to sitting on the side of a flat bed without using bedrails?: A Little Help needed moving to and from a bed to a chair (including a wheelchair)?: Total Help needed standing up from a chair using your arms (e.g., wheelchair or bedside chair)?: Total Help needed to walk in hospital room?: Total Help needed climbing 3-5 steps with a railing? : Total 6 Click Score: 10    End of Session   Activity Tolerance: Patient limited by pain Patient left: in bed (in HALA bed) Nurse Communication: Mobility status PT Visit Diagnosis: Unsteadiness on feet (R26.81);Muscle weakness (generalized) (M62.81);Difficulty in walking, not elsewhere classified (R26.2);Pain Pain - Right/Left: Right  Pain - part of body: Ankle and joints of foot    Time: 1610-9604 PT Time Calculation (min) (ACUTE ONLY): 11 min   Charges:   PT Evaluation $PT Eval Low Complexity: 1 Low   PT General Charges $$ ACUTE PT VISIT: 1 Visit         Blanchard Kelch PT Acute Rehabilitation Services Office [phone]    Rada Hay 07/09/2023, 10:15 AM

## 2023-07-09 NOTE — ED Notes (Addendum)
 Reason [facility] APS was contacted: Pt is unable to take care of self, failure to thrive, was recently evicted from home, inadequate housing/ living conditions. No personal assistance available for pt.

## 2023-07-09 NOTE — Progress Notes (Signed)
 CSW received a phone call that the APS report was screened in. CSW contacted patients daughter Efraim Kaufmann and left an voicemail requesting a call back. Patient needs hospital admission to qualify for SNF placement.

## 2023-07-09 NOTE — Evaluation (Signed)
 Occupational Therapy Evaluation Patient Details Name: Anita Price MRN: [id number] DOB: 10-04-1955 Today's Date: 07/09/2023   History of Present Illness   Patient is a 68 year old female who presented on 11/14 with confusion. Patient was found to have strep intermedius empyema, acute encephalopathy, severe protein calorie malnutrition, and RLL pulmonary emboli. Patient had multiple chest tubes from 11/5 to 11/24.   PMH: Hep C, cirrhosis, chronic pain, anorexia, tobacco use, chronic pain syndrome, ACDF c3-c6     Clinical Impressions Patient evaluated by Occupational Therapy with no further acute OT needs identified. All education has been completed and the patient has no further questions. Patient needs LTC setting for care with current pain levels. Patient is unsafe to d/c back home alone at prior level.  See below for any follow-up Occupational Therapy or equipment needs. OT is signing off. Thank you for this referral.      If plan is discharge home, recommend the following:   Assistance with cooking/housework;Direct supervision/assist for medications management;Assist for transportation;Help with stairs or ramp for entrance;Direct supervision/assist for financial management     Functional Status Assessment   Patient has had a recent decline in their functional status and/or demonstrates limited ability to make significant improvements in function in a reasonable and predictable amount of time     Equipment Recommendations   None recommended by OT      Precautions/Restrictions   Precautions Precautions: Fall Restrictions Weight Bearing Restrictions Per Provider Order: No     Mobility Bed Mobility Overal bed mobility: Needs Assistance Bed Mobility: Supine to Sit, Sit to Supine     Supine to sit: Contact guard Sit to supine: Contact guard assist   General bed mobility comments: for safety only 2* pt position on high gurney          Balance Overall balance  assessment: Needs assistance Sitting-balance support: Feet unsupported, No upper extremity supported Sitting balance-Leahy Scale: Fair         Standing balance comment: unable                           ADL either performed or assessed with clinical judgement   ADL Overall ADL's : Needs assistance/impaired Eating/Feeding: Modified independent;Bed level   Grooming: Bed level;Set up   Upper Body Bathing: Bed level;Set up   Lower Body Bathing: Bed level;Maximal assistance   Upper Body Dressing : Bed level;Minimal assistance   Lower Body Dressing: Bed level;Maximal assistance     Toilet Transfer Details (indicate cue type and reason): patient reported that she has been managing her toileting needs with breifs at "bed level" since febuary.           General ADL Comments: patient is MI at bed level at this time. patient was here in november with recommendations for 24/7 caregiver support in next level of care.     Vision   Vision Assessment?: No apparent visual deficits            Pertinent Vitals/Pain Pain Assessment Pain Assessment: 0-10 Pain Score: 10-Worst pain ever Pain Location: RLE Pain Descriptors / Indicators: Grimacing, Guarding, Aching, Moaning Pain Intervention(s): Limited activity within patient's tolerance, Monitored during session, Repositioned, Patient requesting pain meds-RN notified     Extremity/Trunk Assessment Upper Extremity Assessment Upper Extremity Assessment: Overall WFL for tasks assessed   Lower Extremity Assessment Lower Extremity Assessment: RLE deficits/detail RLE Deficits / Details: No active toe movement or DF , does not allow to  be touched, quite plantarflexed foot. RLE: Unable to fully assess due to pain   Cervical / Trunk Assessment Cervical / Trunk Assessment: Normal   Communication Communication Communication: No apparent difficulties   Cognition Arousal: Alert Behavior During Therapy: WFL for tasks  assessed/performed Cognition: No apparent impairments             OT - Cognition Comments: patient knew she was in the hospital, and today was wednsday. thought the date was the 7th.                 Following commands: Intact                  Home Living Family/patient expects to be discharged to:: Private residence Living Arrangements: Alone Available Help at Discharge: Family;Available PRN/intermittently Type of Home: Apartment Home Access: Stairs to enter Entrance Stairs-Number of Steps: 2   Home Layout: One level     Bathroom Shower/Tub: Chief Strategy Officer: Standard     Home Equipment: Agricultural consultant (2 wheels)   Additional Comments: TOC notes from previous admission state that RW and Wc ordered, patient states she does not have a WC      Prior Functioning/Environment Prior Level of Function : Needs assist             Mobility Comments: patient states has not ambulated, was unable to ambulate at DC home 3/16 due to LE pain, states that on a good day she can get around on the floor. ADLs Comments: patient reported that she has been living on the floor in her apartment at "bed level" for ADLs and self feeding. patient reported daughter comes to help every few days. patietn reported meals consisted of "can of green beans" for one day.    OT Problem List:     OT Treatment/Interventions:        OT Goals(Current goals can be found in the care plan section)   Acute Rehab OT Goals OT Goal Formulation: All assessment and education complete, DC therapy   OT Frequency:       Co-evaluation PT/OT/SLP Co-Evaluation/Treatment: Yes Reason for Co-Treatment: To address functional/ADL transfers PT goals addressed during session: Mobility/safety with mobility OT goals addressed during session: ADL's and self-care      AM-PAC OT "6 Clicks" Daily Activity     Outcome Measure Help from another person eating meals?: A Little Help from  another person taking care of personal grooming?: A Little Help from another person toileting, which includes using toliet, bedpan, or urinal?: Total Help from another person bathing (including washing, rinsing, drying)?: Total Help from another person to put on and taking off regular upper body clothing?: A Lot Help from another person to put on and taking off regular lower body clothing?: Total 6 Click Score: 11   End of Session Nurse Communication: Patient requests pain meds  Activity Tolerance: Patient limited by pain Patient left: in bed (in ED hallway)  OT Visit Diagnosis: Unsteadiness on feet (R26.81);Pain                Time: 6045-4098 OT Time Calculation (min): 11 min Charges:  OT General Charges $OT Visit: 1 Visit OT Evaluation $OT Eval Low Complexity: 1 Low  Morrison Mcbryar OTR/L, MS Acute Rehabilitation Department Office# [phone]   Selinda Flavin 07/09/2023, 12:01 PM

## 2023-07-09 NOTE — ED Provider Notes (Signed)
 Emergency Medicine Observation Re-evaluation Note  Anita Price is a 68 y.o. female, seen on rounds today.  Pt initially presented to the ED for complaints of No chief complaint on file. Currently, the patient is awaiting nursing home placement.  Physical Exam  BP (!) 180/82   Pulse (!) 53   Temp 97.9 F (36.6 C) (Oral)   Resp 19   Ht 1.6 m (5\' 3" )   Wt 50 kg   SpO2 100%   BMI 19.53 kg/m  Physical Exam General: No acute distress Cardiac: Hypertensive with normal heart rate Lungs: Normal respiratory rate and oxygen saturations Psych: Awake and alert  ED Course / MDM  EKG:   I have reviewed the labs performed to date as well as medications administered while in observation.  Recent changes in the last 24 hours include labs obtained with mild hypokalemia, mildly elevated AST stable from prior normal CBC, and greater than 50 white blood cells in urine. Will culture urine and treat with Keflex Plan  Patient with known chronic pain of right lower extremity with impaired mobility.  Social work states at baseline patient and her she would need inpatient admission to qualify from nursing facility. Reviewed notes from off of her care nurse they state that in the absence of any acute hospital needs she can be evaluated and managed at home Patient is in own apartment where she is able to stay until the end of May.  Given Medicaid application list of needed documents for submission.  Also her care social worker was going to begin process of reaching out for placement. They state that they spoke with the daughter Efraim Kaufmann who stated that the locks were changed at the apartment and the police called EMS for transport to the ED Current plan is for SNF placement.    Margarita Grizzle, MD 07/10/23 [phone]

## 2023-07-10 MED ORDER — ACETAMINOPHEN 325 MG PO TABS
650.0000 mg | ORAL_TABLET | Freq: Four times a day (QID) | ORAL | Status: DC | PRN
  Administered 2023-07-10 – 2023-08-07 (×55): 650 mg via ORAL
  Filled 2023-07-10 (×57): qty 2

## 2023-07-10 MED ORDER — LACTULOSE 10 GM/15ML PO SOLN: 20.0000 g | Freq: Two times a day (BID) | ORAL | Status: DC | PRN

## 2023-07-10 MED ORDER — PANTOPRAZOLE SODIUM 40 MG PO TBEC
40.0000 mg | DELAYED_RELEASE_TABLET | Freq: Every day | ORAL | Status: DC
  Administered 2023-07-11 – 2023-08-07 (×28): 40 mg via ORAL
  Filled 2023-07-10 (×29): qty 1

## 2023-07-10 MED ORDER — APIXABAN 5 MG PO TABS
5.0000 mg | ORAL_TABLET | Freq: Two times a day (BID) | ORAL | Status: DC
  Administered 2023-07-10 – 2023-08-07 (×56): 5 mg via ORAL
  Filled 2023-07-10 (×68): qty 1

## 2023-07-10 MED ORDER — PROPRANOLOL HCL 20 MG PO TABS
40.0000 mg | ORAL_TABLET | Freq: Three times a day (TID) | ORAL | Status: DC
  Administered 2023-07-10 – 2023-07-21 (×21): 40 mg via ORAL
  Filled 2023-07-10 (×32): qty 2

## 2023-07-10 MED ORDER — GABAPENTIN 300 MG PO CAPS
300.0000 mg | ORAL_CAPSULE | Freq: Three times a day (TID) | ORAL | Status: DC
  Administered 2023-07-10 – 2023-08-07 (×86): 300 mg via ORAL
  Filled 2023-07-10 (×22): qty 1
  Filled 2023-07-10: qty 3
  Filled 2023-07-10 (×17): qty 1
  Filled 2023-07-10: qty 3
  Filled 2023-07-10 (×8): qty 1
  Filled 2023-07-10: qty 3
  Filled 2023-07-10 (×36): qty 1

## 2023-07-10 NOTE — Progress Notes (Addendum)
 2:15pm: CSW spoke with Primitivo Gauze at [place] [facility] who states Thamas Jaegers has been assigned to the case.   CSW spoke with Thamas Jaegers who states she is planning to come visit patient at [facility] ED this afternoon. Charlcie Cradle to update CSW once visit is completed.  11am: CSW attempted to reach [place] at [facility] without success.  8:15am Patient cannot be placed at SNF from the ED as she does not have a qualifying stay and has traditional Medicare A and B.  CSW will contact [facility] to determine the status of the APS case.  Edwin Dada, MSW, LCSW Transitions of Care  Clinical Social Worker II [phone]

## 2023-07-10 NOTE — ED Provider Notes (Signed)
 Emergency Medicine Observation Re-evaluation Note  Anita SUGARMAN is a 68 y.o. female, seen on rounds today.  Pt initially presented to the ED for complaints of No chief complaint on file. Currently, the patient is asleep, no new concerns by nursing staff.  Physical Exam  BP 125/66 (BP Location: Right Arm)   Pulse (!) 58   Temp 98.3 F (36.8 C) (Oral)   Resp 16   SpO2 100%  Physical Exam General: Asleep, no acute distress Cardiac: Regular rate Lungs: No increased WOB Psych: Calm, asleep  ED Course / MDM  EKG:   I have reviewed the labs performed to date as well as medications administered while in observation.  Recent changes in the last 24 hours include no significant change, patient remains medically cleared. Does not qualify for SNF. Discussing with SW and family regarding staff disposition.  Plan  Current plan is for SW for disposition.    Rexford Maus, DO 07/10/23 [phone]

## 2023-07-11 NOTE — Progress Notes (Signed)
 PT Cancellation Note  Patient Details Name: Anita Price MRN: [id number] DOB: 1955/09/16   Cancelled Treatment:     Nursing in to see if pt is willing to participate in skilled PT session. Pt declined. Will re-attempt next available date/time per POC.    Jannet Askew 07/11/2023, 2:27 PM

## 2023-07-11 NOTE — Progress Notes (Addendum)
 CSW spoke with Thamas Jaegers of [place] [facility] who states patient needs LTC in a facility. Charlcie Cradle understands patient does not have Medicaid at this time and needs to have an application submitted. Charlcie Cradle agreeable to come to [facility] to obtain signatures from patient for a patient Medicaid application. Charlcie Cradle states she will speak with patient's daughter to inform her of information.  Per Charlcie Cradle, there are APS barriers to discharge. Patient is not safe to return to her apartment, as she lives alone and is not able to care for herself properly.  CSW sent message to [facility] to request she review patient for LTC at a SNF with an LOG.  Edwin Dada, MSW, LCSW Transitions of Care  Clinical Social Worker II [phone]

## 2023-07-11 NOTE — ED Provider Notes (Signed)
 Emergency Medicine Observation Re-evaluation Note  Anita Price is a 68 y.o. female, seen on rounds today.  Pt initially presented to the ED for complaints of No chief complaint on file. Currently, the patient is resting.  Physical Exam  BP 127/71 (BP Location: Right Leg)   Pulse 63   Temp 98 F (36.7 C) (Oral)   Resp 17   SpO2 100%  Physical Exam General: NAD   ED Course / MDM  EKG:   I have reviewed the labs performed to date as well as medications administered while in observation.  Recent changes in the last 24 hours include no acute events reported.  Plan  Current plan is for SW placement / APS. Patient cannot be placed at a SNF from the ED as she does not have a qualifying stay and has traditional Medicare A and B. CSW contacting [facility] to determine the status of the APS case.     Anita Fines, MD 07/11/23 [phone]

## 2023-07-11 NOTE — ED Notes (Signed)
 Patient calm and cooperative during this shift. Patient refused to work with PT today. She stated that she refused because she just ate and did not want to become nauseous. Patient compliant with medications during this shift.

## 2023-07-11 NOTE — [place] FL2 (Signed)
 [place] MEDICAID FL2 LEVEL OF CARE FORM     IDENTIFICATION  Patient Name: Anita Price Birthdate: Nov 27, 1955 Sex: female Admission Date (Current Location): 07/08/2023  [facility] and [place] Number:  Producer, television/film/video and Address:  [facility] [place], [address], [place], [place] [address]      Provider Number: [id number]  Attending Physician Name and Address:  System, Provider Not In  Relative Name and Phone Number:       Current Level of Care: Hospital Recommended Level of Care: Skilled Nursing Facility Prior Approval Number:    Date Approved/Denied:   PASRR Number: [id number]  Discharge Plan: SNF    Current Diagnoses: Patient Active Problem List   Diagnosis Date Noted   Hypercoagulable state due to paroxysmal atrial fibrillation (HCC) 04/04/2023   Protein-calorie malnutrition, severe 03/17/2023   Pleural effusion 03/16/2023   Hypomagnesemia 03/05/2023   Paroxysmal atrial fibrillation (HCC) 03/04/2023   Paroxysmal atrial flutter (HCC) 03/04/2023   Prolonged Q-T interval on ECG 03/04/2023   Atrial fibrillation and flutter (HCC) 03/03/2023   Pulmonary embolus (HCC) 03/03/2023   Poor appetite 02/24/2023   Medication management 02/24/2023   DNR (do not resuscitate) discussion 02/21/2023   Lactic acidosis 02/18/2023   Malnutrition of moderate degree 02/18/2023   Palliative care encounter 02/18/2023   Goals of care, counseling/discussion 02/18/2023   Counseling and coordination of care 02/18/2023   Need for emotional support 02/18/2023   Sepsis (HCC) 02/18/2023   Hypophosphatemia 02/16/2023   Thrombocytopenia (HCC) 02/15/2023   Abnormal ultrasound of uterus 02/14/2023   Hypokalemia 02/14/2023   Uncomplicated opioid dependence (HCC) 02/14/2023   Empyema (HCC) 02/13/2023   AKI (acute kidney injury) (HCC) 02/13/2023   Acute metabolic encephalopathy 06/30/2021   Diarrhea 06/30/2021   Hepatic cirrhosis in setting of untreated hepatitis C      Essential hypertension 07/10/2020   Hepatitis C 07/10/2020   Tobacco use 07/10/2020   Macrocytosis 07/10/2020   Hyponatremia 07/10/2020   Hyperglycemia 07/10/2020   Prolonged QT interval 07/10/2020   Sepsis due to Streptococcus intermedius empyema (HCC) 07/10/2020   Cervical radiculopathy 12/04/2014   Chronic pain syndrome due to chronic back pain  08/27/2011    Orientation RESPIRATION BLADDER Height & Weight     Self, Situation, Time, Place  Normal Continent Weight:   Height:     BEHAVIORAL SYMPTOMS/MOOD NEUROLOGICAL BOWEL NUTRITION STATUS      Continent Diet  AMBULATORY STATUS COMMUNICATION OF NEEDS Skin   Limited Assist Verbally Normal                       Personal Care Assistance Level of Assistance  Feeding, Bathing, Dressing Bathing Assistance: Limited assistance Feeding assistance: Limited assistance Dressing Assistance: Limited assistance     Functional Limitations Info  Sight, Hearing, Speech Sight Info: Adequate Hearing Info: Adequate Speech Info: Adequate    SPECIAL CARE FACTORS FREQUENCY  PT (By licensed PT), OT (By licensed OT)     PT Frequency: 3x weekly OT Frequency: 3x weekly            Contractures Contractures Info: Not present    Additional Factors Info  Code Status, Allergies Code Status Info: Full Code Allergies Info: Celecoxib  Duloxetine Hcl  Tizanidine  Venlafaxine  Effersyllium (Psyllium)  Escitalopram  Neurontin (Gabapentin)  Tramadol           Current Medications (07/11/2023):  This is the current hospital active medication list Current Facility-Administered Medications  Medication  Dose Route Frequency Provider Last Rate Last Admin   acetaminophen (TYLENOL) tablet 650 mg  650 mg Oral Q6H PRN Rexford Maus, DO   650 mg at 07/10/23 1049   apixaban (ELIQUIS) tablet 5 mg  5 mg Oral BID Elayne Snare K, DO   5 mg at 07/10/23 2203   gabapentin (NEURONTIN) capsule 300 mg  300 mg Oral TID Elayne Snare K, DO    300 mg at 07/10/23 2203   lactulose (CHRONULAC) 10 GM/15ML solution 20 g  20 g Oral BID PRN Elayne Snare K, DO       pantoprazole (PROTONIX) EC tablet 40 mg  40 mg Oral Daily Kingsley, Victoria K, DO       propranolol (INDERAL) tablet 40 mg  40 mg Oral TID Kingsley, Victoria K, DO   40 mg at 07/10/23 1605   Current Outpatient Medications  Medication Sig Dispense Refill   Acetaminophen (TYLENOL PO) Take 3 tablets by mouth daily as needed.     apixaban (ELIQUIS) 5 MG TABS tablet Take 1 tablet (5 mg total) by mouth 2 (two) times daily.     gabapentin (NEURONTIN) 300 MG capsule Take 1 capsule 3 times a day by oral route for 30 days.     pantoprazole (PROTONIX) 40 MG tablet Take 1 tablet (40 mg total) by mouth daily. (Patient taking differently: Take 40 mg by mouth daily as needed (acid reflux).)     pregabalin (LYRICA) 300 MG capsule Take 300 mg by mouth in the morning and at bedtime.     propranolol (INDERAL) 40 MG tablet Take 1 tablet (40 mg total) by mouth 3 (three) times daily.     diclofenac Sodium (VOLTAREN) 1 % GEL Apply 2 g topically 4 (four) times daily. (Patient not taking: Reported on 07/10/2023) 200 g 0   oxyCODONE (OXY IR/ROXICODONE) 5 MG immediate release tablet Take 0.5 tablets (2.5 mg total) by mouth every 6 (six) hours as needed for moderate pain (pain score 4-6) or breakthrough pain. (Patient not taking: Reported on 07/10/2023) 10 tablet 0     Discharge Medications: Please see discharge summary for a list of discharge medications.  Relevant Imaging Results:  Relevant Lab Results:   Additional Information SSN [id number]  Inis Sizer, LCSW

## 2023-07-12 NOTE — ED Notes (Signed)
 Pt was provided with her night time snack....[place] sandwich and a ginger ale.

## 2023-07-12 NOTE — ED Provider Notes (Signed)
 Emergency Medicine Observation Re-evaluation Note  Anita Price is a 68 y.o. female, seen on rounds today.  Pt initially presented to the ED for complaints of No chief complaint on file. Currently, the patient is resting in bed asleep without agitation.  Physical Exam  BP 129/65 (BP Location: Left Arm)   Pulse (!) 58   Temp 98.2 F (36.8 C) (Oral)   Resp 16   SpO2 100%  Physical Exam General: Resting without distress Cardiac: Not tachycardic on last vitals Lungs: Symmetric rise and fall of chest without respiratory distress Psych: No agitation at this time  ED Course / MDM  EKG:   I have reviewed the labs performed to date as well as medications administered while in observation.  Recent changes in the last 24 hours include none reported by overnight nursing.  Plan  Current plan is for awaiting placement.    Luchiano Viscomi, Marine Sia, MD 07/12/23 [phone]

## 2023-07-13 MED ORDER — PREGABALIN 50 MG PO CAPS
300.0000 mg | ORAL_CAPSULE | Freq: Two times a day (BID) | ORAL | Status: DC
  Administered 2023-07-13: 300 mg via ORAL
  Filled 2023-07-13: qty 6

## 2023-07-13 NOTE — ED Notes (Signed)
 Pt given lunch tray.

## 2023-07-13 NOTE — ED Notes (Signed)
 PT diaper changed by pt.  She ask for one and it was given

## 2023-07-13 NOTE — ED Notes (Signed)
 Patient has been alert this shift. Patient cooperative with care and staff. Patient ate all meals.  Patient needs assist with ADLs and states can not walk.

## 2023-07-13 NOTE — ED Provider Notes (Signed)
 Emergency Medicine Observation Re-evaluation Note  Anita Price is a 68 y.o. female, seen on rounds today.  Pt initially presented to the ED for complaints of No chief complaint on file. Currently, the patient is asleep in bed, no new concerns by nursing staff.  Physical Exam  BP (!) 113/51 (BP Location: Right Arm)   Pulse 60   Temp 97.8 F (36.6 C) (Oral)   Resp 16   SpO2 98%  Physical Exam General: Asleep, no acute distress Cardiac: Regular rate Lungs: No increased work of breathing Psych: Calm, sleep  ED Course / MDM  EKG:   I have reviewed the labs performed to date as well as medications administered while in observation.  Recent changes in the last 24 hours include patient remains medically cleared.  She is pending placement, seeking long-term care facility.  Plan  Current plan is for placement.    Kingsley, Jordayn Mink K, DO 07/13/23 [phone]

## 2023-07-14 MED ORDER — ONDANSETRON 4 MG PO TBDP
4.0000 mg | ORAL_TABLET | Freq: Once | ORAL | Status: DC
  Filled 2023-07-14: qty 1

## 2023-07-14 MED ORDER — METHOCARBAMOL 500 MG PO TABS
500.0000 mg | ORAL_TABLET | Freq: Three times a day (TID) | ORAL | Status: DC | PRN
  Administered 2023-07-14 – 2023-07-28 (×27): 500 mg via ORAL
  Filled 2023-07-14 (×31): qty 1

## 2023-07-14 NOTE — Progress Notes (Signed)
 Physical Therapy Treatment Patient Details Name: Anita Price MRN: [id number] DOB: January 24, 1956 Today's Date: 07/14/2023   History of Present Illness Patient is a 68 year old female who presented on 11/14 with confusion. Patient was found to have strep intermedius empyema, acute encephalopathy, severe protein calorie malnutrition, and RLL pulmonary emboli. Patient had multiple chest tubes from 11/5 to 11/24.   PMH: Hep C, cirrhosis, chronic pain, anorexia, tobacco use, chronic pain syndrome, ACDF c3-c6    PT Comments  PT - Cognition Comments: AxO x 2 pleasant.  Pt stated, "now I can't walk" but Pt did agree to get OOB to wheelchair. General transfer comment: Pt required much assist to transfer from bed to wheelchair then even more help from wheelchair to bed.  Pt did self propel wc a short distance using B UE's.  Unable to place R foot on floor due to pain.      If plan is discharge home, recommend the following: A lot of help with walking and/or transfers;A little help with bathing/dressing/bathroom;Assistance with cooking/housework;Assist for transportation;Help with stairs or ramp for entrance   Can travel by private vehicle        Equipment Recommendations  None recommended by PT    Recommendations for Other Services       Precautions / Restrictions Precautions Precautions: Fall Precaution/Restrictions Comments: has not ambulated x > 2 months per patient, scoots on the floor, lies on pillows on the floor. Restrictions Weight Bearing Restrictions Per Provider Order: No     Mobility  Bed Mobility   Bed Mobility: Supine to Sit, Sit to Supine     Supine to sit: Supervision Sit to supine: Supervision   General bed mobility comments: pt self able with increased time    Transfers Overall transfer level: Needs assistance Equipment used: None Transfers: Bed to chair/wheelchair/BSC   Stand pivot transfers: Mod assist, Max assist         General transfer comment: Pt  required much assist to transfer from bed to wheelchair then even more help from wheelchair to bed    Ambulation/Gait               General Gait Details: transfer only was all Pt agreed to perform   Stairs             Wheelchair Mobility     Tilt Bed    Modified Rankin (Stroke Patients Only)       Balance                                            Communication Communication Communication: No apparent difficulties  Cognition Arousal: Alert Behavior During Therapy: WFL for tasks assessed/performed   PT - Cognitive impairments: Difficult to assess, Safety/Judgement, Awareness                       PT - Cognition Comments: AxO x 2 pleasant.  Pt stated, "now I can't walk" but Pt did agree to get OOB to wheelchair. Following commands: Intact      Cueing Cueing Techniques: Verbal cues  Exercises      General Comments        Pertinent Vitals/Pain Pain Assessment Pain Assessment: No/denies pain    Home Living  Prior Function            PT Goals (current goals can now be found in the care plan section) Progress towards PT goals: Progressing toward goals    Frequency    Min 3X/week      PT Plan      Co-evaluation              AM-PAC PT "6 Clicks" Mobility   Outcome Measure  Help needed turning from your back to your side while in a flat bed without using bedrails?: A Little Help needed moving from lying on your back to sitting on the side of a flat bed without using bedrails?: A Little Help needed moving to and from a bed to a chair (including a wheelchair)?: A Lot Help needed standing up from a chair using your arms (e.g., wheelchair or bedside chair)?: Total Help needed to walk in hospital room?: Total Help needed climbing 3-5 steps with a railing? : Total 6 Click Score: 11    End of Session Equipment Utilized During Treatment: Gait belt Activity Tolerance:  Patient tolerated treatment well Patient left: in bed Nurse Communication: Mobility status PT Visit Diagnosis: Unsteadiness on feet (R26.81);Muscle weakness (generalized) (M62.81);Difficulty in walking, not elsewhere classified (R26.2);Pain Pain - Right/Left: Right Pain - part of body: Ankle and joints of foot     Time: 0981-1914 PT Time Calculation (min) (ACUTE ONLY): 20 min  Charges:    $Therapeutic Activity: 8-22 mins PT General Charges $$ ACUTE PT VISIT: 1 Visit                     Bess Broody  PTA Acute  Rehabilitation Services Office M-F          [phone]

## 2023-07-14 NOTE — Progress Notes (Addendum)
 Per [name] with [facility], APS worker, Elois Hair is following up on amount of income and whether pt's daughter has access. If not, Lakita will initiate process to go through courts for daughter to access funds to proceed with long term care plan.

## 2023-07-15 NOTE — ED Provider Notes (Signed)
 Emergency Medicine Observation Re-evaluation Note  Anita Price is a 68 y.o. female, seen on rounds today.  Pt initially presented to the ED for complaints of No chief complaint on file. Currently, the patient is in room without complaint.  Physical Exam  BP (!) 125/45 (BP Location: Left Arm)   Pulse 60   Temp 97.9 F (36.6 C) (Oral)   Resp 16   Ht 5\' 3"  (1.6 m)   Wt 38.1 kg   SpO2 98%   BMI 14.88 kg/m  Physical Exam General: Awake, alert Cardiac: Regular rate Lungs: Normal effort Psych: Mood is appropriate  ED Course / MDM  EKG:   I have reviewed the labs performed to date as well as medications administered while in observation.  Recent changes in the last 24 hours include no significant changes, still pending placement.  Daughter is attempting to confirm financial documents to help facilitate placement, per social work report..  Plan  Current plan is for placement.    Afton Horse T, DO 07/15/23 [phone]

## 2023-07-15 NOTE — Progress Notes (Addendum)
 CSW attempted to contact Anita Price, APS caseworker, without success. Left HIPAA Compliant voicemail requesting a call back.   Addend @ 8:16AM Per Jolyne Needs, daughter was supposed to be retrieving bank statements for [employer]. She reports she will follow up with daughter and let this Clinical research associate know.

## 2023-07-16 ENCOUNTER — Other Ambulatory Visit: Payer: Self-pay

## 2023-07-16 NOTE — Progress Notes (Signed)
 CSW received voicemail from Elois Hair that patient's daughter will be bringing bank statements which Jolyne Needs reports she will retrieve today.   CSW provided Lakita with [name] at [facility] phone number due to [name] having questions about who has access to patients bank accounts. TOC following.

## 2023-07-16 NOTE — ED Provider Notes (Signed)
 Emergency Medicine Observation Re-evaluation Note  Anita Price is a 68 y.o. female, seen on rounds today.  Pt initially presented to the ED for complaints of Foot Pain (Right foot/leg pain) Currently, the patient is resting   Physical Exam  BP (!) 108/46 (BP Location: Right Arm)   Pulse (!) 57   Temp (!) 97.5 F (36.4 C) (Oral)   Resp 16   Ht 5\' 3"  (1.6 m)   Wt 38.1 kg   SpO2 98%   BMI 14.88 kg/m  Physical Exam General: NAD Cardiac: RR Lungs: non-labored Psych: Calm   ED Course / MDM  EKG:   I have reviewed the labs performed to date as well as medications administered while in observation.  Recent changes in the last 24 hours include None .  Plan  Current plan is for Placement.    Rolinda Climes, DO 07/16/23 [phone]

## 2023-07-17 NOTE — ED Provider Notes (Signed)
 Emergency Medicine Observation Re-evaluation Note  Anita Price is a 68 y.o. female, seen on rounds today.  Pt initially presented to the ED for complaints of Foot Pain (Right foot/leg pain) Currently, the patient is resting comfortably.  Physical Exam  BP 130/71 (BP Location: Left Arm)   Pulse 65   Temp 98.3 F (36.8 C) (Oral)   Resp 18   Ht 1.6 m (5\' 3" )   Wt 38.1 kg   SpO2 99%   BMI 14.88 kg/m  Physical Exam   ED Course / MDM  EKG:   I have reviewed the labs performed to date as well as medications administered while in observation.  Recent changes in the last 24 hours include none.  Plan  Current plan is for placement.    Lind Repine, MD 07/17/23 [phone]

## 2023-07-18 NOTE — Progress Notes (Signed)
 PT Cancellation Note  Patient Details Name: DESTYNIE TOOMEY MRN: [id number] DOB: Aug 02, 1955   Cancelled Treatment:     Pt declined all attempts for any OOB activity due to R LE pain that Pt describes as pins and needles with numbness  At the bottom foot.  It comes and goes.  Taking Tylenol  Pt c/o that's not enough.  Hurts even more in a dependent position.  Pt stated, she has not been able to walk for months and admits to crawling.   TOC working on discharge disposition.   Katheryn Leap  PTA Acute  Rehabilitation Services Office M-F          [phone]

## 2023-07-18 NOTE — ED Provider Notes (Signed)
 Emergency Medicine Observation Re-evaluation Note  Anita Price is a 68 y.o. female, seen on rounds today.  Pt initially presented to the ED for complaints of Foot Pain (Right foot/leg pain) Currently, the patient is resting  Physical Exam  BP (!) 110/50 (BP Location: Left Arm)   Pulse (!) 56   Temp 97.7 F (36.5 C) (Oral)   Resp 18   Ht 1.6 m (5\' 3" )   Wt 38.1 kg   SpO2 98%   BMI 14.88 kg/m  Physical Exam   ED Course / MDM  EKG:   I have reviewed the labs performed to date as well as medications administered while in observation.  Recent changes in the last 24 hours include none.  Plan  Current plan is for placement.    Lind Repine, MD 07/18/23 [phone]

## 2023-07-19 NOTE — ED Provider Notes (Signed)
 Emergency Medicine Observation Re-evaluation Note  Anita Price is a 68 y.o. female, seen on rounds today.  Pt initially presented to the ED for complaints of Foot Pain (Right foot/leg pain) Currently, the patient is placement.  Physical Exam  BP 114/61 (BP Location: Left Arm)   Pulse (!) 55   Temp (!) 97.5 F (36.4 C) (Oral)   Resp 17   Ht 5\' 3"  (1.6 m)   Wt 38.1 kg   SpO2 100%   BMI 14.88 kg/m  Physical Exam General: NAD   ED Course / MDM  EKG:   I have reviewed the labs performed to date as well as medications administered while in observation.  Recent changes in the last 24 hours include no acute events reported.  Plan  Current plan is for placement.    Burnette Carte, MD 07/19/23 [phone]

## 2023-07-20 MED ORDER — OXYCODONE HCL 5 MG PO TABS
5.0000 mg | ORAL_TABLET | ORAL | Status: AC
  Administered 2023-07-20: 5 mg via ORAL
  Filled 2023-07-20: qty 1

## 2023-07-20 NOTE — ED Notes (Signed)
 Pt in bed in nad at this time, appears asleep

## 2023-07-20 NOTE — ED Notes (Signed)
Pt in nad at this time.

## 2023-07-20 NOTE — ED Notes (Signed)
Received report from [place], [place]

## 2023-07-20 NOTE — ED Notes (Signed)
 Messaged provider for more pain meds per pt request

## 2023-07-20 NOTE — ED Notes (Signed)
 In bed in nad at this time, had breakfast and I brought patient a soda. Tech in with patient assisting her now

## 2023-07-20 NOTE — ED Notes (Signed)
 Notified provider that pt still looking for stronger prn pain med as I have just medicated her, but her pain is stated as 10/10

## 2023-07-20 NOTE — ED Notes (Signed)
 In nad eating dinner at this time

## 2023-07-20 NOTE — ED Notes (Signed)
 Pt in bed, sitter present for patient safety

## 2023-07-20 NOTE — ED Notes (Signed)
 I've held pt's propranolol  due to low bp, provider aware

## 2023-07-20 NOTE — ED Notes (Signed)
 Laying in bed in nad at this time

## 2023-07-20 NOTE — ED Provider Notes (Signed)
 Emergency Medicine Observation Re-evaluation Note  Anita Price is a 68 y.o. female, seen on rounds today.  Pt initially presented to the ED for complaints of Foot Pain (Right foot/leg pain) Currently, the patient is complaining of leg pain.  Physical Exam  BP 127/66 (BP Location: Left Arm)   Pulse (!) 54   Temp 97.8 F (36.6 C) (Oral)   Resp 14   Ht 1.6 m (5\' 3" )   Wt 38.1 kg   SpO2 100%   BMI 14.88 kg/m  Physical Exam General: NAD Cardiac: mild bradycardia Lungs: normal rr, normal sats Psych: co pain  ED Course / MDM  EKG:   I have reviewed the labs performed to date as well as medications administered while in observation.  Recent changes in the last 24 hours include none.  Plan  Current plan is for placement.    Auston Blush, MD 07/20/23 [phone]

## 2023-07-21 NOTE — Progress Notes (Signed)
 CSW attempted to contact APS caseworker, Elois Hair. Left HIPAA Compliant voicemail requesting call back.

## 2023-07-21 NOTE — ED Provider Notes (Signed)
 Emergency Medicine Observation Re-evaluation Note  Anita Price is a 68 y.o. female, seen on rounds today.  Pt initially presented to the ED for complaints of Foot Pain (Right foot/leg pain) Currently, the patient is awake and alert.  Physical Exam  BP 120/60 (BP Location: Left Arm)   Pulse (!) 56   Temp 97.6 F (36.4 C) (Oral)   Resp 16   Ht 5\' 3"  (1.6 m)   Wt 38.1 kg   SpO2 97%   BMI 14.88 kg/m  Physical Exam General: asleep Cardiac: rr Lungs: clear Psych: calm  ED Course / MDM  EKG:   I have reviewed the labs performed to date as well as medications administered while in observation.  Recent changes in the last 24 hours include none.  Plan  Current plan is for TOC placement.    Sueellen Emery, MD 07/21/23 [phone]

## 2023-07-21 NOTE — ED Notes (Signed)
 Pt slept on and off throughout the night. Safety sitter present at bedside for pt's safety. Denies any concerns at this time.

## 2023-07-21 NOTE — Progress Notes (Signed)
 CSW received notification from Elois Hair that they are having to request 5 years of bank statements from pt's bank for the [facility] application. They are also attempting to look into insurance policies. Jolyne Needs did inform she is out of the office today and will contact pt's bank tomorrow.

## 2023-07-22 MED ORDER — PROPRANOLOL HCL 20 MG PO TABS
20.0000 mg | ORAL_TABLET | Freq: Three times a day (TID) | ORAL | Status: DC
  Administered 2023-07-22 – 2023-08-07 (×22): 20 mg via ORAL
  Filled 2023-07-22 (×37): qty 1

## 2023-07-22 MED ORDER — OXYCODONE HCL 5 MG PO TABS
5.0000 mg | ORAL_TABLET | Freq: Once | ORAL | Status: AC
  Administered 2023-07-22: 5 mg via ORAL
  Filled 2023-07-22: qty 1

## 2023-07-22 NOTE — ED Provider Notes (Signed)
 Propranolol  repeatedly being held due to low blood pressures.  Decreasing dose to 20 mg 3 times daily.   Ninetta Basket, MD 07/22/23 1311

## 2023-07-22 NOTE — Progress Notes (Signed)
 CSW received call from Elois Hair who reports pt banks with [facility] bank and they charge $5/bank statement which pt has stated she cannot afford. Jolyne Needs reported she notified [name] of this information. [name] will retrieve bank statements on tomorrow. Jolyne Needs states the Medicaid application has been submitted and she will provide the name and contact information to [name] once she has it.

## 2023-07-22 NOTE — ED Provider Notes (Signed)
 Emergency Medicine Observation Re-evaluation Note  Anita Price is a 68 y.o. female, seen on rounds today.  Pt initially presented to the ED for complaints of Foot Pain (Right foot/leg pain) Currently, the patient has no acute complaints  Physical Exam  BP (!) 116/57 (BP Location: Left Arm)   Pulse (!) 54   Temp 97.6 F (36.4 C) (Oral)   Resp 16   Ht 5\' 3"  (1.6 m)   Wt 38.1 kg   SpO2 98%   BMI 14.88 kg/m  Physical Exam General: Resting comfortably in stretcher Lungs: Normal work of breathing Psych: Calm  ED Course / MDM  EKG:   I have reviewed the labs performed to date as well as medications administered while in observation.  Recent changes in the last 24 hours include none.  Plan  Current plan is for placement.    Ninetta Basket, MD 07/22/23 1101

## 2023-07-22 NOTE — Progress Notes (Signed)
 PT Cancellation Note  Patient Details Name: Anita Price MRN: [id number] DOB: Jan 06, 1956   Cancelled Treatment:     Pt resting but easily aroused.  Introduced my self, Pt stated "I remember you Physical Therapy".  Room dark.  "I can't do anything" stated Pt.  Pt c/o R$ LE pain.  "It hurts all the time".  Pt stated she "stays in the bed" all the time.  She does not get to the bathroom.  "I wear briefs" and she eats in bed "propped up".  Will update LPT regarding x 2 refusals to participate.  Per chart review, TOC working on LTC bed.    Bess Broody  PTA Acute  Rehabilitation Services Office M-F          [phone]

## 2023-07-23 NOTE — ED Provider Notes (Signed)
 Emergency Medicine Observation Re-evaluation Note  Anita Price is a 68 y.o. female, seen on rounds today.  Pt initially presented to the ED for complaints of Foot Pain (Right foot/leg pain) Currently, the patient is sleeping.  Physical Exam  BP 114/65 (BP Location: Left Arm)   Pulse 63   Temp 97.6 F (36.4 C) (Oral)   Resp 17   Ht 1.6 m (5\' 3" )   Wt 38.1 kg   SpO2 99%   BMI 14.88 kg/m  Physical Exam General: nad Cardiac: regular rate   ED Course / MDM  EKG:   I have reviewed the labs performed to date as well as medications administered while in observation.  Recent changes in the last 24 hours include episodes of lower bp.  BP meds held.  Plan  Current plan is for placement.    Trish Furl, MD 07/23/23 [phone]

## 2023-07-23 NOTE — Progress Notes (Signed)
 [name] is visiting the pt to retrieve the bank statements and have the pt sign a form to communicate with DSS. [name] reports she received the Medicaid caseworker information from Elois Hair this morning and has emailed her.

## 2023-07-24 NOTE — ED Provider Notes (Signed)
 Emergency Medicine Observation Re-evaluation Note  Anita Price is a 68 y.o. female, seen on rounds today.  Pt initially presented to the ED for complaints of Foot Pain (Right foot/leg pain) Currently, the patient is resting.  Physical Exam  BP (!) 116/55 (BP Location: Left Arm)   Pulse 63   Temp 97.8 F (36.6 C) (Oral)   Resp 18   Ht 5\' 3"  (1.6 m)   Wt 38.1 kg   SpO2 99%   BMI 14.88 kg/m  Physical Exam General: NAD   ED Course / MDM  EKG:   I have reviewed the labs performed to date as well as medications administered while in observation.  Recent changes in the last 24 hours include no acute events reported.  Plan  Current plan is for placement.    Burnette Carte, MD 07/24/23 [phone]

## 2023-07-24 NOTE — Progress Notes (Addendum)
 CSW received text from Vallie Gay that [facility] will accept pt. CSW presented bed offer to pt who wishes to review before making a decision.  Addend @ 5:59 AM Pt declined [facility] due to her daughter being unable to drive at this time. Reports she will accept [facility] offer.  CSW has faxed out to additional SNFs in [place] for LTC. [name] notified.

## 2023-07-25 DIAGNOSIS — R188 Other ascites: Secondary | ICD-10-CM | POA: Insufficient documentation

## 2023-07-25 DIAGNOSIS — D638 Anemia in other chronic diseases classified elsewhere: Secondary | ICD-10-CM | POA: Insufficient documentation

## 2023-07-25 NOTE — ED Notes (Signed)
 Pt woke up with worsening chronic right lower leg pain. PRN pain medications given see mar. Pt is now resting comfortably. Call bell remains within reach.

## 2023-07-25 NOTE — ED Notes (Signed)
 MD made aware.

## 2023-07-25 NOTE — ED Notes (Signed)
 Pt advised Nurse that she was given oxycodone  PRN as a sleep aid yesterday and would like the same for today if possible. This nurse advised pt that it will be up to MD.

## 2023-07-25 NOTE — ED Notes (Signed)
 Pt is resting comfortably in hospital bed. Call bell and water  within reach.

## 2023-07-25 NOTE — Progress Notes (Signed)
 PT Note  Patient Details Name: Anita Price MRN: [id number] DOB: [birth year]  Spoke at bedside for a while with Ms. Lacek. Noting she has been challenging to participate with any PT OOB activities due to reports of the R LE pain. I learned from nursing and pt that she has been performing cleaning, changing depends, etc at bed level supine independently with set up.   After speaking with Ms. Kam, she reports she was walking back in February (after [facility]- SNF for rehab) and that this R LE pain has become so sever that she cannot stand and is fearful of standing. She reports that it feels " numb" at times from R knee down and she initially was very fearful of trying to stand. Now she states at times she feels " sharp tingling" in the R LE as if it is waking op from being asleep. This pain she states is severe. As she was talking with me she was Right sidelying with a pillow between her legs in her preferred position, however she states there is NO position where the pain goes away. She did roll over on her back ( supine) while talking ans after about 30 seconds, the pain became increased and in her R buttocks area as well. This seems to exhibit  a radicular pattern from lumbar region.   I took the time to emphasize what she would like as a goal, that if she continues to give into the pain and never get up again,she has lost a lot of muscle strength and also the ability to fell balanced and safe upright ( BPS, etc) . She stated she would like to be able to walk again and get up and move around. She stated "if I can't then I want "God to just take me away now" . I continued to  explained how " if" it is coming from her back, we need to try to get things centralized and that would be by slowly trying to tolerate getting the back into neutral position instead of giving into it in the flexed posture. I emphasized that it will hurt, but it is important. I also told her I would share these" findings" observations  with the MD .   She did have an MRI back 06/14/2023 with some clinical findings, however seem to be for posterolaterally left protrusion instead of right. (?)   I encouraged her to begin to sit EOB with nursing, or I will check back with her next week see if she is willing to begin to try any mobility, or if she has had an clinical findings to help explain this radicular pain.     Jim Motts 07/25/2023, 12:10 PM Cheral Cordoba, PT, MPT Acute Rehabilitation Services Office: [phone] If a weekend: secure chat groups: [facility] PT, [facility] OT, [facility] SLP 07/25/2023

## 2023-07-25 NOTE — ED Provider Notes (Signed)
 Emergency Medicine Observation Re-evaluation Note  Anita Price is a 68 y.o. female, Afib on eliquis , h/o PE, prolonged QT, HTN, cirrhosis w/ ascites in s/o hep C, thrombocytopenia, opiate dependence, chronic pain syndrome, seen on rounds today.  Pt initially presented to the ED for complaints of Foot Pain (Right foot/leg pain) Currently, the patient is resting.  Physical Exam  BP (!) 106/58 (BP Location: Right Arm)   Pulse 65   Temp 98.2 F (36.8 C) (Oral)   Resp 17   Ht 5\' 3"  (1.6 m)   Wt 38.1 kg   SpO2 99%   BMI 14.88 kg/m  Physical Exam General: NAD Cardiac: well-perfused Lungs: No resp distress Psych: Calm/resting  ED Course / MDM  EKG:   I have reviewed the labs performed to date as well as medications administered while in observation.  Recent changes in the last 24 hours include: pending admission to [facility].   Plan  Current plan is for placement.      Merdis Stalling, MD 07/25/23 [phone]

## 2023-07-26 NOTE — ED Notes (Signed)
 Patient awake at this time. Patient requested for wash cloth to clean up.

## 2023-07-26 NOTE — ED Notes (Signed)
 Patient requesting muscle relaxer. Writer educated patient order is written for every 8 hours and last administration was 0812.

## 2023-07-26 NOTE — ED Notes (Signed)
 Patient requesting muscle relaxer. States she has pain all over 10/10

## 2023-07-26 NOTE — ED Notes (Signed)
 This nurse sent the below epic message to MD per pt request.   Hi, pt is requesting to speak to an MD regarding her pain meds. Pt would like medication either changed or adjusted to help with break through pain.

## 2023-07-26 NOTE — ED Provider Notes (Signed)
 Emergency Medicine Observation Re-evaluation Note  Anita Price is a 68 y.o. female, seen on rounds today.  Pt initially presented to the ED for complaints of Foot Pain (Right foot/leg pain) Currently, the patient is resting.  Physical Exam  BP 112/65 (BP Location: Left Arm)   Pulse 68   Temp 98.2 F (36.8 C) (Oral)   Resp 16   Ht 5\' 3"  (1.6 m)   Wt 38.1 kg   SpO2 100%   BMI 14.88 kg/m  Physical Exam General: NAD Cardiac: Regular HR Lungs: No respiratory distress Psych: Stable  ED Course / MDM  EKG:   I have reviewed the labs performed to date as well as medications administered while in observation.  Recent changes in the last 24 hours include pain med/muscle relaxer needed overnight.  Plan  Current plan is for [facility] assistance with placement    Jahi Roza, Janalyn Me, MD 07/26/23 1044

## 2023-07-27 MED ORDER — OXYCODONE HCL 5 MG PO TABS
5.0000 mg | ORAL_TABLET | Freq: Once | ORAL | Status: AC
  Administered 2023-07-27: 5 mg via ORAL
  Filled 2023-07-27: qty 1

## 2023-07-27 NOTE — ED Notes (Signed)
 This Nurse sent the below Epic message to [facility] MD;  Hello doc, pt is requesting something to help her fall asleep. Pt stated break through pain is 10/10 and was previously given oxycodone  and helped her go to sleep.

## 2023-07-27 NOTE — ED Notes (Signed)
 Patient asked for pain meds for her leg. States wants to speak to the provider because she needs break though pain meds. Monique Ano, MD made aware. Given prn pain meds

## 2023-07-27 NOTE — ED Notes (Signed)
 Patient lying comfortably in bed awake. Patient safety sitter outside of room.

## 2023-07-27 NOTE — ED Provider Notes (Signed)
 Emergency Medicine Observation Re-evaluation Note  Anita Price is a 68 y.o. female, seen on rounds today.  Pt initially presented to the ED for complaints of Foot Pain (Right foot/leg pain) Currently, the patient is sleeping.  Physical Exam  BP (!) 112/57 (BP Location: Left Arm)   Pulse (!) 56   Temp 97.6 F (36.4 C) (Oral)   Resp 16   Ht 5\' 3"  (1.6 m)   Wt 38.1 kg   SpO2 100%   BMI 14.88 kg/m  Physical Exam General: nad Cardiac: regular Lungs: clear Psych: calm  ED Course / MDM  EKG:   I have reviewed the labs performed to date as well as medications administered while in observation.  Recent changes in the last 24 hours include none.  Plan  Current plan is for placement.    Almond Army, MD 07/27/23 1209

## 2023-07-28 MED ORDER — OXYCODONE HCL 5 MG PO TABS
5.0000 mg | ORAL_TABLET | ORAL | Status: DC | PRN
  Administered 2023-07-28 – 2023-08-01 (×16): 5 mg via ORAL
  Filled 2023-07-28 (×15): qty 1

## 2023-07-28 MED ORDER — METHOCARBAMOL 500 MG PO TABS
1000.0000 mg | ORAL_TABLET | Freq: Four times a day (QID) | ORAL | Status: DC | PRN
  Administered 2023-07-28 – 2023-08-07 (×22): 1000 mg via ORAL
  Filled 2023-07-28 (×23): qty 2

## 2023-07-28 NOTE — ED Notes (Signed)
 pt states having pain 8/10 in r leg, was given tylenol  and robaxin  at 0530 with some relief but pain is increasing again.

## 2023-07-28 NOTE — ED Provider Notes (Signed)
 Emergency Medicine Observation Re-evaluation Note  Anita Price is a 68 y.o. female, seen on rounds today.  Pt initially presented to the ED for complaints of Foot Pain (Right foot/leg pain) Currently, the patient is sleeping.  Physical Exam  BP (!) 106/54 (BP Location: Left Arm)   Pulse (!) 59   Temp 98 F (36.7 C)   Resp 16   Ht 5\' 3"  (1.6 m)   Wt 38.1 kg   SpO2 100%   BMI 14.88 kg/m  Physical Exam General: Sleeping Cardiac: Extremities well-perfused Lungs: Breathing is unlabored Psych: Deferred  ED Course / MDM  EKG:   I have reviewed the labs performed to date as well as medications administered while in observation.  Recent changes in the last 24 hours include none.  Plan  Current plan is for placement.    Iva Mariner, MD 07/28/23 [phone]

## 2023-07-29 NOTE — ED Notes (Signed)
 Patient resting in bed.

## 2023-07-29 NOTE — ED Notes (Signed)
 Patient resting in bed breathing eyes closed

## 2023-07-29 NOTE — Progress Notes (Addendum)
 Per Bertell Broach (rep at [facility]), their business office has outreached to patient's Medicaid caseworker to confirm pending app.

## 2023-07-29 NOTE — ED Provider Notes (Signed)
 Emergency Medicine Observation Re-evaluation Note  Anita Price is a 68 y.o. female, seen on rounds today.  Pt initially presented to the ED for complaints of Foot Pain (Right foot/leg pain) Currently, the patient is resting comfortably.  Physical Exam  BP (!) 117/56 (BP Location: Left Arm)   Pulse 60   Temp 97.8 F (36.6 C) (Oral)   Resp 16   Ht 5\' 3"  (1.6 m)   Wt 38.1 kg   SpO2 100%   BMI 14.88 kg/m  Physical Exam General: No acute distress Cardiac: Regular rate Lungs: No distress Psych: Currently calm  ED Course / MDM  EKG:   I have reviewed the labs performed to date as well as medications administered while in observation.  Recent changes in the last 24 hours include - no new changes.  Plan  Current plan is for holding for social work placement.    Deatra Face, MD 07/29/23 1050

## 2023-07-29 NOTE — ED Notes (Signed)
 Pt resting comfortable in bed, NAD. Medicated with routine medication order.

## 2023-07-30 NOTE — ED Provider Notes (Signed)
 Patient resting this morning Vitals stable overnight - some chronic hypotension/borderline while sleeping  Pending TOC assistance with placement   Arvilla Birmingham, MD 07/30/23 [phone]

## 2023-07-30 NOTE — ED Notes (Signed)
 Patient alert this shift.  Cooperative and appropriate with staff.  Patient lying in bed. Patient medication compliant. Patient needs assist with ADLs.

## 2023-07-31 NOTE — ED Provider Notes (Signed)
 Emergency Medicine Observation Re-evaluation Note  Anita Price is a 68 y.o. female, seen on rounds today.  Pt initially presented to the ED for complaints of Foot Pain (Right foot/leg pain) Currently, the patient is resting comfortably.  Physical Exam  BP (!) 100/58 (BP Location: Left Arm)   Pulse 68   Temp 97.9 F (36.6 C) (Oral) Comment: 98.9  Resp 18   Ht 5\' 3"  (1.6 m)   Wt 38.1 kg   SpO2 98%   BMI 14.88 kg/m  Physical Exam General: No acute distress Cardiac: Regular rate Lungs: No distress Psych: Currently calm  ED Course / MDM  EKG:   I have reviewed the labs performed to date as well as medications administered while in observation.  Recent changes in the last 24 hours include - no new changes.  Plan  Current plan is for holding for social work placement.      Albertus Hughs, DO 07/31/23 [phone]

## 2023-07-31 NOTE — Care Management (Addendum)
 Received call from Tanya w/[employer] who reports she left a call for patient's daughter, awaiting a call back. Bertell Broach also reports the APS SW called her to advise she has spoke with DSS who will await call from daughter to determine if pt's daughter has received Medicaid letter.    TOC will continue to follow.

## 2023-07-31 NOTE — Progress Notes (Signed)
 Per Bertell Broach at [facility] - their business office was informed that the [facility] caseworker is out of the office. CSW provide daughter's contact information to discuss pt's income.

## 2023-08-01 NOTE — ED Notes (Signed)
 Pt requesting Oxy IR 5 mg , medication held due to low BP 100/63 , repeat 96/58.

## 2023-08-01 NOTE — ED Notes (Signed)
 Anita Price is very focused on narcotics for pain relief and reports severe pain but she remains hypotensive and border line bradycardic. Anita Price is very prompted with med times and request next dose oxy IR  exactly 3 hours after her last dose. Just prior to requesting medication she was observed watching TV with hands behind her head with relaxed posture. She displays no outward signs of pain and her vital signs were BP 100/63 HR 62 resp 16 sat 100% RA. Tylenol  and robaxin  were given oxy was held d/t low BP. See previous note. Will notify EDP of pt request to speak with them regarding pain medications.

## 2023-08-01 NOTE — Progress Notes (Addendum)
 CSW attempted to contact APS caseworker, Jolyne Needs, to inquire about who the covering Medicaid caseworker is. CSW also inquired about whether patient's daughter is able to produce potential documentation needed to complete the Medicaid process (life insurance policy).   Addend @ 8:35AM Per Medicaid caseworker, patient will first need to be placed in order to be approved. Jolyne Needs stated she will email the caseworker to inquire about any potential documentation needed that may be a barrier to approval. TOC following.

## 2023-08-01 NOTE — ED Provider Notes (Signed)
 Emergency Medicine Observation Re-evaluation Note  Anita Price is a 68 y.o. female, seen on rounds today.  Pt initially presented to the ED for complaints of Foot Pain (Right foot/leg pain) Currently, the patient is awaiting evaluation for nursing home placement..  Physical Exam  BP (!) 103/55 (BP Location: Right Arm)   Pulse 66   Temp 98 F (36.7 C) (Oral)   Resp 18   Ht 1.6 m (5\' 3" )   Wt 38.1 kg   SpO2 99%   BMI 14.88 kg/m  Physical Exam General: Resting comfortably Cardiac:  Lungs: No acute respiratory distress Psych: Baseline  ED Course / MDM  EKG:   I have reviewed the labs performed to date as well as medications administered while in observation.  Recent changes in the last 24 hours include no specific problems overnight.  Plan  Current plan is for waiting evaluation for nursing home placement by social worker case management.    Drevion Offord, MD 08/01/23 [phone]

## 2023-08-01 NOTE — Progress Notes (Signed)
 PT Note  Patient Details Name: Anita Price MRN: [id number] DOB: 16-Dec-1955       Met with pt at bedside again. Pt was currently eating and I did not want to interrupt her meal for a PT session. Did speak with her again about her goals and she does want to try different positions to help eliminate the pain and tingling in her R LE so she can sit and walk again. She seems motivated to begin work on this.   Her pain seems to be consistent with nerve pain in R lower leg and a times in the right glut. She has 0/5 DF and 0/5 PF on the R LE with absent light or deep pressure sensation in all toes , begins to exhibit sensation starting at the forefoot and dorsum foot. And with pain response with light touch to anterior and posterior lower leg.   Could there be any other possibilities to assess the cause of this numbness and nerve pain, and/or other medications to address potential inflammation to assist with mobility soon. Without any changes to her current plan, we may find barriers to helping assist her to mobilize.   We will continue to keep pt on caseload to do what we can at this time.      Jim Motts 08/01/2023, 5:03 PM Tuana Hoheisel, PT, MPT Acute Rehabilitation Services Office: [phone] If a weekend: secure chat groups: [facility] PT, [facility] OT, [facility] SLP 08/01/2023

## 2023-08-02 MED ORDER — OXYCODONE HCL 5 MG PO TABS
5.0000 mg | ORAL_TABLET | Freq: Four times a day (QID) | ORAL | Status: DC | PRN
  Administered 2023-08-02 – 2023-08-07 (×21): 5 mg via ORAL
  Filled 2023-08-02 (×21): qty 1

## 2023-08-02 NOTE — ED Notes (Signed)
 Patient calm and cooperative this shift ongoing pain 10/10 to legs.

## 2023-08-03 NOTE — ED Provider Notes (Signed)
 Emergency Medicine Observation Re-evaluation Note  Anita Price is a 68 y.o. female, seen on rounds today.  Pt initially presented to the ED for complaints of Foot Pain (Right foot/leg pain) Currently, the patient is resting.  Physical Exam  BP (!) 99/54 (BP Location: Left Arm)   Pulse (!) 57   Temp 98.2 F (36.8 C) (Oral)   Resp 17   Ht 1.6 m (5\' 3" )   Wt 38.1 kg   SpO2 99%   BMI 14.88 kg/m  Physical Exam  ED Course / MDM  EKG:   I have reviewed the labs performed to date as well as medications administered while in observation.  Recent changes in the last 24 hours include none.  Plan  Current plan is for placement.    Lind Repine, MD 08/03/23 [phone]

## 2023-08-04 NOTE — Progress Notes (Addendum)
 CSW spoke with pt to provide update. Pt stated she wished to review facility. Pt looked up facility and was dissatisfied, continuing to state she prefers [facility]. CSW did inform of Medicare.gov official ratings [id number] and [facility]. Patient states she was previously at [facility] and they were good to her. She also noted she wished to be able to smoke at the facility which [facility] allowed her today.   CSW outreached to [employer] who is over [facility]. Per [name], patient has a balance with them and the financial team would need to speak with Lavonda Pour. CSW provided contact information.   Addend @ 11:41AM CSW provided Elois Hair, APS caseworker, an update.

## 2023-08-04 NOTE — ED Notes (Signed)
 Patient has been calm and cooperative during this shift. Patient will ask for PRN Oxycodone  around the clock and report 8-10/10 pain.

## 2023-08-04 NOTE — Progress Notes (Addendum)
 CSW has outreached to Elois Hair, APS caseworker, requesting update on whether there are any barriers to pt's Medicaid application being approved. Awaiting response. If no barriers, CSW will outreach to [facility] to inquire about accepting the pt Medicaid pending.   Addend @ 9:22AM CSW spoke with Lavonda Pour [phone]), covering Medicaid caseworker who reports she does not foresee any barriers to approval. CSW notified Bertell Broach at [facility] who requested Natasha's information. CSW provided and Bertell Broach reported she will have her business office give them a call. TOC following.

## 2023-08-05 NOTE — ED Notes (Signed)
 Patient is calm and cooperative during this shift. Patient will make needs known and ask for PRN Oxycodone  around the clock. She c/o constant R leg pain 8-10/10.

## 2023-08-05 NOTE — Progress Notes (Signed)
 PT Cancellation Note  Patient Details Name: Anita Price MRN: [id number] DOB: 1956-02-20   Cancelled Treatment:     Spoke with pt again at bedside for at least 10 minutes. Explaining importance of moving, starting slow, exercises against gravity for muscle building. Pt does understand and agree as well, however states it is not a good time. She states she continues to have R leg pain and numbness and decreased muscle activit( see previous note). I explained that we are well versed  with assessing and workign with patients like her , and walking again is very probably but she has to want to walk. She states she wants to move and walk again, but has an excuse each time I come to see her.   At this time I will need to sign off, I explained this to hr, and when she is willing to start participating, please re order.   She did sit up in bed with no back support and r LE bent ER ( like butterfly position) with no remarks to pain wit this moment as I brought her a new Tshirt of which she took the old one off and donned the new one with no issues.      Jim Motts 08/05/2023, 5:05 PM Cheral Cordoba, PT, MPT Acute Rehabilitation Services Office: [phone] If a weekend: secure chat groups: [facility] PT, [facility] OT, [facility] SLP 08/05/2023

## 2023-08-05 NOTE — ED Notes (Addendum)
 Pt provided with a [place] sandwich, cheese blocks, and ginger ale for snack.

## 2023-08-06 NOTE — ED Provider Notes (Signed)
 Emergency Medicine Observation Re-evaluation Note  Anita Price is a 68 y.o. female, seen on rounds today.  Pt initially presented to the ED for complaints of boarding in ED awaiting SNF placement. No new c/o this AM.   Physical Exam  BP 111/71 (BP Location: Left Arm)   Pulse 64   Temp 98 F (36.7 C) (Oral)   Resp 16   Ht 1.6 m (5\' 3" )   Wt 38.1 kg   SpO2 98%   BMI 14.88 kg/m  Physical Exam General: calm. Cardiac: regular rate. Lungs: breathing comfortably.   ED Course / MDM    I have reviewed the labs performed to date as well as medications administered while in observation.  Recent changes in the last 24 hours include ED obs, reassessment.   Plan  TOC notes reviewed - patient has boarding in ED ~ 700 hours - patient needs to progress towards discharge to first available facility.  Dispo per [facility] team.    Guadalupe Lee, MD 08/06/23 [phone]

## 2023-08-06 NOTE — ED Notes (Signed)
 Pt requested pain medication but was asleep when it was brought to the bedside. Will allow pt to sleep until she request something for pain again.

## 2023-08-06 NOTE — Care Management (Signed)
 Received call from patient inquiring about status of her placement. Patient reports she owes her daughter and unable to pay 3 months upfront. Patient reports a SW came to speak with her and told that they would get in touch with her tomorrow. Patient reports her plan is to go to SNF, however she wants to make payments. This RNCM encourage patient to allow all parties involved to complete their process and get back with her on tomorrow as previously discussed.   TOC following.

## 2023-08-06 NOTE — Progress Notes (Addendum)
 Vallie Gay with [facility] facilities to visit patient today to discuss LTC before noon.   Addend @ 12:01PM CSW received call from [employer] informing patient's previous Medicaid application ([facility] stated a Medicaid app when pt was at their facility in December) will be approved today; however, because patient discharged and went home (prior to this admission), a new application is needed. CSW has outreached to Elois Hair, APS caseworker who reports she will visit patient today to initiate a new Medicaid application. Lavonda Pour has reported that she does not anticipate barriers. Medicaid application will be approved once patient is physically placed into SNF.   Vallie Gay is visiting patient now to discuss previous stay (per tammy, patient requested to leave daily on previous stay). TOC following for updates.   Addend @ 12:48PM CSW received call from [name] who informed Jolyne Needs is checking into patients' bank account so the patient previous balance can be paid.

## 2023-08-06 NOTE — ED Notes (Signed)
 Anita Price remains asleep pain medication will be available when she awakens.

## 2023-08-07 MED ORDER — GABAPENTIN 300 MG PO CAPS
300.0000 mg | ORAL_CAPSULE | Freq: Three times a day (TID) | ORAL | 0 refills | Status: AC
Start: 2023-08-07 — End: 2023-09-06

## 2023-08-07 MED ORDER — OXYCODONE HCL 5 MG PO TABS: 5.0000 mg | ORAL_TABLET | Freq: Four times a day (QID) | ORAL | 0 refills | Status: AC | PRN

## 2023-08-07 NOTE — ED Provider Notes (Signed)
 Emergency Medicine Observation Re-evaluation Note  Anita Price is a 68 y.o. female, seen on rounds today.  Pt initially presented to the ED for complaints of Foot Pain (Right foot/leg pain) Currently, the patient is awaiting SNF placement.  Physical Exam  BP (!) 106/58 (BP Location: Left Arm)   Pulse (!) 58   Temp 98.2 F (36.8 C) (Oral)   Resp 17   Ht 5\' 3"  (1.6 m)   Wt 38.1 kg   SpO2 100%   BMI 14.88 kg/m  Physical Exam General: Awake, alert Cardiac: Regular rate Lungs: Normal effort Psych: Appropriate mood  ED Course / MDM  EKG:   I have reviewed the labs performed to date as well as medications administered while in observation.  Recent changes in the last 24 hours include patient declined medications yesterday, as well as refused an EKG.  Otherwise no events..  Plan  Current plan is for placement at a SNF, currently attempting to get Medicaid.Afton Horse T, DO 08/07/23 0730

## 2023-08-07 NOTE — ED Notes (Signed)
 Ptar called per Child psychotherapist

## 2023-08-07 NOTE — Progress Notes (Signed)
 Per Anita Price, patient completed Medicaid application and she is turning it in now. Patient also has enough money in her bank account to pay her balance at [facility] and pay her daughter. Awaiting response from [name].

## 2023-08-07 NOTE — ED Notes (Signed)
 [facility] transport to [facility], meds returned to patient .

## 2023-08-07 NOTE — ED Notes (Signed)
 Anita Price was offered haldol per order Dr Plumbo and she refused it . Stating " that doctor thinks I'm crazy, I'm not crazy and you can keep that sh--! ". Pt also refused the EKG that was offered.

## 2023-08-07 NOTE — Progress Notes (Signed)
 CSW outreached to Anita Price to inquire about visiting for [facility] app. Anita Price reports she was unable to get by yesterday but will come by today before noon. Anita Price will take the Medicaid app directly to Anita Price once it is complete. TOC following.

## 2023-08-07 NOTE — ED Notes (Signed)
Report given to RN Vee

## 2023-08-07 NOTE — Progress Notes (Signed)
 Patient will discharge to [facility] today. EDP and RN made aware via secure chat. SNF requesting hard scripts for pain medication - care team aware. PTAR to transport.

## 2023-11-10 ENCOUNTER — Emergency Department (HOSPITAL_COMMUNITY)
Admission: EM | Admit: 2023-11-10 | Discharge: 2023-11-11 | Disposition: A | Discharge status: Home / Self Care | Attending: Emergency Medicine | Admitting: Emergency Medicine

## 2023-11-10 ENCOUNTER — Emergency Department (HOSPITAL_COMMUNITY)

## 2023-11-10 DIAGNOSIS — W050XXA Fall from non-moving wheelchair, initial encounter: Secondary | ICD-10-CM | POA: Insufficient documentation

## 2023-11-10 DIAGNOSIS — M542 Cervicalgia: Secondary | ICD-10-CM | POA: Diagnosis not present

## 2023-11-10 DIAGNOSIS — W19XXXA Unspecified fall, initial encounter: Secondary | ICD-10-CM

## 2023-11-10 DIAGNOSIS — S0990XA Unspecified injury of head, initial encounter: Secondary | ICD-10-CM

## 2023-11-10 LAB — I-STAT CHEM 8, ED
BUN: 13 mg/dL (ref 8–23)
Calcium, Ion: 1.1 mmol/L — ABNORMAL LOW (ref 1.15–1.40)
Chloride: 106 mmol/L (ref 98–111)
Creatinine, Ser: 0.8 mg/dL (ref 0.44–1.00)
Glucose, Bld: 96 mg/dL (ref 70–99)
HCT: 40 % (ref 36.0–46.0)
Hemoglobin: 13.6 g/dL (ref 12.0–15.0)
Potassium: 3.7 mmol/L (ref 3.5–5.1)
Sodium: 142 mmol/L (ref 135–145)
TCO2: 23 mmol/L (ref 22–32)
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
## 2023-11-11 DIAGNOSIS — S0990XA Unspecified injury of head, initial encounter: Secondary | ICD-10-CM | POA: Diagnosis not present

## 2023-11-11 MED ORDER — OXYCODONE-ACETAMINOPHEN 5-325 MG PO TABS
2.0000 | ORAL_TABLET | Freq: Once | ORAL | Status: AC
  Administered 2023-11-11 (×2): 2 via ORAL
  Filled 2023-11-11: qty 2

## 2023-11-11 NOTE — Discharge Instructions (Signed)
 Your CT scan showed no acute traumatic injury.  Please follow-up with your regular doctor.  Return for new or worsening symptoms.

## 2023-11-13 ENCOUNTER — Other Ambulatory Visit: Payer: Self-pay | Admitting: Orthopedic Surgery

## 2023-11-13 DIAGNOSIS — M542 Cervicalgia: Secondary | ICD-10-CM

## 2023-11-28 ENCOUNTER — Inpatient Hospital Stay: Admission: RE | Admit: 2023-11-28 | Source: Ambulatory Visit

## 2023-12-13 ENCOUNTER — Inpatient Hospital Stay: Admission: RE | Admit: 2023-12-13 | Source: Ambulatory Visit

## 2023-12-18 ENCOUNTER — Ambulatory Visit
Admission: RE | Admit: 2023-12-18 | Discharge: 2023-12-18 | Disposition: A | Discharge status: Home / Self Care | Source: Ambulatory Visit | Attending: Orthopedic Surgery | Admitting: Orthopedic Surgery

## 2023-12-18 DIAGNOSIS — M542 Cervicalgia: Secondary | ICD-10-CM

## 2024-01-28 IMAGING — DX DG CHEST 1V PORT
1 series · 1 of 1 positions shown · non-contrast
Comparison: 06/30/2021

CLINICAL DATA: Shortness of breath.

EXAM:
PORTABLE CHEST 1 VIEW

[chest]
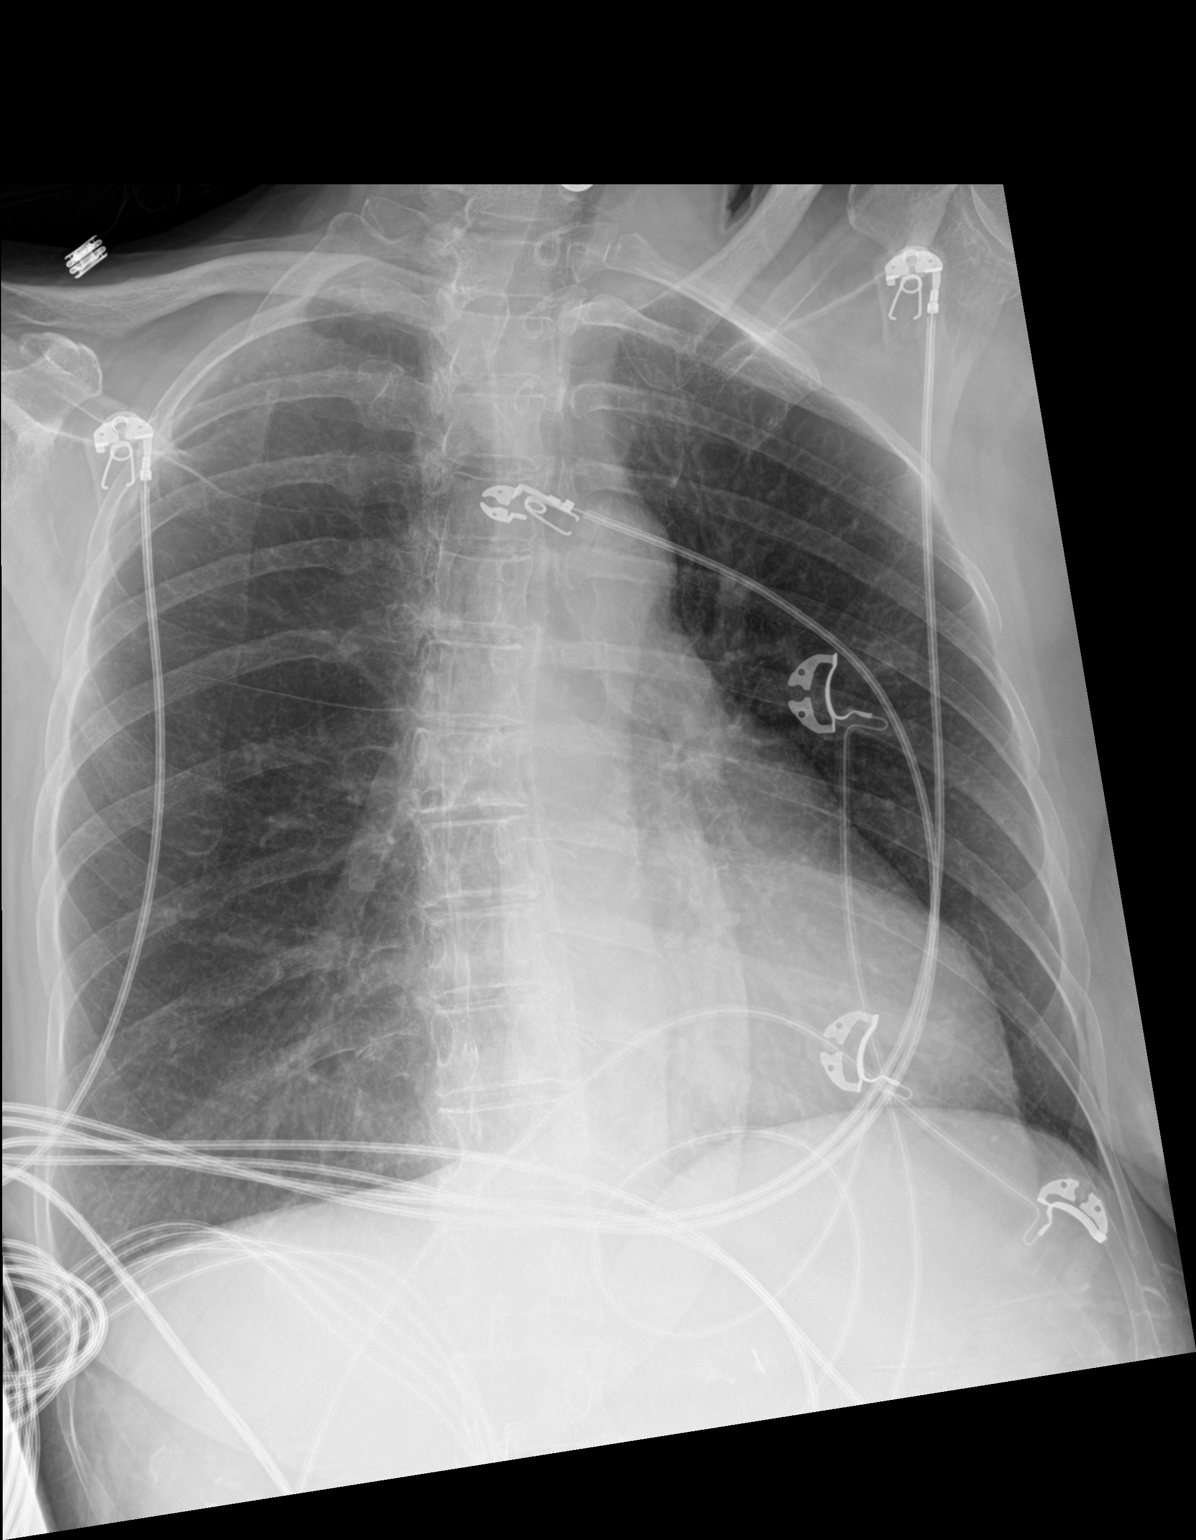

[1 of 1 positions shown; findings below may reference images not displayed]

FINDINGS: The lungs are clear without focal pneumonia, edema, pneumothorax or
pleural effusion. The cardiopericardial silhouette is within normal
limits for size. The visualized bony structures of the thorax are
unremarkable. Telemetry leads overlie the chest.
IMPRESSION: No active disease.

## 2024-01-28 IMAGING — CT CT ABD-PELV W/ CM
2 of 4 series · 13 of 36 positions shown, 19 images · IV contrast (Omni 300)
Comparison: 07/10/2020

CLINICAL DATA: Abdominal pain

EXAM:
CT ABDOMEN AND PELVIS WITH CONTRAST
TECHNIQUE: Multidetector CT imaging of the abdomen and pelvis was performed
using the standard protocol following bolus administration of
intravenous contrast.

[Series 4: a/p w/ 5mm · axial · 0.79mm/px · z∈[-390,+20]mm · 12 of 94 slices shown, 17 images]
[im 6/94  soft-tissue]
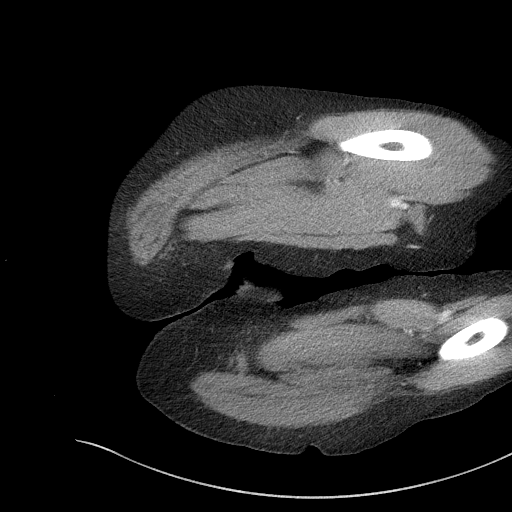
[im 6/94  bone]
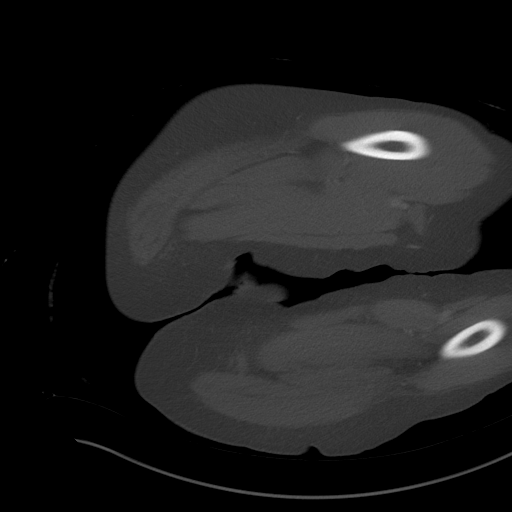
[im 16/94  soft-tissue]
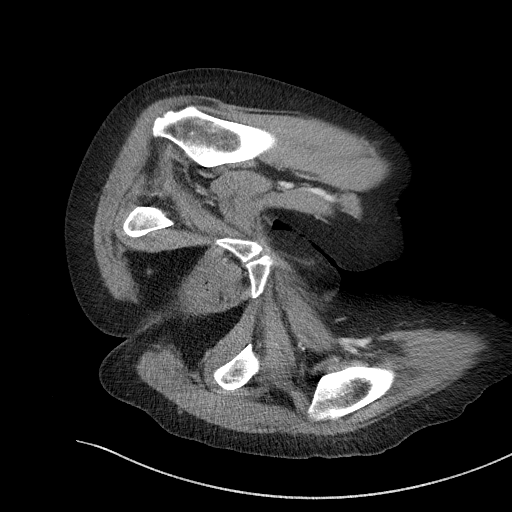
[im 21/94  soft-tissue]
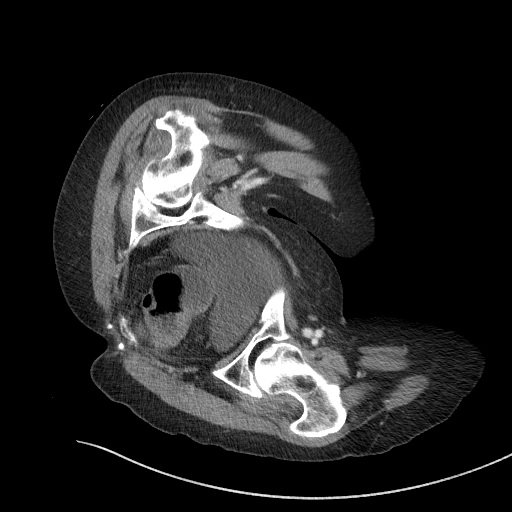
[im 32/94  soft-tissue]
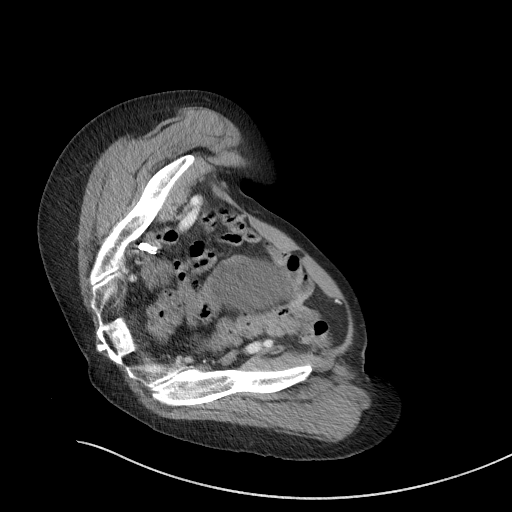
[im 37/94  soft-tissue]
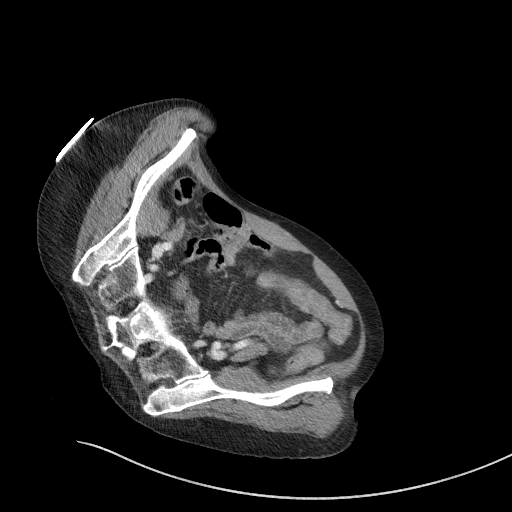
[im 47/94  soft-tissue]
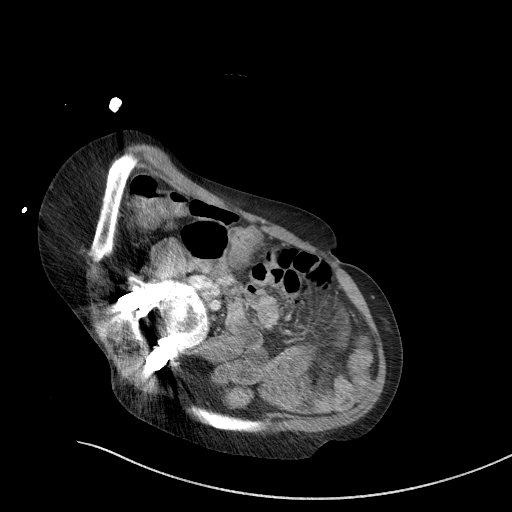
[im 57/94  soft-tissue]
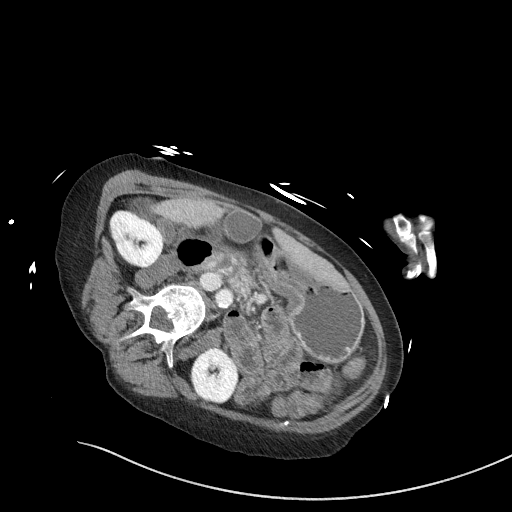
[im 63/94  soft-tissue]
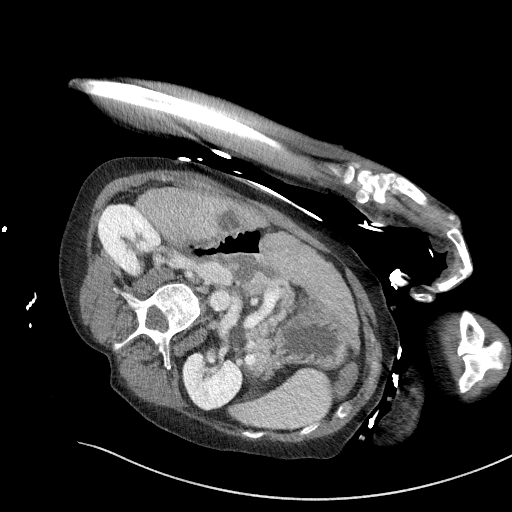
[im 73/94  soft-tissue]
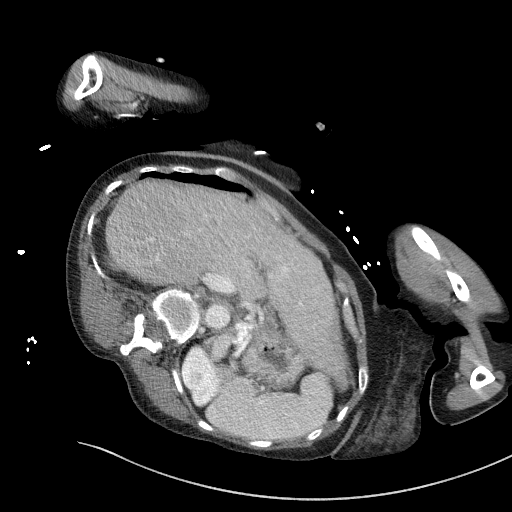
[im 73/94  lung]
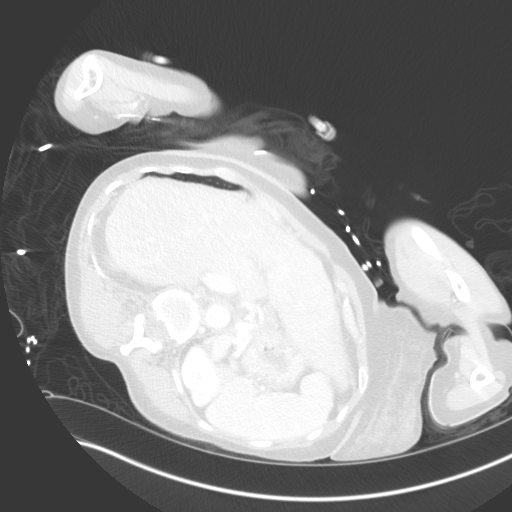
[im 73/94  bone]
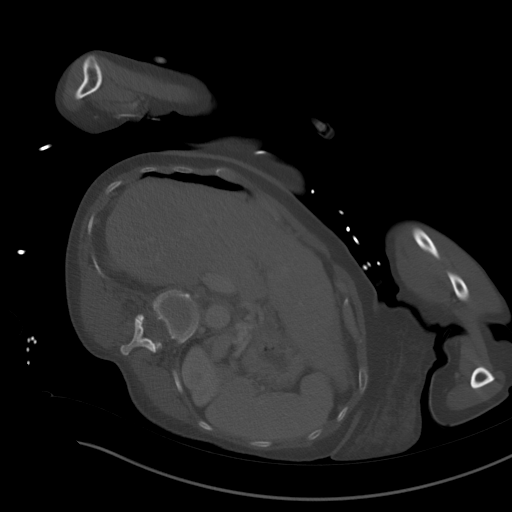
[im 78/94  soft-tissue]
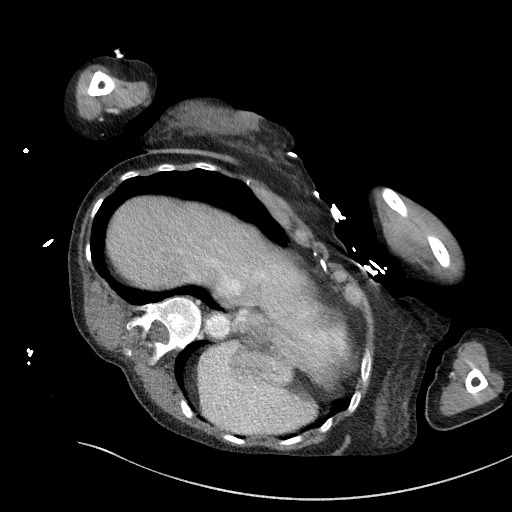
[im 78/94  lung]
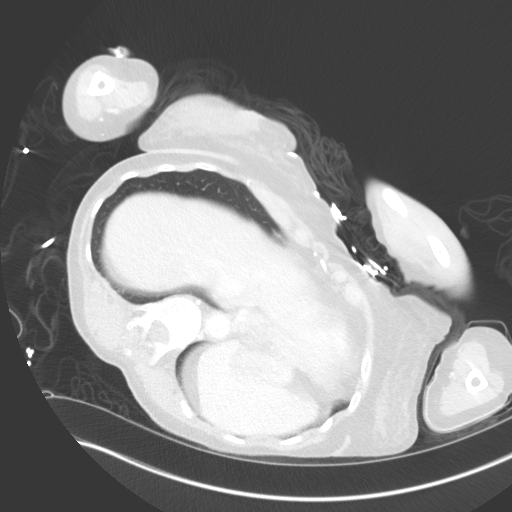
[im 83/94  lung]
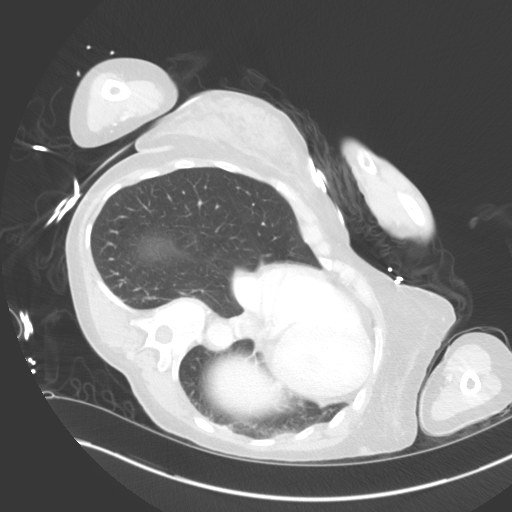
[im 88/94  soft-tissue]
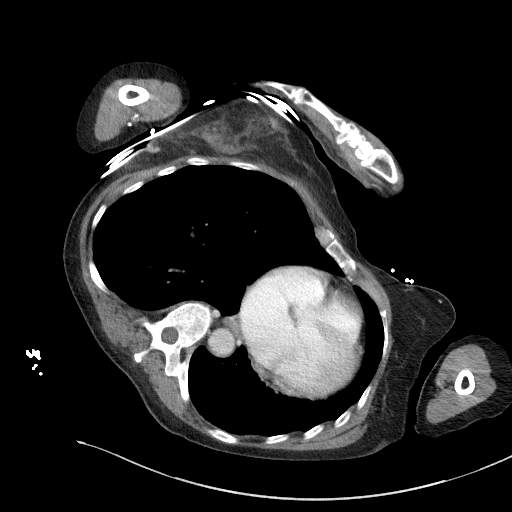
[im 88/94  lung]
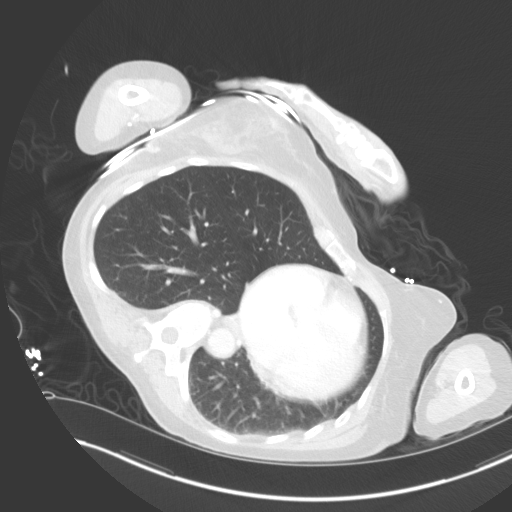

[Series 8: a/p w/ sag · sagittal · 0.84mm/px · 1 of 193 slices shown, 2 images]
[im 65/193  soft-tissue]
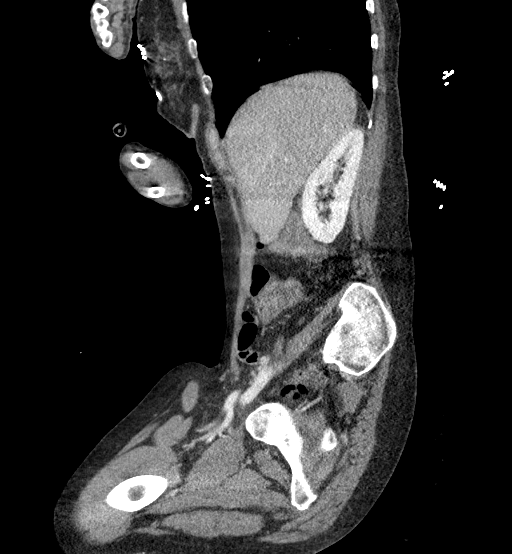
[im 65/193  bone]
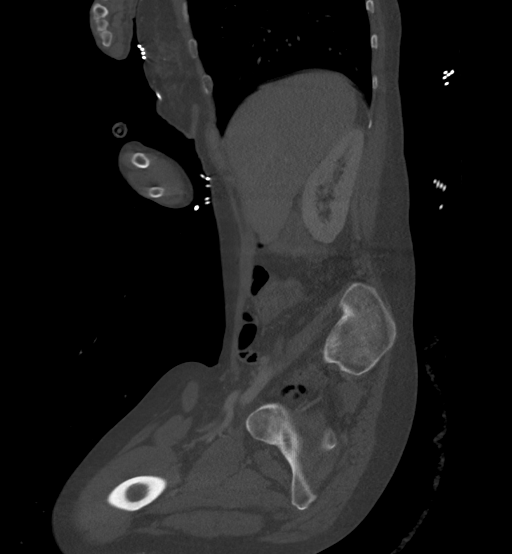

[13 of 36 positions shown; findings below may reference images not displayed]

RADIATION DOSE REDUCTION: This exam was performed according to the
departmental dose-optimization program which includes automated
exposure control, adjustment of the mA and/or kV according to
patient size and/or use of iterative reconstruction technique.

CONTRAST:  100mL OMNIPAQUE IOHEXOL 300 MG/ML  SOLN
FINDINGS: Lower chest: No acute findings

Hepatobiliary: Nodular contours of the liver surface compatible with
cirrhosis. Small subcentimeter scattered hypodensities difficult to
characterize due to their small size. No biliary ductal dilatation.
Gallbladder unremarkable.

Pancreas: No focal abnormality or ductal dilatation.

Spleen: No focal abnormality.  Normal size.

Adrenals/Urinary Tract: No adrenal abnormality. No focal renal
abnormality. No stones or hydronephrosis. Urinary bladder is
unremarkable.

Stomach/Bowel: Stomach, large and small bowel grossly unremarkable.

Vascular/Lymphatic: Aortic atherosclerosis. No evidence of aneurysm
or adenopathy.

Reproductive: Uterus and adnexa unremarkable. No mass. Bilateral
tubal ligation clips noted.

Other: No free fluid or free air.

Musculoskeletal: No acute bony abnormality.
IMPRESSION: Changes of cirrhosis. Tiny scattered hypodensities in the liver less
than 1 cm, difficult to characterize due to their small size.

No acute findings.

Aortic atherosclerosis.
